# Patient Record
Sex: Male | Born: 1940 | Race: Black or African American | Hispanic: No | Smoking: Former smoker
Health system: Southern US, Community
[De-identification: ages and names within clinical notes are randomized; demographics above are authoritative.]

## PROBLEM LIST (undated history)

## (undated) DIAGNOSIS — I739 Peripheral vascular disease, unspecified: Secondary | ICD-10-CM

## (undated) DIAGNOSIS — F3289 Other specified depressive episodes: Secondary | ICD-10-CM

## (undated) DIAGNOSIS — E7800 Pure hypercholesterolemia, unspecified: Secondary | ICD-10-CM

## (undated) DIAGNOSIS — E559 Vitamin D deficiency, unspecified: Secondary | ICD-10-CM

## (undated) DIAGNOSIS — N401 Enlarged prostate with lower urinary tract symptoms: Secondary | ICD-10-CM

## (undated) DIAGNOSIS — N183 Chronic kidney disease, stage 3 unspecified: Secondary | ICD-10-CM

## (undated) DIAGNOSIS — N138 Other obstructive and reflux uropathy: Secondary | ICD-10-CM

## (undated) DIAGNOSIS — I5032 Chronic diastolic (congestive) heart failure: Secondary | ICD-10-CM

## (undated) DIAGNOSIS — R972 Elevated prostate specific antigen [PSA]: Secondary | ICD-10-CM

## (undated) DIAGNOSIS — I10 Essential (primary) hypertension: Secondary | ICD-10-CM

## (undated) DIAGNOSIS — D649 Anemia, unspecified: Secondary | ICD-10-CM

## (undated) DIAGNOSIS — I69951 Hemiplegia and hemiparesis following unspecified cerebrovascular disease affecting right dominant side: Secondary | ICD-10-CM

## (undated) DIAGNOSIS — I6529 Occlusion and stenosis of unspecified carotid artery: Secondary | ICD-10-CM

## (undated) DIAGNOSIS — K0510 Chronic gingivitis, plaque induced: Secondary | ICD-10-CM

## (undated) DIAGNOSIS — I6990 Unspecified sequelae of unspecified cerebrovascular disease: Secondary | ICD-10-CM

## (undated) DIAGNOSIS — R3129 Other microscopic hematuria: Secondary | ICD-10-CM

## (undated) DIAGNOSIS — E785 Hyperlipidemia, unspecified: Secondary | ICD-10-CM

## (undated) DIAGNOSIS — F329 Major depressive disorder, single episode, unspecified: Secondary | ICD-10-CM

## (undated) HISTORY — PX: CATARACT EXTRACTION: SUR2

## (undated) HISTORY — DX: Other specified depressive episodes: F32.89

## (undated) HISTORY — DX: Chronic diastolic (congestive) heart failure: I50.32

## (undated) HISTORY — DX: Anemia, unspecified: D64.9

## (undated) HISTORY — DX: Elevated prostate specific antigen (PSA): R97.20

## (undated) HISTORY — DX: Benign prostatic hyperplasia with lower urinary tract symptoms: N40.1

## (undated) HISTORY — PX: CORONARY STENT PLACEMENT: SHX1402

## (undated) HISTORY — DX: Hyperlipidemia, unspecified: E78.5

## (undated) HISTORY — DX: Vitamin D deficiency, unspecified: E55.9

## (undated) HISTORY — DX: Chronic kidney disease, stage 3 (moderate): N18.3

## (undated) HISTORY — DX: Pure hypercholesterolemia, unspecified: E78.00

## (undated) HISTORY — DX: Peripheral vascular disease, unspecified: I73.9

## (undated) HISTORY — DX: Other obstructive and reflux uropathy: N13.8

## (undated) HISTORY — DX: Other microscopic hematuria: R31.29

## (undated) HISTORY — DX: Unspecified sequelae of unspecified cerebrovascular disease: I69.90

## (undated) HISTORY — DX: Essential (primary) hypertension: I10

## (undated) HISTORY — DX: Occlusion and stenosis of unspecified carotid artery: I65.29

## (undated) HISTORY — DX: Chronic kidney disease, stage 3 unspecified: N18.30

## (undated) HISTORY — DX: Major depressive disorder, single episode, unspecified: F32.9

## (undated) HISTORY — PX: EYE SURGERY: SHX253

## (undated) HISTORY — DX: Chronic gingivitis, plaque induced: K05.10

---
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
## 1999-11-21 ENCOUNTER — Other Ambulatory Visit: Admission: RE | Admit: 1999-11-21 | Discharge: 1999-11-21 | Payer: Self-pay | Admitting: Urology

## 2006-12-31 ENCOUNTER — Ambulatory Visit: Payer: Self-pay | Admitting: Cardiology

## 2006-12-31 ENCOUNTER — Inpatient Hospital Stay (HOSPITAL_COMMUNITY): Admission: AD | Admit: 2006-12-31 | Discharge: 2007-01-05 | Payer: Self-pay | Admitting: Emergency Medicine

## 2006-12-31 ENCOUNTER — Emergency Department (HOSPITAL_COMMUNITY): Admission: EM | Admit: 2006-12-31 | Discharge: 2006-12-31 | Payer: Self-pay | Admitting: Family Medicine

## 2007-01-02 ENCOUNTER — Encounter (INDEPENDENT_AMBULATORY_CARE_PROVIDER_SITE_OTHER): Payer: Self-pay | Admitting: Neurology

## 2007-01-11 ENCOUNTER — Encounter: Payer: Self-pay | Admitting: Neurology

## 2007-01-25 ENCOUNTER — Inpatient Hospital Stay (HOSPITAL_COMMUNITY): Admission: RE | Admit: 2007-01-25 | Discharge: 2007-01-26 | Payer: Self-pay | Admitting: Interventional Radiology

## 2007-02-01 ENCOUNTER — Encounter: Payer: Self-pay | Admitting: Neurology

## 2007-02-10 ENCOUNTER — Encounter: Payer: Self-pay | Admitting: Interventional Radiology

## 2007-03-02 ENCOUNTER — Inpatient Hospital Stay (HOSPITAL_COMMUNITY): Admission: AD | Admit: 2007-03-02 | Discharge: 2007-03-03 | Payer: Self-pay | Admitting: Interventional Radiology

## 2007-03-16 ENCOUNTER — Encounter: Payer: Self-pay | Admitting: Interventional Radiology

## 2007-05-16 ENCOUNTER — Encounter: Payer: Self-pay | Admitting: Neurology

## 2007-06-04 ENCOUNTER — Encounter: Payer: Self-pay | Admitting: Neurology

## 2007-07-04 ENCOUNTER — Encounter: Payer: Self-pay | Admitting: Neurology

## 2007-07-08 ENCOUNTER — Ambulatory Visit (HOSPITAL_COMMUNITY): Admission: RE | Admit: 2007-07-08 | Discharge: 2007-07-08 | Payer: Self-pay | Admitting: Interventional Radiology

## 2007-08-04 ENCOUNTER — Encounter: Payer: Self-pay | Admitting: Neurology

## 2008-03-20 ENCOUNTER — Ambulatory Visit (HOSPITAL_COMMUNITY): Admission: RE | Admit: 2008-03-20 | Discharge: 2008-03-20 | Payer: Self-pay | Admitting: Interventional Radiology

## 2008-06-19 ENCOUNTER — Ambulatory Visit (HOSPITAL_COMMUNITY): Admission: RE | Admit: 2008-06-19 | Discharge: 2008-06-20 | Payer: Self-pay | Admitting: Ophthalmology

## 2008-12-05 ENCOUNTER — Ambulatory Visit (HOSPITAL_COMMUNITY): Admission: RE | Admit: 2008-12-05 | Discharge: 2008-12-05 | Payer: Self-pay | Admitting: Interventional Radiology

## 2009-01-12 IMAGING — CR DG CHEST 2V
2 series · 2 of 2 positions shown · non-contrast
Comparison: none

CLINICAL DATA: Right hand weakness

Chest 2 view:
Comparison 12/31/2006. There is mild central peribronchial thickening. No
confluent peripheral airspace infiltrate. Heart size normal. No effusion.
Visualized upper abdomen unremarkable. Visualized bones unremarkable.

[w chest pa]
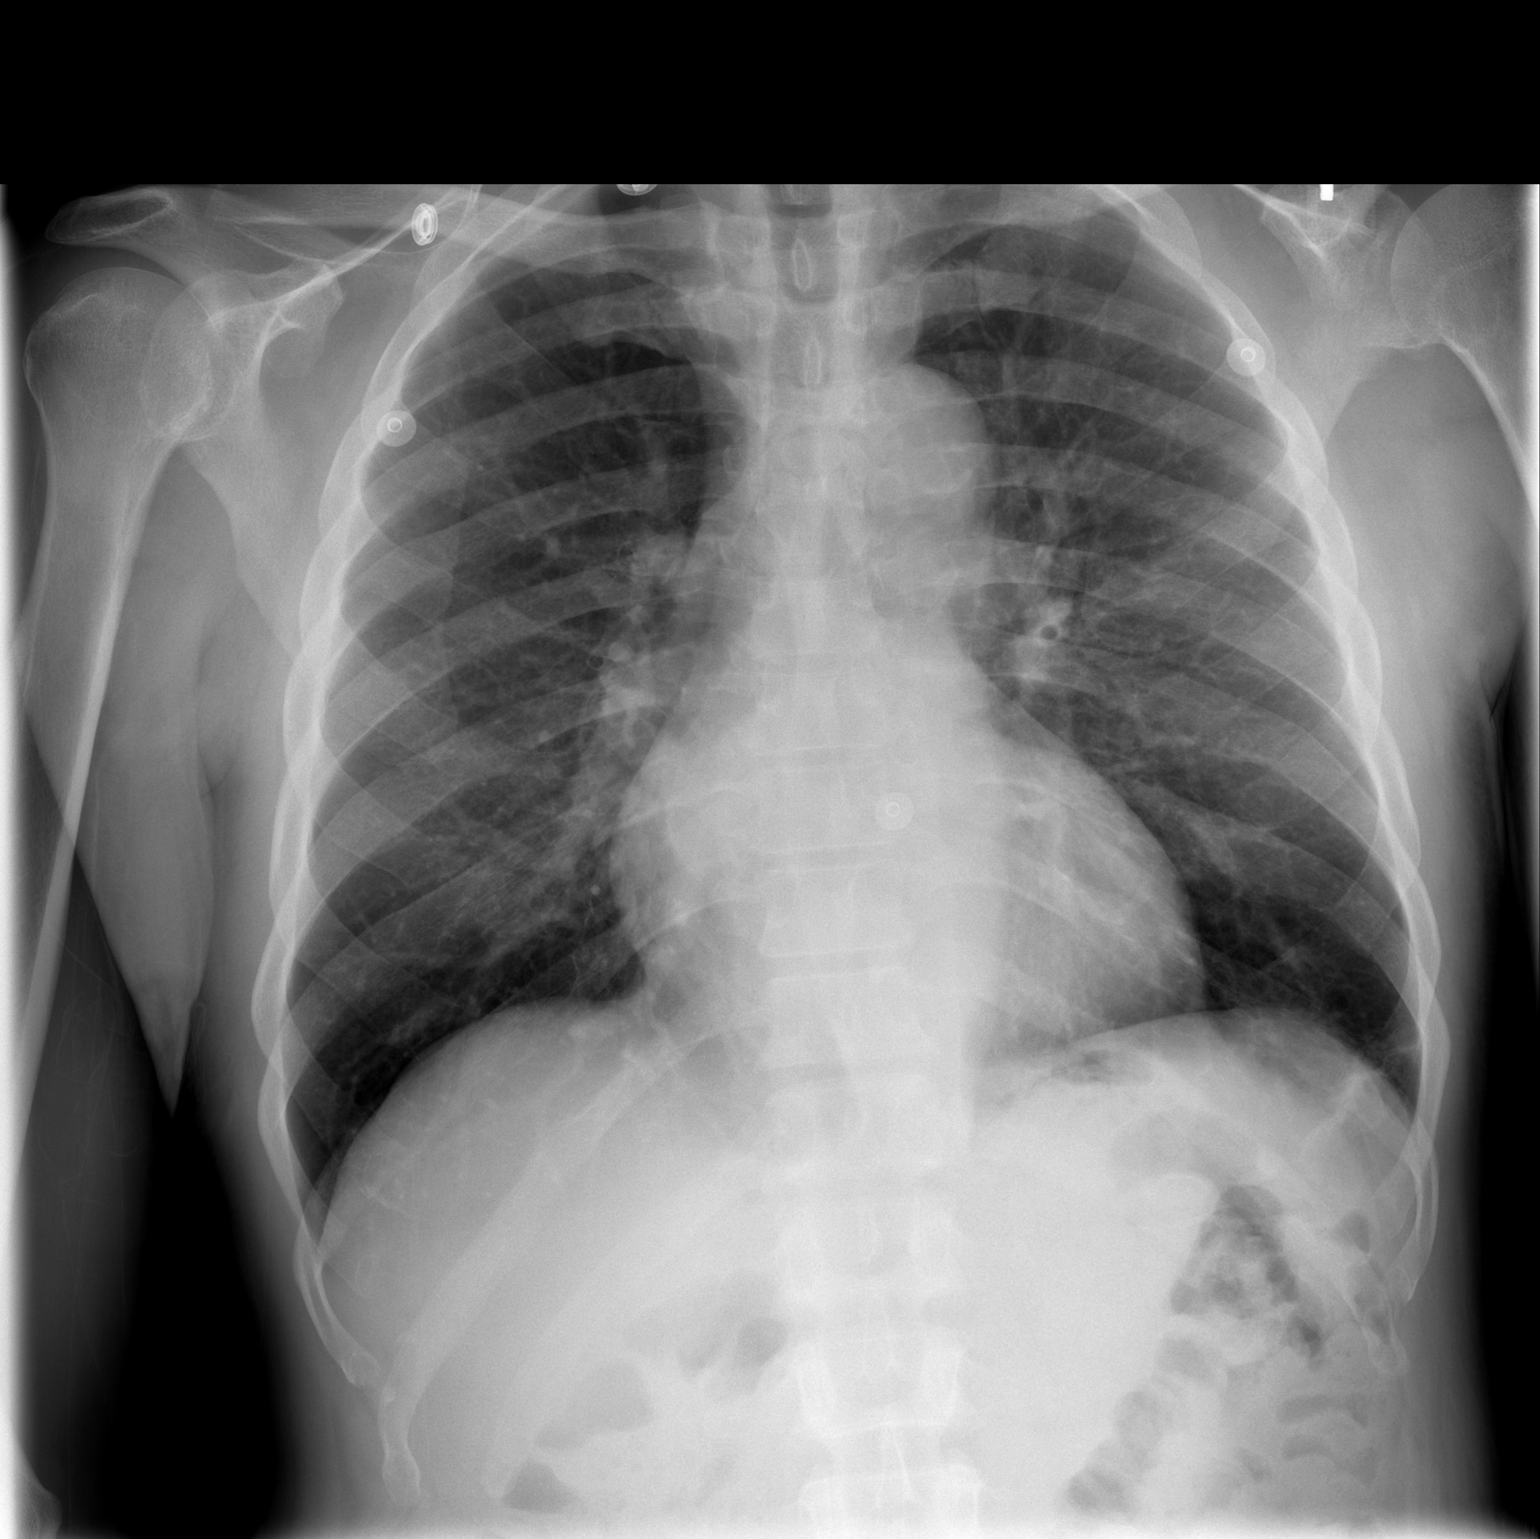

[w chest lat]
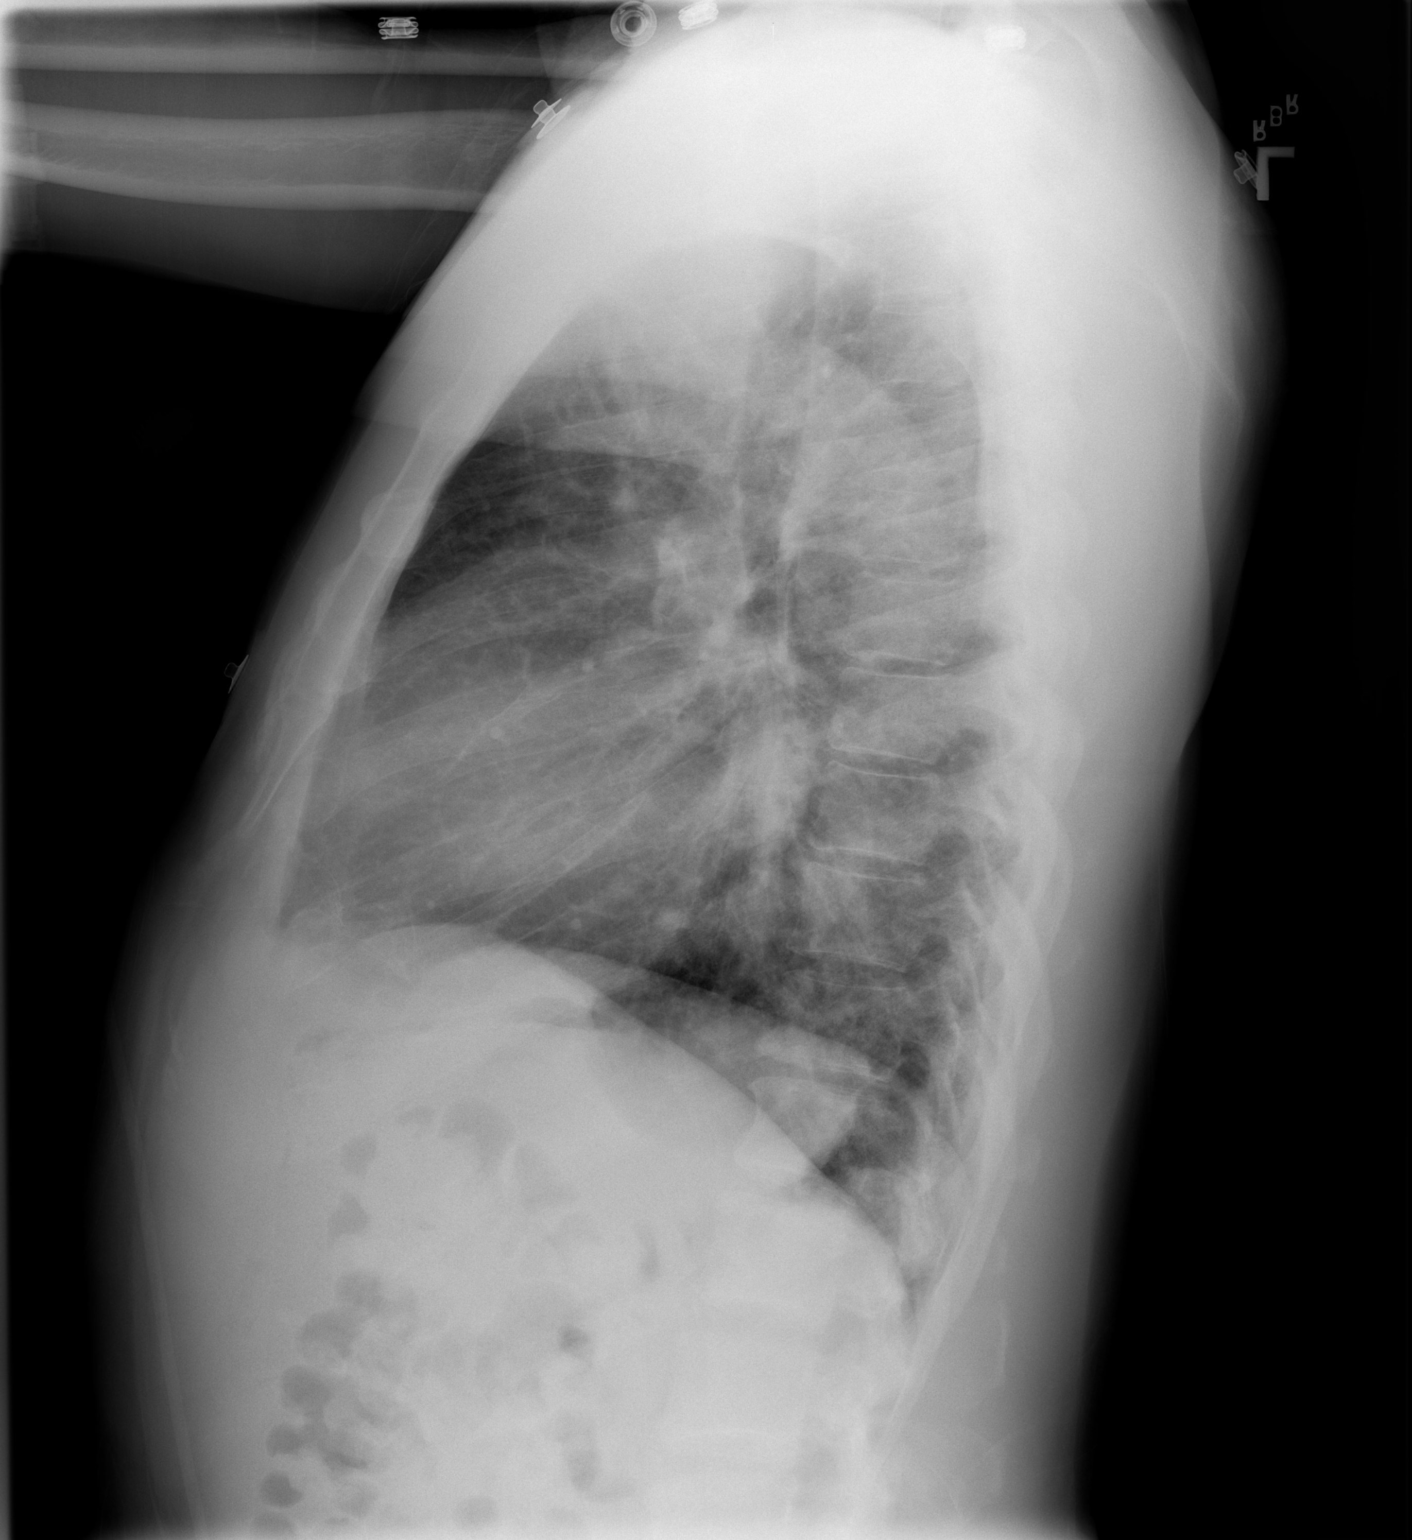

[2 of 2 positions shown; findings below may reference images not displayed]

IMPRESSION: 1. Central peribronchial thickening suggesting bronchitis, asthma, or viral
syndrome.

## 2009-01-12 IMAGING — CT CT CHEST W/ CM
4 of 7 series · 13 of 46 positions shown, 18 images · IV contrast (omnipaque)
Comparison: None relevant.

CLINICAL DATA: Right hand weakness and anemia.   Brain lesion.  Question metastatic disease.
CHEST CT WITH CONTRAST:
TECHNIQUE: Multidetector CT imaging of the chest was performed following the standard protocol during bolus administration of intravenous contrast.
Contrast:  100 cc Omnipaque 300.  Oral contrast was given.
TECHNIQUE: Multidetector CT imaging of the abdomen was performed following the standard protocol during bolus administration of intravenous contrast.
TECHNIQUE: Multidetector CT imaging of the pelvis was performed following the standard protocol during bolus administration of intravenous contrast.

[Series 2: chest/abd/pelvis · axial · 0.82mm/px · z∈[-614,-141]mm · 8 of 150 slices shown]
[im 16/150  soft-tissue]
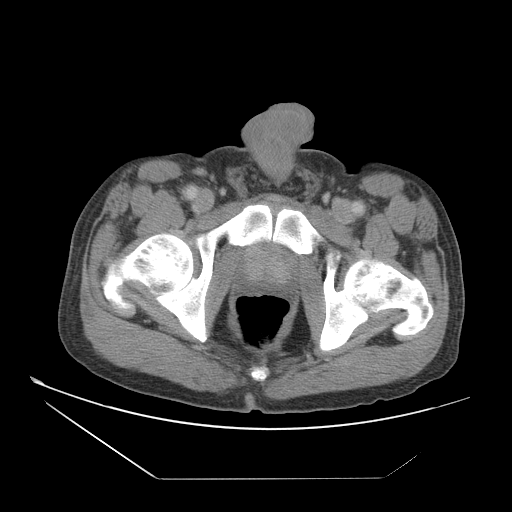
[im 32/150  soft-tissue]
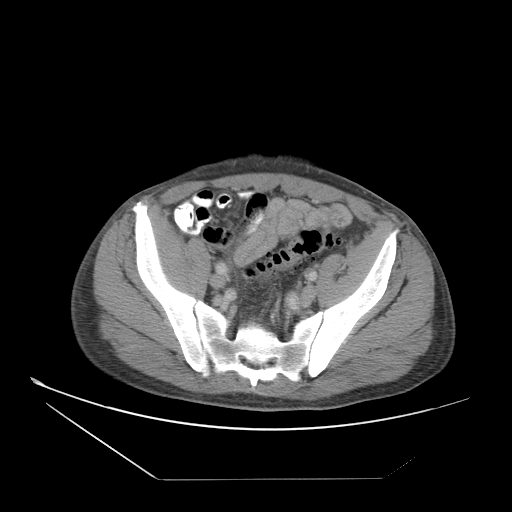
[im 48/150  soft-tissue]
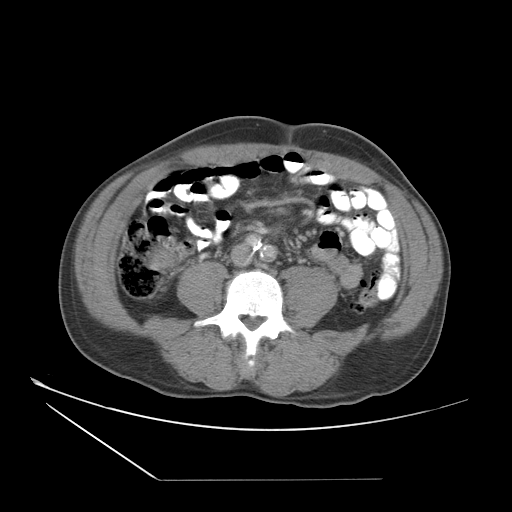
[im 63/150  soft-tissue]
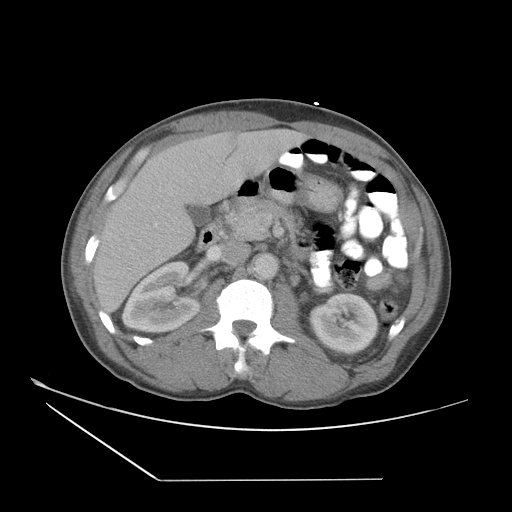
[im 87/150  soft-tissue]
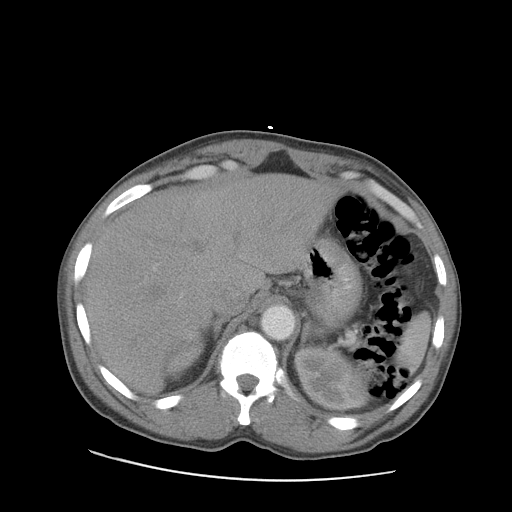
[im 102/150  soft-tissue]
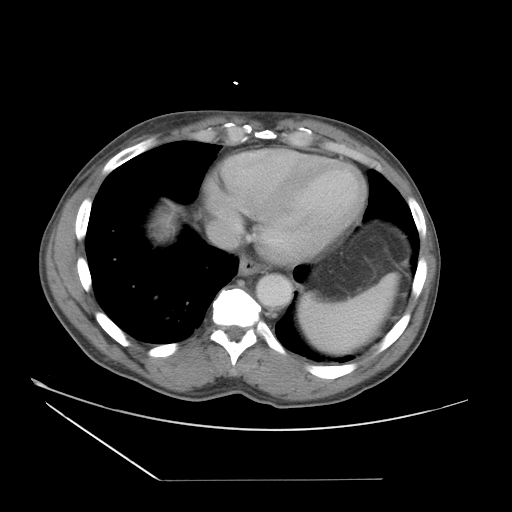
[im 118/150  soft-tissue]
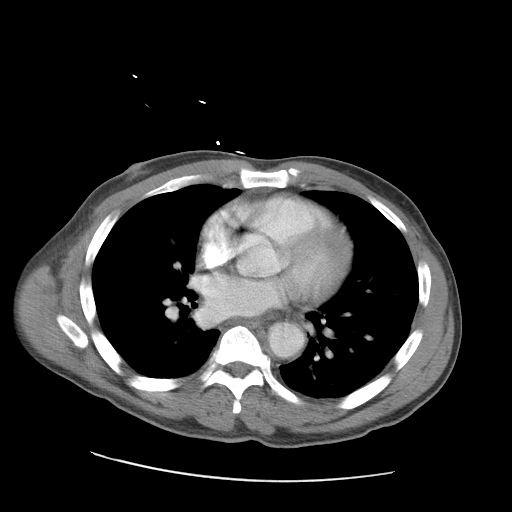
[im 134/150  soft-tissue]
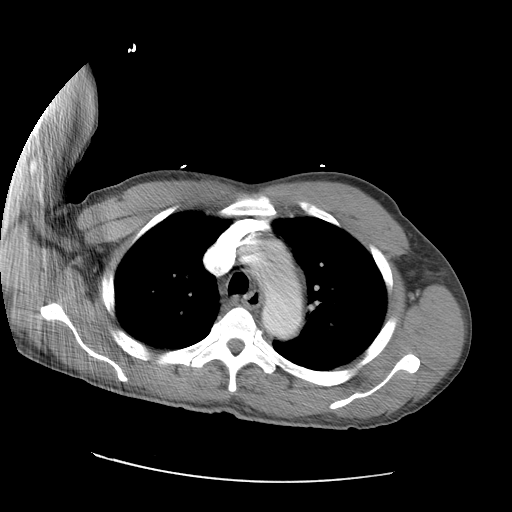

[Series 5: renal delays · axial · 0.70mm/px · z∈[-389,-334]mm · 2 of 34 slices shown, 5 images]
[im 12/34  soft-tissue]
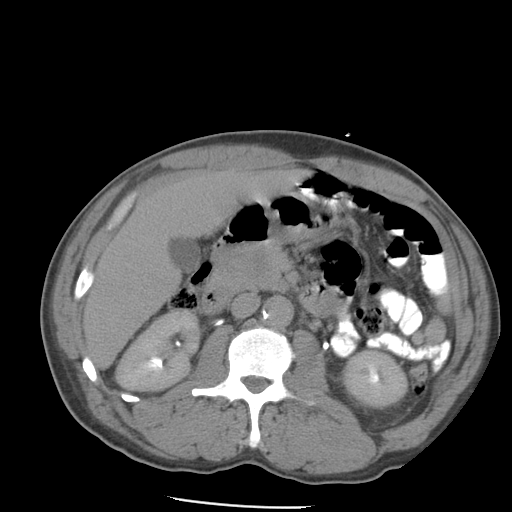
[im 12/34  lung]
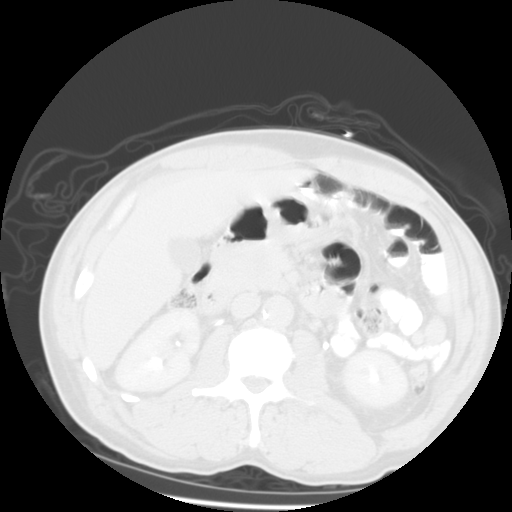
[im 12/34  bone]
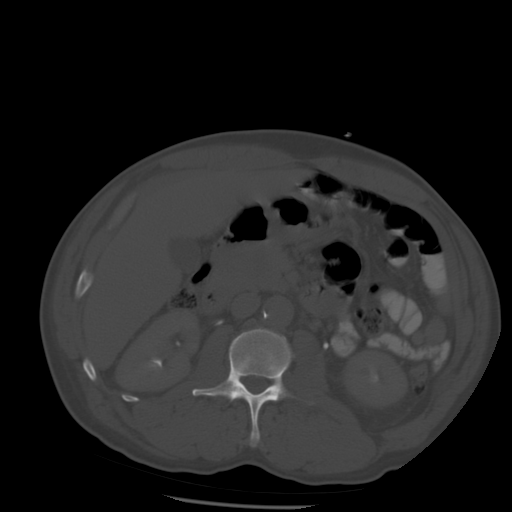
[im 23/34  soft-tissue]
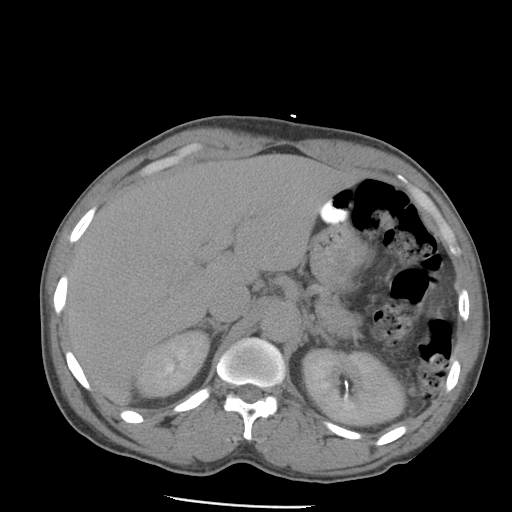
[im 23/34  lung]
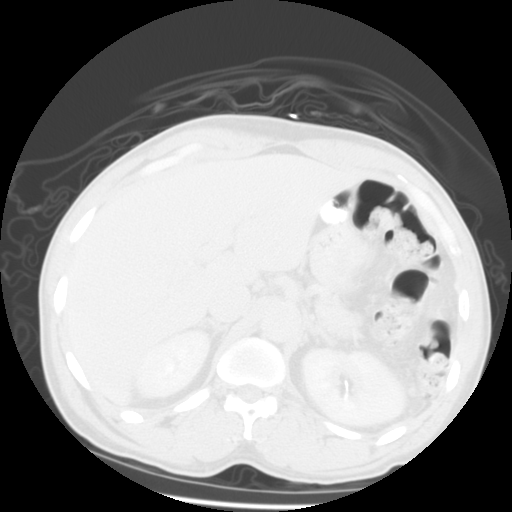

[Series 401: sagittals · sagittal · 0.78mm/px · 2 of 85 slices shown, 3 images]
[im 29/85  soft-tissue]
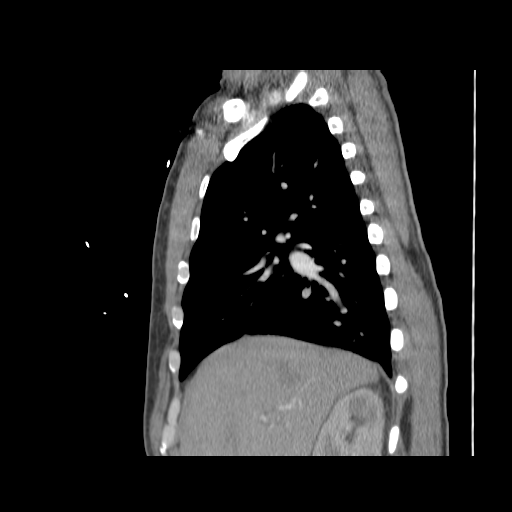
[im 29/85  bone]
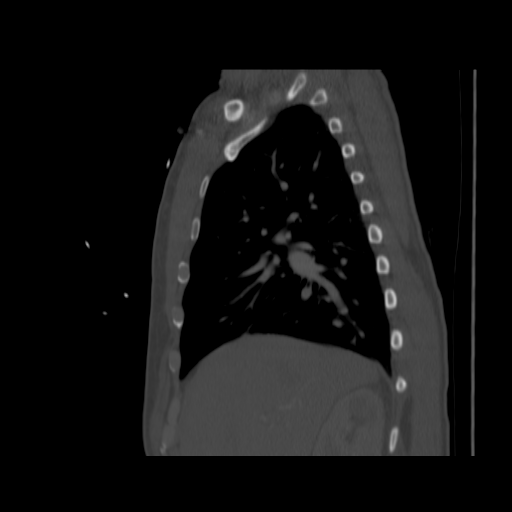
[im 57/85  soft-tissue]
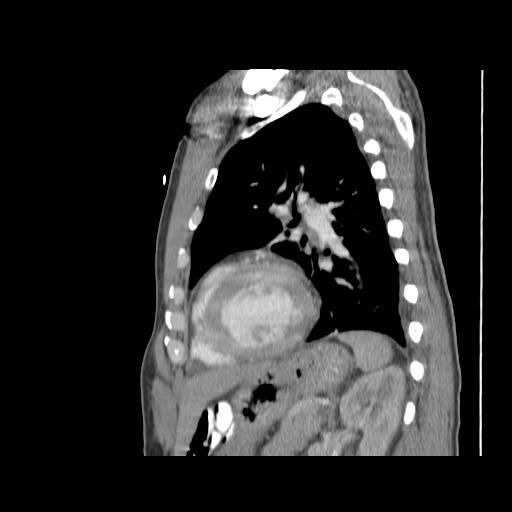

[Series 403: abd sagittals · sagittal · 0.90mm/px · 1 of 87 slices shown, 2 images]
[im 29/87  soft-tissue]
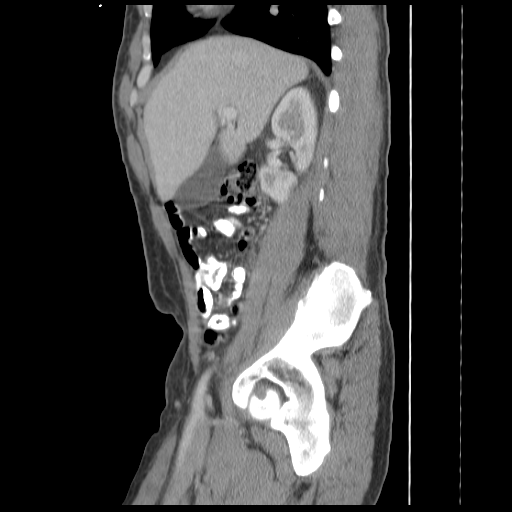
[im 29/87  bone]
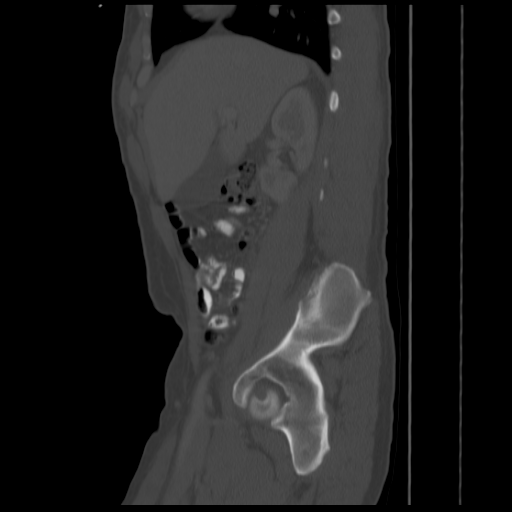

[13 of 46 positions shown; findings below may reference images not displayed]

FINDINGS: There is a prominent AP window node on image #19 with a short axis dimension of 9 mm.  No enlarged mediastinal or hilar lymph nodes are present.  There is no pleural or pericardial effusion.  The lungs are clear aside from mild atelectasis in the left lower lobe.  There is no mass or endobronchial lesion.
IMPRESSION: No evidence of intrathoracic primary tumor or metastatic disease.  Mild left lower lobe atelectasis. 
ABDOMEN CT WITH CONTRAST:
FINDINGS: There is a small hiatal hernia.  There is aortic atherosclerosis without focal aneurysm.  The liver, spleen, gallbladder, pancreas, adrenal glands, and kidneys demonstrate no suspicious findings.  There is no adenopathy or ascites.  A well-circumscribed lucent lesion is noted in the L3 spinous process.  There are no other suspicious osseous findings.
IMPRESSION: 1.  No evidence of intraabdominal primary malignancy or typical metastatic disease.
2.  A nonspecific lucent lesion is present in the L3 spinous process, potentially a metastasis, but probably incidental.  No other osseous abnormalities are seen.  
PELVIS CT WITH CONTRAST:
FINDINGS: No pelvic mass, fluid collection, or inflammatory process is seen.  The prostate gland is moderately enlarged.  A sclerotic lesion in the left iliac wing on image #113 is noted.  There are no lytic lesions or acute osseous findings.
IMPRESSION: 1.  No evidence of intrapelvic malignancy.  
2.  Moderate enlargement of the prostate gland. 
3.  Nonspecific sclerotic lesion in the left iliac wing may reflect a bone island.

## 2009-09-19 ENCOUNTER — Ambulatory Visit (HOSPITAL_COMMUNITY): Admission: RE | Admit: 2009-09-19 | Discharge: 2009-09-19 | Payer: Self-pay | Admitting: Interventional Radiology

## 2010-08-24 ENCOUNTER — Encounter: Payer: Self-pay | Admitting: Interventional Radiology

## 2010-10-22 LAB — CBC
MCHC: 33 g/dL (ref 30.0–36.0)
MCV: 84.8 fL (ref 78.0–100.0)
Platelets: 277 10*3/uL (ref 150–400)
RBC: 4.59 MIL/uL (ref 4.22–5.81)

## 2010-10-22 LAB — BASIC METABOLIC PANEL
BUN: 18 mg/dL (ref 6–23)
CO2: 26 mEq/L (ref 19–32)
Calcium: 9.6 mg/dL (ref 8.4–10.5)
Creatinine, Ser: 1.18 mg/dL (ref 0.4–1.5)
GFR calc Af Amer: >60 mL/min (ref >60)

## 2010-10-22 LAB — PROTIME-INR
INR: 1.02 (ref 0.00–1.49)
Prothrombin Time: 13.3 seconds (ref 11.6–15.2)

## 2010-11-11 ENCOUNTER — Other Ambulatory Visit: Payer: Self-pay | Admitting: Internal Medicine

## 2010-11-11 DIAGNOSIS — I10 Essential (primary) hypertension: Secondary | ICD-10-CM

## 2010-11-11 LAB — BASIC METABOLIC PANEL
BUN: 18 mg/dL (ref 6–23)
CO2: 27 mEq/L (ref 19–32)
Calcium: 9.8 mg/dL (ref 8.4–10.5)
Creatinine, Ser: 1.15 mg/dL (ref 0.4–1.5)
GFR calc non Af Amer: >60 mL/min (ref >60)
Glucose, Bld: 91 mg/dL (ref 70–99)
Sodium: 141 mEq/L (ref 135–145)

## 2010-11-11 LAB — CBC
Hemoglobin: 11.7 g/dL — ABNORMAL LOW (ref 13.0–17.0)
MCHC: 32.5 g/dL (ref 30.0–36.0)
Platelets: 299 10*3/uL (ref 150–400)
RDW: 16.8 % — ABNORMAL HIGH (ref 11.5–15.5)

## 2010-11-11 LAB — PROTIME-INR
INR: 1 (ref 0.00–1.49)
Prothrombin Time: 13.7 seconds (ref 11.6–15.2)

## 2010-11-11 LAB — APTT: aPTT: 60 seconds — ABNORMAL HIGH (ref 24–37)

## 2010-11-12 ENCOUNTER — Ambulatory Visit
Admission: RE | Admit: 2010-11-12 | Discharge: 2010-11-12 | Disposition: A | Discharge status: Home / Self Care | Payer: Medicare Other | Source: Ambulatory Visit | Attending: Internal Medicine | Admitting: Internal Medicine

## 2010-11-12 DIAGNOSIS — I10 Essential (primary) hypertension: Secondary | ICD-10-CM

## 2010-12-16 NOTE — Op Note (Signed)
NAMEGIRISH, [name]               ACCOUNT NO.:  [id number]   MEDICAL RECORD NO.:  [id number]          PATIENT TYPE:  OIB   LOCATION:  5121                         FACILITY:  [facility]   PHYSICIAN:  Chrystie Nose. Zigmund Daniel, M.D. DATE OF BIRTH:  1940/12/14   DATE OF PROCEDURE:  06/19/2008  DATE OF DISCHARGE:                               OPERATIVE REPORT   ADMISSION DIAGNOSIS:  Macular hole, left eye.   PROCEDURES:  Pars plana vitrectomy with membrane peel, retinal  photocoagulation, gas fluid exchange, serum patch, and Perfluoron  propane injection in the left eye.   SURGEON:  Chrystie Nose. Zigmund Daniel, MD   ASSISTANT:  Deatra Ina, MA   ANESTHESIA:  General.   DETAILS:  As usual prep and drape, conjunctival peritomy at 10, 2, and 4  o'clock.  Infusion port at 4 o'clock.  The lighted pick and the cutter  were placed at 10 and 2 o'clock respectively.  Contact lens were  anchored into place at 6 and 12 o'clock.  Provisc placed on the cornea  and the flat contact lens was placed.  Pars plana vitrectomy was begun  just behind the crystalline lens.  Vitreous membranes were encountered  and carefully removed under low suction and rapid cutting.  The  vitrectomy was carried down to the macular surface where a preretinal  membrane was seen.  The lighted pick was used to engage the preretinal  membrane.  The silicone tip suction line was drawn down into the  posterior pole and the Fish strike sign occurred.  Posterior membranes  attached to the retina, and the edge of the macular hole were peeled  with ILM forceps, diamond dusted membrane scraper and silicone tip  suction line, and lighted pick.  Once the posterior hyaloid was removed,  additional vitrectomy was carried out removing membranes from the disk  and the great arcades.  The vitrectomy was carried out of the far  periphery with a 30-degree prismatic lens.  The magnifying contact lens  was repositioned on the eye.  The diamond dusted membrane  scraper was  used to treat the edge of the macular hole and free it from its  attachments to the ILM.  Additional peeling with ILM forceps was  performed.  The indirect ophthalmoscope laser was moved into place, 610  burns placed around the retinal periphery with a power of 400  milliwatts, 1000 microns each and 0.1 seconds each.  A 100% gas fluid  exchange was performed.  The sufficient time was allowed to pass so that  fluid tracked down the walls of the eye and collected in the posterior  segment.  The serum patch and the Perfluoron propane was mixed to 16%.  During this time, additional fluid was removed with a [place] [employer]  brush.  The serum patch was delivered and the C3F8 16% was exchanged for  intravitreal gas.  The instruments were removed from the eye and 9-0  nylon was used to close the sclerotomy sites.  The conjunctiva was  closed with wet-field cautery.  Polymyxin and gentamicin were irrigated  in tenon space.  Atropine solution  was applied.  Marcaine was injected  around the globe for postop pain.  Decadron 10  mg was injected to the lower subconjunctival space.  Closing pressure  was 10 with a Barraquer tonometer.  Polysporin ophthalmic ointment, the  patch, and shield were placed.  The patient was awakened and taken to  recovery in satisfactory condition.  Complications none.  Duration 1  hour.      Chrystie Nose. Zigmund Daniel, M.D.  Electronically Signed     JDM/MEDQ  D:  06/19/2008  T:  06/20/2008  Job:  [id number]

## 2010-12-16 NOTE — H&P (Signed)
[name], [name]               ACCOUNT NO.:  [id number]   MEDICAL RECORD NO.:  [id number]          PATIENT TYPE:  OIB   LOCATION:  3172                         FACILITY:  [facility]   PHYSICIAN:  Sanjeev K. Deveshwar, M.D.DATE OF BIRTH:  [birth year]   DATE OF ADMISSION:  03/02/2007  DATE OF DISCHARGE:                              HISTORY & PHYSICAL   CHIEF COMPLAINT:  Cerebrovascular disease.   HISTORY OF PRESENT ILLNESS:  This is a pleasant 70 year old male who was  admitted to [facility] Dec 31, 2006 to January 05, 2007 with right  sided weakness.  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  He  had a cerebral angiogram performed by Dr. Estanislado Pandy during that  admission that showed a 90 to 95% left internal carotid artery stenosis  as well as a 90% right internal carotid artery stenosis.  The patient  had a 2-D echocardiogram that showed no source of emboli with an  ejection fraction of 55 to 65% and mild aortic regurgitation. He also  had a TEE performed with similar findings.  The patient underwent left  internal carotid artery PTA stenting by Dr. Estanislado Pandy on January 25, 2007.  He was seen in followup on February 10, 2007 and arrangements were made to  have the patient return today, March 02, 2007 for a repeat cerebral  angiogram and possible PTA stenting of the right internal carotid  artery.  These lesions were intracranial lesions in both carotid  arteries.   PAST MEDICAL HISTORY:  Significant for the above-noted CVA's with  bilateral internal carotid artery stenosis intracranially. He has a  history of an ejection fraction of 55 to 65% by 2-D echocardiogram with  mild AR.  He has a history of iron deficiency anemia and weight loss of  uncertain etiology.  He was worked up for malignancy during his initial  hospitalization in May, however, there were no significant findings.  The patient was also noted to have an elevated PSA.  He was seen in  consultation by Dr. Terance Hart  and found to have BPH.  Further work up was  recommended.  I do not believe this has been further evaluated.  The  patient does not have a primary care physician.  He was encouraged to  obtain one but to this point has not succeeded.  The patient had a bone  scan January 03, 2007 that was within normal limits.  He also had an  elevated hemoglobin A1C, an elevated sed rate and elevated PTT of  uncertain etiology.  He was started on antidepressants during his stroke  admission.  He also has a history of dyslipidemia, hypertension, and  disability secondary to chronic back pain.   PAST SURGICAL HISTORY:  The patient has had no major surgeries.   ALLERGIES:  PENICILLIN.   MEDICATIONS:  His medications include aspirin, Plavix,  hydrochlorothiazide, Celexa and Zocor.   SOCIAL HISTORY:  The patient is divorced.  He has two grown children.  He was living in [place] with his girlfriend.  He also has a home in  [place].  He has a  very supportive daughter.  He does not use alcohol  or tobacco.  He has been on disability since 1994.  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   FAMILY HISTORY:  Both parents died in their 51's, cause unknown.   REVIEW OF SYSTEMS:  Is completely negative except for the following:  The patient has had a chronic cough which has been nonproductive.  He  has residual right sided weakness from his stroke, although he feels  this is improving.   LABORATORY DATA:  An INR is 1.2.  PTT is 73.  Hemoglobin 9.9, hematocrit  30.8, white blood cell count 7,000, platelet count 530,000.  BUN 17,  creatinine 0.84.  The potassium is 4.3. GFR greater than 60.  Glucose  158.  Sodium 134.   PHYSICAL EXAMINATION:  GENERAL APPEARANCE:  Physical examination reveals  a 70 year old male seated in a chair in no acute distress.  VITAL SIGNS:  The vital signs are currently pending.  HEENT:  The patient has a very mild oral droop.  His tongue deviates to  the left.  NECK:  Reveals no  bruits.  HEART:  Reveals a regular rate and rhythm with an occasional ectopic.  No murmur was appreciated.  LUNGS:  Decreased but clear.  ABDOMEN:  Nontender.  EXTREMITIES:  Reveal pulses to be weak to absent.  There was no  significant edema.  NEUROLOGICAL:  On mental status exam the patient is alert and oriented  and follows commands.  Cranial nerves II-XII are grossly intact except  for some very mild oral weakness.  Motor strength is 4/5 in the right  upper extremity and 4-5/5 in the right lower extremity.  The left side  is 5/5.  Cerebellar testing was intact, although he had some mild  difficulty with finger-to-nose testing on the right.  His airway is  rated at a 3.  His ASA scale is a 3.   IMPRESSION:  1. Bilateral carotid artery stenosis, status post left internal      carotid artery stent placement performed by Dr. Estanislado Pandy on January 25, 2007.  2. Plans for right internal carotid artery percutaneous transluminal      angioplasty stenting.  3. History of bilateral cerebral infarcts.  4. History of a 2-dimensional echocardiogram revealing no source of      emboli with an ejection fraction of 55 to 65% with  mild aortic      regurgitation.  5. History of anemia of uncertain etiology.  6. History of weight loss.  7. Elevated PTT.  8. Elevated hemoglobin A1C with elevated glucose levels.  9. History of depression.  10.Dyslipidemia.  11.Hypertension.  12.Chronic back pain resulting in disability.  13.Elevated PSA with benign prostatic hypertrophy.  14.Elevated sed rate.  15.PENICILLIN ALLERGY.   PLAN:  As noted, the patient is here for further evaluation of his  cerebrovascular disease.  The plan is to proceed with a cerebral  angiogram under conscious sedation.  Based on these results, the patient  may also undergo PTA stenting of the right internal carotid artery, if  felt to be safe and indicated.      [name], P.A.     ______________________________  [name]. Estanislado Pandy, M.D.    DR/MEDQ  D:  03/02/2007  T:  03/02/2007  Job:  [id number]   cc:   Pramod P. Leonie Man, MD

## 2010-12-16 NOTE — Discharge Summary (Signed)
[name], [name]               ACCOUNT NO.:  [id number]   MEDICAL RECORD NO.:  [id number]          PATIENT TYPE:  INP   LOCATION:  3107                         FACILITY:  [facility]   PHYSICIAN:  Sanjeev K. Deveshwar, M.D.DATE OF BIRTH:  [birth year]   DATE OF ADMISSION:  01/25/2007  DATE OF DISCHARGE:  01/26/2007                               DISCHARGE SUMMARY   CHIEF COMPLAINT:  Status post PTA stenting of a left internal carotid  artery stenosis performed January 25, 2007.   HISTORY OF PRESENT ILLNESS:  This is a 70 year old male who was admitted  to [facility] Dec 31, 2006, to January 05, 2007, with right-sided  weakness.  An MRI was performed and was consistent with bilateral  cerebral infarcts a cerebral angiogram was performed on January 03, 2007, by  Dr. Estanislado Pandy that showed a 90-95% left internal carotid artery stenosis  as well as a 90% right internal carotid artery stenosis.   During the admission, the patient had a 2-D echocardiogram and a TEE  performed.  These showed no source of emboli.  His ejection fraction was  estimated to be 55-65%.  He did have some mild aortic regurgitation.  Also, during that stay, the patient was noted to have iron deficiency  anemia with a recent history of weight loss.  There was concern for  possible occult malignancy.  The patient was also noted to have an  elevated PSA and BPH.  A urology consult was obtained from Dr. Terance Hart  with further evaluation recommended in the future.  The patient had a  bone scan on January 03, 2007, that was within normal limits.  He also had  an elevated hemoglobin A1c.  He reported some depression and was placed  on an antidepressant medication.  He also had an elevated sed rate of  140 and an elevated PTT.   The patient was discharged from [facility] on January 05, 2007.  He was  referred to Dr. Estanislado Pandy through the courtesy of Dr. Leonie Man to undergo  possible stenting of the left internal carotid artery.  The patient  was  admitted to [facility] on January 25, 2007, for the procedure.   PAST MEDICAL HISTORY:  1. Dyslipidemia.  2. History of CVAs as noted above.  3. Outpatient therapies for right upper extremity weakness.  4. Diagnosed as being hypertensive and started on hydrochlorothiazide      during his recent admission.  5. Iron deficiency anemia.  6. History of weight loss.  7. Elevated PTT of uncertain etiology, as well as an elevated sed rate      and PSA.  8. BPH.  9. History of back pain.  He has been on disability for his back pain.  10.He was recently diagnosed with depression and is currently being      treated.   SURGICAL HISTORY:  The patient has had no major surgeries.  He denies  ever having had general anesthesia.   ALLERGIES:  PENICILLIN.   MEDICATIONS ON ADMISSION:  1. Aspirin.  2. Plavix.  3. Hydrochlorothiazide.  4. Celexa.  5. Zocor.  SOCIAL HISTORY:  The patient is divorced.  She has two grown children.  He lives in [place] with his girlfriend.  He also has a home in  [place].  He does not use alcohol or tobacco.  He is on disability  since 1994.  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   FAMILY HISTORY:  Both parents died in their 89s, cause unknown.   HOSPITAL COURSE:  As noted, this patient was admitted to [facility] on January 25, 2007, to undergo treatment of a left internal  carotid artery stenosis.  The patient had a left internal carotid artery  stent placed under general anesthesia by Dr. Estanislado Pandy on the day of  admission.  There were no immediate or known complications.  The patient  was admitted to the neural intensive care unit where he was maintained  on IV heparin overnight.  The following day, the heparin was  discontinued the right femoral groin sheath was removed and hemostasis  was obtained.  Arrangements were made to discharge the patient later  that day in stable and improved condition.   LABORATORY DATA:  The patient was  noted be anemic on the day following  his procedure.  His hemoglobin had dropped to 7.6.  He was transfused.  His hemoglobin improved to 10.5, hematocrit 32.3 prior to discharge.  The patient was also noted to have an elevated platelet count of  468,000.  On the day of discharge, chemistry profile revealed a BUN of  13, creatinine 0.77, potassium was 3.9.  Sodium 133, glucose was 108.  GFR was greater than 60.   The patient was told to resume his medications as previously taken at  time of discharge these included aspirin, Plavix, hydrochlorothiazide,  Celexa and Zocor.   The patient was told to avoid any strenuous activity for at least 2  weeks.  He was not to drive for 2 weeks.  He was given instructions  regarding wound care.  He was to follow-up with Dr. Estanislado Pandy in  approximately 2 weeks.  He was instructed to find a primary care  physician for further evaluation of his medical issues.   PROBLEMS:  At time of discharge:  1. Left internal carotid artery stenosis status post PTA stenting      performed January 25, 2007.  2. Residual right internal carotid artery stenosis to be stented in      the near future.  3. Recent bilateral cerebrovascular accidents.  4. 2-D echo and TEE showing no source of emboli with ejection fraction      of 55-65% and mild AR.  5. History of iron deficiency anemia requiring transfusion this      admission.  6. History of weight loss.  7. History of elevated hemoglobin A1c.  8. Dyslipidemia.  9. Elevated PSA and benign prostatic hypertrophy with further      evaluation recommended.  10.Chronic back pain on disability and hypertension.  11.History of depression.  12.Elevated PTT and elevated sed rate of uncertain etiology.  13.History of allergy to penicillin.  14.Elevated platelet count.  15.History of remote cerebral cerebrovascular accident as well mildly      elevated glucose.      Mikey Bussing, P.A.    ______________________________   Fritz Pickerel. Estanislado Pandy, M.D.    DR/MEDQ  D:  02/02/2007  T:  02/02/2007  Job:  [id number]   cc:   Pramod P. Leonie Man, MD

## 2010-12-16 NOTE — Discharge Summary (Signed)
[name], [name]               ACCOUNT NO.:  [id number]   MEDICAL RECORD NO.:  [id number]          PATIENT TYPE:  EMS   LOCATION:  URG                            FACILITY:   PHYSICIAN:  UNCLEAR                DATE OF BIRTH:  15-Mar-1941   DATE OF ADMISSION:  12/31/2006  DATE OF DISCHARGE:  01/05/2007                               DISCHARGE SUMMARY   DICTATED FOR:  David P. Leonie Man, MD   DIAGNOSIS AT THE TIME OF DISCHARGE:  1. Bilateral cerebellar infarcts, large and small vessel disease, left      greater than right, without etiology, clearly identified, though it      is felt to be secondary to bilateral ICA stenosis greater than 90%      and __________  emboli.  2. 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 Juliet [name].  3. Elevated hemoglobin A1C.  4. Mild dyslipidemia.  5. Self-reported depression.   MEDICINES AT TIME OF DISCHARGE:  1. Aspirin 81 mg a day.  2. Plavix 75 mg a day.  3. Zocor 10 mg a day.  4. Hydrochlorothiazide 12.5 mg a day.  5. Celexa 10 mg a day.   STUDIES PERFORMED:  1. CT of the brain in admission shows a 7-mm area of focal      hypoattenuation, left side of the genu of the corpus callosum.      Unclear whether it is a small mass infection or ischemia.  Ill-      defined soft tissue density over left convexity in the posterior      left frontal lobe, representing possibly a small meningioma.  2. Chest x-ray shows mild cardiomegaly with pulmonary vascular      congestion.  Otherwise, negative.  3. MRI of the brain showed scattered infarctions throughout the      cerebral hemispheres, suggesting embolic disease from an ascending      aortic or cardiac source.  The largest stroke is about 2.5 cm in      size, and affects the left parietal cortex.  4. MRA of the head shows no intracranial vascular occlusion.  There is      loss of signal on both carotid siphons, so it is thought to be      artifactually based.  5. Chest  x-ray shows central peribronchial thickening, suggesting      bronchitis, asthma, or viral syndrome.  6. CT of the chest shows no intrathoracic primary tumor or metastatic      disease.  Mild left lower lobe atelectasis.  7. Abdominal CT shows no intra-abdominal primary malignancy or typical      metastatic disease.  Nonspecific lucent lesion is present in the L3      spinous process.  Potentially a metastasis, but probably      incidental.  Pelvic CT shows no intrapelvic malignancy, moderate      enlargement of the prostate planned.  Nonspecific sclerotic lesion      in the left iliac wing  may reflect a bone island.  8. Bone scan is negative.  9. Cerebral angiogram shows severe 90% to 95% severe segmental      stenosis of 90% of the right RCA and distal cavernous segment.      Partial reconstitution of the MCA from ACA via the ipsilateral      PCOM.  10.A 2-D echocardiogram shows EF of 65%, with aortic valve stenosis.      No obvious embolic source.  11.Carotid Doppler shows no RCA stenosis.  Vertebral flow is antegrade      bilaterally.  12.EKG shows normal sinus rhythm, right bundle-branch block, septal      infarct.   LABORATORY STUDIES:  CBC with hemoglobin on admission 8.0, 8.3 at the  time of discharge.  Hematocrit 25.3, white blood cells 6.7, platelets  505.  MCV is 78.3.  Chemistry with glucose 106, otherwise normal.  Coagulation study is normal.  Liver function tests normal.  Albumin 2.4.  Cardiac enzymes not performed.  Cholesterol 135, HDL 21, LDL 103, and  triglycerides 54.  Urinalysis was negative.  SPE with interpretation  shows total protein 6.6, serum albumin 35.1, alpha1 globulin 8.7, alpha  II globulin 24.9, beta globulin 4.2, beta II globulin 6.3, gamma  globulin 20.8, M-spike not detected.  There is nonspecific, diffuse,  polychromal-type increase in gamma globulin.  PSA was 22.93, sed rate  greater than 140, iron 14, total IBC 210, percent saturation 7, UIBC   196, vitamin B12 337, homocysteine 12.5, PSA initially 26.61, hemoglobin  A1C 6.9.   HISTORY OF PRESENT ILLNESS:  [name] is a 70 year old right-  handed African American married male who was admitted for evaluation of  right arm weakness and abnormal CT scan.  He has a 2-day history of  progressive weakness in his right arm and hand which got much worse this  morning.  He had a headache several days ago, for which he took 1  aspirin, but no recent headache, syncope or seizure.  He denied chest  pain or palpitations.  He notes that he was worse this morning.  He was  living in his girlfriend's house in [place] and drove himself to  [facility] Emergency Room where a CT of the brain showed a possible mass  lesion in the mid genu of the left corpus callosum and a high left  parietal lesion.  He was admitted for further evaluation.  Of note, he  has lost approximately 10 pounds in the past month.  He has no known  history of hypertension, diabetes, heart disease, or drug abuse.  Prior  history of stroke.  He has not been taking aspirin on a regular basis.  He was not a TPA candidate, secondary to time.   HOSPITAL COURSE:  MRI revealed bicerebral acute infarcts in large and  small vessel distributions.  He was found to have bilateral ICAs,  cavernous stenosis greater than 90% bilaterally.  His strokes more were  on the left than the right, and it was decided that he would probably be  a left RCA stent candidate.  He was seen by Dr. Stephani [name], who  performed a cerebral angiogram which confirmed the findings.  Plans are  to schedule him for a left RCA stent in 1-2 weeks.  They will contact  him related to the date and details, after discharge.  Of note, patient  was found to have vascular risk factors of mildly elevated hemoglobin  A1C at 6.9, and mild  dyslipidemia with LDL 103.  He also had blood pressures 120s to 140s over 60s to 90s.  He was placed on low-dose   hydrochlorothiazide and low-dose statin at the time of discharge.  Patient also self-reported depression related to diagnosis, and Celexa  was also started.  He was evaluated by PT and OT and felt he would  benefit from outpatient OT only.  These arrangements were made prior to  his discharge.   CONDITION ON DISCHARGE:  1. The patient with improved right-handed weakness.  2. He is awake, alert, and oriented x3, anxious.  3. No cranial nerve deficits.  4. Right hand weakness with decreased fine motor movement on the right      only.  No lower extremity weakness.   DISCHARGE PLAN:  1. Discharged to home with family.  2. Aspirin and Plavix for stroke prevention.  3. New hydrochlorothiazide.  4. New statin.  5. Will need followup in 4-6 weeks.  6. Patient has been advised to obtain a primary care physician.  7. Plan ICA stent in 1-2 weeks by Dr. Juliet [name] on the left.  8. Follow up with Dr. Antony Contras in 2-3 months.      Burnetta Sabin, N.P.    ______________________________  Laury Axon    SB/MEDQ  D:  01/05/2007  T:  01/05/2007  Job:  [id number]   cc:   Lucina Mellow. Terance Hart, M.D.  Dr. Juliet [name]  David P. Leonie Man, MD

## 2010-12-16 NOTE — H&P (Signed)
[name], [name]               ACCOUNT NO.:  [id number]   MEDICAL RECORD NO.:  [id number]          PATIENT TYPE:  AMB   LOCATION:  SDS                          FACILITY:  [facility]   PHYSICIAN:  Sanjeev K. Deveshwar, M.D.DATE OF BIRTH:  [birth year]   DATE OF ADMISSION:  01/25/2007  DATE OF DISCHARGE:                              HISTORY & PHYSICAL   CHIEF COMPLAINT:  Left internal carotid artery stenosis.   HISTORY OF PRESENT ILLNESS:  This is a 70 year old male who was admitted  to [facility] Dec 31, 2006 to January 05, 2007 with right-sided  weakness. A MRI was performed and was consistent with bilateral cerebral  infarcts. A cerebral angiogram was performed on January 03, 2007 by Dr.  Estanislado Pandy which revealed a 90 to 95% left internal carotid artery  stenosis, as well as a 90% right internal carotid artery stenosis.   During the admission, the patient also had a 2-D echocardiogram and a  TEE performed. These showed no source of emboli. His ejection fraction  was estimated to 55 to 65%. There was some mild aortic regurgitation.   The patient was noted to have an iron-deficiency anemia with a recent  history of weight loss. There was concern for a possible occult  malignancy. He also was noted to have an elevated PSA level and BPH. A  urology consult was obtained from Dr. Terance Hart with further evaluation  recommended in the future. The patient had a bone scan on January 03, 2007  that was within normal limits. He also was noted to have elevated  hemoglobin A1c. He reported some depression and was placed on an  antidepressant medication. He also had an elevated sed rate of 140 and  an elevated PTT. Again, the etiology of these was uncertain.   The patient was discharged from the hospital on January 05, 2007. He was  referred to Dr. Estanislado Pandy through the courtesy of Dr. Leonie Man to undergo  stenting of the left internal carotid artery. The patient returns today  for this procedure.   PAST  MEDICAL HISTORY:  The patient has a history of dyslipidemia. He has  the above noted CVA. He had been undergoing outpatient therapies and has  shown improvement in his right upper extremity weakness. He was  diagnosed as being hypertensive and started on hydrochlorothiazide  during his most recent admission. As noted, he has iron-deficiency  anemia with a history of weight loss and elevated PTT and an elevated  sed rate, all of uncertain etiology. He has had an elevated PSA as well  as PTH. He has chronic back pain. He is on disability for his back pain.  He has depression for which he is currently being treated.   SURGICAL HISTORY:  The patient has had no major surgeries. She denies  ever having general anesthesia.   ALLERGIES:  PENICILLIN.   CURRENT MEDICATIONS:  Aspirin, Plavix, hydrochlorothiazide, Celexa and  Zocor.   SOCIAL HISTORY:  The patient is divorced. He has 2 grown children. This  patient lives in [place] with his girlfriend. He also has a home in  [place].  He does not use alcohol or tobacco. He is on disability  since 1994. 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   FAMILY HISTORY:  Both his parents died in their 50s, cause unknown.   REVIEW OF SYSTEMS:  Completely negative except for residual right upper  extremity weakness from his recent CVA.   LABORATORY DATA:  INR was 1.1, PTT was 75. Hemoglobin 9.4, hematocrit  29.6, WBCs 8000, platelets 555,000. BUN 21, creatinine 0.98. GFR greater  than 60. Potassium is 3.8, glucose 105. During his most recent  admission, hemoglobin has been 8.3, hematocrit 25.3, platelets 505,000.  PTT 63.   PHYSICAL EXAMINATION:  Reveals a 70 year old African-American male in no  acute distress.  VITAL SIGNS:  Blood pressure 130/60, pulse 68, respirations 18,  temperature 97.9.  HEENT:  Unremarkable except for the fact that he is missing multiple  teeth.  NECK:  Reveals bilateral bruits versus murmur radiation.  HEART:  Reveals  regular rate and rhythm with a [id number] systolic murmur.  LUNGS:  Clear.  ABDOMEN:  Soft, nontender.  EXTREMITIES:  Revealed pulses to be week to absent. There is no edema.  His airway was rated as a 3. His ASA scale was rated as a 3.  NEUROLOGIC EXAM:  Mental status:  The patient is alert and oriented and  follows commands. Cranial nerves II-XII were grossly intact. Sensation  is intact to light touch. Motor strength is 5/5 except for the right  upper extremity which is 3/5. Cerebellar testing is intact on the left.   IMPRESSION:  1. Bilateral carotid artery stenosis with plans for left internal      carotid artery PTA stent.  2. Recent bilateral cerebral vascular accidents.  3. Two-D echocardiogram as well as transesophageal echocardiogram      during his most recent admission showing no embolic source with      mild aortic regurgitation, ejection fraction 55 to 65%.  4. History of iron-deficiency anemia of uncertain etiology.  5. History of weight loss.  6. History of elevated hemoglobin A1c.  7. Dyslipidemia.  8. Elevated PSA with benign prostatic hypertrophy, recently evaluated      by Dr. Hessie Diener.  9. Chronic back pain, currently on disability.  10.Hypertension.  11.Depression.  12.Elevated PTT and elevated sed rate of unknown etiology.  13.ALLERGY TO PENICILLIN.  14.Elevated platelet count.  15.Remote cerebral vascular accident.   PLAN:  As noted, the patient will undergo PTA stenting of the left  internal carotid artery today to be performed by Dr. Estanislado Pandy under  general anesthesia. He will need PTCA stenting of the right internal  carotid artery at some point in the near future.      Mikey Bussing, P.A.    ______________________________  Fritz Pickerel. Estanislado Pandy, M.D.    DR/MEDQ  D:  01/25/2007  T:  01/25/2007  Job:  [id number]   cc:   Pramod P. Leonie Man, MD

## 2010-12-16 NOTE — H&P (Signed)
[name], [name]               ACCOUNT NO.:  [id number]   MEDICAL RECORD NO.:  [id number]          PATIENT TYPE:  EMS   LOCATION:  URG                          [facility]   PHYSICIAN:  Alyson Locket. Love, M.D.    DATE OF BIRTH:  12-24-1940   DATE OF ADMISSION:  12/31/2006  DATE OF DISCHARGE:                              HISTORY & PHYSICAL   This is the first [facility] admission for this 70 year old  right-handed black divorced male admitted for evaluation of right arm  weakness and abnormal CT brain scan.   HISTORY OF PRESENT ILLNESS:  Mr. Heyser has a 2-day history of  progressive weakness in his right arm and hand which got much worse this  morning.  There was a headache several days ago for which he took one  aspirin but no recent headache, syncope or seizure.  He denies chest  pain or palpitations.  He noticed that he was worse this morning.  He  was living in his girlfriend's house in [place] and drove himself to  [facility] emergency room.  A CT scan of brain showed evidence  of a mass lesion, small in the genu of the left corpus callosum  and a  high left parietal lesion and he is admitted for further evaluation.  He  has lost approximately 10 pounds in the past month.  He has no known  history of hypertension, diabetes mellitus, heart disease, drug abuse or  history of stroke.  He has not been on aspirin on a regular basis.   PAST MEDICAL HISTORY:  Significant low back pain for which has been on  disability since 1995, and an allergy to PENICILLIN.   SOCIAL HISTORY:  He is divorced.  He lives with a girlfriend in  [place] but has a home residence in [place].  His girlfriend's  name is Rocco Serene.  He does not smoke cigarettes.  He does not  drink alcohol.  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  He has two children, a  son 29 and daughter 39 living and well.   FAMILY  HISTORY:  His mother died at 28 and his father at 76 of unknown  causes. He has a brother 19 living and well.   MEDICATIONS:  The patient is currently on no medications.   PHYSICAL EXAMINATION:  Well-developed thin black male, blood pressure  right and left arm 160/80, heart rate was 64, no bruits were heard.  He  was afebrile.  Mental status is alert, oriented times.  His cranial  nerve examination revealed visual fields full.  Disks flat.  Extraocular  movements full.  Corneals present.  No seventh nerve palsy.  Hearing  decreased, air conduction greater than bone conduction.  Tongue midline.  Uvula midline.  Gags present.  External  cleidomastoid trapezius testing  normal.  Motor examination 5/5 strength except 4/5 in the right arm with  diffuse weakness in the right hand in the 3/5 range.  He can touch each  finger to  this thumb.  He could open and close his right hand slightly.  He has intact to pinprick, touch, joint position and vibration testing.  Deep tendon reflexes  were 2+.  Plantar responses downgoing.  General  examination revealed the tympanic membranes to be clear.  Mouth was in  poor repair.  Lungs were clear.  He had a heart murmur at the apex.  He  had a left supraclavicular bruit.  Bowel sounds were normal.  There is  no enlargement of liver, spleen or kidneys.   CT scan showed left parietal lesion and a questionable mass lesion in  the left  genu of the corpus callosum.   LABORATORY DATA:  White blood cell count 6001, hemoglobin 8.0,  hematocrit  24.5, platelets 504K.  Sodium 136, potassium 3.9, chloride  101, CO2 content 25, BUN eight, creatinine 0.81, glucose 108.   IMPRESSION:  1. Two brain stem lesions, rule out metastatic disease, code 794.0.  2. Right arm weakness secondary to left parietal lesion, code 344.4.  3. Anemia.  Concern is that the patient may have an underlying      malignancy.   PLAN:  Plan at this time is to obtain an MRI, Chest X-Ray and CT  scan of  the chest thank.           ______________________________  Alyson Locket. Erling Cruz, M.D.     JML/MEDQ  D:  12/31/2006  T:  12/31/2006  Job:  [id number]

## 2010-12-16 NOTE — Consult Note (Signed)
[name], [name]               ACCOUNT NO.:  [id number]   MEDICAL RECORD NO.:  [id number]          PATIENT TYPE:  OUT   LOCATION:  XRAY                         FACILITY:  [facility]   PHYSICIAN:  Mikey Bussing, P.A.   DATE OF BIRTH:  September 12, 1940   DATE OF CONSULTATION:  02/10/2007  DATE OF DISCHARGE:                                 CONSULTATION   DATE OF CONSULT:  February 10, 2007.   BRIEF HISTORY:  This is a very pleasant 70 year old male who was  admitted to the [facility] on Dec 31, 2006 to January 05, 2007 with  right-sided weakness.  The patient was found to have bilateral cerebral  infarcts.  He had a cerebral angiogram performed by Dr. Estanislado Pandy during  that admission that showed a 90-95% left internal carotid artery  stenosis as well as a 90% right internal carotid artery stenosis.  He  also had a 2-D echo that showed an ejection fraction of 55-65% with mild  aortic regurgitation.  There was no source of emboli.  The patient also  had a TEE performed with similar findings.  The patient eventually  underwent left internal carotid artery PTA stenting performed by Dr.  Estanislado Pandy on January 25, 2007.  He returns today accompanied by his  daughter to be seen in follow-up.   PAST MEDICAL HISTORY:  Significant for the above CVAs with bilateral  internal carotid artery stenosis, a history of an ejection fraction of  55-65% by echo with mild AR.  He also has iron deficiency anemia and  weight loss of uncertain etiology.  He was worked up for malignancy.  During his initial hospital admission in May, however, there were no  significant findings.  The patient also had an elevated prostatic  specific antigen.  He was seen in consultation by Dr. Terance Hart and found  to have BPH.  Further workup was recommended.  The patient had a bone  scan January 03, 2007 that was within normal limits.  He also had an  elevated hemoglobin A1c, an elevated sed rate and an elevated PTT of  uncertain etiology.   He was started on antidepressant during that  admission as well.  He also has a history of dyslipidemia, hypertension  and chronic back pain.  He is on disability secondary to his back pain.   SURGICAL HISTORY:  The patient has had no major surgeries.   ALLERGIES:  PENICILLIN.   MEDICATIONS:  Include aspirin, Plavix, hydrochlorothiazide, Celexa and  Zocor.   SOCIAL HISTORY:  The patient is divorced.  He has two grown children.  He was living in [place] with his girlfriend.  He also has a home in  [place].  He has a very supportive daughter who accompanies him  today.  He does not use alcohol or tobacco.  He has been on disability  since 1994.  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   FAMILY HISTORY:  Both parents died in their 39s, cause unknown.   IMPRESSION AND PLAN:  As noted, the patient returns today following his  left internal carotid artery stent placed  January 25, 2007.  He has a  residual right internal carotid artery stenosis, and the plan is to  perform another intervention sometime in the near future.   The patient reports that he is feeling stronger.  He had been  participating in outpatient physical therapy.  However, this apparently  is temporarily on hold.  He had a history of tobacco use.  He is no  longer smoking.  Dr. Estanislado Pandy also told him to avoid secondhand smoke.  The patient's daughter had questions about his cholesterol, and Dr.  Estanislado Pandy recommended a low-cholesterol diet.  He is also on a statin at  this time.   The patient has been scheduled to return Wednesday, March 02, 2007 for  PTA stenting of the residual right internal carotid artery stenosis.  He  is to remain on all his medications including his aspirin and Plavix.   Greater than 15 minutes was spent on this consult.   The patient questioned whether or not he could drive.      Mikey Bussing, P.A.     DR/MEDQ  D:  02/10/2007  T:  02/11/2007  Job:  [id number]   cc:   Pramod P.  Leonie Man, MD

## 2010-12-16 NOTE — Consult Note (Signed)
[name], [name]               ACCOUNT NO.:  [id number]   MEDICAL RECORD NO.:  [id number]          PATIENT TYPE:  OUT   LOCATION:  XRAY                         FACILITY:  [facility]   PHYSICIAN:  Sanjeev K. Deveshwar, M.D.DATE OF BIRTH:  07-02-1941   DATE OF CONSULTATION:  03/16/2007  DATE OF DISCHARGE:                                 CONSULTATION   CHIEF COMPLAINT:  Cerebrovascular disease.   HISTORY OF PRESENT ILLNESS:  This is a very pleasant 70 year old male  who was admitted to [facility] from Dec 31, 2006 through January 05, 2007 with right-sided weakness.  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  A cerebral angiogram was performed by Dr. 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  A 2-  D echo was performed that showed no source of emboli with an ejection  fraction of 55-65% with mild aortic regurgitation.  A TEE was also  performed that revealed similar findings.   The patient later returned to [facility] and underwent PTA  stenting of the left internal carotid artery performed by Dr. Estanislado Pandy  on January 25, 2007.  He returned once again to [facility] on March 02, 2007 and underwent PTA stenting of the right internal carotid  artery, again performed by Dr. Estanislado Pandy.  These lesions were both  intracranial.  There were no immediate or known complications.  The  patient returns today accompanied by his daughter to be seen in follow  up.   PAST MEDICAL HISTORY:  1. Significant for the above-noted CVAs with bilateral internal      carotid artery stenosis status post PTA stenting of both lesions.      He has an ejection fraction of 55-65%.  He was noted to have an      iron deficiency anemia and weight loss during his initial admission      for his CVA.  He was worked up for an occult malignancy; however,      there were no significant  findings.  During his admission for his      second PTA stent, his hemoglobin dropped to the 7 range, and he did      require transfusion of 2 units of packed red blood cells.  Again,      the etiology of the anemia is uncertain.   The patient was found to have an elevated PSA during his initial  admission.  He was seen in consultation by Dr. Terance Hart and apparently  is being followed for BPH.  The patient had a bone scan on January 03, 2007  that apparently was normal.  He had an elevated hemoglobin A1c and an  elevated sedimentation rate, as well as an elevated PTT, all of  uncertain etiology.  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  He has a history  of dyslipidemia, hypertension, and he has been on disability secondary  to chronic back pain.  I believe he also had an elevated homocysteine  level.   PAST SURGICAL HISTORY:  The patient has had no major surgeries.   ALLERGIES:  PENICILLIN.   CURRENT MEDICATIONS:  1. Aspirin.  2. Plavix.  3. Hydrochlorothiazide.  4. Celexa.  5. Zocor.   SOCIAL HISTORY:  The patient is divorced.  He is two grown children.  He  was living in [place] with his girlfriend.  He also has a home in  [place].  He has a very supportive daughter.  He is currently living  with his daughter.  He does not use alcohol or tobacco.  He has been on  disability secondary to back problems since 1994.  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  The patient would like to begin driving  again.  It was recommended that he re-take his driver's test prior to  driving secondary to his recent CVA.   FAMILY HISTORY:  Both parents died in their 85s, the causes unknown.   IMPRESSION AND PLAN:  As noted, the patient returns today to be seen in  follow up after undergoing a right internal carotid artery PTA stent on  March 02, 2007, as well as a left internal carotid artery PTA stent on  January 25, 2007.  The  patient has been living with his daughter.  He has  been fairly sedentary although he was told not to do anything strenuous  for at least 2 weeks after his interventions.  He reports that he has  been eating well and gaining some weight.  He denies any dark or tarry  stools.  He denies any dizziness, any new weakness or numbness of the  extremities.  He denies any visual changes.  The patient is able to walk  household distances independently.  Overall, he appears be doing well.  He was encouraged to continue on his aspirin and Plavix.   It had been recommended that he find a primary care physician to help  address some of his medical issues.  Apparently, he has an appointment  with Dr. Sabra Heck on April 21, 2007.  He did not know Dr. Ammie Ferrier  first name, but apparently Dr. Sabra Heck is on [facility].  Dr. Estanislado Pandy  felt that it would be important to have the patient's anemia worked up.   We plan to repeat a cerebral angiogram in approximately 4 months to  further assess the patency of his stents.  The patient was told to call  in the meantime for any new neurologic symptoms.   Greater than 15 minutes was spent on this follow up visit.      Mikey Bussing, P.A.    ______________________________  Fritz Pickerel. Estanislado Pandy, M.D.    DR/MEDQ  D:  03/16/2007  T:  03/17/2007  Job:  [id number]   cc:   Pramod P. Leonie Man, MD  Dr. Sabra Heck

## 2010-12-16 NOTE — Discharge Summary (Signed)
[name], [name]               ACCOUNT NO.:  [id number]   MEDICAL RECORD NO.:  [id number]          PATIENT TYPE:  INP   LOCATION:  3108                         FACILITY:  [facility]   PHYSICIAN:  Sanjeev K. Deveshwar, M.D.DATE OF BIRTH:  [birth year]   DATE OF ADMISSION:  03/02/2007  DATE OF DISCHARGE:  03/03/2007                               DISCHARGE SUMMARY   CHIEF COMPLAINT:  Cerebrovascular disease.   HISTORY OF PRESENT ILLNESS:  This is a pleasant 70 year old male who was  admitted [facility], May 30-January 05, 2007, with right-sided  weakness.  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  The  patient had a cerebral angiogram performed by Dr. Estanislado Pandy that showed  a 90-95% left internal carotid artery stenosis as well as a 90% right  internal carotid artery stenosis.  The patient had a 2-D echo that  showed no source of emboli with an ejection fraction of 55-65% and mild  aortic regurgitation.  He also had a TEE performed that revealed similar  findings.   The patient underwent left internal carotid artery PTA stenting by Dr.  Estanislado Pandy on January 25, 2007.  He was seen in follow-up February 10, 2007, and  arrangements were made to have the patient return today, March 02, 2007,  for a repeat cerebral angiogram and possible PTA stenting of the right  internal carotid artery.  These lesions were both intracranial.   PAST MEDICAL HISTORY:  Significant for the above noted CVAs with  bilateral internal carotid artery stenosis intracranially.  He has an  ejection fraction of 55-65% by echocardiogram.  He has history of iron  deficiency anemia and weight loss of uncertain etiology.  He was worked  up for malignancy during his initial hospitalization in May; however,  there were no significant findings.  The patient was also noted have an  elevated PSA.  He was seen in consultation by Dr. Terance Hart and found to  have BPH.  Apparently he has followed up with Dr. Terance Hart since  that  time in his office as well.   The patient does not have a primary care physician.  He has been  encouraged to find one.  He had a bone scan January 03, 2007, that was  normal.  He was noted to have an elevated hemoglobin A1c, an elevated  sedimentation rate and an elevated PTT of uncertain etiology.  He was  felt to be depressed following his stroke  and he was placed on  antidepressants by the neurology service.  He has a history of  dyslipidemia, hypertension, and disability secondary to chronic back  pain.   PAST SURGICAL HISTORY:  The patient has had no major surgeries.   ALLERGIES:  PENICILLIN.   MEDICATIONS ON ADMISSION:  Aspirin, Plavix, hydrochlorothiazide, Celexa  and Zocor.   SOCIAL HISTORY:  The patient is divorced.  She has two grown children.  He was living in [place] with his girlfriend.  He also has a home in  [place].  He has a very supportive daughter.  He does not use alcohol  or tobacco.  He has  been on disability secondary to back problems since  1994.  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   FAMILY HISTORY:  Both parents died in their 3s.  The cause was unknown.   HOSPITAL COURSE:  As noted, this patient was admitted to [facility] on March 02, 2007, to undergo further evaluation of  cerebrovascular disease.  He had a cerebral angiogram performed on the  day of admission.  He was found to have a significant stenosis of the  right internal carotid artery as previously documented.  The patient  underwent PTA stenting of that lesion performed by Dr. Estanislado Pandy under  general anesthesia.  There were no known or immediate complications.  The patient was admitted to the neuro intensive care unit following the  procedure.   He remained on IV heparin overnight.  The following day the heparin was  discontinued, the right femoral groin sheath was removed, and the  patient was placed on bedrest for 6 hours.  He remained stable from a  neurological  standpoint.   The patient was noted to be anemic at time of admission.  He has a  history of anemia of uncertain etiology.  His hemoglobin dropped further  following his intervention.  He was transfused 2 units of packed red  blood cells.  The following day his hemoglobin had improved to the  approximate level at time of admission.   The patient's blood pressure remained under good control.  He had no new  neurological symptoms noted.  Arrangements were eventually made to  discharge the patient later that evening.   LABORATORY DATA:  On the day of discharge hemoglobin was 9.6, hematocrit  was 29, WBC was 4.9 thousand.  His platelet count was 290,000.  On July  30 at approximately 1 p.m. his hemoglobin was 7.8, his hematocrit was  23.  On July 24 hemoglobin was 9.9, hematocrit was 30.8.  A chemistry  profile on the day of discharge revealed BUN 15, creatinine 0.72, GFR  was greater than 60, sodium was 134, potassium 4.2, glucose was 123.  A  PTT on the 24th was noted to be elevated at 73.  As noted this patient  has a history of an elevated PTT of uncertain etiology.   DISCHARGE INSTRUCTIONS:  The patient was told to resume his medications  as previously taken, which did include aspirin and Plavix, as well as  the other medications listed above.  The patient stated that he was  about to run out of his medications.  He was given new prescriptions  with three refills on each.  Again, he was encouraged to find a primary  care physician to follow him for his multiple medical issues.   The patient was told to stay on a low-sodium, low-glucose diet.  He was  told not to do anything strenuous for at least 2 weeks.  A follow-up  appointment was made to see Dr. Estanislado Pandy back in the office in  approximately 2 weeks.   PROBLEM LIST AT TIME OF DISCHARGE:  1. Status post percutaneous transluminal angioplasty stent of the      right internal carotid artery intracranially, performed on the day       of admission, March 02, 2007, with no immediate or known      complications.  2. Status post left internal carotid artery percutaneous transluminal      angioplasty stent performed January 25, 2007. by Dr. Estanislado Pandy.  3. History of bilateral cerebral  infarcts with residual right-sided      weakness.  4. History of a 2-D echocardiogram as well as a transesophageal      echocardiogram revealing an ejection fraction of 55-65% with mild      aortic regurgitation.  5. Anemia of uncertain etiology requiring transfusion this admission.  6. History of weight loss.  7. Elevated PTT of uncertain etiology.  8. Elevated hemoglobin A1c.  9. History of depression.  10.Dyslipidemia.  11.Hypertension.  12.Chronic back pain resulting in disability.  13.An elevated prostate-specific antigen evaluated by Dr. Terance Hart.  14.An elevated sedimentation rate.  15.PENICILLIN allergy.      Mikey Bussing, P.A.    ______________________________  Fritz Pickerel. Estanislado Pandy, M.D.    DR/MEDQ  D:  03/04/2007  T:  03/04/2007  Job:  [id number]   cc:   Pramod P. Leonie Man, MD

## 2010-12-16 NOTE — Consult Note (Signed)
[name], [name]               ACCOUNT NO.:  [id number]   MEDICAL RECORD NO.:  [id number]          PATIENT TYPE:  INP   LOCATION:  3015                         FACILITY:  [facility]   PHYSICIAN:  Lucina Mellow. Terance Hart, M.D.DATE OF BIRTH:  1941/05/19   DATE OF CONSULTATION:  01/01/2007  DATE OF DISCHARGE:                                 CONSULTATION   HISTORY OF PRESENT ILLNESS:  I was asked to see this 70 year old  gentleman by Dr. Erling Cruz for evaluation of a PSA of 26.  This PSA is done  to look for underlying malignancies that might explain why this 16-year-  old man had a stroke in the absence of other obvious underlying medical  diseases.  He has some mild frequency, nocturia and slightly weak  stream, but no significant voiding symptoms necessitating therapy and no  past history of prostatitis or urologic surgery.  He has had no dysuria  or hematuria.   He was admitted for evaluation of right arm weakness and an abnormal CT  of the brain.   PAST MEDICAL HISTORY:  He has no significant medical illnesses.   MEDICATIONS:  No medications on admission.   ALLERGIES:  HE IS ALLERGIC TO PENICILLIN.   SOCIAL HISTORY:  He denies smoking or drinking alcohol.   FAMILY HISTORY:  Unremarkable.   He denies any headache, chest pain, shortness of breath, diarrhea.  He  denies any incontinence.  Apparently has had a 10 pound weight loss.   PHYSICAL EXAMINATION:  VITAL SIGNS:  Blood pressure is 137/66, pulse 69,  temperature 99.1.  GENERAL:  He is alert and oriented with some weakness of his right arm.  NECK:  Supple.  LUNGS:  No respiratory distress.  ABDOMEN:  No abdominal or CVA tenderness.  No hernias or inguinal nodes.  GENITALIA:  Penis, urethral meatus, scrotum, testes and epididymis is  unremarkable.  Perineum is normal.  Anal sphincter tone is normal.  Prostate feels benign and smooth.  No seminal vesicle tissue palpated.   IMPRESSION:  1. Elevated (PSA) prostate-specific antigen.  2. [place].   PLAN:  Bone scan is already pending per Dr. Erling Cruz and I will reorder a  PSA today to confirm its abnormal level and he will probably need a  prostate ultrasound biopsy done at some point, but he will need to get  over the acute phase of his stroke.      Lucina Mellow. Terance Hart, M.D.  Electronically Signed     LJP/MEDQ  D:  01/01/2007  T:  01/02/2007  Job:  [id number]   cc:   Donzetta Matters, MD  Alyson Locket. Love, M.D.

## 2010-12-25 ENCOUNTER — Other Ambulatory Visit: Payer: Self-pay | Admitting: Internal Medicine

## 2010-12-25 DIAGNOSIS — R0989 Other specified symptoms and signs involving the circulatory and respiratory systems: Secondary | ICD-10-CM

## 2010-12-31 ENCOUNTER — Ambulatory Visit
Admission: RE | Admit: 2010-12-31 | Discharge: 2010-12-31 | Disposition: A | Discharge status: Home / Self Care | Payer: Medicare Other | Source: Ambulatory Visit | Attending: Internal Medicine | Admitting: Internal Medicine

## 2010-12-31 DIAGNOSIS — R0989 Other specified symptoms and signs involving the circulatory and respiratory systems: Secondary | ICD-10-CM

## 2011-03-02 ENCOUNTER — Encounter: Payer: Medicare Other | Admitting: Surgery

## 2011-03-06 ENCOUNTER — Encounter: Payer: Self-pay | Admitting: Vascular Surgery

## 2011-03-09 ENCOUNTER — Encounter: Payer: Self-pay | Admitting: Vascular Surgery

## 2011-03-09 ENCOUNTER — Ambulatory Visit (INDEPENDENT_AMBULATORY_CARE_PROVIDER_SITE_OTHER): Payer: Medicare Other | Admitting: Vascular Surgery

## 2011-03-09 VITALS — BP 133/39 | HR 62 | Resp 18 | Ht 69.0 in | Wt 181.0 lb

## 2011-03-09 DIAGNOSIS — I739 Peripheral vascular disease, unspecified: Secondary | ICD-10-CM

## 2011-03-09 DIAGNOSIS — I70219 Atherosclerosis of native arteries of extremities with intermittent claudication, unspecified extremity: Secondary | ICD-10-CM

## 2011-03-09 NOTE — Progress Notes (Signed)
Subjective:     Patient ID: [name], male   DOB: [birth year], 70 y.o.   MRN: [id number]  HPI this patient was referred to evaluate possible lower extremity arterial insufficiency. He has no history of rest pain nonhealing ulcers infection or gangrene. He does have bilateral calf claudication symptoms which limited his walking to about one half block. He is able to resume after 3-4 minutes and continue walking a similar distance. Occasionally he'll have some discomfort in his hips. He does have a history of back problems degenerative joint disease.  Past Medical History  Diagnosis Date  . Hypertension   . Hypertensive kidney disease, benign   . Cataract     traumatic  . Gingivitis   . 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 Microscopic hematuria   . 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 Elevated prostate specific antigen (PSA)   . Unspecified vitamin D deficiency   . Depressive disorder, not elsewhere classified   . Nondependent cannabis abuse   . Hyperlipidemia   . Anemia   . Pure hypercholesterolemia   . Unspecified essential hypertension   . Unspecified late effects of cerebrovascular disease     History  Substance Use Topics  . Smoking status: Former Smoker    Quit date: 03/09/1971  . Smokeless tobacco: Not on file  . Alcohol Use: No    Family History  Problem Relation Age of Onset  . Cancer Father     Allergies  Allergen Reactions  . Penicillins     Current outpatient prescriptions:amLODipine (NORVASC) 5 MG tablet, Take 5 mg by mouth daily.  , Disp: , Rfl: ;  aspirin 81 MG tablet, Take 81 mg by mouth daily.  , Disp: , Rfl: ;  brimonidine (ALPHAGAN) 0.2 % ophthalmic solution, Place 1 drop into the left eye daily.  , Disp: , Rfl: ;  Calcium Carbonate-Vitamin D (CALCIUM + D PO), Take 500 mg by mouth.  , Disp: , Rfl: ;  clopidogrel (PLAVIX) 75 MG tablet, Take 75  mg by mouth daily.  , Disp: , Rfl:  doxazosin (CARDURA) 2 MG tablet, Take 2 mg by mouth at bedtime. Take two @@ bedtime , Disp: , Rfl: ;  furosemide (LASIX) 20 MG tablet, Take 20 mg by mouth 2 (two) times daily.  , Disp: , Rfl: ;  labetalol (NORMODYNE) 300 MG tablet, Take 300 mg by mouth 2 (two) times daily.  , Disp: , Rfl: ;  losartan (COZAAR) 100 MG tablet, Take 100 mg by mouth daily.  , Disp: , Rfl:  pravastatin (PRAVACHOL) 40 MG tablet, Take 40 mg by mouth daily.  , Disp: , Rfl: ;  simvastatin (ZOCOR) 40 MG tablet, Take 40 mg by mouth at bedtime.  , Disp: , Rfl:   Filed Vitals:   03/09/11 1057  Height: 5\' 9"  (1.753 m)  Weight: 181 lb (82.101 kg)    Body mass index is 26.73 kg/(m^2).         Review of Systems  Constitutional: Negative for fever, chills and appetite change.  HENT: Negative for hearing loss, nosebleeds, congestion, sore throat, trouble swallowing and voice change.   Eyes: Negative for photophobia and visual disturbance.  Respiratory: Negative for cough, chest tightness, shortness of breath and wheezing.   Cardiovascular: Negative for chest pain, palpitations and leg swelling.  Gastrointestinal: Negative for nausea, vomiting, abdominal pain, diarrhea, constipation, blood in stool and  abdominal distention.  Genitourinary: Positive for frequency. Negative for urgency, hematuria and difficulty urinating.  Musculoskeletal: Positive for joint swelling and arthralgias. Negative for back pain and gait problem.  Skin: Negative for pallor, rash and wound.  Neurological: Positive for weakness. Negative for dizziness, syncope, facial asymmetry, speech difficulty, light-headedness, numbness and headaches.  Hematological: Negative for adenopathy.  Psychiatric/Behavioral: Negative for confusion.       Objective:   Physical Exam  Constitutional: He is oriented to person, place, and time. He appears well-developed and well-nourished. No distress.  HENT:  Head: Normocephalic.    Mouth/Throat: Oropharynx is clear and moist.  Eyes: EOM are normal.  Neck: Normal range of motion. Neck supple.  Cardiovascular: Normal rate and regular rhythm.   No murmur heard.      Lower extremity exam reveals 3+ femoral pulses bilaterally. There is a 3+ right popliteal and 1-2+ left popliteal pulse palpable. No pedal pulses are palpable in either lower extremity. No evidence of cellulitis, gangrene, infection, or nonhealing ulcers.  Pulmonary/Chest: Effort normal and breath sounds normal. No respiratory distress. He has no wheezes. He has no rales.  Abdominal: Soft. He exhibits no distension and no mass. There is no tenderness. There is no rebound and no guarding.  Musculoskeletal: Normal range of motion. He exhibits no edema.  Lymphadenopathy:    He has no cervical adenopathy.  Neurological: He is alert and oriented to person, place, and time. Coordination normal.  Skin: Skin is warm and dry. No rash noted.  Psychiatric: His behavior is normal.       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 No evidence of limb threatening ischemia.    Plan:     No need for further vascular evaluation at this time. The patient develops potential limb threatening ischemia such as gangrene cellulitis infection or nonhealing ulcer we'll reconsider evaluation at that time.

## 2011-05-05 LAB — CBC
HCT: 33.6 — ABNORMAL LOW
Hemoglobin: 10.9 — ABNORMAL LOW
MCV: 79.2
Platelets: 396
RDW: 16.7 — ABNORMAL HIGH

## 2011-05-05 LAB — BASIC METABOLIC PANEL
BUN: 18
Calcium: 9.6
GFR calc non Af Amer: >60
Potassium: 3.9

## 2011-05-11 LAB — BASIC METABOLIC PANEL
BUN: 15
CO2: 27
Calcium: 9.7
GFR calc non Af Amer: >60
Glucose, Bld: 102 — ABNORMAL HIGH
Potassium: 3.6
Sodium: 136

## 2011-05-11 LAB — CBC
HCT: 29.6 — ABNORMAL LOW
Hemoglobin: 9.4 — ABNORMAL LOW
MCHC: 31.6
Platelets: 485 — ABNORMAL HIGH
RDW: 18.4 — ABNORMAL HIGH

## 2011-05-11 LAB — APTT: aPTT: 87 — ABNORMAL HIGH

## 2011-05-18 LAB — COMPREHENSIVE METABOLIC PANEL
Albumin: 2.7 — ABNORMAL LOW
Alkaline Phosphatase: 58
BUN: 17
Calcium: 9.4
Creatinine, Ser: 0.84
Glucose, Bld: 158 — ABNORMAL HIGH
Total Protein: 7.5

## 2011-05-18 LAB — CROSSMATCH: ABO/RH(D): O POS

## 2011-05-18 LAB — CBC
HCT: 29 — ABNORMAL LOW
HCT: 30.8 — ABNORMAL LOW
Hemoglobin: 9.6 — ABNORMAL LOW
Hemoglobin: 9.9 — ABNORMAL LOW
MCHC: 32
MCHC: 32.9
Platelets: 530 — ABNORMAL HIGH
RDW: 18.1 — ABNORMAL HIGH
RDW: 19.2 — ABNORMAL HIGH

## 2011-05-18 LAB — DIFFERENTIAL
Basophils Relative: 1
Lymphocytes Relative: 17
Lymphs Abs: 1.2
Monocytes Absolute: 0.7
Monocytes Relative: 10
Neutro Abs: 4.9
Neutrophils Relative %: 69

## 2011-05-18 LAB — PROTIME-INR: INR: 1.2

## 2011-05-18 LAB — APTT: aPTT: 73 — ABNORMAL HIGH

## 2011-05-18 LAB — POCT I-STAT 4, (NA,K, GLUC, HGB,HCT)
Glucose, Bld: 118 — ABNORMAL HIGH
Hemoglobin: 7.8 — CL
Operator id: 147011
Potassium: 4
Potassium: 4.2
Sodium: 137

## 2011-05-18 LAB — BASIC METABOLIC PANEL
CO2: 21
Glucose, Bld: 123 — ABNORMAL HIGH
Potassium: 4.2
Sodium: 134 — ABNORMAL LOW

## 2011-05-20 LAB — COMPREHENSIVE METABOLIC PANEL
Alkaline Phosphatase: 64
BUN: 21
Calcium: 9.8
Glucose, Bld: 105 — ABNORMAL HIGH
Total Protein: 8.6 — ABNORMAL HIGH

## 2011-05-20 LAB — BASIC METABOLIC PANEL
CO2: 22
CO2: 24
Calcium: 8.6
Chloride: 102
Chloride: 104
Creatinine, Ser: 0.77
GFR calc Af Amer: >60
GFR calc Af Amer: >60
Glucose, Bld: 97
Potassium: 4
Sodium: 131 — ABNORMAL LOW
Sodium: 133 — ABNORMAL LOW

## 2011-05-20 LAB — CBC
HCT: 29.6 — ABNORMAL LOW
HCT: 32.3 — ABNORMAL LOW
Hemoglobin: 7.6 — CL
Hemoglobin: 7.6 — CL
Hemoglobin: 9.4 — ABNORMAL LOW
MCHC: 31.5
MCHC: 31.5
MCHC: 31.8
MCV: 76.7 — ABNORMAL LOW
MCV: 77.3 — ABNORMAL LOW
MCV: 77.6 — ABNORMAL LOW
Platelets: 468 — ABNORMAL HIGH
RBC: 3.12 — ABNORMAL LOW
RBC: 3.14 — ABNORMAL LOW
RDW: 18.4 — ABNORMAL HIGH
RDW: 18.7 — ABNORMAL HIGH
WBC: 6.3
WBC: 6.5
WBC: 8.1

## 2011-05-20 LAB — TYPE AND SCREEN

## 2011-05-20 LAB — APTT
aPTT: 62 — ABNORMAL HIGH
aPTT: 75 — ABNORMAL HIGH
aPTT: 87 — ABNORMAL HIGH

## 2011-05-20 LAB — PREPARE RBC (CROSSMATCH)

## 2011-05-20 LAB — DIFFERENTIAL
Basophils Relative: 0
Monocytes Relative: 12 — ABNORMAL HIGH
Neutro Abs: 5.4
Neutrophils Relative %: 67

## 2011-05-20 LAB — PROTIME-INR
INR: 1.1
Prothrombin Time: 15.4 — ABNORMAL HIGH

## 2011-05-20 LAB — HEPARIN LEVEL (UNFRACTIONATED): Heparin Unfractionated: <0.1 — ABNORMAL LOW

## 2011-05-21 LAB — URINALYSIS, ROUTINE W REFLEX MICROSCOPIC
Bilirubin Urine: NEGATIVE
Glucose, UA: NEGATIVE
Hgb urine dipstick: NEGATIVE
Ketones, ur: NEGATIVE
Nitrite: NEGATIVE
Protein, ur: NEGATIVE
Specific Gravity, Urine: 1.013
Urobilinogen, UA: 1
pH: 6.5

## 2011-05-21 LAB — CBC
HCT: 26.5 — ABNORMAL LOW
HCT: 27.2 — ABNORMAL LOW
Hemoglobin: 8.3 — ABNORMAL LOW
Hemoglobin: 8.4 — ABNORMAL LOW
Hemoglobin: 8.7 — ABNORMAL LOW
MCHC: 32
MCHC: 32.7
MCV: 78.3
MCV: 79.5
MCV: 80.2
RBC: 3.23 — ABNORMAL LOW
RBC: 3.3 — ABNORMAL LOW
RBC: 3.42 — ABNORMAL LOW
RDW: 17.5 — ABNORMAL HIGH
WBC: 7.2
WBC: 7.2

## 2011-05-21 LAB — BASIC METABOLIC PANEL
Chloride: 104
GFR calc non Af Amer: >60
Potassium: 4.3
Sodium: 135

## 2011-05-21 LAB — PROTEIN ELECTROPH W RFLX QUANT IMMUNOGLOBULINS
Alpha-1-Globulin: 8.7 — ABNORMAL HIGH
Alpha-2-Globulin: 24.9 — ABNORMAL HIGH
Beta Globulin: 4.2 — ABNORMAL LOW
Gamma Globulin: 20.8 — ABNORMAL HIGH

## 2011-05-21 LAB — APTT: aPTT: 63 — ABNORMAL HIGH

## 2011-05-21 LAB — PROTIME-INR
INR: 1.1
Prothrombin Time: 14.8

## 2011-07-24 ENCOUNTER — Encounter (INDEPENDENT_AMBULATORY_CARE_PROVIDER_SITE_OTHER): Payer: Medicare Other | Admitting: Ophthalmology

## 2011-07-24 DIAGNOSIS — H43819 Vitreous degeneration, unspecified eye: Secondary | ICD-10-CM

## 2011-07-24 DIAGNOSIS — H3530 Unspecified macular degeneration: Secondary | ICD-10-CM

## 2011-07-24 DIAGNOSIS — H35349 Macular cyst, hole, or pseudohole, unspecified eye: Secondary | ICD-10-CM

## 2011-07-24 DIAGNOSIS — H2510 Age-related nuclear cataract, unspecified eye: Secondary | ICD-10-CM

## 2011-07-24 DIAGNOSIS — H35039 Hypertensive retinopathy, unspecified eye: Secondary | ICD-10-CM

## 2011-07-24 DIAGNOSIS — I10 Essential (primary) hypertension: Secondary | ICD-10-CM

## 2012-02-10 ENCOUNTER — Telehealth (HOSPITAL_COMMUNITY): Payer: Self-pay

## 2012-02-10 NOTE — Telephone Encounter (Signed)
I left a message for Mr. Zant in regards to his F/U angio.

## 2012-07-25 ENCOUNTER — Ambulatory Visit (INDEPENDENT_AMBULATORY_CARE_PROVIDER_SITE_OTHER): Payer: Medicare Other | Admitting: Ophthalmology

## 2012-07-25 DIAGNOSIS — H43819 Vitreous degeneration, unspecified eye: Secondary | ICD-10-CM

## 2012-07-25 DIAGNOSIS — H35349 Macular cyst, hole, or pseudohole, unspecified eye: Secondary | ICD-10-CM

## 2012-07-25 DIAGNOSIS — I10 Essential (primary) hypertension: Secondary | ICD-10-CM

## 2012-07-25 DIAGNOSIS — H35039 Hypertensive retinopathy, unspecified eye: Secondary | ICD-10-CM

## 2013-01-17 ENCOUNTER — Ambulatory Visit: Payer: Medicare HMO

## 2013-01-17 ENCOUNTER — Other Ambulatory Visit: Payer: Self-pay | Admitting: *Deleted

## 2013-01-17 DIAGNOSIS — E785 Hyperlipidemia, unspecified: Secondary | ICD-10-CM

## 2013-01-17 DIAGNOSIS — I10 Essential (primary) hypertension: Secondary | ICD-10-CM

## 2013-01-17 DIAGNOSIS — D649 Anemia, unspecified: Secondary | ICD-10-CM

## 2013-01-18 ENCOUNTER — Other Ambulatory Visit: Payer: Self-pay | Admitting: Internal Medicine

## 2013-01-18 LAB — CBC WITH DIFFERENTIAL/PLATELET
Basophils Absolute: 0 10*3/uL (ref 0.0–0.2)
Basos: 0 % (ref 0–3)
Eos: 17 % — ABNORMAL HIGH (ref 0–5)
Eosinophils Absolute: 0.8 10*3/uL — ABNORMAL HIGH (ref 0.0–0.4)
HCT: 36.6 % — ABNORMAL LOW (ref 37.5–51.0)
Hemoglobin: 11.9 g/dL — ABNORMAL LOW (ref 12.6–17.7)
Immature Grans (Abs): 0 10*3/uL (ref 0.0–0.1)
Immature Granulocytes: 0 % (ref 0–2)
Lymphocytes Absolute: 1.3 10*3/uL (ref 0.7–3.1)
Lymphs: 28 % (ref 14–46)
MCH: 28.4 pg (ref 26.6–33.0)
MCHC: 32.5 g/dL (ref 31.5–35.7)
MCV: 87 fL (ref 79–97)
Monocytes Absolute: 0.4 10*3/uL (ref 0.1–0.9)
Monocytes: 8 % (ref 4–12)
Neutrophils Absolute: 2.2 10*3/uL (ref 1.4–7.0)
Neutrophils Relative %: 47 % (ref 40–74)
RBC: 4.19 x10E6/uL (ref 4.14–5.80)
RDW: 14.4 % (ref 12.3–15.4)
WBC: 4.7 10*3/uL (ref 3.4–10.8)

## 2013-01-18 LAB — BASIC METABOLIC PANEL
BUN/Creatinine Ratio: 15 (ref 10–22)
BUN: 21 mg/dL (ref 8–27)
CO2: 22 mmol/L (ref 18–29)
Calcium: 9.4 mg/dL (ref 8.6–10.2)
Chloride: 105 mmol/L (ref 97–108)
Creatinine, Ser: 1.44 mg/dL — ABNORMAL HIGH (ref 0.76–1.27)
GFR calc Af Amer: 56 mL/min/{1.73_m2} — ABNORMAL LOW (ref >59)
GFR calc non Af Amer: 49 mL/min/{1.73_m2} — ABNORMAL LOW (ref >59)
Glucose: 101 mg/dL — ABNORMAL HIGH (ref 65–99)
Potassium: 4.2 mmol/L (ref 3.5–5.2)
Sodium: 140 mmol/L (ref 134–144)

## 2013-01-18 LAB — IRON: Iron: 42 ug/dL (ref 40–155)

## 2013-01-19 ENCOUNTER — Encounter: Payer: Self-pay | Admitting: *Deleted

## 2013-01-20 ENCOUNTER — Encounter: Payer: Self-pay | Admitting: Internal Medicine

## 2013-01-20 ENCOUNTER — Ambulatory Visit (INDEPENDENT_AMBULATORY_CARE_PROVIDER_SITE_OTHER): Payer: Medicare HMO | Admitting: Internal Medicine

## 2013-01-20 VITALS — BP 122/86 | HR 51 | Temp 97.7°F | Resp 13 | Ht 69.0 in | Wt 185.0 lb

## 2013-01-20 DIAGNOSIS — D649 Anemia, unspecified: Secondary | ICD-10-CM | POA: Insufficient documentation

## 2013-01-20 DIAGNOSIS — I10 Essential (primary) hypertension: Secondary | ICD-10-CM | POA: Insufficient documentation

## 2013-01-20 DIAGNOSIS — E785 Hyperlipidemia, unspecified: Secondary | ICD-10-CM

## 2013-01-20 DIAGNOSIS — N183 Chronic kidney disease, stage 3 unspecified: Secondary | ICD-10-CM

## 2013-01-20 NOTE — Progress Notes (Signed)
Patient ID: [name], male   DOB: 1941/07/10, 72 y.o.   MRN: [id number] Location:  [facility] / [name]  Allergies  Allergen Reactions  . Penicillins     Chief Complaint  Patient presents with  . Medical Managment of Chronic Issues    HPI: Patient is a 72 y.o. AA male seen in the office today for med mgt chronic diseases BP is good.  No concerns he has.  Cardiologist increased pravachol.  Has not had hemoccult stool lately.  Iron level was normal.  Has normocytic anemia with CKD.  Kidneys stable from last lab in old [facility].  Has not noted blood in stool or dark stool.  Feels fine.    Requests handicapped plaquard as cannot walk more than 260ft w/o stopping to rest.  Review of Systems:  Review of Systems  Constitutional: Negative for fever and chills.  HENT: Negative for congestion.   Eyes: Negative for blurred vision.  Respiratory: Negative for shortness of breath.   Cardiovascular: Negative for chest pain and leg swelling.  Gastrointestinal: Negative for constipation, blood in stool and melena.  Genitourinary: Negative for dysuria.  Musculoskeletal: Negative for falls and myalgias.  Skin: Negative for rash.  Neurological: Negative for dizziness.  Endo/Heme/Allergies: Does not bruise/bleed easily.  Psychiatric/Behavioral: Negative for depression and memory loss.     Past Medical History  Diagnosis Date  . Hypertension   . Hypertensive kidney disease, benign   . Cataract     traumatic  . Gingivitis   . 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 Microscopic hematuria   . 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 Elevated prostate specific antigen (PSA)   . Unspecified vitamin D deficiency   . Depressive disorder, not elsewhere classified   . Nondependent cannabis abuse   . Hyperlipidemia   . Anemia   . Pure hypercholesterolemia   .  Unspecified essential hypertension   . Unspecified late effects of cerebrovascular disease   . Chronic diastolic heart failure   . Chronic kidney disease, stage III (moderate)   . Peripheral vascular disease, unspecified   . 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 Elevated prostate specific antigen (PSA)   . Unspecified vitamin D deficiency   . Depressive disorder, not elsewhere classified   . Unspecified late effects of cerebrovascular disease     Past Surgical History  Procedure Laterality Date  . Coronary stent placement      2008  . Cataract extraction    . Eye surgery      left eye,    cornea repair    Social History:   reports that he quit smoking about 41 years ago. He does not have any smokeless tobacco history on file. He reports that he does not drink alcohol or use illicit drugs.  Family History  Problem Relation Age of Onset  . Cancer Father     Medications: Patient's Medications  New Prescriptions   No medications on file  Previous Medications   AMLODIPINE (NORVASC) 10 MG TABLET    Take one tablet once daily for blood pressure   ASPIRIN 81 MG TABLET    Take 81 mg by mouth daily.     CALCIUM-VITAMIN D (CALCIUM 500+D HIGH POTENCY) 500-400 MG-UNIT PER TABLET    Take one tablet by mouth three times daily  CLOPIDOGREL (PLAVIX) 75 MG TABLET    TAKE 1 TABLET BY MOUTH EVERY DAY TO HELP WITH CIRCULATION   DOXAZOSIN (CARDURA) 4 MG TABLET    Take one tablet by mouth for blood pressure   FUROSEMIDE (LASIX) 20 MG TABLET    Take 20 mg by mouth 2 (two) times daily.     LABETALOL (NORMODYNE) 300 MG TABLET    Take 300 mg by mouth 2 (two) times daily.     LOSARTAN (COZAAR) 50 MG TABLET    Take one tablet once daily for blood pressure   PRAVASTATIN (PRAVACHOL) 80 MG TABLET    Take one tablet once daily to control cholesterol  Modified Medications   No medications on file  Discontinued Medications   BRIMONIDINE  (ALPHAGAN) 0.2 % OPHTHALMIC SOLUTION    Place 1 drop into the left eye daily.       Physical Exam: Filed Vitals:   01/20/13 0946  BP: 122/86  Pulse: 51  Temp: 97.7 F (36.5 C)  TempSrc: Oral  Resp: 13  Height: 5\' 9"  (1.753 m)  Weight: 185 lb (83.915 kg)  Physical Exam  Constitutional: He is oriented to person, place, and time. He appears well-developed and well-nourished. No distress.  Cardiovascular: Normal rate, regular rhythm, normal heart sounds and intact distal pulses.   Pulmonary/Chest: Effort normal and breath sounds normal. No respiratory distress.  Abdominal: Soft. Bowel sounds are normal. He exhibits no distension. There is no tenderness.  Musculoskeletal: Normal range of motion. He exhibits no edema and no tenderness.  Neurological: He is alert and oriented to person, place, and time.  Skin: Skin is warm and dry.     Labs reviewed: Basic Metabolic Panel:  Recent Labs  01/17/13 0921  NA 140  K 4.2  CL 105  CO2 22  GLUCOSE 101*  BUN 21  CREATININE 1.44*  CALCIUM 9.4  CBC:  Recent Labs  01/17/13 0921  WBC 4.7  NEUTROABS 2.2  HGB 11.9*  HCT 36.6*  MCV 87   Assessment/Plan 1. Anemia - POC Hemoccult Bld/Stl (3-Cd Home Screen); Future - CBC with Differential - B12 and Folate Panel  2. Hypertension -at goal with current therapy  3. Hyperlipidemia -check lipids in 99991111 - Basic metabolic panel  4. Chronic kidney disease, stage III (moderate) -encouraged hydration with water -f/u renal function - Basic metabolic panel    Labs/tests ordered: Next appt:

## 2013-01-27 ENCOUNTER — Other Ambulatory Visit: Payer: Self-pay | Admitting: *Deleted

## 2013-01-27 MED ORDER — DOXAZOSIN MESYLATE 4 MG PO TABS: ORAL_TABLET | ORAL | Status: DC

## 2013-04-05 ENCOUNTER — Other Ambulatory Visit: Payer: Self-pay | Admitting: Internal Medicine

## 2013-05-22 ENCOUNTER — Ambulatory Visit: Payer: Medicare HMO | Admitting: Internal Medicine

## 2013-06-05 ENCOUNTER — Ambulatory Visit (INDEPENDENT_AMBULATORY_CARE_PROVIDER_SITE_OTHER): Payer: Medicare HMO | Admitting: Internal Medicine

## 2013-06-05 ENCOUNTER — Other Ambulatory Visit: Payer: Self-pay | Admitting: *Deleted

## 2013-06-05 ENCOUNTER — Encounter: Payer: Self-pay | Admitting: Internal Medicine

## 2013-06-05 VITALS — BP 120/64 | HR 60 | Temp 98.4°F | Resp 16 | Wt 178.6 lb

## 2013-06-05 DIAGNOSIS — I110 Hypertensive heart disease with heart failure: Secondary | ICD-10-CM | POA: Insufficient documentation

## 2013-06-05 DIAGNOSIS — I5032 Chronic diastolic (congestive) heart failure: Secondary | ICD-10-CM

## 2013-06-05 DIAGNOSIS — N183 Chronic kidney disease, stage 3 unspecified: Secondary | ICD-10-CM

## 2013-06-05 DIAGNOSIS — Z23 Encounter for immunization: Secondary | ICD-10-CM

## 2013-06-05 DIAGNOSIS — E785 Hyperlipidemia, unspecified: Secondary | ICD-10-CM | POA: Insufficient documentation

## 2013-06-05 DIAGNOSIS — I129 Hypertensive chronic kidney disease with stage 1 through stage 4 chronic kidney disease, or unspecified chronic kidney disease: Secondary | ICD-10-CM

## 2013-06-05 DIAGNOSIS — I6509 Occlusion and stenosis of unspecified vertebral artery: Secondary | ICD-10-CM

## 2013-06-05 DIAGNOSIS — I10 Essential (primary) hypertension: Secondary | ICD-10-CM

## 2013-06-05 DIAGNOSIS — I63239 Cerebral infarction due to unspecified occlusion or stenosis of unspecified carotid arteries: Secondary | ICD-10-CM

## 2013-06-05 NOTE — Progress Notes (Signed)
Patient ID: [name], male   DOB: May 16, 1941, 72 y.o.   MRN: [id number] Location:  [facility] / [name]   Allergies  Allergen Reactions  . Penicillins     Chief Complaint  Patient presents with  . Medical Managment of Chronic Issues    4 month f/u    HPI: Patient is a 72 y.o. AA male seen in the office today for medical management of chronic issues. Pt. Overall has no new complaints. States that he is continuing to monitor his diet. Pt. Is trying to exercise some by walking but states that he can't walk far after having a stroke 6 years ago. Also has a weight at home that he lifts.  Pt. Used to check BP at home but now that it is in normal range he no longer checks it. States when it was running high he would check it at home frequently. Pt. Reports that Dr. Einar Gip wants him off the plavix and was suppose to contact [facility] MD. Pt. Reports that he was placed on plavix after his stroke because stents were placed. Pt. Is unsure of the location of the stents.   Review of Systems:  Review of Systems  Constitutional: Positive for weight loss. Negative for fever and chills.       D/t exercising and diet  Eyes: Negative for blurred vision and double vision.  Respiratory: Positive for shortness of breath.        Only when he bends over to tie shoes d/t to pressure placed on chest d/t stomach  Cardiovascular: Negative for chest pain.  Gastrointestinal: Negative for nausea, vomiting, diarrhea and constipation.  Genitourinary: Negative for dysuria, urgency and frequency.  Musculoskeletal: Positive for back pain.       With activity such as walking  Neurological: Negative for dizziness.  Psychiatric/Behavioral: Positive for memory loss. Negative for depression. The patient is not nervous/anxious.        States that after stroke there are some things he can't remember   Past Medical History  Diagnosis Date  . Hypertension   . Hypertensive kidney disease,  benign   . Cataract     traumatic  . Gingivitis   . 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 Microscopic hematuria   . 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 Elevated prostate specific antigen (PSA)   . Unspecified vitamin D deficiency   . Depressive disorder, not elsewhere classified   . Nondependent cannabis abuse   . Hyperlipidemia   . Anemia   . Pure hypercholesterolemia   . Unspecified essential hypertension   . Unspecified late effects of cerebrovascular disease   . Chronic diastolic heart failure   . Chronic kidney disease, stage III (moderate)   . Peripheral vascular disease, unspecified   . 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 Elevated prostate specific antigen (PSA)   . Unspecified vitamin D deficiency   . Depressive disorder, not elsewhere classified   . Unspecified late effects of cerebrovascular disease     Past Surgical History  Procedure Laterality Date  . Coronary stent placement      2008  . Cataract extraction    . Eye surgery      left eye,    cornea repair    Social History:   reports that he quit smoking about 42 years  ago. He does not have any smokeless tobacco history on file. He reports that he does not drink alcohol or use illicit drugs.  Family History  Problem Relation Age of Onset  . Cancer Father     Medications: Patient's Medications  New Prescriptions   No medications on file  Previous Medications   AMLODIPINE (NORVASC) 10 MG TABLET    Take one tablet once daily for blood pressure   ASPIRIN 81 MG TABLET    Take 81 mg by mouth daily.     CALCIUM-VITAMIN D (CALCIUM 500+D HIGH POTENCY) 500-400 MG-UNIT PER TABLET    Take one tablet by mouth three times daily   CLOPIDOGREL (PLAVIX) 75 MG TABLET    TAKE 1 TABLET BY MOUTH EVERY DAY TO HELP WITH CIRCULATION   DOXAZOSIN (CARDURA) 4  MG TABLET    Take one tablet by mouth for blood pressure   FUROSEMIDE (LASIX) 20 MG TABLET    TAKE 1 TABLET BY MOUTH EVERY MORNING AND TAKE 1 TABLET EVERY EVENING   LABETALOL (NORMODYNE) 300 MG TABLET    Take 300 mg by mouth 2 (two) times daily.     LOSARTAN (COZAAR) 50 MG TABLET    Take one tablet once daily for blood pressure   PRAVASTATIN (PRAVACHOL) 80 MG TABLET    Take one tablet once daily to control cholesterol  Modified Medications   No medications on file  Discontinued Medications   No medications on file     Physical Exam: Filed Vitals:   06/05/13 1329  BP: 120/64  Pulse: 60  Temp: 98.4 F (36.9 C)  TempSrc: Oral  Resp: 16  Weight: 178 lb 9.6 oz (81.012 kg)  SpO2: 97%   Physical Exam  Constitutional: He is oriented to person, place, and time. He appears well-developed and well-nourished.  Neck:  No carotid bruits  Cardiovascular: Normal rate, regular rhythm, normal heart sounds and intact distal pulses.   Pulmonary/Chest: Effort normal and breath sounds normal.  Musculoskeletal: Normal range of motion. He exhibits no edema and no tenderness.  Neurological: He is alert and oriented to person, place, and time. He has normal strength and normal reflexes. No sensory deficit. He displays a negative Romberg sign.  Skin: Skin is warm and dry.  Psychiatric:  Affect is somewhat flat, laissez-faire type attitude   Labs reviewed: Basic Metabolic Panel:  Recent Labs  01/17/13 0921  NA 140  K 4.2  CL 105  CO2 22  GLUCOSE 101*  BUN 21  CREATININE 1.44*  CALCIUM 9.4   CBC:  Recent Labs  01/17/13 0921  WBC 4.7  NEUTROABS 2.2  HGB 11.9*  HCT 36.6*  MCV 87   Past Procedures: Reviewed prior records in epic: 09/19/09:  BILATERAL CAROTID ARTERIOGRAPHY AND LEFT VERTEBRAL ARTERY  ANGIOGRAM: 1. Angiographically wide patency of the previously stented portions of the internal carotid arteries in the cavernous and proximal supraclinoid segments bilaterally.  2. An  approximately 65-70% stable stenosis at the origin of the left vertebral artery.  3. The findings were reviewed with the patient. The patient is on aspirin 325 mg Plavix 75 mg and his antihypertensives. Clinically, the patient is asymptomatic. The patient has been asked to continue with his present medical regimen. A follow-up arteriogram will be undertaken a year from Today. 11/11/10:  Renal ultrasound:  1. Sonographically normal kidneys. 2. Enlarged prostate. 12/25/10:  Arterial ultrasound:  Mildly depressed bilateral ankle brachial indices at rest in the 0.8 range. Segmental evaluation shows predominant disease pattern  below the knees at the level of the tibial arteries bilaterally. However, wave form analysis also demonstrates probable component of SFA disease. Consider further evaluation with CT angiography or MR angiography to further characterize exact nature of arterial occlusive disease.  Assessment/Plan 1. Carotid stenosis, symptomatic, with infarction Prior stroke due to this Has no current symptoms Had stenting and requires plavix ongoing due to this  2. 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 Chronic diastolic heart failure -stable, asymptomatic, continue to follow with Dr. Adrian Prows -cont asa, plavix, lasix, arb, statin, beta blocker  4. Hyperlipidemia LDL goal < 100 - Lipid panel; Future  5. 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 Chronic kidney disease, stage III (moderate) -f/u bmp and cbc  7. Need for prophylactic vaccination and inoculation against influenza Given flu shot today  Labs/tests ordered:  Flp, cbc, bmp, b12/folate, CTA head and neck Next appt:  4 mos

## 2013-06-06 ENCOUNTER — Other Ambulatory Visit: Payer: Medicare HMO

## 2013-06-06 DIAGNOSIS — E785 Hyperlipidemia, unspecified: Secondary | ICD-10-CM

## 2013-06-06 DIAGNOSIS — D649 Anemia, unspecified: Secondary | ICD-10-CM

## 2013-06-06 DIAGNOSIS — I10 Essential (primary) hypertension: Secondary | ICD-10-CM

## 2013-06-07 LAB — LIPID PANEL
Chol/HDL Ratio: 3 ratio units (ref 0.0–5.0)
Cholesterol, Total: 137 mg/dL (ref 100–199)
HDL: 45 mg/dL (ref >39)
LDL Calculated: 76 mg/dL (ref 0–99)
Triglycerides: 82 mg/dL (ref 0–149)
VLDL Cholesterol Cal: 16 mg/dL (ref 5–40)

## 2013-06-07 LAB — COMPREHENSIVE METABOLIC PANEL
ALT: 11 IU/L (ref 0–44)
AST: 19 IU/L (ref 0–40)
Albumin/Globulin Ratio: 1.9 (ref 1.1–2.5)
Albumin: 4.4 g/dL (ref 3.5–4.8)
Alkaline Phosphatase: 68 IU/L (ref 39–117)
BUN/Creatinine Ratio: 17 (ref 10–22)
BUN: 23 mg/dL (ref 8–27)
CO2: 22 mmol/L (ref 18–29)
Calcium: 9.5 mg/dL (ref 8.6–10.2)
Chloride: 105 mmol/L (ref 97–108)
Creatinine, Ser: 1.38 mg/dL — ABNORMAL HIGH (ref 0.76–1.27)
GFR calc Af Amer: 59 mL/min/{1.73_m2} — ABNORMAL LOW (ref >59)
GFR calc non Af Amer: 51 mL/min/{1.73_m2} — ABNORMAL LOW (ref >59)
Globulin, Total: 2.3 g/dL (ref 1.5–4.5)
Glucose: 103 mg/dL — ABNORMAL HIGH (ref 65–99)
Potassium: 4.5 mmol/L (ref 3.5–5.2)
Sodium: 143 mmol/L (ref 134–144)
Total Bilirubin: <0.2 mg/dL (ref 0.0–1.2)
Total Protein: 6.7 g/dL (ref 6.0–8.5)

## 2013-06-07 LAB — CBC WITH DIFFERENTIAL/PLATELET
Basophils Absolute: 0 x10E3/uL (ref 0.0–0.2)
Basos: 0 %
Eos: 9 %
Eosinophils Absolute: 0.4 x10E3/uL (ref 0.0–0.4)
HCT: 37.4 % — ABNORMAL LOW (ref 37.5–51.0)
Hemoglobin: 12.1 g/dL — ABNORMAL LOW (ref 12.6–17.7)
Immature Grans (Abs): 0 x10E3/uL (ref 0.0–0.1)
Immature Granulocytes: 0 %
Lymphocytes Absolute: 1.1 x10E3/uL (ref 0.7–3.1)
Lymphs: 23 %
MCH: 28.6 pg (ref 26.6–33.0)
MCHC: 32.4 g/dL (ref 31.5–35.7)
MCV: 88 fL (ref 79–97)
Monocytes Absolute: 0.5 x10E3/uL (ref 0.1–0.9)
Monocytes: 10 %
Neutrophils Absolute: 2.7 x10E3/uL (ref 1.4–7.0)
Neutrophils Relative %: 58 %
RBC: 4.23 x10E6/uL (ref 4.14–5.80)
RDW: 15.3 % (ref 12.3–15.4)
WBC: 4.7 x10E3/uL (ref 3.4–10.8)

## 2013-06-09 ENCOUNTER — Other Ambulatory Visit: Payer: Medicare Other

## 2013-06-12 ENCOUNTER — Other Ambulatory Visit: Payer: Self-pay | Admitting: *Deleted

## 2013-06-12 ENCOUNTER — Other Ambulatory Visit: Payer: Medicare HMO

## 2013-06-15 ENCOUNTER — Encounter: Payer: Self-pay | Admitting: Internal Medicine

## 2013-06-21 ENCOUNTER — Other Ambulatory Visit: Payer: Self-pay | Admitting: Internal Medicine

## 2013-07-04 ENCOUNTER — Other Ambulatory Visit: Payer: Self-pay | Admitting: *Deleted

## 2013-07-04 MED ORDER — CALCIUM CARBONATE-VITAMIN D 500-400 MG-UNIT PO TABS: ORAL_TABLET | ORAL | Status: DC

## 2013-07-05 ENCOUNTER — Telehealth: Payer: Self-pay

## 2013-07-05 NOTE — Telephone Encounter (Signed)
Message copied by Logan Bores on Wed Jul 05, 2013 12:48 PM ------      Message from: [place], [place] L      Created: Wed Jul 05, 2013 11:41 AM       Looks like Mr. Knicely never went for his CTA to reassess her arterial circulation to his brain.   ------

## 2013-07-05 NOTE — Telephone Encounter (Signed)
Called patient, patient states he did not go for CTA because he would need to pay $190.00 upfront, which he does not have. Patient states otherwise he would have CTA.   Patient aware I will share information with Dr.Reed

## 2013-07-06 NOTE — Telephone Encounter (Signed)
Dr.Reed was verbally informed

## 2013-07-24 ENCOUNTER — Ambulatory Visit (INDEPENDENT_AMBULATORY_CARE_PROVIDER_SITE_OTHER): Payer: Medicare HMO | Admitting: Ophthalmology

## 2013-07-24 ENCOUNTER — Other Ambulatory Visit: Payer: Self-pay | Admitting: Internal Medicine

## 2013-07-24 DIAGNOSIS — H35039 Hypertensive retinopathy, unspecified eye: Secondary | ICD-10-CM

## 2013-07-24 DIAGNOSIS — I10 Essential (primary) hypertension: Secondary | ICD-10-CM

## 2013-07-24 DIAGNOSIS — H2510 Age-related nuclear cataract, unspecified eye: Secondary | ICD-10-CM

## 2013-07-24 DIAGNOSIS — H35349 Macular cyst, hole, or pseudohole, unspecified eye: Secondary | ICD-10-CM

## 2013-07-24 DIAGNOSIS — H26499 Other secondary cataract, unspecified eye: Secondary | ICD-10-CM

## 2013-08-21 ENCOUNTER — Encounter (INDEPENDENT_AMBULATORY_CARE_PROVIDER_SITE_OTHER): Payer: Medicare HMO | Admitting: Ophthalmology

## 2013-08-21 DIAGNOSIS — H2700 Aphakia, unspecified eye: Secondary | ICD-10-CM

## 2013-09-13 ENCOUNTER — Other Ambulatory Visit: Payer: Self-pay | Admitting: *Deleted

## 2013-09-13 MED ORDER — FUROSEMIDE 20 MG PO TABS: ORAL_TABLET | ORAL | Status: DC

## 2013-09-13 MED ORDER — CLOPIDOGREL BISULFATE 75 MG PO TABS: ORAL_TABLET | ORAL | Status: DC

## 2013-10-02 ENCOUNTER — Encounter: Payer: Self-pay | Admitting: Internal Medicine

## 2013-10-02 ENCOUNTER — Ambulatory Visit (INDEPENDENT_AMBULATORY_CARE_PROVIDER_SITE_OTHER): Payer: Medicare HMO | Admitting: Internal Medicine

## 2013-10-02 VITALS — BP 144/70 | HR 56 | Temp 96.6°F | Resp 10 | Wt 189.0 lb

## 2013-10-02 DIAGNOSIS — I63239 Cerebral infarction due to unspecified occlusion or stenosis of unspecified carotid arteries: Secondary | ICD-10-CM

## 2013-10-02 DIAGNOSIS — N183 Chronic kidney disease, stage 3 unspecified: Secondary | ICD-10-CM

## 2013-10-02 DIAGNOSIS — D649 Anemia, unspecified: Secondary | ICD-10-CM

## 2013-10-02 DIAGNOSIS — I6509 Occlusion and stenosis of unspecified vertebral artery: Secondary | ICD-10-CM

## 2013-10-02 DIAGNOSIS — I5032 Chronic diastolic (congestive) heart failure: Secondary | ICD-10-CM

## 2013-10-02 DIAGNOSIS — Z23 Encounter for immunization: Secondary | ICD-10-CM

## 2013-10-02 DIAGNOSIS — I129 Hypertensive chronic kidney disease with stage 1 through stage 4 chronic kidney disease, or unspecified chronic kidney disease: Secondary | ICD-10-CM

## 2013-10-02 DIAGNOSIS — E785 Hyperlipidemia, unspecified: Secondary | ICD-10-CM

## 2013-10-02 NOTE — Assessment & Plan Note (Signed)
Prior stenting, cont plavix, asa, pravachol.

## 2013-10-02 NOTE — Assessment & Plan Note (Signed)
BP elevated today, but has been at goal at all previous visits.  Will reassess BMP.

## 2013-10-02 NOTE — Assessment & Plan Note (Signed)
Recheck cbc.  Seems this is due to chronic kidney disease.  Prior iron studies wnl.

## 2013-10-02 NOTE — Assessment & Plan Note (Addendum)
Cont lasix, losartan.  Follows with Dr Einar Gip.  Stable, asymptomatic.

## 2013-10-02 NOTE — Assessment & Plan Note (Signed)
Has been unable to get the CTA repeated to reassess this, but denies any new neurologic symptoms.  Cont secondary prevention/aggressive medical mgt as best we can as he cannot afford the test.

## 2013-10-02 NOTE — Assessment & Plan Note (Signed)
Lipids at goal before last visit.  Cont pravachol as this is most affordable for him.

## 2013-10-02 NOTE — Progress Notes (Signed)
Patient ID: [name], male   DOB: 1940/08/20, 73 y.o.   MRN: [id number]   Location:  [facility] / [facility]  Code Status: full code  Allergies  Allergen Reactions  . Penicillins     Chief Complaint  Patient presents with  . Medical Managment of Chronic Issues    4 month follow-up     HPI: Patient is a 73 y.o. black male seen in the office today for medical management of chronic diseases.  BP up today, but last several visits at goal.  No pain.  Has not been able to get CTA done due to cost (has to pay 190 up front).  Denies any new numbness, tingling, weakness, falls.  Gait is unsteady ever since stroke and stents were placed.  Is continuing plavix.    Review of Systems:  Review of Systems  Constitutional: Negative for fever, chills and weight loss.  HENT: Negative for congestion.   Eyes: Negative for blurred vision.  Respiratory: Negative for shortness of breath.   Cardiovascular: Negative for chest pain.  Gastrointestinal: Negative for abdominal pain, constipation, blood in stool and melena.  Genitourinary: Negative for dysuria.  Musculoskeletal: Negative for falls and myalgias.  Skin: Negative for rash.  Neurological: Positive for sensory change. Negative for headaches.  Psychiatric/Behavioral: Negative for depression.     Past Medical History  Diagnosis Date  . Hypertension   . Hypertensive kidney disease, benign   . Cataract     traumatic  . Gingivitis   . 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 Microscopic hematuria   . 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 Elevated prostate specific antigen (PSA)   . Unspecified vitamin D deficiency   . Depressive disorder, not elsewhere classified   . Nondependent cannabis abuse   . Hyperlipidemia   . Anemia   . Pure hypercholesterolemia   . Unspecified essential hypertension   .  Unspecified late effects of cerebrovascular disease   . Chronic diastolic heart failure   . Chronic kidney disease, stage III (moderate)   . Peripheral vascular disease, unspecified   . 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 Elevated prostate specific antigen (PSA)   . Unspecified vitamin D deficiency   . Depressive disorder, not elsewhere classified   . Unspecified late effects of cerebrovascular disease     Past Surgical History  Procedure Laterality Date  . Coronary stent placement      2008  . Cataract extraction    . Eye surgery      left eye,    cornea repair    Social History:   reports that he quit smoking about 42 years ago. He does not have any smokeless tobacco history on file. He reports that he does not drink alcohol or use illicit drugs.  Family History  Problem Relation Age of Onset  . Cancer Father     Medications: Patient's Medications  New Prescriptions   No medications on file  Previous Medications   AMLODIPINE (NORVASC) 10 MG TABLET    Take one tablet once daily for blood pressure   ASPIRIN 81 MG TABLET    Take 81 mg by mouth daily.     CALCIUM-VITAMIN D (CALCIUM 500+D HIGH POTENCY) 500-400 MG-UNIT PER TABLET    Take one tablet by mouth three times daily   CLOPIDOGREL (  PLAVIX) 75 MG TABLET    Take one tablet by mouth once daily to help with circulation   DOXAZOSIN (CARDURA) 4 MG TABLET    Take one tablet by mouth for blood pressure   FUROSEMIDE (LASIX) 20 MG TABLET    Take one tablet by mouth every morning and take one tablet every evening   LABETALOL (NORMODYNE) 300 MG TABLET    Take 300 mg by mouth 2 (two) times daily.     LOSARTAN (COZAAR) 50 MG TABLET    Take one tablet once daily for blood pressure   PRAVASTATIN (PRAVACHOL) 80 MG TABLET    Take one tablet once daily to control cholesterol  Modified Medications   No medications on file  Discontinued Medications   No medications on file      Physical Exam: Filed Vitals:   10/02/13 0823  BP: 144/70  Pulse: 56  Temp: 96.6 F (35.9 C)  TempSrc: Oral  Resp: 10  Weight: 189 lb (85.73 kg)  SpO2: 97%  Physical Exam  Constitutional: He is oriented to person, place, and time. He appears well-developed and well-nourished. No distress.  Neck: Neck supple. No JVD present.  Cardiovascular: Normal rate and regular rhythm.   Murmur heard. Pulmonary/Chest: Effort normal and breath sounds normal. No respiratory distress. He has no wheezes.  Abdominal: Soft. Bowel sounds are normal. He exhibits no distension and no mass. There is no tenderness.  Musculoskeletal: Normal range of motion.  Neurological: He is alert and oriented to person, place, and time.  Skin: Skin is warm and dry.  Very dry skin  Psychiatric: He has a normal mood and affect.   Labs reviewed: Basic Metabolic Panel:  Recent Labs  01/17/13 0921 06/06/13 0908  NA 140 143  K 4.2 4.5  CL 105 105  CO2 22 22  GLUCOSE 101* 103*  BUN 21 23  CREATININE 1.44* 1.38*  CALCIUM 9.4 9.5   Liver Function Tests:  Recent Labs  06/06/13 0908  AST 19  ALT 11  ALKPHOS 68  BILITOT <0.2  PROT 6.7  CBC:  Recent Labs  01/17/13 0921 06/06/13 0908  WBC 4.7 4.7  NEUTROABS 2.2 2.7  HGB 11.9* 12.1*  HCT 36.6* 37.4*  MCV 87 88   Lipid Panel:  Recent Labs  06/06/13 0908  HDL 45  LDLCALC 76  TRIG 82  CHOLHDL 3.0   Assessment/Plan Carotid stenosis, symptomatic, with infarction Prior stenting, cont plavix, asa, pravachol.   Chronic diastolic heart failure Cont lasix, losartan.  Follows with Dr Einar Gip.  Stable, asymptomatic.  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  Cont secondary prevention/aggressive medical mgt as best we can as he cannot afford the test.    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  Will reassess BMP.  Chronic kidney disease, stage III (moderate) F/u bmp and cbc  Anemia Recheck cbc.  Seems this is due to chronic kidney disease.  Prior iron studies wnl.  Hyperlipidemia LDL goal < 100 Lipids at goal before last visit.  Cont pravachol as this is most affordable for him.  Need for 13-valent pneumococcal vaccine: prevnar shot given today  Labs/tests ordered: cbc, bmp  Next appt:  4 mos

## 2013-10-02 NOTE — Assessment & Plan Note (Signed)
F/u bmp and cbc

## 2013-10-03 LAB — CBC WITH DIFFERENTIAL/PLATELET
Basophils Absolute: 0 10*3/uL (ref 0.0–0.2)
Basos: 0 %
Eos: 14 %
Eosinophils Absolute: 0.8 10*3/uL — ABNORMAL HIGH (ref 0.0–0.4)
HCT: 36.4 % — ABNORMAL LOW (ref 37.5–51.0)
Hemoglobin: 11.8 g/dL — ABNORMAL LOW (ref 12.6–17.7)
Immature Grans (Abs): 0 10*3/uL (ref 0.0–0.1)
Immature Granulocytes: 0 %
Lymphocytes Absolute: 1.4 10*3/uL (ref 0.7–3.1)
Lymphs: 25 %
MCH: 28 pg (ref 26.6–33.0)
MCHC: 32.4 g/dL (ref 31.5–35.7)
MCV: 86 fL (ref 79–97)
Monocytes Absolute: 0.5 10*3/uL (ref 0.1–0.9)
Monocytes: 10 %
Neutrophils Absolute: 2.7 10*3/uL (ref 1.4–7.0)
Neutrophils Relative %: 51 %
RBC: 4.22 x10E6/uL (ref 4.14–5.80)
RDW: 14.7 % (ref 12.3–15.4)
WBC: 5.4 10*3/uL (ref 3.4–10.8)

## 2013-10-03 LAB — BASIC METABOLIC PANEL
BUN/Creatinine Ratio: 23 — ABNORMAL HIGH (ref 10–22)
BUN: 35 mg/dL — ABNORMAL HIGH (ref 8–27)
CO2: 26 mmol/L (ref 18–29)
Calcium: 9.5 mg/dL (ref 8.6–10.2)
Chloride: 100 mmol/L (ref 97–108)
Creatinine, Ser: 1.51 mg/dL — ABNORMAL HIGH (ref 0.76–1.27)
GFR calc Af Amer: 53 mL/min/{1.73_m2} — ABNORMAL LOW (ref >59)
GFR calc non Af Amer: 45 mL/min/{1.73_m2} — ABNORMAL LOW (ref >59)
Glucose: 100 mg/dL — ABNORMAL HIGH (ref 65–99)
Potassium: 4.5 mmol/L (ref 3.5–5.2)
Sodium: 140 mmol/L (ref 134–144)

## 2013-10-22 ENCOUNTER — Other Ambulatory Visit: Payer: Self-pay | Admitting: Nurse Practitioner

## 2013-12-19 ENCOUNTER — Other Ambulatory Visit: Payer: Self-pay | Admitting: *Deleted

## 2013-12-19 MED ORDER — FUROSEMIDE 20 MG PO TABS: ORAL_TABLET | ORAL | Status: DC

## 2013-12-19 MED ORDER — CLOPIDOGREL BISULFATE 75 MG PO TABS: ORAL_TABLET | ORAL | Status: DC

## 2013-12-19 NOTE — Telephone Encounter (Signed)
[employer]

## 2014-02-05 ENCOUNTER — Ambulatory Visit (INDEPENDENT_AMBULATORY_CARE_PROVIDER_SITE_OTHER): Payer: Medicare HMO | Admitting: Internal Medicine

## 2014-02-05 ENCOUNTER — Encounter: Payer: Self-pay | Admitting: Internal Medicine

## 2014-02-05 VITALS — BP 130/64 | HR 53 | Temp 97.9°F | Resp 10 | Wt 178.8 lb

## 2014-02-05 DIAGNOSIS — E785 Hyperlipidemia, unspecified: Secondary | ICD-10-CM

## 2014-02-05 DIAGNOSIS — D631 Anemia in chronic kidney disease: Secondary | ICD-10-CM

## 2014-02-05 DIAGNOSIS — I63239 Cerebral infarction due to unspecified occlusion or stenosis of unspecified carotid arteries: Secondary | ICD-10-CM

## 2014-02-05 DIAGNOSIS — I129 Hypertensive chronic kidney disease with stage 1 through stage 4 chronic kidney disease, or unspecified chronic kidney disease: Secondary | ICD-10-CM

## 2014-02-05 DIAGNOSIS — I5032 Chronic diastolic (congestive) heart failure: Secondary | ICD-10-CM

## 2014-02-05 DIAGNOSIS — N183 Chronic kidney disease, stage 3 unspecified: Secondary | ICD-10-CM

## 2014-02-05 DIAGNOSIS — N189 Chronic kidney disease, unspecified: Secondary | ICD-10-CM

## 2014-02-05 DIAGNOSIS — N039 Chronic nephritic syndrome with unspecified morphologic changes: Secondary | ICD-10-CM

## 2014-02-05 NOTE — Progress Notes (Signed)
Patient ID: [name], male   DOB: [birth year], 73 y.o.   MRN: [id number]   Location:  [facility]  Code Status: full code  Allergies  Allergen Reactions  . Penicillins     Chief Complaint  Patient presents with  . Medical Management of Chronic Issues    4 month follow-up, not fasting today     HPI: Patient is a 73 y.o. black male seen in the office today for medical mgt of chronic diseases.  No new problems have developed.    Went to "the prostate doctor" and no changes made at that visit.  5/27 for BPH LUTS, microhematuria.  Checked out well and to f/u in 1 year.    Saw eye doctor early this year and sees annually.  No eyeglasses.  Wanted to see if got better first.    Review of Systems:  Review of Systems  Constitutional:       Good appetite  HENT: Negative for congestion.   Eyes: Positive for blurred vision.       Had left eye surgery and 20/40, blurry daily--last seen eye doctor early this year  Respiratory: Negative for shortness of breath.   Cardiovascular: Negative for chest pain.  Gastrointestinal: Negative for heartburn, constipation, blood in stool and melena.  Genitourinary: Negative for dysuria.  Musculoskeletal: Negative for falls and myalgias.  Skin: Negative for itching and rash.  Neurological: Negative for dizziness, loss of consciousness and headaches.       Balance good  Psychiatric/Behavioral: Negative for depression and memory loss. The patient does not have insomnia.     Past Medical History  Diagnosis Date  . Hypertension   . Hypertensive kidney disease, benign   . Cataract     traumatic  . Gingivitis   . 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 Microscopic hematuria   . 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 Elevated prostate specific antigen (PSA)   . Unspecified vitamin D deficiency   .  Depressive disorder, not elsewhere classified   . Nondependent cannabis abuse   . Hyperlipidemia   . Anemia   . Pure hypercholesterolemia   . Unspecified essential hypertension   . Unspecified late effects of cerebrovascular disease   . Chronic diastolic heart failure   . Chronic kidney disease, stage III (moderate)   . Peripheral vascular disease, unspecified   . 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 Elevated prostate specific antigen (PSA)   . Unspecified vitamin D deficiency   . Depressive disorder, not elsewhere classified   . Unspecified late effects of cerebrovascular disease     Past Surgical History  Procedure Laterality Date  . Coronary stent placement      2008  . Cataract extraction    . Eye surgery      left eye,    cornea repair    Social History:   reports that he quit smoking about 42 years ago. He does not have any smokeless tobacco history on file. He reports that he does not drink alcohol or use illicit drugs.  Family History  Problem Relation Age of Onset  . Cancer Father     Medications: Patient's Medications  New Prescriptions   No medications on file  Previous Medications   AMLODIPINE (NORVASC) 10 MG TABLET    Take one  tablet once daily for blood pressure   ASPIRIN 81 MG TABLET    Take 81 mg by mouth daily.     CALCIUM-VITAMIN D (CALCIUM 500+D HIGH POTENCY) 500-400 MG-UNIT PER TABLET    Take one tablet by mouth three times daily   CLOPIDOGREL (PLAVIX) 75 MG TABLET    Take one tablet by mouth once daily to help with circulation   DOXAZOSIN (CARDURA) 4 MG TABLET    Take one tablet by mouth for blood pressure   FUROSEMIDE (LASIX) 20 MG TABLET    Take one tablet by mouth every morning and take one tablet every evening   LABETALOL (NORMODYNE) 300 MG TABLET    Take 300 mg by mouth 2 (two) times daily.     LOSARTAN (COZAAR) 50 MG TABLET    Take one tablet once daily for blood pressure    PRAVASTATIN (PRAVACHOL) 80 MG TABLET    Take one tablet once daily to control cholesterol  Modified Medications   No medications on file  Discontinued Medications   No medications on file     Physical Exam: Filed Vitals:   02/05/14 0827  BP: 130/64  Pulse: 53  Temp: 97.9 F (36.6 C)  TempSrc: Oral  Resp: 10  Weight: 178 lb 12.8 oz (81.103 kg)  SpO2: 96%  Physical Exam  Constitutional: He is oriented to person, place, and time. He appears well-developed and well-nourished. No distress.  HENT:  Poor dentition  Neck: Neck supple. No JVD present.  Cardiovascular: Normal rate, regular rhythm and intact distal pulses.   Murmur heard. Pulmonary/Chest: Effort normal and breath sounds normal. No respiratory distress.  Abdominal: Soft. Bowel sounds are normal. He exhibits no distension and no mass. There is no tenderness.  Musculoskeletal: He exhibits no edema and no tenderness.  Lymphadenopathy:    He has no cervical adenopathy.  Neurological: He is alert and oriented to person, place, and time.  Skin: Skin is warm and dry.  Has thickened 5th toenail of left foot  Psychiatric: He has a normal mood and affect.    Labs reviewed: Basic Metabolic Panel:  Recent Labs  06/06/13 0908 10/02/13 0902  NA 143 140  K 4.5 4.5  CL 105 100  CO2 22 26  GLUCOSE 103* 100*  BUN 23 35*  CREATININE 1.38* 1.51*  CALCIUM 9.5 9.5   Liver Function Tests:  Recent Labs  06/06/13 0908  AST 19  ALT 11  ALKPHOS 68  BILITOT <0.2  PROT 6.7  CBC:  Recent Labs  06/06/13 0908 10/02/13 0902  WBC 4.7 5.4  NEUTROABS 2.7 2.7  HGB 12.1* 11.8*  HCT 37.4* 36.4*  MCV 88 86   Lipid Panel:  Recent Labs  06/06/13 0908  HDL 45  LDLCALC 76  TRIG 82  CHOLHDL 3.0   Assessment/Plan 1. Carotid stenosis, symptomatic, with infarction -follows with Dr. Einar Gip on this -has been unable to afford CTA which was not covered by his insurance to f/u on this  2. Chronic diastolic heart  failure -stable, no signs of acute flare  3. Chronic kidney disease, stage III (moderate) -f/u bmp today  4. 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 Anemia in chronic renal disease -blood counts have been stable  6. Hyperlipidemia -lipids have been satisfactory with statin therapy  Labs/tests ordered:  bmp Next appt:  4 mos

## 2014-02-06 LAB — BASIC METABOLIC PANEL
BUN/Creatinine Ratio: 17 (ref 10–22)
BUN: 22 mg/dL (ref 8–27)
CO2: 23 mmol/L (ref 18–29)
Calcium: 10 mg/dL (ref 8.6–10.2)
Chloride: 101 mmol/L (ref 97–108)
Creatinine, Ser: 1.28 mg/dL — ABNORMAL HIGH (ref 0.76–1.27)
GFR calc Af Amer: 64 mL/min/{1.73_m2} (ref >59)
GFR calc non Af Amer: 56 mL/min/{1.73_m2} — ABNORMAL LOW (ref >59)
Glucose: 97 mg/dL (ref 65–99)
Potassium: 4.5 mmol/L (ref 3.5–5.2)
Sodium: 143 mmol/L (ref 134–144)

## 2014-02-09 ENCOUNTER — Encounter: Payer: Self-pay | Admitting: *Deleted

## 2014-06-11 ENCOUNTER — Ambulatory Visit: Payer: Commercial Managed Care - HMO | Admitting: Internal Medicine

## 2014-06-20 ENCOUNTER — Encounter: Payer: Self-pay | Admitting: *Deleted

## 2014-06-20 ENCOUNTER — Telehealth: Payer: Self-pay | Admitting: *Deleted

## 2014-06-20 NOTE — Telephone Encounter (Signed)
[facility] Dr. Lavell Anchors done 06/12/2014  Left ventricle cavity is normal in size. Moderate concentric hypertrophy of the left ventricle. Normal global wall motion. Doppler evidence of grade II diastolic dysfunction. Diastolic dysfunction findings suggests elevated LA/LV end diastolic pressure. Calculated EF 69%. Left atrial cavity is mildly dilated. Moderate aortic regurgitation. Mild aortic valve leaflet calcification. Mildly restricted aortic valve leaflets. Trace aortic valve stenosis.  Report given to Dr. Mariea Clonts to review and sign.

## 2014-07-09 ENCOUNTER — Encounter: Payer: Self-pay | Admitting: Internal Medicine

## 2014-07-09 ENCOUNTER — Ambulatory Visit (INDEPENDENT_AMBULATORY_CARE_PROVIDER_SITE_OTHER): Payer: Medicare HMO

## 2014-07-09 ENCOUNTER — Ambulatory Visit (INDEPENDENT_AMBULATORY_CARE_PROVIDER_SITE_OTHER): Payer: Medicare HMO | Admitting: Internal Medicine

## 2014-07-09 VITALS — BP 150/72 | HR 57 | Temp 97.3°F | Resp 10 | Ht 69.0 in | Wt 178.0 lb

## 2014-07-09 DIAGNOSIS — Z23 Encounter for immunization: Secondary | ICD-10-CM

## 2014-07-09 DIAGNOSIS — I639 Cerebral infarction, unspecified: Secondary | ICD-10-CM

## 2014-07-09 DIAGNOSIS — N183 Chronic kidney disease, stage 3 unspecified: Secondary | ICD-10-CM

## 2014-07-09 DIAGNOSIS — I5032 Chronic diastolic (congestive) heart failure: Secondary | ICD-10-CM

## 2014-07-09 DIAGNOSIS — I63239 Cerebral infarction due to unspecified occlusion or stenosis of unspecified carotid arteries: Secondary | ICD-10-CM

## 2014-07-09 DIAGNOSIS — D631 Anemia in chronic kidney disease: Secondary | ICD-10-CM

## 2014-07-09 DIAGNOSIS — N189 Chronic kidney disease, unspecified: Secondary | ICD-10-CM

## 2014-07-09 DIAGNOSIS — I10 Essential (primary) hypertension: Secondary | ICD-10-CM

## 2014-07-09 DIAGNOSIS — E785 Hyperlipidemia, unspecified: Secondary | ICD-10-CM

## 2014-07-09 MED ORDER — CALCIUM CARBONATE-VITAMIN D 500-400 MG-UNIT PO TABS: 1.0000 | ORAL_TABLET | Freq: Two times a day (BID) | ORAL | Status: DC

## 2014-07-09 NOTE — Progress Notes (Signed)
Patient ID: [name], male   DOB: 11-08-1940, 73 y.o.   MRN: [id number]   Location:  [name]   Allergies  Allergen Reactions  . Penicillins     Chief Complaint  Patient presents with  . Medical Management of Chronic Issues    4 month follow-up, no recent labs (not fasting)     HPI: Patient is a 73 y.o. black male seen in the office today for medical mgt of his chronic diseases.    Says he has no new concerns.  Ate sausage on a bun for breakfast.  Normally doesn't eat that.  Had a craving for it.  Hadn't had one for about a month.  Once in a blue moon, he'll eat a piece of ham, too.    Review of Systems:  Review of Systems  Constitutional: Negative for fever, chills and weight loss.  HENT: Negative for congestion and hearing loss.   Eyes: Negative for blurred vision.  Respiratory: Negative for shortness of breath.   Cardiovascular: Negative for chest pain, palpitations and leg swelling.  Gastrointestinal: Negative for abdominal pain, constipation, blood in stool and melena.  Genitourinary: Negative for dysuria.  Musculoskeletal: Negative for falls.  Skin: Negative for rash.  Neurological: Positive for dizziness. Negative for focal weakness, loss of consciousness and weakness.  Endo/Heme/Allergies: Does not bruise/bleed easily.  Psychiatric/Behavioral: Negative for depression and memory loss.     Past Medical History  Diagnosis Date  . Hypertension   . Hypertensive kidney disease, benign   . Cataract     traumatic  . Gingivitis   . 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 Microscopic hematuria   . 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 Elevated prostate specific antigen (PSA)   . Unspecified vitamin D deficiency   . Depressive disorder, not elsewhere classified   . Nondependent cannabis abuse   . Hyperlipidemia   .  Anemia   . Pure hypercholesterolemia   . Unspecified essential hypertension   . Unspecified late effects of cerebrovascular disease   . Chronic diastolic heart failure   . Chronic kidney disease, stage III (moderate)   . Peripheral vascular disease, unspecified   . 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 Elevated prostate specific antigen (PSA)   . Unspecified vitamin D deficiency   . Depressive disorder, not elsewhere classified   . Unspecified late effects of cerebrovascular disease     Past Surgical History  Procedure Laterality Date  . Coronary stent placement      2008  . Cataract extraction    . Eye surgery      left eye,    cornea repair    Social History:   reports that he quit smoking about 43 years ago. He does not have any smokeless tobacco history on file. He reports that he does not drink alcohol or use illicit drugs.  Family History  Problem Relation Age of Onset  . Cancer Father     Medications: Patient's Medications  New Prescriptions   No medications on file  Previous Medications   AMLODIPINE (NORVASC) 10 MG TABLET    Take one tablet once daily for blood pressure   ASPIRIN 81 MG TABLET    Take 81 mg by mouth daily.     CALCIUM-VITAMIN D (CALCIUM 500+D HIGH POTENCY) 500-400 MG-UNIT  PER TABLET    Take one tablet by mouth three times daily   CLOPIDOGREL (PLAVIX) 75 MG TABLET    Take one tablet by mouth once daily to help with circulation   DOXAZOSIN (CARDURA) 4 MG TABLET    Take one tablet by mouth for blood pressure   FUROSEMIDE (LASIX) 20 MG TABLET    Take one tablet by mouth every morning and take one tablet every evening   LABETALOL (NORMODYNE) 300 MG TABLET    Take 300 mg by mouth 2 (two) times daily.     LOSARTAN (COZAAR) 50 MG TABLET    Take one tablet once daily for blood pressure   PRAVASTATIN (PRAVACHOL) 80 MG TABLET    Take one tablet once daily to control cholesterol  Modified Medications    No medications on file  Discontinued Medications   No medications on file     Physical Exam: Filed Vitals:   07/09/14 0832  BP: 150/72  Pulse: 57  Temp: 97.3 F (36.3 C)  TempSrc: Oral  Resp: 10  Height: 5\' 9"  (1.753 m)  Weight: 178 lb (80.74 kg)  SpO2: 99%  Physical Exam  Constitutional: He is oriented to person, place, and time. He appears well-developed and well-nourished. No distress.  Cardiovascular: Normal rate, regular rhythm and intact distal pulses.   Murmur heard. Pulmonary/Chest: Effort normal and breath sounds normal. No respiratory distress.  Abdominal: Soft. Bowel sounds are normal. He exhibits no distension and no mass. There is no tenderness.  Musculoskeletal: Normal range of motion.  Neurological: He is alert and oriented to person, place, and time.  Skin: Skin is warm and dry.  Psychiatric: He has a normal mood and affect.     Labs reviewed: Basic Metabolic Panel:  Recent Labs  10/02/13 0902 02/05/14 0912  NA 140 143  K 4.5 4.5  CL 100 101  CO2 26 23  GLUCOSE 100* 97  BUN 35* 22  CREATININE 1.51* 1.28*  CALCIUM 9.5 10.0   CBC:  Recent Labs  10/02/13 0902  WBC 5.4  NEUTROABS 2.7  HGB 11.8*  HCT 36.4*  MCV 86   Assessment/Plan 1. Carotid stenosis, symptomatic, with infarction -with prior stroke, getting at least annual us--has difficulty with cost of studies - Hemoglobin A1c; Future  2. Chronic diastolic heart failure -cont lasix, labetalol, losartan, has not required potassium supplement  3. Chronic kidney disease, stage III (moderate) - f/u labs -avoid nsaids - Hemoglobin A1c; Future  4. Anemia in chronic renal disease - f/u labs: - CBC With differential/Platelet; Future - Comprehensive metabolic panel; Future  5. Hyperlipidemia -cont statin therapy and f/u labs - Comprehensive metabolic panel; Future - Lipid panel; Future - Hemoglobin A1c; Future  6. Essential hypertension -bp elevated today, but he ate sausage  on a bun this morning for breakfast--discussed not eating this regularly and he doesn't--normally avoids high sodium foods - Comprehensive metabolic panel; Future  7. Need for prophylactic vaccination and inoculation against influenza -convinced him to get flu shot   Labs/tests ordered: to return tomorrow am for labs Next appt:  4 mos  Fabio Wah L. Aleksei Goodlin, D.O. [facility] [address], [place] [address] Cell Phone (Mon-Fri 8am-5pm):  [phone] On Call:  [phone] & follow prompts after 5pm & weekends Office Phone:  [phone] Office Fax:  [phone]

## 2014-07-10 ENCOUNTER — Other Ambulatory Visit: Payer: Medicare HMO

## 2014-07-10 DIAGNOSIS — I63239 Cerebral infarction due to unspecified occlusion or stenosis of unspecified carotid arteries: Secondary | ICD-10-CM

## 2014-07-10 DIAGNOSIS — N183 Chronic kidney disease, stage 3 unspecified: Secondary | ICD-10-CM

## 2014-07-10 DIAGNOSIS — E785 Hyperlipidemia, unspecified: Secondary | ICD-10-CM

## 2014-07-10 DIAGNOSIS — N189 Chronic kidney disease, unspecified: Principal | ICD-10-CM

## 2014-07-10 DIAGNOSIS — D631 Anemia in chronic kidney disease: Secondary | ICD-10-CM

## 2014-07-10 DIAGNOSIS — I10 Essential (primary) hypertension: Secondary | ICD-10-CM

## 2014-07-11 LAB — COMPREHENSIVE METABOLIC PANEL
ALT: 15 IU/L (ref 0–44)
AST: 25 IU/L (ref 0–40)
Albumin/Globulin Ratio: 1.4 (ref 1.1–2.5)
Albumin: 4.1 g/dL (ref 3.5–4.8)
Alkaline Phosphatase: 70 IU/L (ref 39–117)
BUN/Creatinine Ratio: 14 (ref 10–22)
BUN: 15 mg/dL (ref 8–27)
CO2: 25 mmol/L (ref 18–29)
Calcium: 9.1 mg/dL (ref 8.6–10.2)
Chloride: 104 mmol/L (ref 97–108)
Creatinine, Ser: 1.1 mg/dL (ref 0.76–1.27)
GFR calc Af Amer: 77 mL/min/{1.73_m2} (ref >59)
GFR calc non Af Amer: 66 mL/min/{1.73_m2} (ref >59)
Globulin, Total: 2.9 g/dL (ref 1.5–4.5)
Glucose: 101 mg/dL — ABNORMAL HIGH (ref 65–99)
Potassium: 4.1 mmol/L (ref 3.5–5.2)
Sodium: 141 mmol/L (ref 134–144)
Total Bilirubin: 0.3 mg/dL (ref 0.0–1.2)
Total Protein: 7 g/dL (ref 6.0–8.5)

## 2014-07-11 LAB — CBC WITH DIFFERENTIAL
Basophils Absolute: 0 10*3/uL (ref 0.0–0.2)
Basos: 0 %
Eos: 13 %
Eosinophils Absolute: 0.6 10*3/uL — ABNORMAL HIGH (ref 0.0–0.4)
HCT: 36 % — ABNORMAL LOW (ref 37.5–51.0)
Hemoglobin: 11.4 g/dL — ABNORMAL LOW (ref 12.6–17.7)
Immature Grans (Abs): 0 10*3/uL (ref 0.0–0.1)
Immature Granulocytes: 0 %
Lymphocytes Absolute: 1.2 10*3/uL (ref 0.7–3.1)
Lymphs: 26 %
MCH: 27.9 pg (ref 26.6–33.0)
MCHC: 31.7 g/dL (ref 31.5–35.7)
MCV: 88 fL (ref 79–97)
Monocytes Absolute: 0.4 10*3/uL (ref 0.1–0.9)
Monocytes: 9 %
Neutrophils Absolute: 2.5 10*3/uL (ref 1.4–7.0)
Neutrophils Relative %: 52 %
Platelets: 276 10*3/uL (ref 150–379)
RBC: 4.08 x10E6/uL — ABNORMAL LOW (ref 4.14–5.80)
RDW: 14.8 % (ref 12.3–15.4)
WBC: 4.8 10*3/uL (ref 3.4–10.8)

## 2014-07-11 LAB — LIPID PANEL
Chol/HDL Ratio: 3 ratio units (ref 0.0–5.0)
Cholesterol, Total: 161 mg/dL (ref 100–199)
HDL: 53 mg/dL (ref >39)
LDL Calculated: 92 mg/dL (ref 0–99)
Triglycerides: 78 mg/dL (ref 0–149)
VLDL Cholesterol Cal: 16 mg/dL (ref 5–40)

## 2014-07-11 LAB — HEMOGLOBIN A1C
Est. average glucose Bld gHb Est-mCnc: 128 mg/dL
Hgb A1c MFr Bld: 6.1 % — ABNORMAL HIGH (ref 4.8–5.6)

## 2014-07-12 ENCOUNTER — Other Ambulatory Visit: Payer: Self-pay | Admitting: *Deleted

## 2014-07-18 ENCOUNTER — Encounter: Payer: Self-pay | Admitting: *Deleted

## 2014-08-01 ENCOUNTER — Other Ambulatory Visit: Payer: Self-pay | Admitting: *Deleted

## 2014-08-01 MED ORDER — LABETALOL HCL 300 MG PO TABS: 300.0000 mg | ORAL_TABLET | Freq: Two times a day (BID) | ORAL | Status: DC

## 2014-08-01 MED ORDER — FUROSEMIDE 20 MG PO TABS: ORAL_TABLET | ORAL | Status: DC

## 2014-08-01 MED ORDER — AMLODIPINE BESYLATE 10 MG PO TABS: ORAL_TABLET | ORAL | Status: DC

## 2014-08-01 MED ORDER — CLOPIDOGREL BISULFATE 75 MG PO TABS: ORAL_TABLET | ORAL | Status: DC

## 2014-08-01 MED ORDER — PRAVASTATIN SODIUM 80 MG PO TABS: ORAL_TABLET | ORAL | Status: DC

## 2014-08-01 MED ORDER — DOXAZOSIN MESYLATE 4 MG PO TABS
ORAL_TABLET | ORAL | Status: DC
Start: 2014-08-01 — End: 2014-08-02

## 2014-08-01 MED ORDER — LOSARTAN POTASSIUM 50 MG PO TABS: ORAL_TABLET | ORAL | Status: DC

## 2014-08-01 NOTE — Telephone Encounter (Signed)
Patient dropped off invoice form and wanted medications faxed to [facility] Rx's E-scribed.

## 2014-08-02 ENCOUNTER — Other Ambulatory Visit: Payer: Self-pay | Admitting: *Deleted

## 2014-08-02 MED ORDER — DOXAZOSIN MESYLATE 4 MG PO TABS: ORAL_TABLET | ORAL | Status: DC

## 2014-08-02 NOTE — Telephone Encounter (Signed)
[facility] fax order

## 2014-08-06 ENCOUNTER — Other Ambulatory Visit: Payer: Self-pay | Admitting: *Deleted

## 2014-08-06 MED ORDER — DOXAZOSIN MESYLATE 4 MG PO TABS: ORAL_TABLET | ORAL | Status: DC

## 2014-08-06 NOTE — Telephone Encounter (Signed)
[facility] 

## 2014-08-27 ENCOUNTER — Ambulatory Visit (INDEPENDENT_AMBULATORY_CARE_PROVIDER_SITE_OTHER): Payer: Medicare HMO | Admitting: Ophthalmology

## 2014-08-27 DIAGNOSIS — H43811 Vitreous degeneration, right eye: Secondary | ICD-10-CM | POA: Diagnosis not present

## 2014-08-27 DIAGNOSIS — I10 Essential (primary) hypertension: Secondary | ICD-10-CM

## 2014-08-27 DIAGNOSIS — H35033 Hypertensive retinopathy, bilateral: Secondary | ICD-10-CM | POA: Diagnosis not present

## 2014-08-27 DIAGNOSIS — H35342 Macular cyst, hole, or pseudohole, left eye: Secondary | ICD-10-CM

## 2014-11-12 ENCOUNTER — Ambulatory Visit (INDEPENDENT_AMBULATORY_CARE_PROVIDER_SITE_OTHER): Payer: Commercial Managed Care - HMO | Admitting: Internal Medicine

## 2014-11-12 ENCOUNTER — Encounter: Payer: Self-pay | Admitting: Internal Medicine

## 2014-11-12 VITALS — BP 138/56 | HR 60 | Temp 98.1°F | Resp 16 | Ht 69.0 in | Wt 176.4 lb

## 2014-11-12 DIAGNOSIS — N183 Chronic kidney disease, stage 3 unspecified: Secondary | ICD-10-CM

## 2014-11-12 DIAGNOSIS — R739 Hyperglycemia, unspecified: Secondary | ICD-10-CM | POA: Insufficient documentation

## 2014-11-12 DIAGNOSIS — I5032 Chronic diastolic (congestive) heart failure: Secondary | ICD-10-CM | POA: Diagnosis not present

## 2014-11-12 DIAGNOSIS — D631 Anemia in chronic kidney disease: Secondary | ICD-10-CM | POA: Insufficient documentation

## 2014-11-12 DIAGNOSIS — E785 Hyperlipidemia, unspecified: Secondary | ICD-10-CM

## 2014-11-12 DIAGNOSIS — N189 Chronic kidney disease, unspecified: Secondary | ICD-10-CM

## 2014-11-12 DIAGNOSIS — I639 Cerebral infarction, unspecified: Secondary | ICD-10-CM | POA: Diagnosis not present

## 2014-11-12 DIAGNOSIS — I63239 Cerebral infarction due to unspecified occlusion or stenosis of unspecified carotid arteries: Secondary | ICD-10-CM

## 2014-11-12 NOTE — Progress Notes (Signed)
Patient ID: [name], male   DOB: 1940/12/28, 74 y.o.   MRN: [id number]   Location:  [facility] / [facility]  Code Status: full code Goals of Care: Advanced Directive information Does patient have an advance directive?: No, Would patient like information on creating an advanced directive?: No - patient declined information   Allergies  Allergen Reactions  . Penicillins     Chief Complaint  Patient presents with  . Medical Management of Chronic Issues    4 month follow-up, no recent labs (not fasting)    HPI: Patient is a 74 y.o. black male seen in the office today for med mgt of chronic diseases.  He refuses most everything.  He has not gotten labs and isn't fasting for any, but did have some last visit with encouragement.    Was prediabetic on his last labs--monitoring and diet was discussed.    His BP was rechecked by me and came down to 138/56.  He did have a chicken on a bun.  He did take his meds this am.  He has been avoiding starchier foods he says including biscuits.    He gets his carotid doppler annually, but could not afford an MRI to be done.    Review of Systems:  Review of Systems  Constitutional: Negative for fever and chills.  HENT: Negative for congestion.   Respiratory: Negative for shortness of breath.   Cardiovascular: Negative for chest pain and leg swelling.  Gastrointestinal: Negative for abdominal pain, constipation, blood in stool and melena.  Genitourinary: Negative for dysuria, urgency and frequency.  Musculoskeletal: Negative for myalgias and falls.  Skin: Negative for rash.  Neurological: Negative for dizziness.  Endo/Heme/Allergies: Does not bruise/bleed easily.  Psychiatric/Behavioral: Negative for depression and memory loss.     Past Medical History  Diagnosis Date  . Hypertension   . Hypertensive kidney disease, benign   . Cataract     traumatic  . Gingivitis   . Occlusion and stenosis of carotid  artery without mention of cerebral infarction   . Microscopic hematuria   . 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 Elevated prostate specific antigen (PSA)   . Unspecified vitamin D deficiency   . Depressive disorder, not elsewhere classified   . Nondependent cannabis abuse   . Hyperlipidemia   . Anemia   . Pure hypercholesterolemia   . Unspecified essential hypertension   . Unspecified late effects of cerebrovascular disease   . Chronic diastolic heart failure   . Chronic kidney disease, stage III (moderate)   . Peripheral vascular disease, unspecified   . 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 Elevated prostate specific antigen (PSA)   . Unspecified vitamin D deficiency   . Depressive disorder, not elsewhere classified   . Unspecified late effects of cerebrovascular disease     Past Surgical History  Procedure Laterality Date  . Coronary stent placement      2008  . Cataract extraction    . Eye surgery      left eye,    cornea repair    Social History:   reports that he quit smoking about 43 years ago. He does not have any smokeless tobacco history on file. He reports that he does not drink alcohol or use illicit drugs.  Family History  Problem Relation Age of Onset  . Cancer Father  Medications: Patient's Medications  New Prescriptions   No medications on file  Previous Medications   AMLODIPINE (NORVASC) 10 MG TABLET    Take one tablet once daily for blood pressure   ASPIRIN 81 MG TABLET    Take 81 mg by mouth daily.     CALCIUM CARB-CHOLECALCIFEROL (CALCIUM 600 + D PO)    Take by mouth 2 (two) times daily.   CLOPIDOGREL (PLAVIX) 75 MG TABLET    Take one tablet by mouth once daily to help with circulation   DOXAZOSIN (CARDURA) 4 MG TABLET    Take one tablet by mouth once daily for blood pressure   FUROSEMIDE (LASIX) 20 MG TABLET    Take one  tablet by mouth every morning and take one tablet by mouth every evening   LABETALOL (NORMODYNE) 300 MG TABLET    Take 1 tablet (300 mg total) by mouth 2 (two) times daily.   LOSARTAN (COZAAR) 50 MG TABLET    Take one tablet once daily for blood pressure   PRAVASTATIN (PRAVACHOL) 80 MG TABLET    Take one tablet once daily to control cholesterol  Modified Medications   No medications on file  Discontinued Medications   CALCIUM-VITAMIN D (CALCIUM 500+D HIGH POTENCY) 500-400 MG-UNIT PER TABLET    Take 1 tablet by mouth 2 (two) times daily.     Physical Exam: Filed Vitals:   11/12/14 0843  BP: 144/68  Pulse: 60  Temp: 98.1 F (36.7 C)  TempSrc: Oral  Resp: 16  Height: 5\' 9"  (1.753 m)  Weight: 176 lb 6.4 oz (80.015 kg)  SpO2: 98%  Physical Exam  Constitutional: He is oriented to person, place, and time. He appears well-developed and well-nourished.  Cardiovascular: Normal rate, regular rhythm and intact distal pulses.   Murmur heard. Pulmonary/Chest: Effort normal and breath sounds normal. No respiratory distress.  Abdominal: Soft. Bowel sounds are normal. He exhibits no distension and no mass. There is no tenderness.  Musculoskeletal: Normal range of motion.  Neurological: He is alert and oriented to person, place, and time.  Skin: Skin is warm and dry.  Psychiatric: He has a normal mood and affect.    Labs reviewed: Basic Metabolic Panel:  Recent Labs  02/05/14 0912 07/10/14 0819  NA 143 141  K 4.5 4.1  CL 101 104  CO2 23 25  GLUCOSE 97 101*  BUN 22 15  CREATININE 1.28* 1.10  CALCIUM 10.0 9.1   Liver Function Tests:  Recent Labs  07/10/14 0819  AST 25  ALT 15  ALKPHOS 70  BILITOT 0.3  PROT 7.0   No results for input(s): LIPASE, AMYLASE in the last 8760 hours. No results for input(s): AMMONIA in the last 8760 hours. CBC:  Recent Labs  07/10/14 0819  WBC 4.8  NEUTROABS 2.5  HGB 11.4*  HCT 36.0*  MCV 88  PLT 276   Lipid Panel:  Recent Labs   07/10/14 0819  CHOL 161  HDL 53  LDLCALC 92  TRIG 78  CHOLHDL 3.0   Lab Results  Component Value Date   HGBA1C 6.1* 07/10/2014   Assessment/Plan 1. Hyperglycemia - discussed dietary changes more extensively with him today -will give him 3 mos to work on his dietary changes before we recheck labs: - Comprehensive metabolic panel; Future - Hemoglobin A1c; Future  2. Hyperlipidemia -cont pravachol 80mg  and dietary changes - Lipid panel; Future  3. Carotid stenosis, symptomatic, with infarction -f/u doppler as planned--I don't see this scheduled--will need to  order it at next visit  4. Chronic diastolic heart failure -EF from echo last year entered -is on labetalol -cont current regimen - Comprehensive metabolic panel; Future  5. Chronic kidney disease, stage III (moderate) -renal function stable, monitor, cont ARB -avoid nsaids and maintain hydration - CBC with Differential/Platelet; Future - Comprehensive metabolic panel; Future  6. Anemia in chronic renal disease - cont to monitor - CBC with Differential/Platelet; Future  Labs/tests ordered:   Orders Placed This Encounter  Procedures  . CBC with Differential/Platelet    Standing Status: Future     Number of Occurrences:      Standing Expiration Date: 05/14/2015  . Comprehensive metabolic panel    Standing Status: Future     Number of Occurrences:      Standing Expiration Date: 05/14/2015    Order Specific Question:  Has the patient fasted?    Answer:  Yes  . Hemoglobin A1c    Standing Status: Future     Number of Occurrences:      Standing Expiration Date: 05/14/2015  . Lipid panel    Standing Status: Future     Number of Occurrences:      Standing Expiration Date: 05/14/2015    Order Specific Question:  Has the patient fasted?    Answer:  Yes    Next appt:  3 mos with labs before  Lilymae Swiech L. Kalub Morillo, D.O. [facility] [address],   [address] Cell Phone (Mon-Fri 8am-5pm):  [phone] On Call:  [phone] & follow prompts after 5pm & weekends Office Phone:  [phone] Office Fax:  [phone]

## 2014-12-27 ENCOUNTER — Other Ambulatory Visit: Payer: Self-pay | Admitting: Internal Medicine

## 2015-02-07 ENCOUNTER — Other Ambulatory Visit: Payer: Commercial Managed Care - HMO

## 2015-02-07 DIAGNOSIS — I5032 Chronic diastolic (congestive) heart failure: Secondary | ICD-10-CM | POA: Diagnosis not present

## 2015-02-07 DIAGNOSIS — N183 Chronic kidney disease, stage 3 unspecified: Secondary | ICD-10-CM

## 2015-02-07 DIAGNOSIS — E785 Hyperlipidemia, unspecified: Secondary | ICD-10-CM | POA: Diagnosis not present

## 2015-02-07 DIAGNOSIS — R739 Hyperglycemia, unspecified: Secondary | ICD-10-CM | POA: Diagnosis not present

## 2015-02-07 DIAGNOSIS — D631 Anemia in chronic kidney disease: Secondary | ICD-10-CM

## 2015-02-07 DIAGNOSIS — N189 Chronic kidney disease, unspecified: Secondary | ICD-10-CM | POA: Diagnosis not present

## 2015-02-08 LAB — COMPREHENSIVE METABOLIC PANEL
ALT: 8 IU/L (ref 0–44)
AST: 15 IU/L (ref 0–40)
Albumin/Globulin Ratio: 1.5 (ref 1.1–2.5)
Albumin: 4 g/dL (ref 3.5–4.8)
Alkaline Phosphatase: 63 IU/L (ref 39–117)
BUN/Creatinine Ratio: 15 (ref 10–22)
BUN: 21 mg/dL (ref 8–27)
Bilirubin Total: 0.2 mg/dL (ref 0.0–1.2)
CO2: 21 mmol/L (ref 18–29)
Calcium: 9.5 mg/dL (ref 8.6–10.2)
Chloride: 103 mmol/L (ref 97–108)
Creatinine, Ser: 1.43 mg/dL — ABNORMAL HIGH (ref 0.76–1.27)
GFR calc Af Amer: 56 mL/min/{1.73_m2} — ABNORMAL LOW (ref >59)
GFR calc non Af Amer: 48 mL/min/{1.73_m2} — ABNORMAL LOW (ref >59)
Globulin, Total: 2.6 g/dL (ref 1.5–4.5)
Glucose: 102 mg/dL — ABNORMAL HIGH (ref 65–99)
Potassium: 4.4 mmol/L (ref 3.5–5.2)
Sodium: 141 mmol/L (ref 134–144)
Total Protein: 6.6 g/dL (ref 6.0–8.5)

## 2015-02-08 LAB — CBC WITH DIFFERENTIAL/PLATELET
Basophils Absolute: 0 10*3/uL (ref 0.0–0.2)
Basos: 0 %
EOS (ABSOLUTE): 0.6 10*3/uL — ABNORMAL HIGH (ref 0.0–0.4)
Eos: 13 %
Hematocrit: 33.2 % — ABNORMAL LOW (ref 37.5–51.0)
Hemoglobin: 10.6 g/dL — ABNORMAL LOW (ref 12.6–17.7)
Immature Grans (Abs): 0 10*3/uL (ref 0.0–0.1)
Immature Granulocytes: 0 %
Lymphocytes Absolute: 1.4 10*3/uL (ref 0.7–3.1)
Lymphs: 27 %
MCH: 28.1 pg (ref 26.6–33.0)
MCHC: 31.9 g/dL (ref 31.5–35.7)
MCV: 88 fL (ref 79–97)
Monocytes Absolute: 0.5 10*3/uL (ref 0.1–0.9)
Monocytes: 10 %
Neutrophils Absolute: 2.6 10*3/uL (ref 1.4–7.0)
Neutrophils: 50 %
Platelets: 244 10*3/uL (ref 150–379)
RBC: 3.77 x10E6/uL — ABNORMAL LOW (ref 4.14–5.80)
RDW: 15.1 % (ref 12.3–15.4)
WBC: 5.1 10*3/uL (ref 3.4–10.8)

## 2015-02-08 LAB — LIPID PANEL
Chol/HDL Ratio: 3.1 ratio units (ref 0.0–5.0)
Cholesterol, Total: 145 mg/dL (ref 100–199)
HDL: 47 mg/dL (ref >39)
LDL Calculated: 83 mg/dL (ref 0–99)
Triglycerides: 73 mg/dL (ref 0–149)
VLDL Cholesterol Cal: 15 mg/dL (ref 5–40)

## 2015-02-08 LAB — PLEASE NOTE

## 2015-02-08 LAB — HEMOGLOBIN A1C
Est. average glucose Bld gHb Est-mCnc: 134 mg/dL
Hgb A1c MFr Bld: 6.3 % — ABNORMAL HIGH (ref 4.8–5.6)

## 2015-02-11 ENCOUNTER — Ambulatory Visit (INDEPENDENT_AMBULATORY_CARE_PROVIDER_SITE_OTHER): Payer: Commercial Managed Care - HMO | Admitting: Internal Medicine

## 2015-02-11 ENCOUNTER — Encounter: Payer: Self-pay | Admitting: Internal Medicine

## 2015-02-11 VITALS — BP 136/52 | HR 52 | Temp 97.4°F | Resp 20 | Ht 69.0 in | Wt 174.2 lb

## 2015-02-11 DIAGNOSIS — N189 Chronic kidney disease, unspecified: Secondary | ICD-10-CM

## 2015-02-11 DIAGNOSIS — I639 Cerebral infarction, unspecified: Secondary | ICD-10-CM

## 2015-02-11 DIAGNOSIS — I5032 Chronic diastolic (congestive) heart failure: Secondary | ICD-10-CM

## 2015-02-11 DIAGNOSIS — N401 Enlarged prostate with lower urinary tract symptoms: Secondary | ICD-10-CM

## 2015-02-11 DIAGNOSIS — I10 Essential (primary) hypertension: Secondary | ICD-10-CM

## 2015-02-11 DIAGNOSIS — N183 Chronic kidney disease, stage 3 unspecified: Secondary | ICD-10-CM

## 2015-02-11 DIAGNOSIS — D631 Anemia in chronic kidney disease: Secondary | ICD-10-CM

## 2015-02-11 DIAGNOSIS — E785 Hyperlipidemia, unspecified: Secondary | ICD-10-CM | POA: Diagnosis not present

## 2015-02-11 DIAGNOSIS — I63239 Cerebral infarction due to unspecified occlusion or stenosis of unspecified carotid arteries: Secondary | ICD-10-CM

## 2015-02-11 DIAGNOSIS — R739 Hyperglycemia, unspecified: Secondary | ICD-10-CM

## 2015-02-11 DIAGNOSIS — N138 Other obstructive and reflux uropathy: Secondary | ICD-10-CM

## 2015-02-11 NOTE — Progress Notes (Signed)
Patient ID: [name], male   DOB: May 12, 1941, 74 y.o.   MRN: [id number]   Location:  [facility]  Goals of Care: Advanced Directive information Does patient have an advance directive?: No, Would patient like information on creating an advanced directive?: Yes - Educational materials given (at a previous visit, but has not completed just yet.)   Chief Complaint  Patient presents with  . Medical Management of Chronic Issues    3 month follow-up, talk about referral, labs printed    HPI: Patient is a 74 y.o. black male seen in the office today for med mgt of chronic diseases, to review his labs and for referrals for his already established cardiologist and urologist.    Reviewed his labs with him.    CKD:  Kidney function newly worse.  He says he drinks a lot of water--drinks 2 cups with breakfast, two bottles in a day.  Discussed drinking more than that each day in the heat.  Has working [facility].    Prediabetes:  hba1c is trending up from 6.1 to 6.3.  Discussed dietary changes and he admits sweets are the culprit for him.    Needs to f/u with cardiology, Dr. Ala Dach appt 04/29/15 re: carotid stenosis and chronic diastolic chf.  Denies chest pains, shortness of breath, ankles aren't swelling.       Sees ophtho in Aug 28, 2015, Dr. Zigmund Daniel.  Vision is ok.  Is going to get his driver's test--going to see if he can pass--says he might need glasses now.    Sees urologist, Dr. Junious Silk.  Getting up twice a night to urinate.  Water goes through him in the daytime.  No hematuria.  No difficulty starting stream. When he has to go, he must go.  No urge incontinence however. Appt is coming up with urology 02/18/15.  He has no complaints himself.    HTN is well controlled today with cozaar, labetalol, cardura (prostate) and amlodipine.    Hyperlipidemia well controlled with his pravachol.  Advised to watch sweets.  Says he does some walking.  Is  limited in his walking with his legs and back.  Has been on disability since 1995 for his back.  Then had stroke and that has bothered the back of his legs which get tired "real quick".  Review of Systems:  Review of Systems  Constitutional: Negative for fever, chills and weight loss.  HENT: Negative for congestion and hearing loss.   Eyes: Positive for blurred vision.  Respiratory: Negative for cough and shortness of breath.   Cardiovascular: Positive for claudication. Negative for chest pain, palpitations, orthopnea, leg swelling and PND.  Gastrointestinal: Negative for abdominal pain, constipation, blood in stool and melena.  Genitourinary: Positive for urgency and frequency. Negative for dysuria, hematuria and flank pain.  Musculoskeletal: Positive for back pain. Negative for falls.  Skin: Negative for itching and rash.  Neurological: Negative for dizziness, loss of consciousness and weakness.       Chronic unsteady gait post-stroke    Past Medical History  Diagnosis Date  . Hypertension   . Hypertensive kidney disease, benign   . Cataract     traumatic  . Gingivitis   . 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 Microscopic hematuria   . 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 Elevated prostate specific antigen (PSA)   . Unspecified vitamin  D deficiency   . Depressive disorder, not elsewhere classified   . Nondependent cannabis abuse   . Hyperlipidemia   . Anemia   . Pure hypercholesterolemia   . Unspecified essential hypertension   . Unspecified late effects of cerebrovascular disease   . Chronic diastolic heart failure   . Chronic kidney disease, stage III (moderate)   . Peripheral vascular disease, unspecified   . 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 Elevated prostate specific antigen (PSA)   . Unspecified  vitamin D deficiency   . Depressive disorder, not elsewhere classified   . Unspecified late effects of cerebrovascular disease     Past Surgical History  Procedure Laterality Date  . Coronary stent placement      2008  . Cataract extraction    . Eye surgery      left eye,    cornea repair    Allergies  Allergen Reactions  . Penicillins    Medications: Patient's Medications  New Prescriptions   No medications on file  Previous Medications   AMLODIPINE (NORVASC) 10 MG TABLET    Take one tablet once daily for blood pressure   ASPIRIN 81 MG TABLET    Take 81 mg by mouth daily.     CALCIUM CARB-CHOLECALCIFEROL (CALCIUM 600 + D PO)    Take by mouth 2 (two) times daily.   CLOPIDOGREL (PLAVIX) 75 MG TABLET    Take one tablet by mouth once daily to help with circulation   DOXAZOSIN (CARDURA) 4 MG TABLET    Take one tablet by mouth once daily for blood pressure   FUROSEMIDE (LASIX) 20 MG TABLET    TAKE ONE TABLET EVERY MORNING AND TAKE ONE TABLET EVERY EVENING   LABETALOL (NORMODYNE) 300 MG TABLET    Take 1 tablet (300 mg total) by mouth 2 (two) times daily.   LOSARTAN (COZAAR) 50 MG TABLET    Take one tablet once daily for blood pressure   PRAVASTATIN (PRAVACHOL) 80 MG TABLET    Take one tablet once daily to control cholesterol  Modified Medications   No medications on file  Discontinued Medications   No medications on file    Physical Exam: Filed Vitals:   02/11/15 0817  BP: 136/52  Pulse: 52  Temp: 97.4 F (36.3 C)  TempSrc: Oral  Resp: 20  Height: 5\' 9"  (1.753 m)  Weight: 174 lb 3.2 oz (79.017 kg)  SpO2: 97%   Physical Exam  Constitutional: He is oriented to person, place, and time. He appears well-developed and well-nourished. No distress.  HENT:  Head: Normocephalic and atraumatic.  Neck: Neck supple. No JVD present.  Cardiovascular: Normal rate, regular rhythm and intact distal pulses.   Murmur heard. 3/6 systolic murmur loudest in second right intercostal space  but radiating throughout precordium  Pulmonary/Chest: Effort normal and breath sounds normal. He has no rales.  Abdominal: Soft. Bowel sounds are normal. He exhibits no distension. There is no tenderness.  Musculoskeletal: Normal range of motion.  Drags right foot a bit when walking, but does not use assistive device  Neurological: He is alert and oriented to person, place, and time.  Skin: Skin is warm and dry.  Psychiatric: He has a normal mood and affect.    Labs reviewed: Basic Metabolic Panel:  Recent Labs  07/10/14 0819 02/07/15 0838  NA 141 141  K 4.1 4.4  CL 104 103  CO2 25 21  GLUCOSE 101* 102*  BUN 15 21  CREATININE 1.10 1.43*  CALCIUM 9.1 9.5   Liver Function Tests:  Recent Labs  07/10/14 0819 02/07/15 0838  AST 25 15  ALT 15 8  ALKPHOS 70 63  BILITOT 0.3 0.2  PROT 7.0 6.6   No results for input(s): LIPASE, AMYLASE in the last 8760 hours. No results for input(s): AMMONIA in the last 8760 hours. CBC:  Recent Labs  07/10/14 0819 02/07/15 0838  WBC 4.8 5.1  NEUTROABS 2.5 2.6  HGB 11.4*  --   HCT 36.0* 33.2*  MCV 88  --   PLT 276  --    Lipid Panel:  Recent Labs  07/10/14 0819 02/07/15 0838  CHOL 161 145  HDL 53 47  LDLCALC 92 83  TRIG 78 73  CHOLHDL 3.0 3.1   Lab Results  Component Value Date   HGBA1C 6.3* 02/07/2015    Procedures reviewed: Echo 06/12/14:  Normal EF, moderate AR, trace AS, grade 2 diastolic dysfunction  Assessment/Plan 1. Hyperglycemia -counseled on dietary changes today -encouraged that he cut back on sweets -does some walking but is limited by claudication and pain from his back - Hemoglobin A1c; Future  2. Hyperlipidemia -is well controlled with pravachol -also follows with Dr. Einar Gip and renewed referral was written  3. Carotid stenosis, symptomatic, with infarction - f/u with Dr. Einar Gip on this, gets screening ultrasounds due to prior stroke related to this - Ambulatory referral to Cardiology  4.  Chronic diastolic heart failure - has been stable, has AR with some AS, but has no s/s of overt chf and bp is controlled now - Ambulatory referral to Cardiology - Basic metabolic panel; Future  5. Chronic kidney disease, stage III (moderate) -had improved, but has recently worsened -encouraged him to better hydrate with an additional bottle of water per day -f/u lab in 3 mos - Basic metabolic panel; Future  6. Anemia in chronic renal disease -stable, cont to monitor  7. Essential hypertension - bp is <140/90 today and he's on an aggressive bp regimen as above -again, f/u with cardiology - Ambulatory referral to Cardiology - Basic metabolic panel; Future  8. BPH with obstruction/lower urinary tract symptoms - stable, has not worsened, but has his upcoming appt with Dr. Junious Silk and needs new referral - Ambulatory referral to Urology - Basic metabolic panel; Future  Labs/tests ordered:   Orders Placed This Encounter  Procedures  . Basic metabolic panel    Standing Status: Future     Number of Occurrences:      Standing Expiration Date: 08/14/2015  . Hemoglobin A1c    Standing Status: Future     Number of Occurrences:      Standing Expiration Date: 08/14/2015  . Ambulatory referral to Urology    Referral Priority:  Routine    Referral Type:  Consultation    Referral Reason:  Specialty Services Required    Requested Specialty:  Urology    Number of Visits Requested:  1  . Ambulatory referral to Cardiology    Referral Priority:  Routine    Referral Type:  Consultation    Referral Reason:  Specialty Services Required    Requested Specialty:  Cardiology    Number of Visits Requested:  1    Next appt:  3 mos with labs before  [place] Adiel Erney, D.O. [facility] [address], [place] [address] Cell Phone (Mon-Fri 8am-5pm):  [phone] On Call:  [phone] & follow prompts  after 5pm & weekends Office Phone:   [phone] Office Fax:  [phone]

## 2015-02-18 DIAGNOSIS — N400 Enlarged prostate without lower urinary tract symptoms: Secondary | ICD-10-CM | POA: Diagnosis not present

## 2015-02-18 DIAGNOSIS — R972 Elevated prostate specific antigen [PSA]: Secondary | ICD-10-CM | POA: Diagnosis not present

## 2015-02-20 ENCOUNTER — Other Ambulatory Visit: Payer: Self-pay | Admitting: Urology

## 2015-02-20 DIAGNOSIS — R972 Elevated prostate specific antigen [PSA]: Secondary | ICD-10-CM

## 2015-03-18 ENCOUNTER — Ambulatory Visit (HOSPITAL_COMMUNITY)
Admission: RE | Admit: 2015-03-18 | Discharge: 2015-03-18 | Disposition: A | Discharge status: Home / Self Care | Payer: Commercial Managed Care - HMO | Source: Ambulatory Visit | Attending: Urology | Admitting: Urology

## 2015-03-18 DIAGNOSIS — R590 Localized enlarged lymph nodes: Secondary | ICD-10-CM | POA: Insufficient documentation

## 2015-03-18 DIAGNOSIS — R972 Elevated prostate specific antigen [PSA]: Secondary | ICD-10-CM | POA: Diagnosis not present

## 2015-03-18 DIAGNOSIS — N4289 Other specified disorders of prostate: Secondary | ICD-10-CM | POA: Diagnosis not present

## 2015-03-18 MED ORDER — GADOBENATE DIMEGLUMINE 529 MG/ML IV SOLN
20.0000 mL | Freq: Once | INTRAVENOUS | Status: AC | PRN
  Administered 2015-03-18: 16 mL via INTRAVENOUS

## 2015-05-07 ENCOUNTER — Other Ambulatory Visit: Payer: Commercial Managed Care - HMO

## 2015-05-13 ENCOUNTER — Ambulatory Visit: Payer: Commercial Managed Care - HMO | Admitting: Internal Medicine

## 2015-05-13 ENCOUNTER — Encounter: Payer: Self-pay | Admitting: Internal Medicine

## 2015-05-29 ENCOUNTER — Other Ambulatory Visit: Payer: Self-pay | Admitting: Internal Medicine

## 2015-06-14 ENCOUNTER — Ambulatory Visit (INDEPENDENT_AMBULATORY_CARE_PROVIDER_SITE_OTHER): Payer: Commercial Managed Care - HMO | Admitting: Internal Medicine

## 2015-06-14 ENCOUNTER — Encounter: Payer: Self-pay | Admitting: Internal Medicine

## 2015-06-14 VITALS — BP 150/66 | HR 60 | Temp 97.7°F | Wt 166.0 lb

## 2015-06-14 DIAGNOSIS — N401 Enlarged prostate with lower urinary tract symptoms: Secondary | ICD-10-CM | POA: Diagnosis not present

## 2015-06-14 DIAGNOSIS — D638 Anemia in other chronic diseases classified elsewhere: Secondary | ICD-10-CM

## 2015-06-14 DIAGNOSIS — R739 Hyperglycemia, unspecified: Secondary | ICD-10-CM | POA: Diagnosis not present

## 2015-06-14 DIAGNOSIS — I5032 Chronic diastolic (congestive) heart failure: Secondary | ICD-10-CM | POA: Diagnosis not present

## 2015-06-14 DIAGNOSIS — N138 Other obstructive and reflux uropathy: Secondary | ICD-10-CM

## 2015-06-14 DIAGNOSIS — Z23 Encounter for immunization: Secondary | ICD-10-CM

## 2015-06-14 DIAGNOSIS — N183 Chronic kidney disease, stage 3 unspecified: Secondary | ICD-10-CM

## 2015-06-14 DIAGNOSIS — D649 Anemia, unspecified: Secondary | ICD-10-CM | POA: Diagnosis not present

## 2015-06-14 DIAGNOSIS — E785 Hyperlipidemia, unspecified: Secondary | ICD-10-CM

## 2015-06-14 DIAGNOSIS — N189 Chronic kidney disease, unspecified: Secondary | ICD-10-CM | POA: Diagnosis not present

## 2015-06-14 DIAGNOSIS — I63239 Cerebral infarction due to unspecified occlusion or stenosis of unspecified carotid arteries: Secondary | ICD-10-CM | POA: Diagnosis not present

## 2015-06-14 NOTE — Progress Notes (Signed)
Patient ID: [name], male   DOB: [birth year], 74 y.o.   MRN: [id number]   Location: [place] Provider: Rexene Edison. Mariea Clonts, D.O., C.M.D.  Code Status: full code Goals of Care: Advanced Directive information Does patient have an advance directive?: No, Would patient like information on creating an advanced directive?: Yes - Educational materials given  Chief Complaint  Patient presents with  . Medical Management of Chronic Issues    CHF, blood pressure, CKD, hyperglycemia    HPI: Patient is a 74 y.o. male seen in the office today for med mgt of chronic diseases.  BP up this morning.  Did take all of his pills.  Says he did not smoke (smells like he did).    Doing ok.  No concerns--same old same old.    Has upcoming visit with Dr. Zigmund Daniel in Jan and needs to get vision tested for driver's test.  He has missed his appt with Dr. Einar Gip to f/u on his carotid stenosis and chf.  Advised to reschedule.  Says he lost the card. CMAs to get him the number before he leaves.  Just saw urology:  Has fu in six months--PSA level was elevated but stable and rectal exam was unremarkable.  Notes indicate (reviewed) that he was to return sooner if PSA was trending up and then MRI might be done.    He was eating sweet cookies daily--a whole bag and now he switched to crackers as a snack after he got his lab results that indicated increasing hyperglycemia.  Review of Systems:  Review of Systems  Constitutional: Negative for fever, chills and malaise/fatigue.  HENT: Negative for congestion.   Eyes: Negative for blurred vision.  Respiratory: Positive for cough. Negative for shortness of breath.        Smokes marijuana  Cardiovascular: Negative for chest pain and leg swelling.  Gastrointestinal: Negative for abdominal pain, constipation, blood in stool and melena.  Genitourinary: Positive for urgency and frequency. Negative for dysuria.  Musculoskeletal: Negative for myalgias and falls.    Skin: Negative for rash.  Neurological: Negative for dizziness and weakness.  Psychiatric/Behavioral: Negative for depression and memory loss.    Past Medical History  Diagnosis Date  . Hypertension   . Hypertensive kidney disease, benign   . Cataract     traumatic  . Gingivitis   . 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 Microscopic hematuria   . 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 Elevated prostate specific antigen (PSA)   . Unspecified vitamin D deficiency   . Depressive disorder, not elsewhere classified   . Nondependent cannabis abuse   . Hyperlipidemia   . Anemia   . Pure hypercholesterolemia   . Unspecified essential hypertension   . Unspecified late effects of cerebrovascular disease   . Chronic diastolic heart failure ([place])   . Chronic kidney disease, stage III (moderate)   . Peripheral vascular disease, unspecified ([place])   . 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 Elevated prostate specific antigen (PSA)   . Unspecified vitamin D deficiency   . Depressive disorder, not elsewhere classified   . Unspecified late effects of cerebrovascular disease     Past Surgical History  Procedure Laterality Date  . Coronary stent placement      2008  . Cataract extraction    . Eye surgery  left eye,    cornea repair    Allergies  Allergen Reactions  . Penicillins       Medication List       This list is accurate as of: 06/14/15 10:52 AM.  Always use your most recent med list.               amLODipine 10 MG tablet  Commonly known as:  NORVASC  TAKE 1 TABLET ONCE DAILY FOR BLOOD PRESSURE     aspirin 81 MG tablet  Take 81 mg by mouth daily.     CALTRATE 600+D 600-800 MG-UNIT Tabs  Generic drug:  Calcium Carb-Cholecalciferol  TAKE 1 TABLET TWICE DAILY     clopidogrel 75 MG tablet   Commonly known as:  PLAVIX  TAKE 1 TABLET ONCE DAILY TO HELP WITH CIRCULATION     doxazosin 4 MG tablet  Commonly known as:  CARDURA  TAKE ONE TABLET BY MOUTH ONCE DAILY FOR BLOOD PRESSURE     furosemide 20 MG tablet  Commonly known as:  LASIX  TAKE ONE TABLET EVERY MORNING AND TAKE ONE TABLET EVERY EVENING     labetalol 300 MG tablet  Commonly known as:  NORMODYNE  TAKE 1 TABLET TWICE DAILY     losartan 50 MG tablet  Commonly known as:  COZAAR  TAKE 1 TABLET ONCE DAILY FOR BLOOD PRESSURE     pravastatin 80 MG tablet  Commonly known as:  PRAVACHOL  TAKE 1 TABLET ONE TIME DAILY  TO  CONTROL  CHOLESTEROL        Health Maintenance  Topic Date Due  . INFLUENZA VACCINE  03/04/2015  . COLONOSCOPY  08/04/2015 (Originally 03/15/1991)  . ZOSTAVAX  08/04/2015 (Originally 03/14/2001)  . TETANUS/TDAP  10/03/2015 (Originally 03/14/1960)  . PNA vac Low Risk Adult  Completed    Physical Exam: Filed Vitals:   06/14/15 1033  BP: 150/66  Pulse: 60  Temp: 97.7 F (36.5 C)  TempSrc: Oral  Weight: 166 lb (75.297 kg)  SpO2: 97%   Body mass index is 24.5 kg/(m^2). Physical Exam  Constitutional: He is oriented to person, place, and time.  Disheveled black male, jacket with odor of marijuana when removed  Cardiovascular: Normal rate, regular rhythm, normal heart sounds and intact distal pulses.   Pulmonary/Chest: Effort normal and breath sounds normal. No respiratory distress.  Abdominal: Soft. Bowel sounds are normal. He exhibits no distension. There is no tenderness.  Musculoskeletal: Normal range of motion.  Neurological: He is alert and oriented to person, place, and time.  Skin: Skin is warm and dry.  Psychiatric: He has a normal mood and affect.    Labs reviewed: Basic Metabolic Panel:  Recent Labs  07/10/14 0819 02/07/15 0838  NA 141 141  K 4.1 4.4  CL 104 103  CO2 25 21  GLUCOSE 101* 102*  BUN 15 21  CREATININE 1.10 1.43*  CALCIUM 9.1 9.5   Liver Function  Tests:  Recent Labs  07/10/14 0819 02/07/15 0838  AST 25 15  ALT 15 8  ALKPHOS 70 63  BILITOT 0.3 0.2  PROT 7.0 6.6  ALBUMIN 4.1 4.0   No results for input(s): LIPASE, AMYLASE in the last 8760 hours. No results for input(s): AMMONIA in the last 8760 hours. CBC:  Recent Labs  07/10/14 0819 02/07/15 0838  WBC 4.8 5.1  NEUTROABS 2.5 2.6  HGB 11.4*  --   HCT 36.0* 33.2*  MCV 88  --   PLT 276  --  Lipid Panel:  Recent Labs  07/10/14 0819 02/07/15 0838  CHOL 161 145  HDL 53 47  LDLCALC 92 83  TRIG 78 73  CHOLHDL 3.0 3.1   Lab Results  Component Value Date   HGBA1C 6.3* 02/07/2015     Assessment/Plan 1. Carotid stenosis, symptomatic, with infarction [facility]) -advised to reschedule appt with Dr Einar Gip to f/u on this -will copy him on my note  2. Chronic kidney disease, stage III (moderate) - has been stable -encourage hydration, avoid nsaids and other nephrotoxic meds - CBC with Differential/Platelet - Basic metabolic panel  3. Chronic diastolic heart failure ([facility]) -stable, no recent symptoms, cont labetalol, losartan, lasix  4. Hyperlipidemia -lipids at goal in July, cont high dose pravachol due to affordability  5. Hyperglycemia -glucose trending upward--counseled on diet and exercise was encouraged - Hemoglobin A1c before next visit  6. BPH with obstruction/lower urinary tract symptoms -cont cardura and f/u urology  7. Need for prophylactic vaccination and inoculation against influenza -flu shot given  Labs/tests ordered:   Orders Placed This Encounter  Procedures  . Flu Vaccine QUAD 36+ mos IM  . Hemoglobin A1c  . CBC with Differential/Platelet  . Basic metabolic panel  . Iron and TIBC  . B12 and Folate Panel  . Ferritin  . Specimen status report   Next appt:  09/23/2015 annual with labs before   [place]. Yasmine Kilbourne, D.O. [facility] [address], [place] [address] Cell Phone (Mon-Fri  8am-5pm):  [phone] On Call:  [phone] & follow prompts after 5pm & weekends Office Phone:  [phone] Office Fax:  [phone]

## 2015-06-15 LAB — BASIC METABOLIC PANEL
BUN/Creatinine Ratio: 16 (ref 10–22)
BUN: 22 mg/dL (ref 8–27)
CO2: 24 mmol/L (ref 18–29)
Calcium: 9.1 mg/dL (ref 8.6–10.2)
Chloride: 103 mmol/L (ref 97–106)
Creatinine, Ser: 1.41 mg/dL — ABNORMAL HIGH (ref 0.76–1.27)
GFR calc Af Amer: 56 mL/min/{1.73_m2} — ABNORMAL LOW (ref >59)
GFR calc non Af Amer: 49 mL/min/{1.73_m2} — ABNORMAL LOW (ref >59)
Glucose: 91 mg/dL (ref 65–99)
Potassium: 5 mmol/L (ref 3.5–5.2)
Sodium: 141 mmol/L (ref 136–144)

## 2015-06-15 LAB — CBC WITH DIFFERENTIAL/PLATELET
Basophils Absolute: 0 10*3/uL (ref 0.0–0.2)
Basos: 0 %
EOS (ABSOLUTE): 0.4 10*3/uL (ref 0.0–0.4)
Eos: 8 %
Hematocrit: 32.2 % — ABNORMAL LOW (ref 37.5–51.0)
Hemoglobin: 10 g/dL — ABNORMAL LOW (ref 12.6–17.7)
Immature Grans (Abs): 0 10*3/uL (ref 0.0–0.1)
Immature Granulocytes: 0 %
Lymphocytes Absolute: 1 10*3/uL (ref 0.7–3.1)
Lymphs: 23 %
MCH: 27.9 pg (ref 26.6–33.0)
MCHC: 31.1 g/dL — ABNORMAL LOW (ref 31.5–35.7)
MCV: 90 fL (ref 79–97)
Monocytes Absolute: 0.6 10*3/uL (ref 0.1–0.9)
Monocytes: 12 %
Neutrophils Absolute: 2.6 10*3/uL (ref 1.4–7.0)
Neutrophils: 57 %
Platelets: 357 10*3/uL (ref 150–379)
RBC: 3.58 x10E6/uL — ABNORMAL LOW (ref 4.14–5.80)
RDW: 15.1 % (ref 12.3–15.4)
WBC: 4.6 10*3/uL (ref 3.4–10.8)

## 2015-06-15 LAB — HEMOGLOBIN A1C
Est. average glucose Bld gHb Est-mCnc: 134 mg/dL
Hgb A1c MFr Bld: 6.3 % — ABNORMAL HIGH (ref 4.8–5.6)

## 2015-06-18 LAB — SPECIMEN STATUS REPORT

## 2015-06-18 LAB — B12 AND FOLATE PANEL
Folate: 3.5 ng/mL (ref >3.0)
Vitamin B-12: 328 pg/mL (ref 211–946)

## 2015-06-18 LAB — IRON AND TIBC
Iron Saturation: 15 % (ref 15–55)
Iron: 43 ug/dL (ref 38–169)
Total Iron Binding Capacity: 280 ug/dL (ref 250–450)
UIBC: 237 ug/dL (ref 111–343)

## 2015-06-18 LAB — FERRITIN: Ferritin: 68 ng/mL (ref 30–400)

## 2015-08-14 ENCOUNTER — Encounter: Payer: Self-pay | Admitting: Internal Medicine

## 2015-08-28 ENCOUNTER — Ambulatory Visit (INDEPENDENT_AMBULATORY_CARE_PROVIDER_SITE_OTHER): Payer: Commercial Managed Care - HMO | Admitting: Ophthalmology

## 2015-09-02 ENCOUNTER — Ambulatory Visit (INDEPENDENT_AMBULATORY_CARE_PROVIDER_SITE_OTHER): Payer: Commercial Managed Care - HMO | Admitting: Internal Medicine

## 2015-09-02 ENCOUNTER — Encounter: Payer: Self-pay | Admitting: Internal Medicine

## 2015-09-02 VITALS — BP 132/62 | HR 58 | Temp 97.8°F | Ht 69.0 in | Wt 166.0 lb

## 2015-09-02 DIAGNOSIS — I10 Essential (primary) hypertension: Secondary | ICD-10-CM

## 2015-09-02 DIAGNOSIS — D508 Other iron deficiency anemias: Secondary | ICD-10-CM

## 2015-09-02 DIAGNOSIS — R739 Hyperglycemia, unspecified: Secondary | ICD-10-CM

## 2015-09-02 DIAGNOSIS — H33302 Unspecified retinal break, left eye: Secondary | ICD-10-CM

## 2015-09-02 DIAGNOSIS — E785 Hyperlipidemia, unspecified: Secondary | ICD-10-CM | POA: Diagnosis not present

## 2015-09-02 DIAGNOSIS — N401 Enlarged prostate with lower urinary tract symptoms: Secondary | ICD-10-CM

## 2015-09-02 DIAGNOSIS — H33322 Round hole, left eye: Secondary | ICD-10-CM

## 2015-09-02 DIAGNOSIS — N138 Other obstructive and reflux uropathy: Secondary | ICD-10-CM

## 2015-09-02 NOTE — Progress Notes (Signed)
Patient ID: [name], male   DOB: [birth year], 75 y.o.   MRN: [id number]   Location: [place] Provider: Rexene Edison. Mariea Clonts, D.O., C.M.D.  Goals of Care: Advanced Directive information Does patient have an advance directive?: No  Chief Complaint  Patient presents with  . Referral    to Dr. Zigmund Daniel for yearly eye exam. Seen him before, had hole in retina (left)    HPI: Patient is a 75 y.o. male seen in the office today for visit due to need for referral to Dr. Zigmund Daniel for his annual eye exam.  Reports he historically had a hole in his left retina.  No new problems.  Feels like his vision is about the same.  Had surgery 4-5 years ago.  Right eye fine.  Had labs scheduled earlier this month.  Wants to go Wednesday to our lab for his bloodwork.    BP at goal finally with his cozaar, labetalol, lasix, cardura, norvasc.  Eats pineapple in fruit cocktail.    Had DRE and hemoccult through Dr. Junious Silk, he reports in the summertime.  This was said to be normal.  He follows up in a year.  We discussed cologuard, but he refuses at this time.  Review of Systems:  Review of Systems  Constitutional: Negative for fever and chills.  HENT: Negative for congestion.   Eyes: Positive for blurred vision.       Left eye  Respiratory: Negative for shortness of breath.   Cardiovascular: Negative for chest pain and palpitations.  Gastrointestinal: Negative for abdominal pain.  Genitourinary: Negative for dysuria.  Musculoskeletal: Negative for falls.  Skin: Negative for rash.  Neurological: Negative for dizziness.  Psychiatric/Behavioral: Negative for depression and memory loss.    Past Medical History  Diagnosis Date  . Hypertension   . Hypertensive kidney disease, benign   . Cataract     traumatic  . Gingivitis   . 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 Microscopic hematuria   . Hypertrophy of prostate with urinary obstruction and  other lower urinary tract symptoms (LUTS)   . Elevated prostate specific antigen (PSA)   . Unspecified vitamin D deficiency   . Depressive disorder, not elsewhere classified   . Nondependent cannabis abuse   . Hyperlipidemia   . Anemia   . Pure hypercholesterolemia   . Unspecified essential hypertension   . Unspecified late effects of cerebrovascular disease   . Chronic diastolic heart failure ([place])   . Chronic kidney disease, stage III (moderate)   . Peripheral vascular disease, unspecified ([place])   . 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 Elevated prostate specific antigen (PSA)   . Unspecified vitamin D deficiency   . Depressive disorder, not elsewhere classified   . Unspecified late effects of cerebrovascular disease     Past Surgical History  Procedure Laterality Date  . Coronary stent placement      2008  . Cataract extraction    . Eye surgery      left eye,    cornea repair    Allergies  Allergen Reactions  . Penicillins       Medication List       This list is accurate as of: 09/02/15 11:31 AM.  Always use your most recent med list.               amLODipine 10 MG tablet  Commonly known as:  NORVASC  TAKE 1 TABLET ONCE DAILY FOR BLOOD PRESSURE     aspirin 81 MG tablet  Take 81 mg by mouth daily.     CALTRATE 600+D 600-800 MG-UNIT Tabs  Generic drug:  Calcium Carb-Cholecalciferol  TAKE 1 TABLET TWICE DAILY     clopidogrel 75 MG tablet  Commonly known as:  PLAVIX  TAKE 1 TABLET ONCE DAILY TO HELP WITH CIRCULATION     doxazosin 4 MG tablet  Commonly known as:  CARDURA  TAKE ONE TABLET BY MOUTH ONCE DAILY FOR BLOOD PRESSURE     furosemide 20 MG tablet  Commonly known as:  LASIX  TAKE ONE TABLET EVERY MORNING AND TAKE ONE TABLET EVERY EVENING     labetalol 300 MG tablet  Commonly known as:  NORMODYNE  TAKE 1 TABLET TWICE DAILY     losartan 50 MG tablet  Commonly known as:  COZAAR  TAKE  1 TABLET ONCE DAILY FOR BLOOD PRESSURE     pravastatin 80 MG tablet  Commonly known as:  PRAVACHOL  TAKE 1 TABLET ONE TIME DAILY  TO  CONTROL  CHOLESTEROL        Health Maintenance  Topic Date Due  . COLONOSCOPY  03/15/1991  . ZOSTAVAX  03/14/2001  . TETANUS/TDAP  10/03/2015 (Originally 03/14/1960)  . INFLUENZA VACCINE  03/03/2016  . PNA vac Low Risk Adult  Completed    Physical Exam: Filed Vitals:   09/02/15 1110  BP: 132/62  Pulse: 58  Temp: 97.8 F (36.6 C)  TempSrc: Oral  Height: 5\' 9"  (1.753 m)  Weight: 166 lb (75.297 kg)  SpO2: 98%   Body mass index is 24.5 kg/(m^2). Physical Exam  Constitutional: He is oriented to person, place, and time. He appears well-developed and well-nourished. No distress.  Cardiovascular: Normal rate and regular rhythm.   Murmur heard. Systolic in aortic area  Pulmonary/Chest: Effort normal and breath sounds normal. No respiratory distress.  Abdominal: Soft. Bowel sounds are normal. He exhibits no distension and no mass. There is no tenderness.  Musculoskeletal: Normal range of motion.  Neurological: He is alert and oriented to person, place, and time.  Skin: Skin is warm and dry.  Psychiatric: He has a normal mood and affect.    Labs reviewed: Basic Metabolic Panel:  Recent Labs  02/07/15 0838 06/14/15 1123  NA 141 141  K 4.4 5.0  CL 103 103  CO2 21 24  GLUCOSE 102* 91  BUN 21 22  CREATININE 1.43* 1.41*  CALCIUM 9.5 9.1   Liver Function Tests:  Recent Labs  02/07/15 0838  AST 15  ALT 8  ALKPHOS 63  BILITOT 0.2  PROT 6.6  ALBUMIN 4.0   No results for input(s): LIPASE, AMYLASE in the last 8760 hours. No results for input(s): AMMONIA in the last 8760 hours. CBC:  Recent Labs  02/07/15 0838 06/14/15 1123  WBC 5.1 4.6  NEUTROABS 2.6 2.6  HCT 33.2* 32.2*  MCV 88 90  PLT 244 357   Lipid Panel:  Recent Labs  02/07/15 0838  CHOL 145  HDL 47  LDLCALC 83  TRIG 73  CHOLHDL 3.1   Lab Results    Component Value Date   HGBA1C 6.3* 06/14/2015   Assessment/Plan 1. Retina hole, left - Ambulatory referral to Ophthalmology--for routine annual examination due to prior retinal surgery  2. Essential hypertension -bp is finally under control with aggressive regimen  3. Other iron deficiency anemias -cont to monitor -needs f/u labs scheduled next month  4. Hyperglycemia -cont to work on diet and exercise -can't make changes today when labs are not scheduled until february  5. Hyperlipidemia -cont to work on diet and exercise  -can't make changes today when labs are not scheduled until feb  6. BPH with obstruction/lower urinary tract symptoms -stable -keep annual f/u with Dr. Junious Silk -he refuses cologuard due to having had hemoccult in the summer that was negative--explained that it's not as good of a test as cologuard   Labs/tests ordered:   Orders Placed This Encounter  Procedures  . Ambulatory referral to Ophthalmology    Referral Priority:  Routine    Referral Type:  Consultation    Referral Reason:  Specialty Services Required    Requested Specialty:  Ophthalmology    Number of Visits Requested:  1   Next appt:  4 mos for med mgt so labs from feb can be reviewed  Keidrick Murty L. Marvella Jenning, D.O. [facility] [address], [place] [address] Cell Phone (Mon-Fri 8am-5pm):  [phone] On Call:  [phone] & follow prompts after 5pm & weekends Office Phone:  [phone] Office Fax:  [phone]

## 2015-09-02 NOTE — Patient Instructions (Signed)
Keep lab appt. [place] office visit. Schedule a new appt in 4 mos.

## 2015-09-16 ENCOUNTER — Ambulatory Visit (INDEPENDENT_AMBULATORY_CARE_PROVIDER_SITE_OTHER): Payer: Commercial Managed Care - HMO | Admitting: Ophthalmology

## 2015-09-16 DIAGNOSIS — H35342 Macular cyst, hole, or pseudohole, left eye: Secondary | ICD-10-CM

## 2015-09-16 DIAGNOSIS — H43813 Vitreous degeneration, bilateral: Secondary | ICD-10-CM

## 2015-09-16 DIAGNOSIS — H35033 Hypertensive retinopathy, bilateral: Secondary | ICD-10-CM | POA: Diagnosis not present

## 2015-09-16 DIAGNOSIS — H2511 Age-related nuclear cataract, right eye: Secondary | ICD-10-CM

## 2015-09-16 DIAGNOSIS — I10 Essential (primary) hypertension: Secondary | ICD-10-CM | POA: Diagnosis not present

## 2015-09-19 ENCOUNTER — Other Ambulatory Visit: Payer: Self-pay

## 2015-09-20 ENCOUNTER — Telehealth: Payer: Self-pay | Admitting: *Deleted

## 2015-09-20 ENCOUNTER — Other Ambulatory Visit: Payer: Commercial Managed Care - HMO

## 2015-09-20 ENCOUNTER — Other Ambulatory Visit: Payer: Self-pay | Admitting: Internal Medicine

## 2015-09-20 DIAGNOSIS — E785 Hyperlipidemia, unspecified: Secondary | ICD-10-CM | POA: Diagnosis not present

## 2015-09-20 DIAGNOSIS — I159 Secondary hypertension, unspecified: Secondary | ICD-10-CM

## 2015-09-20 DIAGNOSIS — N189 Chronic kidney disease, unspecified: Secondary | ICD-10-CM

## 2015-09-20 DIAGNOSIS — R739 Hyperglycemia, unspecified: Secondary | ICD-10-CM

## 2015-09-20 DIAGNOSIS — D649 Anemia, unspecified: Secondary | ICD-10-CM

## 2015-09-20 DIAGNOSIS — Z299 Encounter for prophylactic measures, unspecified: Secondary | ICD-10-CM

## 2015-09-20 DIAGNOSIS — H33309 Unspecified retinal break, unspecified eye: Secondary | ICD-10-CM

## 2015-09-20 NOTE — Telephone Encounter (Signed)
Patient walked into office requesting referral to [facility] [phone]. Patient has an appointment for 10/01/2015@2 :00, but needs a referral from PCP due to [facility]. Referral placed.

## 2015-09-21 LAB — BASIC METABOLIC PANEL
BUN/Creatinine Ratio: 14 (ref 10–22)
BUN: 16 mg/dL (ref 8–27)
CO2: 21 mmol/L (ref 18–29)
Calcium: 9.1 mg/dL (ref 8.6–10.2)
Chloride: 105 mmol/L (ref 96–106)
Creatinine, Ser: 1.12 mg/dL (ref 0.76–1.27)
GFR calc Af Amer: 74 mL/min/{1.73_m2} (ref >59)
GFR calc non Af Amer: 64 mL/min/{1.73_m2} (ref >59)
Glucose: 98 mg/dL (ref 65–99)
Potassium: 4.5 mmol/L (ref 3.5–5.2)
Sodium: 141 mmol/L (ref 134–144)

## 2015-09-21 LAB — CBC WITH DIFFERENTIAL/PLATELET
Basophils Absolute: 0 10*3/uL (ref 0.0–0.2)
Basos: 0 %
EOS (ABSOLUTE): 0.7 10*3/uL — ABNORMAL HIGH (ref 0.0–0.4)
Eos: 17 %
Hematocrit: 32.1 % — ABNORMAL LOW (ref 37.5–51.0)
Hemoglobin: 10.3 g/dL — ABNORMAL LOW (ref 12.6–17.7)
Immature Grans (Abs): 0 10*3/uL (ref 0.0–0.1)
Immature Granulocytes: 0 %
Lymphocytes Absolute: 0.9 10*3/uL (ref 0.7–3.1)
Lymphs: 21 %
MCH: 27.8 pg (ref 26.6–33.0)
MCHC: 32.1 g/dL (ref 31.5–35.7)
MCV: 87 fL (ref 79–97)
Monocytes Absolute: 0.5 10*3/uL (ref 0.1–0.9)
Monocytes: 11 %
Neutrophils Absolute: 2.3 10*3/uL (ref 1.4–7.0)
Neutrophils: 51 %
Platelets: 262 10*3/uL (ref 150–379)
RBC: 3.71 x10E6/uL — ABNORMAL LOW (ref 4.14–5.80)
RDW: 15.3 % (ref 12.3–15.4)
WBC: 4.5 10*3/uL (ref 3.4–10.8)

## 2015-09-21 LAB — LIPID PANEL
Chol/HDL Ratio: 3 ratio units (ref 0.0–5.0)
Cholesterol, Total: 143 mg/dL (ref 100–199)
HDL: 48 mg/dL (ref >39)
LDL Calculated: 78 mg/dL (ref 0–99)
Triglycerides: 83 mg/dL (ref 0–149)
VLDL Cholesterol Cal: 17 mg/dL (ref 5–40)

## 2015-09-21 LAB — HEMOGLOBIN A1C
Est. average glucose Bld gHb Est-mCnc: 123 mg/dL
Hgb A1c MFr Bld: 5.9 % — ABNORMAL HIGH (ref 4.8–5.6)

## 2015-09-23 ENCOUNTER — Encounter: Payer: Self-pay | Admitting: Internal Medicine

## 2015-10-01 DIAGNOSIS — H401123 Primary open-angle glaucoma, left eye, severe stage: Secondary | ICD-10-CM | POA: Diagnosis not present

## 2015-10-01 DIAGNOSIS — Z961 Presence of intraocular lens: Secondary | ICD-10-CM | POA: Diagnosis not present

## 2015-10-01 DIAGNOSIS — H401111 Primary open-angle glaucoma, right eye, mild stage: Secondary | ICD-10-CM | POA: Diagnosis not present

## 2015-10-01 DIAGNOSIS — H2511 Age-related nuclear cataract, right eye: Secondary | ICD-10-CM | POA: Diagnosis not present

## 2015-10-01 DIAGNOSIS — H35342 Macular cyst, hole, or pseudohole, left eye: Secondary | ICD-10-CM | POA: Diagnosis not present

## 2015-10-23 DIAGNOSIS — Z961 Presence of intraocular lens: Secondary | ICD-10-CM | POA: Diagnosis not present

## 2015-10-23 DIAGNOSIS — H401123 Primary open-angle glaucoma, left eye, severe stage: Secondary | ICD-10-CM | POA: Diagnosis not present

## 2015-10-23 DIAGNOSIS — H2511 Age-related nuclear cataract, right eye: Secondary | ICD-10-CM | POA: Diagnosis not present

## 2015-10-23 DIAGNOSIS — H401111 Primary open-angle glaucoma, right eye, mild stage: Secondary | ICD-10-CM | POA: Diagnosis not present

## 2015-12-27 ENCOUNTER — Encounter: Payer: Self-pay | Admitting: Internal Medicine

## 2015-12-27 ENCOUNTER — Ambulatory Visit (INDEPENDENT_AMBULATORY_CARE_PROVIDER_SITE_OTHER): Payer: Commercial Managed Care - HMO | Admitting: Internal Medicine

## 2015-12-27 VITALS — BP 140/60 | HR 58 | Temp 97.6°F | Ht 69.0 in | Wt 165.0 lb

## 2015-12-27 DIAGNOSIS — Z1211 Encounter for screening for malignant neoplasm of colon: Secondary | ICD-10-CM

## 2015-12-27 DIAGNOSIS — H409 Unspecified glaucoma: Secondary | ICD-10-CM

## 2015-12-27 DIAGNOSIS — C61 Malignant neoplasm of prostate: Secondary | ICD-10-CM

## 2015-12-27 DIAGNOSIS — R739 Hyperglycemia, unspecified: Secondary | ICD-10-CM | POA: Diagnosis not present

## 2015-12-27 DIAGNOSIS — I5032 Chronic diastolic (congestive) heart failure: Secondary | ICD-10-CM

## 2015-12-27 DIAGNOSIS — N183 Chronic kidney disease, stage 3 unspecified: Secondary | ICD-10-CM

## 2015-12-27 DIAGNOSIS — D638 Anemia in other chronic diseases classified elsewhere: Secondary | ICD-10-CM | POA: Diagnosis not present

## 2015-12-27 DIAGNOSIS — I10 Essential (primary) hypertension: Secondary | ICD-10-CM | POA: Diagnosis not present

## 2015-12-27 DIAGNOSIS — E785 Hyperlipidemia, unspecified: Secondary | ICD-10-CM

## 2015-12-27 NOTE — Progress Notes (Signed)
Location:  [facility] clinic Provider:  Amman Bartel L. Mariea Clonts, D.O., C.M.D.  Code Status: full code Goals of Care:  Advanced Directives 12/27/2015  Does patient have an advance directive? No  Would patient like information on creating an advanced directive? -  has previously been given Scientist, clinical (histocompatibility and immunogenetics), but has not completed advance directive  Chief Complaint  Patient presents with  . Medical Management of Chronic Issues    69mth follow-up    HPI: Patient is a 75 y.o. male seen today for medical management of chronic diseases.    Dr. Junious Silk is following him for his prostate cancer.  Had a MRI done and has not seen him since.  Says he was called with a report and there was going to be a f/u in 6 mos, but he has not heard.   following with Dr. Zigmund Daniel about retinal hole.  Referred him to another doctor.  Has glaucoma also and now on drops for that--latanoprost.    BP at upper limits of normal.  Did take his meds this am.  No headache, chest pain or breathing trouble.     Past Medical History  Diagnosis Date  . Hypertension   . Hypertensive kidney disease, benign   . Cataract     traumatic  . Gingivitis   . 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 Microscopic hematuria   . 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 Elevated prostate specific antigen (PSA)   . Unspecified vitamin D deficiency   . Depressive disorder, not elsewhere classified   . Nondependent cannabis abuse   . Hyperlipidemia   . Anemia   . Pure hypercholesterolemia   . Unspecified essential hypertension   . Unspecified late effects of cerebrovascular disease   . Chronic diastolic heart failure ([place])   . Chronic kidney disease, stage III (moderate)   . Peripheral vascular disease, unspecified ([place])   . 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 Elevated  prostate specific antigen (PSA)   . Unspecified vitamin D deficiency   . Depressive disorder, not elsewhere classified   . Unspecified late effects of cerebrovascular disease     Past Surgical History  Procedure Laterality Date  . Coronary stent placement      2008  . Cataract extraction    . Eye surgery      left eye,    cornea repair    Allergies  Allergen Reactions  . Penicillins       Medication List       This list is accurate as of: 12/27/15  9:30 AM.  Always use your most recent med list.               amLODipine 10 MG tablet  Commonly known as:  NORVASC  TAKE 1 TABLET ONCE DAILY FOR BLOOD PRESSURE     aspirin 81 MG tablet  Take 81 mg by mouth daily.     CALTRATE 600+D 600-800 MG-UNIT Tabs  Generic drug:  Calcium Carb-Cholecalciferol  TAKE 1 TABLET TWICE DAILY     clopidogrel 75 MG tablet  Commonly known as:  PLAVIX  TAKE 1 TABLET ONCE DAILY TO HELP WITH CIRCULATION     doxazosin 4 MG tablet  Commonly known as:  CARDURA  TAKE ONE TABLET BY MOUTH ONCE DAILY FOR BLOOD PRESSURE     furosemide 20 MG tablet  Commonly known as:  LASIX  TAKE ONE TABLET EVERY MORNING AND TAKE ONE TABLET EVERY EVENING     labetalol 300 MG tablet  Commonly known as:  NORMODYNE  TAKE 1 TABLET TWICE DAILY     latanoprost 0.005 % ophthalmic solution  Commonly known as:  XALATAN     losartan 50 MG tablet  Commonly known as:  COZAAR  TAKE 1 TABLET ONCE DAILY FOR BLOOD PRESSURE     pravastatin 80 MG tablet  Commonly known as:  PRAVACHOL  TAKE 1 TABLET ONE TIME DAILY  TO  CONTROL  CHOLESTEROL        Review of Systems:  Review of Systems  Constitutional: Negative for fever and chills.  HENT: Positive for hearing loss.   Eyes: Positive for blurred vision.  Respiratory: Negative for shortness of breath.   Cardiovascular: Negative for chest pain, palpitations and leg swelling.  Gastrointestinal: Negative for abdominal pain, constipation, blood in stool and melena.    Genitourinary: Negative for dysuria.  Musculoskeletal: Negative for falls.  Neurological: Negative for dizziness, loss of consciousness and headaches.  Psychiatric/Behavioral: Negative for depression and memory loss.    Health Maintenance  Topic Date Due  . TETANUS/TDAP  03/14/1960  . COLON CANCER SCREENING ANNUAL FOBT  03/15/1991  . COLONOSCOPY  03/15/1991  . ZOSTAVAX  03/14/2001  . INFLUENZA VACCINE  03/03/2016  . PNA vac Low Risk Adult  Completed    Physical Exam: Filed Vitals:   12/27/15 0923  BP: 140/60  Pulse: 58  Temp: 97.6 F (36.4 C)  TempSrc: Oral  Height: 5\' 9"  (1.753 m)  Weight: 165 lb (74.844 kg)  SpO2: 99%   Body mass index is 24.36 kg/(m^2). Physical Exam  Constitutional: He is oriented to person, place, and time. He appears well-developed and well-nourished. No distress.  Cardiovascular: Normal rate, regular rhythm and intact distal pulses.   Murmur heard. 3/6 systolic murmur aortic area  Pulmonary/Chest: Effort normal and breath sounds normal.  Abdominal: Soft. Bowel sounds are normal.  Musculoskeletal: Normal range of motion.  Neurological: He is alert and oriented to person, place, and time.  Difficulty comprehending and requires things to be repeated several times and explained in detail  Skin: Skin is warm and dry.  Psychiatric: He has a normal mood and affect.    Labs reviewed: Basic Metabolic Panel:  Recent Labs  02/07/15 0838 06/14/15 1123 09/20/15 0855  NA 141 141 141  K 4.4 5.0 4.5  CL 103 103 105  CO2 21 24 21   GLUCOSE 102* 91 98  BUN 21 22 16   CREATININE 1.43* 1.41* 1.12  CALCIUM 9.5 9.1 9.1   Liver Function Tests:  Recent Labs  02/07/15 0838  AST 15  ALT 8  ALKPHOS 63  BILITOT 0.2  PROT 6.6  ALBUMIN 4.0   No results for input(s): LIPASE, AMYLASE in the last 8760 hours. No results for input(s): AMMONIA in the last 8760 hours. CBC:  Recent Labs  02/07/15 0838 06/14/15 1123 09/20/15 0855  WBC 5.1 4.6 4.5   NEUTROABS 2.6 2.6 2.3  HCT 33.2* 32.2* 32.1*  MCV 88 90 87  PLT 244 357 262   Lipid Panel:  Recent Labs  02/07/15 0838 09/20/15 0855  CHOL 145 143  HDL 47 48  LDLCALC 83 78  TRIG 73 83  CHOLHDL 3.1 3.0   Lab Results  Component Value Date   HGBA1C 5.9* 09/20/2015    Assessment/Plan 1. Essential hypertension - bp just at goal--ideally a  bit lower, but pt smoking marijuana and not taking good care of himself so hard to reach 130 - Comprehensive metabolic panel; Future  2. Hyperglycemia -glucose control was improving on last labs, f/u before next visit - Comprehensive metabolic panel; Future - Hemoglobin A1c; Future  3. Hyperlipidemia - cont statin therapy and monitor - Lipid panel; Future  4. Prostate cancer [facility]) - noted by MRI done by urology, but pt reports not having followed up with Dr. Junious Silk since then--phone number provided to pt to call to schedule  5. Chronic kidney disease, stage III (moderate) - cont to avoid nsaids and push water - Comprehensive metabolic panel; Future  6. Chronic diastolic heart failure (HCC) -stable, no acute exacerbation, wt stable  7. Anemia of chronic disease - f/u labs before next visit - CBC with Differential/Platelet; Future  8. Glaucoma -noted by ophtho and now on latanoprost eye drops  9.  Colon cancer screening -paperwork completed to do cologuard  Labs/tests ordered:   Orders Placed This Encounter  Procedures  . CBC with Differential/Platelet    Standing Status: Future     Number of Occurrences:      Standing Expiration Date: 06/28/2016  . Comprehensive metabolic panel    Standing Status: Future     Number of Occurrences:      Standing Expiration Date: 06/28/2016    Order Specific Question:  Has the patient fasted?    Answer:  Yes  . Hemoglobin A1c    Standing Status: Future     Number of Occurrences:      Standing Expiration Date: 06/28/2016  . Lipid panel    Standing Status: Future     Number of  Occurrences:      Standing Expiration Date: 06/28/2016    Order Specific Question:  Has the patient fasted?    Answer:  Yes   Next appt: 03/26/2016 for annual exam, labs before   Cornelius. Quamere Mussell, D.O. [facility] [address], [place] [address] Cell Phone (Mon-Fri 8am-5pm):  [phone] On Call:  [phone] & follow prompts after 5pm & weekends Office Phone:  [phone] Office Fax:  [phone]

## 2016-02-19 DIAGNOSIS — H401123 Primary open-angle glaucoma, left eye, severe stage: Secondary | ICD-10-CM | POA: Diagnosis not present

## 2016-02-19 DIAGNOSIS — H401111 Primary open-angle glaucoma, right eye, mild stage: Secondary | ICD-10-CM | POA: Diagnosis not present

## 2016-02-19 DIAGNOSIS — H2511 Age-related nuclear cataract, right eye: Secondary | ICD-10-CM | POA: Diagnosis not present

## 2016-02-19 DIAGNOSIS — Z961 Presence of intraocular lens: Secondary | ICD-10-CM | POA: Diagnosis not present

## 2016-03-20 NOTE — Addendum Note (Signed)
Addended by: Logan Bores on: 03/20/2016 03:07 PM   Modules accepted: Orders

## 2016-03-23 ENCOUNTER — Other Ambulatory Visit: Payer: Commercial Managed Care - HMO

## 2016-03-23 DIAGNOSIS — I10 Essential (primary) hypertension: Secondary | ICD-10-CM | POA: Diagnosis not present

## 2016-03-23 DIAGNOSIS — R739 Hyperglycemia, unspecified: Secondary | ICD-10-CM

## 2016-03-23 DIAGNOSIS — N183 Chronic kidney disease, stage 3 unspecified: Secondary | ICD-10-CM

## 2016-03-23 DIAGNOSIS — D638 Anemia in other chronic diseases classified elsewhere: Secondary | ICD-10-CM

## 2016-03-23 DIAGNOSIS — E785 Hyperlipidemia, unspecified: Secondary | ICD-10-CM | POA: Diagnosis not present

## 2016-03-23 LAB — LIPID PANEL
Cholesterol: 148 mg/dL (ref 125–200)
HDL: 61 mg/dL (ref >=40)
LDL Cholesterol: 74 mg/dL (ref <130)
Total CHOL/HDL Ratio: 2.4 Ratio (ref <=5.0)
Triglycerides: 66 mg/dL (ref <150)
VLDL: 13 mg/dL (ref <30)

## 2016-03-23 LAB — COMPREHENSIVE METABOLIC PANEL
ALT: 12 U/L (ref 9–46)
AST: 22 U/L (ref 10–35)
Albumin: 4.1 g/dL (ref 3.6–5.1)
Alkaline Phosphatase: 51 U/L (ref 40–115)
BUN: 23 mg/dL (ref 7–25)
CO2: 25 mmol/L (ref 20–31)
Calcium: 9.3 mg/dL (ref 8.6–10.3)
Chloride: 107 mmol/L (ref 98–110)
Creat: 1.39 mg/dL — ABNORMAL HIGH (ref 0.70–1.18)
Glucose, Bld: 96 mg/dL (ref 65–99)
Potassium: 4.8 mmol/L (ref 3.5–5.3)
Sodium: 138 mmol/L (ref 135–146)
Total Bilirubin: 0.3 mg/dL (ref 0.2–1.2)
Total Protein: 7 g/dL (ref 6.1–8.1)

## 2016-03-23 LAB — CBC WITH DIFFERENTIAL/PLATELET
Basophils Absolute: 44 cells/uL (ref 0–200)
Basophils Relative: 1 %
Eosinophils Absolute: 616 cells/uL — ABNORMAL HIGH (ref 15–500)
Eosinophils Relative: 14 %
HCT: 35.7 % — ABNORMAL LOW (ref 38.5–50.0)
Hemoglobin: 11.2 g/dL — ABNORMAL LOW (ref 13.2–17.1)
Lymphocytes Relative: 22 %
Lymphs Abs: 968 cells/uL (ref 850–3900)
MCH: 27.4 pg (ref 27.0–33.0)
MCHC: 31.4 g/dL — ABNORMAL LOW (ref 32.0–36.0)
MCV: 87.3 fL (ref 80.0–100.0)
MPV: 9.6 fL (ref 7.5–12.5)
Monocytes Absolute: 528 cells/uL (ref 200–950)
Monocytes Relative: 12 %
Neutro Abs: 2244 cells/uL (ref 1500–7800)
Neutrophils Relative %: 51 %
Platelets: 285 10*3/uL (ref 140–400)
RBC: 4.09 MIL/uL — ABNORMAL LOW (ref 4.20–5.80)
RDW: 16.6 % — ABNORMAL HIGH (ref 11.0–15.0)
WBC: 4.4 10*3/uL (ref 3.8–10.8)

## 2016-03-24 ENCOUNTER — Encounter: Payer: Self-pay | Admitting: *Deleted

## 2016-03-24 LAB — HEMOGLOBIN A1C
Hgb A1c MFr Bld: 5.8 % — ABNORMAL HIGH (ref <5.7)
Mean Plasma Glucose: 120 mg/dL

## 2016-03-26 ENCOUNTER — Ambulatory Visit (INDEPENDENT_AMBULATORY_CARE_PROVIDER_SITE_OTHER): Payer: Commercial Managed Care - HMO | Admitting: Internal Medicine

## 2016-03-26 ENCOUNTER — Encounter: Payer: Self-pay | Admitting: Internal Medicine

## 2016-03-26 VITALS — BP 138/80 | HR 57 | Temp 97.6°F | Ht 69.0 in | Wt 166.0 lb

## 2016-03-26 DIAGNOSIS — I70213 Atherosclerosis of native arteries of extremities with intermittent claudication, bilateral legs: Secondary | ICD-10-CM

## 2016-03-26 DIAGNOSIS — N183 Chronic kidney disease, stage 3 unspecified: Secondary | ICD-10-CM

## 2016-03-26 DIAGNOSIS — R739 Hyperglycemia, unspecified: Secondary | ICD-10-CM | POA: Diagnosis not present

## 2016-03-26 DIAGNOSIS — R011 Cardiac murmur, unspecified: Secondary | ICD-10-CM | POA: Diagnosis not present

## 2016-03-26 DIAGNOSIS — E785 Hyperlipidemia, unspecified: Secondary | ICD-10-CM | POA: Diagnosis not present

## 2016-03-26 DIAGNOSIS — C61 Malignant neoplasm of prostate: Secondary | ICD-10-CM

## 2016-03-26 DIAGNOSIS — Z0000 Encounter for general adult medical examination without abnormal findings: Secondary | ICD-10-CM

## 2016-03-26 NOTE — Progress Notes (Signed)
Location:  [facility] clinic Provider: Nazier Neyhart L. Mariea Clonts, D.O., C.M.D.  Patient Care Team: Gayland Curry, DO as PCP - General (Geriatric Medicine) Adrian Prows, MD as Consulting Physician (Cardiology) Clent Jacks, MD as Consulting Physician (Ophthalmology) Mal Misty, MD as Consulting Physician (Vascular Surgery) Garvin Fila, MD as Consulting Physician (Neurology) Hayden Pedro, MD as Consulting Physician (Ophthalmology)  Extended Emergency Contact Information Primary Emergency Contact: Lolita Patella, [place] [address] [address] of [place] Phone: [phone] Relation: Son Secondary Emergency Contact: Willette Cluster, [place] of [place] Phone: [phone] Relation: Daughter  Code Status: full code--reviewed today and he does want to remain full code Goals of Care: Advanced Directive information Advanced Directives 03/26/2016  Does patient have an advance directive? No  Would patient like information on creating an advanced directive? No - patient declined information   Chief Complaint  Patient presents with  . Annual Exam    wellness visit  . MMSE    28/30 passed clock    HPI: Patient is a 75 y.o. [name] seen in today for an annual wellness exam.    Paperwork was completed to do cologuard in May.  It appears he did not follow through on the test.    Intermittent claudication is worsening.  He had been able to walk about a 1/4 mile, but now he can barely go a block w/o pain which improves with rest.    Depression screen [facility] 2/9 03/26/2016 12/27/2015 07/09/2014 06/05/2013  Decreased Interest 0 0 0 0  Down, Depressed, Hopeless 0 0 0 0  PHQ - 2 Score 0 0 0 0    Fall Risk  03/26/2016 12/27/2015 02/11/2015 11/12/2014 07/09/2014  Falls in the past year? No No No No No   MMSE - Mini Mental State Exam 03/26/2016  Orientation to time 5  Orientation to Place 5  Registration 3  Attention/ Calculation 5  Recall 2  Language-  name 2 objects 2  Language- repeat 1  Language- follow 3 step command 2  Language- read & follow direction 1  Write a sentence 1  Copy design 1  Total score 28  passed clock   Health Maintenance  Topic Date Due  . TETANUS/TDAP  03/14/1960  . COLON CANCER SCREENING ANNUAL FOBT  03/15/1991  . COLONOSCOPY  03/15/1991  . ZOSTAVAX  03/14/2001  . INFLUENZA VACCINE  03/03/2016  . PNA vac Low Risk Adult  Completed    Functional Status Survey: Is the patient deaf or have difficulty hearing?: No Does the patient have difficulty seeing, even when wearing glasses/contacts?: No Does the patient have difficulty concentrating, remembering, or making decisions?: No Does the patient have difficulty walking or climbing stairs?: Yes (legs get tired when he walks less than a block, has to rest) Does the patient have difficulty dressing or bathing?: No Does the patient have difficulty doing errands alone such as visiting a doctor's office or shopping?: No Current Exercise Habits: Home exercise routine, Type of exercise: strength training/weights;Other - see comments (mowing lawn in patches), Time (Minutes): 10, Frequency (Times/Week): 4, Weekly Exercise (Minutes/Week): 40, Intensity: Mild Exercise limited by: orthopedic condition(s);Other - see comments Diet?  Does not follow special diet Vision Screening Comments: Had eye exam last month, can't remember name or when. Hearing:  No problem Dentition:  Missing several teeth--does not see dentist due to cost  Past Medical History:  Diagnosis Date  . Anemia   . Cataract    traumatic  . Chronic diastolic heart failure ([place])   . Chronic kidney disease, stage III (moderate)   . Depressive disorder, not elsewhere classified   . Depressive disorder, not elsewhere classified   . Elevated prostate specific antigen (PSA)   . Elevated prostate specific antigen (PSA)   . Gingivitis   . Hyperlipidemia   . Hypertension   . Hypertensive kidney disease,  benign   . 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 Microscopic hematuria   . Nondependent cannabis abuse   . 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 Peripheral vascular disease, unspecified ([place])   . Pure hypercholesterolemia   . Unspecified essential hypertension   . Unspecified late effects of cerebrovascular disease   . Unspecified late effects of cerebrovascular disease   . Unspecified vitamin D deficiency   . Unspecified vitamin D deficiency     Past Surgical History:  Procedure Laterality Date  . CATARACT EXTRACTION    . CORONARY STENT PLACEMENT     2008  . EYE SURGERY     left eye,    cornea repair    Family History  Problem Relation Age of Onset  . Cancer Father     Social History   Social History  . Marital status: Divorced    Spouse name: N/A  . Number of children: N/A  . Years of education: N/A   Social History Main Topics  . Smoking status: Former Smoker    Quit date: 03/09/1971  . Smokeless tobacco: Never Used  . Alcohol use No  . Drug use: No  . Sexual activity: Not Asked   Other Topics Concern  . None   Social History Narrative  . None    reports that he quit smoking about 45 years ago. He has never used smokeless tobacco. He reports that he does not drink alcohol or use drugs.  Allergies  Allergen Reactions  . Penicillins       Medication List       Accurate as of 03/26/16  9:49 AM. Always use your most recent med list.          amLODipine 10 MG tablet Commonly known as:  NORVASC TAKE 1 TABLET ONCE DAILY FOR BLOOD PRESSURE   aspirin 81 MG tablet Take 81 mg by mouth daily.   CALTRATE 600+D 600-800 MG-UNIT Tabs Generic drug:  Calcium Carb-Cholecalciferol TAKE 1 TABLET TWICE DAILY   clopidogrel 75 MG tablet Commonly known as:   PLAVIX TAKE 1 TABLET ONCE DAILY TO HELP WITH CIRCULATION   doxazosin 4 MG tablet Commonly known as:  CARDURA TAKE ONE TABLET BY MOUTH ONCE DAILY FOR BLOOD PRESSURE   furosemide 20 MG tablet Commonly known as:  LASIX TAKE ONE TABLET EVERY MORNING AND TAKE ONE TABLET EVERY EVENING   labetalol 300 MG tablet Commonly known as:  NORMODYNE TAKE 1 TABLET TWICE DAILY   latanoprost 0.005 % ophthalmic solution Commonly known as:  XALATAN   losartan 50 MG tablet Commonly known as:  COZAAR TAKE 1 TABLET ONCE DAILY FOR BLOOD PRESSURE   pravastatin 80 MG tablet Commonly known as:  PRAVACHOL TAKE 1 TABLET ONE TIME DAILY  TO  CONTROL  CHOLESTEROL        Review of Systems:  Review of Systems  Constitutional: Negative for chills and fever.  HENT: Negative for hearing loss.   Eyes: Negative for blurred vision.       Has retinal disease and glaucoma, but passed driver's test even w/o glasses  Respiratory: Negative for cough and shortness of breath.   Cardiovascular: Positive for claudication. Negative for chest pain, palpitations, orthopnea, leg swelling and PND.       Legs get tired  Gastrointestinal: Negative for abdominal pain, blood in stool, constipation and melena.  Genitourinary: Positive for urgency. Negative for dysuria and frequency.  Musculoskeletal: Negative for falls.  Skin: Negative for itching and rash.  Neurological: Positive for focal weakness. Negative for dizziness, tingling, sensory change and loss of consciousness.       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  Psychiatric/Behavioral: Negative for depression and memory loss.    Physical Exam: Vitals:   03/26/16 0907  BP: 138/80  Pulse: (!) 57  Temp: 97.6 F (36.4 C)  TempSrc: Oral  SpO2: 96%  Weight: 166 lb (75.3 kg)  Height: 5\' 9"  (1.753 m)   Body mass index is 24.51 kg/m. Physical Exam  Constitutional: He is oriented to person, place, and time. He  appears well-developed. No distress.  HENT:  Head: Normocephalic and atraumatic.  Right Ear: External ear normal.  Left Ear: External ear normal.  Nose: Nose normal.  Mouth/Throat: Oropharynx is clear and moist. No oropharyngeal exudate.  Missing many of his teeth and remaining teeth are in poor condition--cannot afford treatment  Eyes: Conjunctivae and EOM are normal. Pupils are equal, round, and reactive to light.  Neck: Normal range of motion. Neck supple. No JVD present.  Cardiovascular: Normal rate, regular rhythm and intact distal pulses.   Murmur heard. DP pulses intact, feet are warm  Pulmonary/Chest: Effort normal and breath sounds normal.  Abdominal: Soft. Bowel sounds are normal.  Musculoskeletal: Normal range of motion.  Lymphadenopathy:    He has no cervical adenopathy.  Neurological: He is alert and oriented to person, place, and time.  Skin: Skin is warm and dry.  Dry scaly skin  Psychiatric:  Flat affect    Labs reviewed: Basic Metabolic Panel:  Recent Labs  06/14/15 1123 09/20/15 0855 03/23/16 0820  NA 141 141 138  K 5.0 4.5 4.8  CL 103 105 107  CO2 24 21 25   GLUCOSE 91 98 96  BUN 22 16 23   CREATININE 1.41* 1.12 1.39*  CALCIUM 9.1 9.1 9.3   Liver Function Tests:  Recent Labs  03/23/16 0820  AST 22  ALT 12  ALKPHOS 51  BILITOT 0.3  PROT 7.0  ALBUMIN 4.1   No results for input(s): LIPASE, AMYLASE in the last 8760 hours. No results for input(s): AMMONIA in the last 8760 hours. CBC:  Recent Labs  06/14/15 1123 09/20/15 0855 03/23/16 0820  WBC 4.6 4.5 4.4  NEUTROABS 2.6 2.3 2,244  HGB  --   --  11.2*  HCT 32.2* 32.1* 35.7*  MCV 90 87 87.3  PLT 357 262 285   Lipid Panel:  Recent Labs  09/20/15 0855 03/23/16 0820  CHOL 143 148  HDL 48 61  LDLCALC 78 74  TRIG 83 66  CHOLHDL 3.0 2.4   Lab Results  Component Value Date   HGBA1C 5.8 (H) 03/23/2016     Assessment/Plan 1. Medicare annual wellness visit, subsequent -see hpi  and flowsheets/encounter -wants to wait on his flu shot -has not gone to pharmacy  for tdap when rx given -has also not gotten zostavax at pharmacy and cost prohibitive in office for him -needs to complete the cologuard that was ordered  2. Atherosclerosis of native artery of both lower extremities with intermittent claudication (HCC) -seems his symptoms are worsening so advised him to f/u with Dr. Einar Gip on this and his aortic murmur that seems more prominent now to me  3. Murmur, cardiac -has become louder, but he has no related symptoms -needs f/u with cardiology anyway to keep an eye on his valve  4. Hyperglycemia -f/u labs next visit  5. Hyperlipidemia -f/u labs next time, cont pravachol high dose  6. Prostate cancer [facility]) -follows with Dr. Junious Silk for his PSA monitoring  7. Chronic kidney disease, stage III (moderate) -has been stable, cont to monitor, avoid nsaids, hydrate  Labs/tests ordered:  cologuard reordered, cards referral  Next appt:  3 mos for med mgt  Gentry Pilson L. Akita Maxim, D.O. [facility] [address], [place] [address] Cell Phone (Mon-Fri 8am-5pm):  [phone] On Call:  [phone] & follow prompts after 5pm & weekends Office Phone:  [phone] Office Fax:  [phone]

## 2016-05-12 DIAGNOSIS — I739 Peripheral vascular disease, unspecified: Secondary | ICD-10-CM | POA: Diagnosis not present

## 2016-05-12 DIAGNOSIS — I359 Nonrheumatic aortic valve disorder, unspecified: Secondary | ICD-10-CM | POA: Diagnosis not present

## 2016-05-12 DIAGNOSIS — I10 Essential (primary) hypertension: Secondary | ICD-10-CM | POA: Diagnosis not present

## 2016-05-12 DIAGNOSIS — E7800 Pure hypercholesterolemia, unspecified: Secondary | ICD-10-CM | POA: Diagnosis not present

## 2016-06-02 ENCOUNTER — Encounter: Payer: Self-pay | Admitting: Internal Medicine

## 2016-06-02 DIAGNOSIS — I351 Nonrheumatic aortic (valve) insufficiency: Secondary | ICD-10-CM | POA: Diagnosis not present

## 2016-06-16 ENCOUNTER — Encounter: Payer: Self-pay | Admitting: Internal Medicine

## 2016-06-16 DIAGNOSIS — I739 Peripheral vascular disease, unspecified: Secondary | ICD-10-CM | POA: Diagnosis not present

## 2016-06-16 DIAGNOSIS — R0989 Other specified symptoms and signs involving the circulatory and respiratory systems: Secondary | ICD-10-CM | POA: Diagnosis not present

## 2016-06-16 DIAGNOSIS — Z8673 Personal history of transient ischemic attack (TIA), and cerebral infarction without residual deficits: Secondary | ICD-10-CM | POA: Diagnosis not present

## 2016-06-29 ENCOUNTER — Encounter: Payer: Self-pay | Admitting: Internal Medicine

## 2016-06-29 ENCOUNTER — Ambulatory Visit (INDEPENDENT_AMBULATORY_CARE_PROVIDER_SITE_OTHER): Payer: Commercial Managed Care - HMO | Admitting: Internal Medicine

## 2016-06-29 VITALS — BP 160/80 | HR 61 | Temp 97.6°F | Ht 69.0 in | Wt 165.0 lb

## 2016-06-29 DIAGNOSIS — Z23 Encounter for immunization: Secondary | ICD-10-CM

## 2016-06-29 DIAGNOSIS — I63239 Cerebral infarction due to unspecified occlusion or stenosis of unspecified carotid arteries: Secondary | ICD-10-CM

## 2016-06-29 DIAGNOSIS — R739 Hyperglycemia, unspecified: Secondary | ICD-10-CM

## 2016-06-29 DIAGNOSIS — N183 Chronic kidney disease, stage 3 unspecified: Secondary | ICD-10-CM

## 2016-06-29 DIAGNOSIS — D631 Anemia in chronic kidney disease: Secondary | ICD-10-CM

## 2016-06-29 DIAGNOSIS — I10 Essential (primary) hypertension: Secondary | ICD-10-CM

## 2016-06-29 DIAGNOSIS — I5032 Chronic diastolic (congestive) heart failure: Secondary | ICD-10-CM

## 2016-06-29 DIAGNOSIS — C61 Malignant neoplasm of prostate: Secondary | ICD-10-CM | POA: Diagnosis not present

## 2016-06-29 DIAGNOSIS — E7800 Pure hypercholesterolemia, unspecified: Secondary | ICD-10-CM | POA: Diagnosis not present

## 2016-06-29 DIAGNOSIS — I739 Peripheral vascular disease, unspecified: Secondary | ICD-10-CM | POA: Diagnosis not present

## 2016-06-29 DIAGNOSIS — I359 Nonrheumatic aortic valve disorder, unspecified: Secondary | ICD-10-CM | POA: Diagnosis not present

## 2016-06-29 LAB — CBC WITH DIFFERENTIAL/PLATELET
Basophils Absolute: 0 cells/uL (ref 0–200)
Basophils Relative: 0 %
Eosinophils Absolute: 714 cells/uL — ABNORMAL HIGH (ref 15–500)
Eosinophils Relative: 14 %
HCT: 38.6 % (ref 38.5–50.0)
Hemoglobin: 12.3 g/dL — ABNORMAL LOW (ref 13.2–17.1)
Lymphocytes Relative: 14 %
Lymphs Abs: 714 cells/uL — ABNORMAL LOW (ref 850–3900)
MCH: 29.1 pg (ref 27.0–33.0)
MCHC: 31.9 g/dL — ABNORMAL LOW (ref 32.0–36.0)
MCV: 91.3 fL (ref 80.0–100.0)
MPV: 9.9 fL (ref 7.5–12.5)
Monocytes Absolute: 714 cells/uL (ref 200–950)
Monocytes Relative: 14 %
Neutro Abs: 2958 cells/uL (ref 1500–7800)
Neutrophils Relative %: 58 %
Platelets: 241 10*3/uL (ref 140–400)
RBC: 4.23 MIL/uL (ref 4.20–5.80)
RDW: 14.4 % (ref 11.0–15.0)
WBC: 5.1 10*3/uL (ref 3.8–10.8)

## 2016-06-29 LAB — COMPLETE METABOLIC PANEL WITH GFR
ALT: 16 U/L (ref 9–46)
AST: 25 U/L (ref 10–35)
Albumin: 3.9 g/dL (ref 3.6–5.1)
Alkaline Phosphatase: 57 U/L (ref 40–115)
BUN: 27 mg/dL — ABNORMAL HIGH (ref 7–25)
CO2: 25 mmol/L (ref 20–31)
Calcium: 9.1 mg/dL (ref 8.6–10.3)
Chloride: 110 mmol/L (ref 98–110)
Creat: 1.23 mg/dL — ABNORMAL HIGH (ref 0.70–1.18)
GFR, Est African American: 66 mL/min (ref >=60)
GFR, Est Non African American: 57 mL/min — ABNORMAL LOW (ref >=60)
Glucose, Bld: 96 mg/dL (ref 65–99)
Potassium: 4.7 mmol/L (ref 3.5–5.3)
Sodium: 140 mmol/L (ref 135–146)
Total Bilirubin: 0.2 mg/dL (ref 0.2–1.2)
Total Protein: 6.6 g/dL (ref 6.1–8.1)

## 2016-06-29 LAB — HEMOGLOBIN A1C
Hgb A1c MFr Bld: 5.7 % — ABNORMAL HIGH (ref <5.7)
Mean Plasma Glucose: 117 mg/dL

## 2016-06-29 NOTE — Progress Notes (Signed)
Location:  [facility] clinic Provider:  Tierrah Anastos L. Mariea Clonts, D.O., C.M.D.  Code Status: Full code Goals of Care:  Advanced Directives 03/26/2016  Does Patient Have a Medical Advance Directive? No  Would patient like information on creating a medical advance directive? No - patient declined information   Chief Complaint  Patient presents with  . Medical Management of Chronic Issues    3 mth follow-up    HPI: Patient is a 75 y.o. male seen today for medical management of chronic diseases.    BP elevated upon arrival.  Goal at least <140/90 (ideally less than 130 considering his cerebrovascular disease).  Says he stopped eating pineapple.  He will go back to it.  On triple therapy and did take his meds this am.  No known increase in salty food--denies this.    Hyperglycemia:  Will f/u hba1c.  Last 5.8  Agrees to flu shot after some convincing.  Has his f/u visit with Dr. Einar Gip after is ultrasound studies were done.    Keeps forgetting to do the cologuard testing he says.  Says he cannot afford any dental work around the holidays so he will hold off on C3 referral to help with that.    Past Medical History:  Diagnosis Date  . Anemia   . Cataract    traumatic  . Chronic diastolic heart failure ([place])   . Chronic kidney disease, stage III (moderate)   . Depressive disorder, not elsewhere classified   . Depressive disorder, not elsewhere classified   . Elevated prostate specific antigen (PSA)   . Elevated prostate specific antigen (PSA)   . Gingivitis   . Hyperlipidemia   . Hypertension   . Hypertensive kidney disease, benign   . 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 Microscopic hematuria   . Nondependent cannabis abuse   . 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 Peripheral  vascular disease, unspecified   . Pure hypercholesterolemia   . Unspecified essential hypertension   . Unspecified late effects of cerebrovascular disease   . Unspecified late effects of cerebrovascular disease   . Unspecified vitamin D deficiency   . Unspecified vitamin D deficiency     Past Surgical History:  Procedure Laterality Date  . CATARACT EXTRACTION    . CORONARY STENT PLACEMENT     2008  . EYE SURGERY     left eye,    cornea repair    Allergies  Allergen Reactions  . Penicillins       Medication List       Accurate as of 06/29/16  8:34 AM. Always use your most recent med list.          amLODipine 10 MG tablet Commonly known as:  NORVASC TAKE 1 TABLET ONCE DAILY FOR BLOOD PRESSURE   aspirin 81 MG tablet Take 81 mg by mouth daily.   CALTRATE 600+D 600-800 MG-UNIT Tabs Generic drug:  Calcium Carb-Cholecalciferol TAKE 1 TABLET TWICE DAILY   clopidogrel 75 MG tablet Commonly known as:  PLAVIX TAKE 1 TABLET ONCE DAILY TO HELP WITH CIRCULATION   doxazosin 4 MG tablet Commonly known as:  CARDURA TAKE ONE TABLET BY MOUTH ONCE DAILY FOR BLOOD PRESSURE   furosemide 20 MG tablet Commonly known as:  LASIX TAKE ONE TABLET EVERY MORNING AND TAKE ONE TABLET EVERY  EVENING   labetalol 300 MG tablet Commonly known as:  NORMODYNE TAKE 1 TABLET TWICE DAILY   latanoprost 0.005 % ophthalmic solution Commonly known as:  XALATAN   losartan 50 MG tablet Commonly known as:  COZAAR TAKE 1 TABLET ONCE DAILY FOR BLOOD PRESSURE   pravastatin 80 MG tablet Commonly known as:  PRAVACHOL TAKE 1 TABLET ONE TIME DAILY  TO  CONTROL  CHOLESTEROL       Review of Systems:  Review of Systems  Constitutional: Negative for chills, fever and malaise/fatigue.  HENT: Negative for congestion.   Eyes: Negative for blurred vision.  Respiratory: Negative for cough and shortness of breath.   Cardiovascular: Negative for chest pain, palpitations and leg swelling.    Gastrointestinal: Negative for abdominal pain, blood in stool, constipation, diarrhea and melena.  Genitourinary: Positive for frequency. Negative for dysuria, hematuria and urgency.  Musculoskeletal: Negative for falls and joint pain.  Skin: Negative for itching and rash.  Neurological: Negative for dizziness, loss of consciousness and weakness.  Endo/Heme/Allergies: Does not bruise/bleed easily.  Psychiatric/Behavioral: Negative for depression and memory loss.    Health Maintenance  Topic Date Due  . TETANUS/TDAP  03/14/1960  . COLON CANCER SCREENING ANNUAL FOBT  03/15/1991  . COLONOSCOPY  03/15/1991  . ZOSTAVAX  03/14/2001  . INFLUENZA VACCINE  03/03/2016  . PNA vac Low Risk Adult  Completed    Physical Exam: Vitals:   06/29/16 0828  BP: (!) 160/80  Pulse: 61  Temp: 97.6 F (36.4 C)  TempSrc: Oral  SpO2: 98%  Weight: 165 lb (74.8 kg)  Height: 5\' 9"  (1.753 m)   Body mass index is 24.37 kg/m. Physical Exam  Constitutional: He is oriented to person, place, and time. He appears well-developed and well-nourished. No distress.  Cardiovascular: Normal rate and regular rhythm.   Murmur heard. Pulmonary/Chest: Effort normal and breath sounds normal. No respiratory distress.  Abdominal: Soft. Bowel sounds are normal. He exhibits no distension.  Musculoskeletal: Normal range of motion.  Neurological: He is alert and oriented to person, place, and time.  Skin: Skin is warm and dry. Capillary refill takes less than 2 seconds.    Labs reviewed: Basic Metabolic Panel:  Recent Labs  09/20/15 0855 03/23/16 0820  NA 141 138  K 4.5 4.8  CL 105 107  CO2 21 25  GLUCOSE 98 96  BUN 16 23  CREATININE 1.12 1.39*  CALCIUM 9.1 9.3   Liver Function Tests:  Recent Labs  03/23/16 0820  AST 22  ALT 12  ALKPHOS 51  BILITOT 0.3  PROT 7.0  ALBUMIN 4.1   No results for input(s): LIPASE, AMYLASE in the last 8760 hours. No results for input(s): AMMONIA in the last 8760  hours. CBC:  Recent Labs  09/20/15 0855 03/23/16 0820  WBC 4.5 4.4  NEUTROABS 2.3 2,244  HGB  --  11.2*  HCT 32.1* 35.7*  MCV 87 87.3  PLT 262 285   Lipid Panel:  Recent Labs  09/20/15 0855 03/23/16 0820  CHOL 143 148  HDL 48 61  LDLCALC 78 74  TRIG 83 66  CHOLHDL 3.0 2.4   Lab Results  Component Value Date   HGBA1C 5.8 (H) 03/23/2016    Assessment/Plan 1. Prostate cancer ([place]) - no change in symptoms -  f/u lab: - PSA  2. Chronic kidney disease, stage III (moderate) - f/u labs: COMPLETE METABOLIC PANEL WITH GFR -avoid nsaids and other nephrotoxic agents and dose reduce as needed  3. Chronic  diastolic heart failure (HCC) -cont lasix, cozaar, labetalol,  f/u cmp  4. Carotid stenosis, symptomatic, with infarction [facility]) -followed by Dr. Einar Gip, just had his dopplers done, has the f/u appt today   5. Essential hypertension -bp not improved on recheck -given it's been normal the past three checks, will not make changes today -we'll see where it runs at his cardiology appt later today  6. Hyperglycemia - f/u labs - Hemoglobin A1c  7. Anemia in stage 3 chronic kidney disease - no symptoms, f/u lab - CBC with Differential/Platelet  8. Need for prophylactic vaccination and inoculation against influenza -flu shot given after encouragement  Labs/tests ordered:   Orders Placed This Encounter  Procedures  . PSA  . Hemoglobin A1c  . COMPLETE METABOLIC PANEL WITH GFR    [facility]  . CBC with Differential/Platelet    Next appt:  4 mos med mgt  Darrick Greenlaw L. Kelven Flater, D.O. [facility] [address], [place] [address] Cell Phone (Mon-Fri 8am-5pm):  [phone] On Call:  [phone] & follow prompts after 5pm & weekends Office Phone:  [phone] Office Fax:  [phone]

## 2016-06-30 ENCOUNTER — Other Ambulatory Visit: Payer: Self-pay | Admitting: Internal Medicine

## 2016-06-30 DIAGNOSIS — H2511 Age-related nuclear cataract, right eye: Secondary | ICD-10-CM | POA: Diagnosis not present

## 2016-06-30 DIAGNOSIS — H401111 Primary open-angle glaucoma, right eye, mild stage: Secondary | ICD-10-CM | POA: Diagnosis not present

## 2016-06-30 DIAGNOSIS — Z961 Presence of intraocular lens: Secondary | ICD-10-CM | POA: Diagnosis not present

## 2016-06-30 DIAGNOSIS — H401123 Primary open-angle glaucoma, left eye, severe stage: Secondary | ICD-10-CM | POA: Diagnosis not present

## 2016-06-30 LAB — PSA: PSA: 14.4 ng/mL — ABNORMAL HIGH (ref <=4.0)

## 2016-07-10 ENCOUNTER — Telehealth: Payer: Self-pay | Admitting: Internal Medicine

## 2016-07-10 ENCOUNTER — Encounter: Payer: Self-pay | Admitting: *Deleted

## 2016-07-10 NOTE — Telephone Encounter (Signed)
left msg asking pt to confirm this AWV appt w/ nurse. VDM (DD) °

## 2016-07-21 DIAGNOSIS — I359 Nonrheumatic aortic valve disorder, unspecified: Secondary | ICD-10-CM | POA: Diagnosis not present

## 2016-07-21 DIAGNOSIS — I739 Peripheral vascular disease, unspecified: Secondary | ICD-10-CM | POA: Diagnosis not present

## 2016-07-26 DIAGNOSIS — I739 Peripheral vascular disease, unspecified: Secondary | ICD-10-CM | POA: Diagnosis present

## 2016-07-26 NOTE — H&P (Signed)
OFFICE VISIT NOTES COPIED TO EPIC FOR DOCUMENTATION  . History of Present Illness Laverda Page MD; 06/29/2016 12:44 PM) Patient words: FU echo, carotid, Le Duplex; Last OV 05/12/2016.  The patient is a 75 year old male who presents for a Follow-up for Aortic regurgitation. Patient is a pleasant African-American male with history of prior stroke wwith mild residual right-sided weakness and history of moderate aortic regurgitation, is here for follow-up of aortic valve disorder, carotid bruit and claudication. Patient's lifestyle is clearly decreased with decreased physical activity due to claudication symptoms, states that both legs are equally worse. He has not had any chest pain, no shortness of breath, no PND or orthopnea. Patient's symptoms of claudication have worsened since last office visit a year ago. His activity is also limited due to back pain and spinal stenosis and prior stroke with right hemiparesis, however states that he was able to walk much more after his stroke then he is presently in both his calves and legs feel very weak and tired when he starts to walk. He has L4 S1 disc compression.  He denies ulcerations in his foot or bluish discoloration of his toes. He is a nonsmoker. No recurrence of TIA or stroke. He is on chronic dual antiplatelet therapy and presents for f/u. He is tolerating all his medications well without any side effects. No leg edema painful swelling of the lower extremities. No dizziness or syncope. No palpitations.  Additional reasons for visit:  Follow-up for Peripheral vascular disease    Problem List/Past Medical Franky Macho Reader; 06/29/2016 10:55 AM) Essential hypertension, benign (401.1) (I10)  Pure hypercholesterolemia (E78.00)  Aortic valve disorder (I35.9)  Echocardiogram 06/02/2016: Left ventricle cavity is normal in size. Moderate concentric hypertrophy of the left ventricle. Normal global wall motion. Doppler evidence of grade  II (pseudonormal) diastolic dysfunction, elevated LAP. Calculated EF 70%. Left atrial cavity is mildly dilated at 4.4 cm. Mild calcification of the aortic valve annulus. Moderate aortic valve leaflet calcification. Mildly restricted aortic valve leaflets. Mild aortic valve stenosis. Aortic valve peak pressure gradient of 35 and mean gradient of 16 mmHg, calculated aortic valve area 1.38 cm. Moderate (Grade III) aortic regurgitation. Mild tricuspid regurgitation. No evidence of pulmonary hypertension. No significant change in the study from 06/12/2014. Labwork  Labs 03/23/2016: Potassium 4.8, serum glucose 96 mg, BUN 23, serum creatinine 1.39, CMP otherwise normal. Total cholesterol 148, triglycerides 66, HDL 61, LDL 74. A1c 5.8%. Labs 02/05/2014: BUN 22, serum creatinine 1.28, eGFR 64 mL. Compared to prior BMP 6 months ago, EGFR is improved from 53 mL BMP 10/02/2013: Serum glucose 100 mg, BUN 35, serum creatinine 1.5 one, eGFR 53 mL. Potassium was 4.5. CBC revealed a hemoglobin of 11.8 with a hematocrit of 36.4, normal indicis. No change from Labs done on 10/01/2011 Peripheral artery disease (I73.9)  Lower extremity arterial duplex 06/16/2016: No hemodynamically significant stenoses are identified in the right lower extremity arterial system. There is severe diffuse mixed plaque throughout. Moderate velocity increase at the left mid superficial femoral artery suggesting >50% stenosis. There is severe diffuse mixed plaque througout. This exam reveals severely decreased perfusion of the right lower extremity with RABI 0.46 and moderately decreased perfusion of the left lower extremity with LABI 0.69, noted at the post tibial artery level. 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 Antegrade vertebral artery flow. Follow up in 12 months is  appropriate if  clinically indicated. 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 There is evidence of calcific plaque and mixed plaque in the bilateral carotid artery. Carotid doppler 6/08 Mild plaque. No significant stenosis.  Allergies (Charavina Reader; 2016-07-17 10:55 AM) Penicillin G Pot in Dextrose *PENICILLINS*  Anaphylaxis, Dizziness. Lightheadedness  Family History Franky Macho Reader; Jul 17, 2016 10:55 AM) Mother  Deceased. at age 39 from natural causes; no heart attacks or strokes, no known cardiovascular conditions Father  Deceased. at age 39 from lung cancer; no heart attacks or strokes, no cardiovascular conditions Brother 1  Deceased. at age 13 from unknown causes; no heart attacks or strokes, no cardiovascular conditions; 1 1/2 yr older  Social History Franky Macho Reader; 2016/07/17 10:55 AM) Non Drinker/No Alcohol Use  Marital status  Divorced. Living Situation  Son lives with him. Number of Children  2. Current tobacco use  Former smoker. quit in the 70's.  Past Surgical History Franky Macho Reader; July 17, 2016 10:55 AM) Left eye surgery to repair hole in retina 06/19/08   Medication History (Charavina Reader; 07-17-16 10:57 AM) Pravastatin Sodium (80MG Tablet, 1 (one) Tablet Tablet Tablet Oral daily in the evenig, Taken starting 09/13/2013) Active. Labetalol HCl (300MG Tablet, 1 (one) Tablet Tablet Tablet Oral two times daily, Taken starting 09/13/2013) Active. AmLODIPine Besylate (10MG Tablet, 1 (one) Tablet Tablet Tablet Oral daily, Taken starting 09/13/2013) Active. Losartan Potassium (50MG Tablet, 1 Tablet Tablet Oral daily, Taken starting 09/13/2013) Active. Doxazosin Mesylate (4MG Tablet, 1 (one) Tablet Tablet Tablet Oral daily  in th eevening, Taken starting 09/13/2013) Active. Calcium 500 + Vitamin D (1 three times daily) Active. Plavix (75MG Tablet, 1 Oral daily) Active. Furosemide (20MG Tablet, 1 Oral two times daily) Active. Aspirin (81MG Tablet, 1 Oral daily) Active. Medications Reconciled (verbally with pt/ no list or medications present)  Diagnostic Studies History (Charavina Reader; 2016-07-17 10:55 AM) Carotid Doppler 06/16/2016 Stenosis in the right internal carotid artery (50-69%), lower end of spectrum. Antegrade vertebral artery flow. Follow up in 12 months is appropriate if clinically indicated. Lower Extremity Doppler 06/16/2016 No hemodynamically significant stenoses are identified in the right lower extremity arterial system. There is severe diffuse mixed plaque throughout. Moderate velocity increase at the left mid superficial femoral artery suggesting >50% stenosis. There is severe diffuse mixed plaque througout. This exam reveals severely decreased perfusion of the right lower extremity with RABI 0.46 and moderately decreased perfusion of the left lower extremity with LABI 0.69, noted at the post tibial artery level. Echocardiogram 06/02/2016 Left ventricle cavity is normal in size. Moderate concentric hypertrophy of the left ventricle. Normal global wall motion. Doppler evidence of grade II (pseudonormal) diastolic dysfunction, elevated LAP. Calculated EF 70%. Left atrial cavity is mildly dilated at 4.4 cm. Mild calcification of the aortic valve annulus. Moderate aortic valve leaflet calcification. Mildly restricted aortic valve leaflets. Mild aortic valve stenosis. Aortic valve peak pressure gradient of 35 and mean gradient of 16 mmHg, calculated aortic valve area 1.38 cm. Moderate (Grade III) aortic regurgitation. Mild tricuspid regurgitation. No evidence of pulmonary hypertension. No significant change in the study from 06/12/2014. Labwork  Labs 03/23/2016: Potassium 4.8, serum glucose 96 mg,  BUN 23, serum creatinine 1.39, CMP otherwise normal. Total cholesterol 148, triglycerides 66, HDL 61, LDL 74. A1c 5.8%. Labs 02/05/2014: BUN 22, serum creatinine 1.28, eGFR 64 mL. Compared to prior BMP 6 months ago,  EGFR is improved from 53 mL BMP 10/02/2013: Serum glucose 100 mg, BUN 35, serum creatinine 1.5 one, eGFR 53 mL. Potassium was 4.5. CBC revealed a hemoglobin of 11.8 with a hematocrit of 36.4, normal indicis. No change from Labs done on 10/01/2011  Other Problems Franky Macho Reader; 06/29/2016 10:55 AM) Adalimumab (Humira) long-term use (E28.003)  Stroke 12/31/06 right hemiparesis     Review of Systems Laverda Page, MD; 06/29/2016 12:44 PM) General Present- Feeling well. Not Present- Fatigue, Tiredness and Unable to Sleep Lying Flat. HEENT Not Present- Blurred Vision. Respiratory Not Present- Bloody sputum, Difficulty Breathing on Exertion and Wakes up from Sleep Wheezing or Short of Breath. Cardiovascular Present- Claudications (both legs and relieved with rest) and Leg Cramps. Not Present- Edema, Palpitations and Paroxysmal Nocturnal Dyspnea. Gastrointestinal Not Present- Black, Tarry Stool, Bloody Stool and Heartburn. Musculoskeletal Present- Backache (has degenerative disk disease and spinal compression L44-5) and Leg Weakness (right leg since stroke). Neurological Present- Weakness (right sided weakness). Psychiatric Not Present- Personality Changes and Suicidal Ideation. Hematology Not Present- Blood Clots, Easy Bruising and Nose Bleed.  Vitals Franky Macho Reader; 06/29/2016 10:58 AM) 06/29/2016 10:55 AM Weight: 168.06 lb Height: 69in Body Surface Area: 1.92 m Body Mass Index: 24.82 kg/m  Pulse: 53 (Regular)  P.OX: 98% (Room air) BP: 184/60 (Sitting, Left Arm, Standard)       Physical Exam Laverda Page, MD; 06/29/2016 12:44 PM) General Mental Status-Alert. General Appearance-Cooperative and Appears stated age. Build & Nutrition-Well built and  Well nourished.  Head and Neck Thyroid Gland Characteristics - normal size and consistency and no palpable nodules.  Chest and Lung Exam Chest and lung exam reveals -quiet, even and easy respiratory effort with no use of accessory muscles, non-tender and on auscultation, normal breath sounds, no adventitious sounds.  Cardiovascular Auscultation Rhythm - Regular. Heart Sounds - Normal heart sounds. Murmurs & Other Heart Sounds: Murmur: Murmur 2 - Location - Aortic Area and Apex. Timing - Early systolic. Grade - II/VI. Location - Aortic Area. Timing - Early diastolic. Grade - III/VI.  Abdomen Palpation/Percussion Normal exam - Non Tender and No hepatosplenomegaly.  Peripheral Vascular Lower Extremity Inspection - Bilateral - Loss of hair. Palpation - Temperature - Bilateral - Normal. Edema - Bilateral - No edema. Femoral pulse - Bilateral - 2+. Popliteal pulse - Bilateral - 2+. Dorsalis pedis pulse - Bilateral - Absent. Posterior tibial pulse - Left - Normal. Right - Absent. Carotid arteries - Bilateral-Soft Bruit.  Neurologic Neurologic evaluation reveals -alert and oriented x 3 with no impairment of recent or remote memory. Motor-Grossly intact without any focal deficits.  Musculoskeletal - Did not examine.   Results Laverda Page MD; 06/29/2016 12:43 PM) Procedures  Name Value Date Echocardiography, transthoracic, real-time with image documentation (2D), includes M-mode recording, when performed, complete, with spectral Doppler echocardiography, and with color flow Doppler echocardiography ([id number]) Comments: Echocardiogram 06/02/2016: Left ventricle cavity is normal in size. Moderate concentric hypertrophy of the left ventricle. Normal global wall motion. Doppler evidence of grade II (pseudonormal) diastolic dysfunction, elevated LAP. Calculated EF 70%. Left atrial cavity is mildly dilated at 4.4 cm. Mild calcification of the aortic valve annulus. Moderate aortic  valve leaflet calcification. Mildly restricted aortic valve leaflets. Mild aortic valve stenosis. Aortic valve peak pressure gradient of 35 and mean gradient of 16 mmHg, calculated aortic valve area 1.38 cm. Moderate (Grade III) aortic regurgitation. Mild tricuspid regurgitation. No evidence of pulmonary hypertension. No significant change in the study from 06/12/2014.  Performed: 06/04/2016 6:07 AM  Complete duplex scan of arteries of lower extremity ([id number]) Comments: Lower extremity arterial duplex 06/16/2016: No hemodynamically significant stenoses are identified in the right lower extremity arterial system. There is severe diffuse mixed plaque throughout. Moderate velocity increase at the left mid superficial femoral artery suggesting >50% stenosis. There is severe diffuse mixed plaque througout. This exam reveals severely decreased perfusion of the right lower extremity with RABI 0.46 and moderately decreased perfusion of the left lower extremity with LABI 0.69, noted at the post tibial artery level.  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 Antegrade vertebral artery flow. Follow up in 12 months is appropriate if clinically indicated.  Performed: 06/16/2016 10:59 PM    Assessment & Plan Laverda Page MD; 06/29/2016 12:43 PM) Aortic valve disorder (I35.9) Story: Echocardiogram 06/02/2016: Left ventricle cavity is normal in size. Moderate concentric hypertrophy of the left ventricle. Normal global wall motion. Doppler evidence of grade II (pseudonormal) diastolic dysfunction, elevated LAP. Calculated EF 70%. Left atrial cavity is mildly dilated at 4.4 cm. Mild calcification of the aortic valve annulus. Moderate aortic valve leaflet calcification. Mildly restricted aortic valve leaflets. Mild aortic valve stenosis. Aortic  valve peak pressure gradient of 35 and mean gradient of 16 mmHg, calculated aortic valve area 1.38 cm. Moderate (Grade III) aortic regurgitation. Mild tricuspid regurgitation. No evidence of pulmonary hypertension. No significant change in the study from 06/12/2014. Future Plans 07/17/2016: PT (PROTHROMBIN TIME) (46503) - one time Peripheral artery disease (I73.9) Story: Lower extremity arterial duplex 06/16/2016: No hemodynamically significant stenoses are identified in the right lower extremity arterial system. There is severe diffuse mixed plaque throughout. Moderate velocity increase at the left mid superficial femoral artery suggesting >50% stenosis. There is severe diffuse mixed plaque througout. This exam reveals severely decreased perfusion of the right lower extremity with RABI 0.46 and moderately decreased perfusion of the left lower extremity with LABI 0.69, noted at the post tibial artery level. Future Plans 54/65/6812: METABOLIC PANEL, BASIC (75170) - one time 07/17/2016: CBC & PLATELETS (AUTO) (01749) - one time Pure hypercholesterolemia (E78.00) Essential hypertension, benign (I10) Impression: EKG 05/12/2016: EKG 05/12/2016: Normal sinus rhythm at rate of 60 bpm, left atrial abnormality, left axis deviation, left anterior fascicular block. Right bundle branch block. LVH with repolarization abnormality, cannot exclude ischemia in the inferior and lateral wall. 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 Antegrade vertebral artery flow. Follow up in 12 months is appropriate if clinically indicated. Labwork Story: Labs 03/23/2016: Potassium 4.8, serum glucose 96 mg, BUN 23, serum creatinine 1.39, CMP otherwise normal. Total cholesterol 148, triglycerides 66, HDL 61, LDL 74.  A1c 5.8%.  Labs 02/05/2014: BUN 22, serum creatinine 1.28, eGFR 64 mL. Compared to prior BMP 6 months ago, EGFR is improved from 53 mL  BMP 10/02/2013: Serum glucose 100 mg, BUN 35, serum creatinine 1.5 one, eGFR 53 mL. Potassium was 4.5. CBC revealed a hemoglobin of 11.8 with a hematocrit of 36.4, normal indicis. No change from Labs done on 10/01/2011 Current Plans Mechanism of underlying disease process and action of medications discussed with the patient. I discussed primary/secondary prevention and also dietary counceling was done. Due to worsening symptoms of claudication markedly abnormal lower extremity arterial duplex, I have discussed with him  regarding continued medical therapy versus proceeding with angiography. Although the ABI is much reduced on the right than the left, the left skin shows evidence of thinning especially involving the left greater toe. Capillary refill is preserved in both the lower extremities. I suspect he may continue to develop worsening symptoms of claudication and/or severely of ischemia especially in view of high-grade stenosis in the left mid SFA. I'll set him up for peripheral arteriogram, will probably look at revascularization of the left first than the right lower extremity depending upon angiographic data. Contrast needs to be preserved and conserved due to renal insufficiency. He will be hydrated well prior to the procedure.  Needs to schedule for peripheral arteriogram and possible angioplasty given symptoms. Patient understands the risks, benefits, alternatives including medical therapy, CT angiography. Patient understands <1-2% risk of death, embolic complications, bleeding, infection, 2-3% risk of renal failure, urgent surgical revascularization, but not limited to these and wants to proceed. I will hold his losartan for 48 hours prior to the procedure. With regard to carotid artery stenosis, it is only moderate, I will continue to do bi-annual surveillance. I have  discussed regarding cardiac rehabilitation, patient states that his activities are limited and he has tried his best to walk on a daily basis and has been unsuccessful. Hence would like to proceed with peripheral arteriogram and possible angioplasty  CC: Dr. Hollace Kinnier.  Addendum Note(Jagadeesh Carlynn Herald MD; 07/23/2016 5:39 AM) Labs 07/21/2016: Pro time 9.9 seconds, normal. BUN 28, serum creatinine 1.37, eGFR 50 mL. Potassium 4.9. CBC normal, HB 13.7/HCT 40.1, platelets 240. Labs are stable to proceed with peripheral arteriogram.   Signed by Laverda Page, MD (06/29/2016 12:44 PM)

## 2016-07-28 ENCOUNTER — Encounter (HOSPITAL_COMMUNITY)
Admission: RE | Disposition: A | Discharge status: Home / Self Care | Payer: Self-pay | Source: Ambulatory Visit | Attending: Cardiology

## 2016-07-28 ENCOUNTER — Ambulatory Visit (HOSPITAL_COMMUNITY)
Admission: RE | Admit: 2016-07-28 | Discharge: 2016-07-28 | Disposition: A | Discharge status: Home / Self Care | Payer: Commercial Managed Care - HMO | Source: Ambulatory Visit | Attending: Cardiology | Admitting: Cardiology

## 2016-07-28 DIAGNOSIS — I351 Nonrheumatic aortic (valve) insufficiency: Secondary | ICD-10-CM | POA: Diagnosis not present

## 2016-07-28 DIAGNOSIS — I700 Atherosclerosis of aorta: Secondary | ICD-10-CM | POA: Insufficient documentation

## 2016-07-28 DIAGNOSIS — Z880 Allergy status to penicillin: Secondary | ICD-10-CM | POA: Insufficient documentation

## 2016-07-28 DIAGNOSIS — I69351 Hemiplegia and hemiparesis following cerebral infarction affecting right dominant side: Secondary | ICD-10-CM | POA: Insufficient documentation

## 2016-07-28 DIAGNOSIS — Z7902 Long term (current) use of antithrombotics/antiplatelets: Secondary | ICD-10-CM | POA: Diagnosis not present

## 2016-07-28 DIAGNOSIS — I70213 Atherosclerosis of native arteries of extremities with intermittent claudication, bilateral legs: Secondary | ICD-10-CM | POA: Insufficient documentation

## 2016-07-28 DIAGNOSIS — E7800 Pure hypercholesterolemia, unspecified: Secondary | ICD-10-CM | POA: Diagnosis not present

## 2016-07-28 DIAGNOSIS — N183 Chronic kidney disease, stage 3 (moderate): Secondary | ICD-10-CM | POA: Insufficient documentation

## 2016-07-28 DIAGNOSIS — I739 Peripheral vascular disease, unspecified: Secondary | ICD-10-CM | POA: Diagnosis present

## 2016-07-28 DIAGNOSIS — Z87891 Personal history of nicotine dependence: Secondary | ICD-10-CM | POA: Diagnosis not present

## 2016-07-28 DIAGNOSIS — Z801 Family history of malignant neoplasm of trachea, bronchus and lung: Secondary | ICD-10-CM | POA: Diagnosis not present

## 2016-07-28 DIAGNOSIS — E785 Hyperlipidemia, unspecified: Secondary | ICD-10-CM | POA: Diagnosis not present

## 2016-07-28 DIAGNOSIS — I129 Hypertensive chronic kidney disease with stage 1 through stage 4 chronic kidney disease, or unspecified chronic kidney disease: Secondary | ICD-10-CM | POA: Diagnosis not present

## 2016-07-28 DIAGNOSIS — M48062 Spinal stenosis, lumbar region with neurogenic claudication: Secondary | ICD-10-CM | POA: Diagnosis not present

## 2016-07-28 DIAGNOSIS — Z7982 Long term (current) use of aspirin: Secondary | ICD-10-CM | POA: Diagnosis not present

## 2016-07-28 HISTORY — PX: PERIPHERAL VASCULAR CATHETERIZATION: SHX172C

## 2016-07-28 SURGERY — LOWER EXTREMITY ANGIOGRAPHY
Anesthesia: LOCAL

## 2016-07-28 MED ORDER — ONDANSETRON HCL 4 MG/2ML IJ SOLN
4.0000 mg | Freq: Four times a day (QID) | INTRAMUSCULAR | Status: DC | PRN
Start: 2016-07-28 — End: 2016-07-30

## 2016-07-28 MED ORDER — SODIUM CHLORIDE 0.9% FLUSH: 3.0000 mL | Freq: Two times a day (BID) | INTRAVENOUS | Status: DC

## 2016-07-28 MED ORDER — GUAIFENESIN-DM 100-10 MG/5ML PO SYRP: 15.0000 mL | ORAL_SOLUTION | ORAL | Status: DC | PRN

## 2016-07-28 MED ORDER — LABETALOL HCL 5 MG/ML IV SOLN: 10.0000 mg | INTRAVENOUS | Status: DC | PRN

## 2016-07-28 MED ORDER — ACETAMINOPHEN 325 MG RE SUPP: 325.0000 mg | RECTAL | Status: DC | PRN

## 2016-07-28 MED ORDER — IODIXANOL 320 MG/ML IV SOLN
INTRAVENOUS | Status: DC | PRN
  Administered 2016-07-28: 105 mL via INTRA_ARTERIAL

## 2016-07-28 MED ORDER — MAGNESIUM SULFATE 2 GM/50ML IV SOLN: 2.0000 g | Freq: Every day | INTRAVENOUS | Status: DC | PRN

## 2016-07-28 MED ORDER — HEPARIN (PORCINE) IN NACL 2-0.9 UNIT/ML-% IJ SOLN
INTRAMUSCULAR | Status: DC | PRN
Start: 2016-07-28 — End: 2016-07-28
  Administered 2016-07-28: 1000 mL

## 2016-07-28 MED ORDER — POTASSIUM CHLORIDE CRYS ER 20 MEQ PO TBCR: 20.0000 meq | EXTENDED_RELEASE_TABLET | Freq: Every day | ORAL | Status: DC | PRN

## 2016-07-28 MED ORDER — SODIUM CHLORIDE 0.9 % IV SOLN: INTRAVENOUS | Status: DC

## 2016-07-28 MED ORDER — SODIUM CHLORIDE 0.9 % IV SOLN: 250.0000 mL | INTRAVENOUS | Status: DC | PRN

## 2016-07-28 MED ORDER — LIDOCAINE HCL (PF) 1 % IJ SOLN
INTRAMUSCULAR | Status: AC
  Filled 2016-07-28: qty 30

## 2016-07-28 MED ORDER — SODIUM CHLORIDE 0.9 % IV SOLN: 500.0000 mL | Freq: Once | INTRAVENOUS | Status: DC | PRN

## 2016-07-28 MED ORDER — HEPARIN (PORCINE) IN NACL 2-0.9 UNIT/ML-% IJ SOLN
INTRAMUSCULAR | Status: AC
  Filled 2016-07-28: qty 1000

## 2016-07-28 MED ORDER — FENTANYL CITRATE (PF) 100 MCG/2ML IJ SOLN
INTRAMUSCULAR | Status: DC | PRN
  Administered 2016-07-28: 25 ug via INTRAVENOUS
  Administered 2016-07-28: 50 ug via INTRAVENOUS

## 2016-07-28 MED ORDER — PHENOL 1.4 % MT LIQD: 1.0000 | OROMUCOSAL | Status: DC | PRN

## 2016-07-28 MED ORDER — MIDAZOLAM HCL 2 MG/2ML IJ SOLN
INTRAMUSCULAR | Status: AC
  Filled 2016-07-28: qty 2

## 2016-07-28 MED ORDER — SODIUM CHLORIDE 0.9 % IV BOLUS (SEPSIS)
500.0000 mL | Freq: Once | INTRAVENOUS | Status: AC
  Administered 2016-07-28: 1000 mL via INTRAVENOUS

## 2016-07-28 MED ORDER — MIDAZOLAM HCL 2 MG/2ML IJ SOLN
INTRAMUSCULAR | Status: DC | PRN
  Administered 2016-07-28: 2 mg via INTRAVENOUS

## 2016-07-28 MED ORDER — LOSARTAN POTASSIUM 50 MG PO TABS: 50.0000 mg | ORAL_TABLET | Freq: Every day | ORAL | 3 refills | Status: DC

## 2016-07-28 MED ORDER — LIDOCAINE HCL (PF) 1 % IJ SOLN
INTRAMUSCULAR | Status: DC | PRN
  Administered 2016-07-28: 15 mL

## 2016-07-28 MED ORDER — FENTANYL CITRATE (PF) 100 MCG/2ML IJ SOLN
INTRAMUSCULAR | Status: AC
  Filled 2016-07-28: qty 2

## 2016-07-28 MED ORDER — HYDRALAZINE HCL 20 MG/ML IJ SOLN: 5.0000 mg | INTRAMUSCULAR | Status: DC | PRN

## 2016-07-28 MED ORDER — ACETAMINOPHEN 325 MG PO TABS: 325.0000 mg | ORAL_TABLET | ORAL | Status: DC | PRN

## 2016-07-28 MED ORDER — DOCUSATE SODIUM 100 MG PO CAPS: 100.0000 mg | ORAL_CAPSULE | Freq: Every day | ORAL | Status: DC

## 2016-07-28 MED ORDER — ALUM & MAG HYDROXIDE-SIMETH 200-200-20 MG/5ML PO SUSP: 15.0000 mL | ORAL | Status: DC | PRN

## 2016-07-28 MED ORDER — PANTOPRAZOLE SODIUM 40 MG PO TBEC: 40.0000 mg | DELAYED_RELEASE_TABLET | Freq: Every day | ORAL | Status: DC

## 2016-07-28 MED ORDER — SODIUM CHLORIDE 0.9% FLUSH: 3.0000 mL | INTRAVENOUS | Status: DC | PRN

## 2016-07-28 MED ORDER — METOPROLOL TARTRATE 5 MG/5ML IV SOLN: 2.0000 mg | INTRAVENOUS | Status: DC | PRN

## 2016-07-28 SURGICAL SUPPLY — 10 items
CATH OMNI FLUSH 5F 65CM (CATHETERS) ×2 IMPLANT
CATH STRAIGHT 5FR 65CM (CATHETERS) ×2 IMPLANT
GUIDEWIRE ANGLED .035X150CM (WIRE) ×2 IMPLANT
KIT MICROINTRODUCER STIFF 5F (SHEATH) ×2 IMPLANT
KIT PV (KITS) ×2 IMPLANT
SHEATH PINNACLE 5F 10CM (SHEATH) ×2 IMPLANT
SYRINGE MEDRAD AVANTA MACH 7 (SYRINGE) ×2 IMPLANT
TRANSDUCER W/STOPCOCK (MISCELLANEOUS) ×2 IMPLANT
TRAY PV CATH (CUSTOM PROCEDURE TRAY) ×2 IMPLANT
WIRE HITORQ VERSACORE ST 145CM (WIRE) ×2 IMPLANT

## 2016-07-28 NOTE — Interval H&P Note (Signed)
History and Physical Interval Note:  07/28/2016 10:49 AM  [name]  has presented today for surgery, with the diagnosis of pvd  The various methods of treatment have been discussed with the patient and family. After consideration of risks, benefits and other options for treatment, the patient has consented to  Procedure(s): Lower Extremity Angiography (N/A) and possible angioplasty as a surgical intervention .  The patient's history has been reviewed, patient examined, no change in status, stable for surgery.  I have reviewed the patient's chart and labs.  Questions were answered to the patient's satisfaction.     Adrian Prows

## 2016-07-28 NOTE — Discharge Instructions (Signed)
Angiogram, Care After °These instructions give you information about caring for yourself after your procedure. Your doctor may also give you more specific instructions. Call your doctor if you have any problems or questions after your procedure. °Follow these instructions at home: °· Take medicines only as told by your doctor. °· Follow your doctor's instructions about: °¨ Care of the area where the tube was inserted. °¨ Bandage (dressing) changes and removal. °· You may shower 24-48 hours after the procedure or as told by your doctor. °· Do not take baths, swim, or use a hot tub until your doctor approves. °· Every day, check the area where the tube was inserted. Watch for: °¨ Redness, swelling, or pain. °¨ Fluid, blood, or pus. °· Do not apply powder or lotion to the site. °· Do not lift anything that is heavier than 10 lb (4.5 kg) for 5 days or as told by your doctor. °· Ask your doctor when you can: °¨ Return to work or school. °¨ Do physical activities or play sports. °¨ Have sex. °· Do not drive or operate heavy machinery for 24 hours or as told by your doctor. °· Have someone with you for the first 24 hours after the procedure. °· Keep all follow-up visits as told by your doctor. This is important. °Contact a health care provider if: °· You have a fever. °· You have chills. °· You have more bleeding from the area where the tube was inserted. Hold pressure on the area. °· You have redness, swelling, or pain in the area where the tube was inserted. °· You have fluid or pus coming from the area. °Get help right away if: °· You have a lot of pain in the area where the tube was inserted. °· The area where the tube was inserted is bleeding, and the bleeding does not stop after 30 minutes of holding steady pressure on the area. °· The area near or just beyond the insertion site becomes pale, cool, tingly, or numb. °This information is not intended to replace advice given to you by your health care provider. Make  sure you discuss any questions you have with your health care provider. °Document Released: 10/16/2008 Document Revised: 12/26/2015 Document Reviewed: 12/21/2012 °Elsevier Interactive Patient Education © 2017 [employer] ° °

## 2016-07-28 NOTE — Progress Notes (Signed)
Order for sheath removal verified per post procedural orders. Procedure explained to patient and RIGHT FEMORAL artery access site assessed: level 0, DOPPLERED dorsalis pedis and posterior tibial pulses. 5 [place] Sheath removed and manual pressure applied for 20 minutes. Pre, peri, & post procedural vitals: HR 50'S, RR 16 WITH SOME APNEA NOTED, O2 Sat 96%, BP 133/38, Pain level 0. Distal pulses remained intact after sheath removal. Access site level 0 and dressed with 4X4 gauze and tegaderm.  Luan Moore, RN, RN confirmed condition of site. Post procedural instructions discussed with return demonstration from patient.

## 2016-07-28 NOTE — Progress Notes (Signed)
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 No new orders

## 2016-07-29 ENCOUNTER — Encounter (HOSPITAL_COMMUNITY): Payer: Self-pay | Admitting: Cardiology

## 2016-08-10 ENCOUNTER — Encounter: Payer: Self-pay | Admitting: Internal Medicine

## 2016-08-12 ENCOUNTER — Encounter: Payer: Self-pay | Admitting: Internal Medicine

## 2016-09-16 ENCOUNTER — Ambulatory Visit (INDEPENDENT_AMBULATORY_CARE_PROVIDER_SITE_OTHER): Payer: Medicare PPO | Admitting: Ophthalmology

## 2016-09-16 DIAGNOSIS — I10 Essential (primary) hypertension: Secondary | ICD-10-CM | POA: Diagnosis not present

## 2016-09-16 DIAGNOSIS — H35033 Hypertensive retinopathy, bilateral: Secondary | ICD-10-CM | POA: Diagnosis not present

## 2016-09-16 DIAGNOSIS — H43811 Vitreous degeneration, right eye: Secondary | ICD-10-CM

## 2016-09-16 DIAGNOSIS — H2511 Age-related nuclear cataract, right eye: Secondary | ICD-10-CM | POA: Diagnosis not present

## 2016-09-16 DIAGNOSIS — H35342 Macular cyst, hole, or pseudohole, left eye: Secondary | ICD-10-CM | POA: Diagnosis not present

## 2016-09-16 DIAGNOSIS — H353131 Nonexudative age-related macular degeneration, bilateral, early dry stage: Secondary | ICD-10-CM

## 2016-10-13 ENCOUNTER — Encounter: Payer: Self-pay | Admitting: Internal Medicine

## 2016-11-02 ENCOUNTER — Encounter: Payer: Self-pay | Admitting: Internal Medicine

## 2016-11-02 ENCOUNTER — Ambulatory Visit (INDEPENDENT_AMBULATORY_CARE_PROVIDER_SITE_OTHER): Payer: Medicare PPO | Admitting: Internal Medicine

## 2016-11-02 VITALS — BP 148/60 | HR 60 | Temp 97.9°F | Wt 167.0 lb

## 2016-11-02 DIAGNOSIS — E7800 Pure hypercholesterolemia, unspecified: Secondary | ICD-10-CM | POA: Diagnosis not present

## 2016-11-02 DIAGNOSIS — C61 Malignant neoplasm of prostate: Secondary | ICD-10-CM | POA: Diagnosis not present

## 2016-11-02 DIAGNOSIS — I10 Essential (primary) hypertension: Secondary | ICD-10-CM | POA: Diagnosis not present

## 2016-11-02 DIAGNOSIS — I779 Disorder of arteries and arterioles, unspecified: Secondary | ICD-10-CM

## 2016-11-02 DIAGNOSIS — Z1211 Encounter for screening for malignant neoplasm of colon: Secondary | ICD-10-CM

## 2016-11-02 DIAGNOSIS — I739 Peripheral vascular disease, unspecified: Secondary | ICD-10-CM

## 2016-11-02 DIAGNOSIS — D631 Anemia in chronic kidney disease: Secondary | ICD-10-CM

## 2016-11-02 DIAGNOSIS — I63239 Cerebral infarction due to unspecified occlusion or stenosis of unspecified carotid arteries: Secondary | ICD-10-CM | POA: Diagnosis not present

## 2016-11-02 DIAGNOSIS — N183 Chronic kidney disease, stage 3 unspecified: Secondary | ICD-10-CM

## 2016-11-02 NOTE — Patient Instructions (Signed)

## 2016-11-02 NOTE — Progress Notes (Signed)
Location:  [facility] clinic Provider:  Ulrich Soules L. Mariea Clonts, D.O., C.M.D.  Code Status: DNR Goals of Care:  Advanced Directives 11/02/2016  Does Patient Have a Medical Advance Directive? No  Would patient like information on creating a medical advance directive? No - Patient declined     Chief Complaint  Patient presents with  . Medical Management of Chronic Issues    43mth follow-up    HPI: Patient is a 76 y.o. male seen today for medical management of chronic diseases.    Saw Dr. Einar Gip in December. He had planned to have stent placed, but according to patient, Dr. Einar Gip wasn't able to put a stent in. He is still having claudication in both legs after walking for a while. Both legs are the same. Thinks he has an appointment scheduled in a few months. Dr. Einar Gip recommended medical therapy, Pletal, stopped plavix, increased labetalol. Was going to see back in 6 months (seen on 1/3).   Has history of HLD, HTN, diastolic HF (Is on lasix, cozar, labetalol).   BP elevated today 148/60. He did not take his BP meds, b/c he was in a rush. He will take them when he gets back home. He doesn't eat salt or pork.   He does not want a colonoscopy. He does not want cologuard. We had registered him and he never followed through.  He says he will do the FOBT.  Prostate cancer- Saw Dr. Junious Silk 1/22, offered intervention, but patient didn't want to do anything about it. Therefore, watchful waiting.   Denies any problems or concerns. It is difficult to obtain history from patient.   Past Medical History:  Diagnosis Date  . Anemia   . Cataract    traumatic  . Chronic diastolic heart failure ([place])   . Chronic kidney disease, stage III (moderate)   . Depressive disorder, not elsewhere classified   . Depressive disorder, not elsewhere classified   . Elevated prostate specific antigen (PSA)   . Elevated prostate specific antigen (PSA)   . Gingivitis   . Hyperlipidemia   . Hypertension   . Hypertensive  kidney disease, benign   . 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 Microscopic hematuria   . Nondependent cannabis abuse   . 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 Peripheral vascular disease, unspecified   . Pure hypercholesterolemia   . Unspecified essential hypertension   . Unspecified late effects of cerebrovascular disease   . Unspecified late effects of cerebrovascular disease   . Unspecified vitamin D deficiency   . Unspecified vitamin D deficiency     Past Surgical History:  Procedure Laterality Date  . CATARACT EXTRACTION    . CORONARY STENT PLACEMENT     2008  . EYE SURGERY     left eye,    cornea repair  . PERIPHERAL VASCULAR CATHETERIZATION N/A 07/28/2016   Procedure: Lower Extremity Angiography;  Surgeon: Adrian Prows, MD;  Location: [place] [facility];  Service: Cardiovascular;  Laterality: N/A;    Allergies  Allergen Reactions  . Penicillins Other (See Comments)    Unknown  Has patient had a PCN reaction causing immediate rash, facial/tongue/throat swelling, SOB or lightheadedness with hypotension: {unknown Has patient had a PCN reaction causing severe rash involving mucus membranes or skin necrosis: unknown Has patient had a PCN  reaction that required hospitalization {unknown Has patient had a PCN reaction occurring within the last 10 years: no If all of the above answers are "NO", then may proceed with Cephalosporin use.    Allergies as of 11/02/2016      Reactions   Penicillins Other (See Comments)   Unknown Has patient had a PCN reaction causing immediate rash, facial/tongue/throat swelling, SOB or lightheadedness with hypotension: {unknown Has patient had a PCN reaction causing severe rash involving mucus membranes or skin necrosis: unknown Has patient had a  PCN reaction that required hospitalization {unknown Has patient had a PCN reaction occurring within the last 10 years: no If all of the above answers are "NO", then may proceed with Cephalosporin use.      Medication List       Accurate as of 11/02/16  8:34 AM. Always use your most recent med list.          amLODipine 10 MG tablet Commonly known as:  NORVASC TAKE 1 TABLET ONCE DAILY FOR BLOOD PRESSURE   aspirin 81 MG tablet Take 81 mg by mouth daily.   CALTRATE 600+D 600-800 MG-UNIT Tabs Generic drug:  Calcium Carb-Cholecalciferol TAKE 1 TABLET TWICE DAILY   cilostazol 100 MG tablet Commonly known as:  PLETAL Take 100 mg by mouth 2 (two) times daily.   doxazosin 4 MG tablet Commonly known as:  CARDURA TAKE 1 TABLET ONE TIME DAILY FOR BLOOD PRESSURE   furosemide 20 MG tablet Commonly known as:  LASIX TAKE ONE TABLET EVERY MORNING AND TAKE ONE TABLET EVERY EVENING   labetalol 300 MG tablet Commonly known as:  NORMODYNE Take 600 mg by mouth 3 (three) times daily.   latanoprost 0.005 % ophthalmic solution Commonly known as:  XALATAN Place 1 drop into both eyes at bedtime.   losartan 50 MG tablet Commonly known as:  COZAAR Take 1 tablet (50 mg total) by mouth daily.   pravastatin 80 MG tablet Commonly known as:  PRAVACHOL TAKE 1 TABLET ONE TIME DAILY TO CONTROL CHOLESTEROL       Review of Systems:  Review of Systems  Constitutional: Negative for chills, fever and malaise/fatigue.  HENT: Negative for hearing loss.   Eyes: Negative for blurred vision.  Respiratory: Negative for cough and shortness of breath.   Cardiovascular: Negative for chest pain, palpitations and leg swelling.       Claudication when walking, relieved at rest, known 1-49% blockage in legs on pletal  thru cardiology  Gastrointestinal: Negative for abdominal pain, blood in stool, constipation and melena.  Genitourinary: Negative for dysuria, frequency and urgency.  Musculoskeletal: Negative  for falls.  Skin: Negative for itching and rash.  Neurological: Positive for tingling and sensory change. Negative for dizziness, loss of consciousness and weakness.  Endo/Heme/Allergies: Does not bruise/bleed easily.  Psychiatric/Behavioral: Negative for depression and memory loss.    Health Maintenance  Topic Date Due  . TETANUS/TDAP  03/14/1960  . COLON CANCER SCREENING ANNUAL FOBT  11/30/2016 (Originally 03/15/1991)  . INFLUENZA VACCINE  03/03/2017  . PNA vac Low Risk Adult  Completed    Physical Exam: Vitals:   11/02/16 0827  BP: (!) 148/60  Pulse: 60  Temp: 97.9 F (36.6 C)  TempSrc: Oral  SpO2: 98%  Weight: 167 lb (75.8 kg)   Body mass index is 24.66 kg/m. Physical Exam  Constitutional: He is oriented to person, place, and time. He appears well-developed. No distress.  Cardiovascular: Normal rate and regular rhythm.   Murmur heard. 3/6  systolic murmur loudest at 2nd ICS radiating throughout precordium; pulses not palpable long term, feet cool, but not discolored or painful at rest  Pulmonary/Chest: Effort normal and breath sounds normal. No respiratory distress.  Abdominal: Bowel sounds are normal.  Musculoskeletal: Normal range of motion.  Neurological: He is alert and oriented to person, place, and time.  Skin: Skin is warm and dry.  Psychiatric:  Flat affect    Labs reviewed: Basic Metabolic Panel:  Recent Labs  03/23/16 0820 06/29/16 0916  NA 138 140  K 4.8 4.7  CL 107 110  CO2 25 25  GLUCOSE 96 96  BUN 23 27*  CREATININE 1.39* 1.23*  CALCIUM 9.3 9.1   Liver Function Tests:  Recent Labs  03/23/16 0820 06/29/16 0916  AST 22 25  ALT 12 16  ALKPHOS 51 57  BILITOT 0.3 0.2  PROT 7.0 6.6  ALBUMIN 4.1 3.9   No results for input(s): LIPASE, AMYLASE in the last 8760 hours. No results for input(s): AMMONIA in the last 8760 hours. CBC:  Recent Labs  03/23/16 0820 06/29/16 0916  WBC 4.4 5.1  NEUTROABS 2,244 2,958  HGB 11.2* 12.3*  HCT  35.7* 38.6  MCV 87.3 91.3  PLT 285 241   Lipid Panel:  Recent Labs  03/23/16 0820  CHOL 148  HDL 61  LDLCALC 74  TRIG 66  CHOLHDL 2.4   Lab Results  Component Value Date   HGBA1C 5.7 (H) 06/29/2016     Assessment/Plan 1. Claudication of lower extremity (HCC) -cont pletal and adjusted labetalol, baby asa -advised about when it's an emergency for him to go to the ED about limb ischemia -following with Dr. Einar Gip  2. 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 Carotid stenosis, symptomatic, with infarction ([place]) -prior stroke, persistent carotid disease, cont pletal, asa, bp control, lipid control  4. Chronic kidney disease, stage III (moderate) -has been stable lately, cont to monitor, avoid nephrotoxic meds  5. Prostate cancer ([place]) -cont cardura for symptoms, watchful waiting with urology (MRIs, biopsy)   6. Anemia in stage 3 chronic kidney disease -cont to monitor, on pletal and asa  7. Pure hypercholesterolemia -last LDL calc was 75 pretty decent considering nonadherence overall -cont pravachol 80mg  due to affordability of drug  8. Essential hypertension -bp not at goal due to not taking meds yet this morning due to rushing here -cont labetalol 600mg  po tid, cozaar 50mg  po daily, lasix 20mg  daily, amlodipine 10mg  po daily  9.  Colon cancer -FOBT  Labs/tests ordered:  FOBT Next appt:  03/11/2017   Otillia Cordone L. Lavonda Thal, D.O. [facility] [address], [place] [address] Cell Phone (Mon-Fri 8am-5pm):  [phone] On Call:  [phone] & follow prompts after 5pm & weekends Office Phone:  [phone] Office Fax:  [phone]

## 2016-12-23 ENCOUNTER — Encounter: Payer: Self-pay | Admitting: Internal Medicine

## 2017-01-05 ENCOUNTER — Encounter: Payer: Self-pay | Admitting: Internal Medicine

## 2017-02-04 ENCOUNTER — Ambulatory Visit: Payer: Self-pay | Admitting: Internal Medicine

## 2017-02-17 ENCOUNTER — Encounter: Payer: Self-pay | Admitting: Internal Medicine

## 2017-02-22 ENCOUNTER — Telehealth: Payer: Self-pay

## 2017-02-22 ENCOUNTER — Encounter: Payer: Self-pay | Admitting: Internal Medicine

## 2017-02-22 ENCOUNTER — Ambulatory Visit: Payer: Self-pay | Admitting: Internal Medicine

## 2017-02-22 NOTE — Telephone Encounter (Signed)
Left message on voicemail for patient to return call when available , Reason for call: Inquire about missed appointment today with Dr.Reed @ 8:30 pm

## 2017-03-01 NOTE — Telephone Encounter (Signed)
Pending appointment 03/11/17

## 2017-03-10 ENCOUNTER — Ambulatory Visit (INDEPENDENT_AMBULATORY_CARE_PROVIDER_SITE_OTHER): Payer: Medicare PPO

## 2017-03-10 VITALS — BP 180/58 | HR 54 | Temp 97.4°F | Ht 69.0 in | Wt 164.8 lb

## 2017-03-10 DIAGNOSIS — I10 Essential (primary) hypertension: Secondary | ICD-10-CM | POA: Diagnosis not present

## 2017-03-10 DIAGNOSIS — Z0000 Encounter for general adult medical examination without abnormal findings: Secondary | ICD-10-CM | POA: Diagnosis not present

## 2017-03-10 NOTE — Progress Notes (Signed)
Subjective:   [name] is a 76 y.o. male who presents for Medicare Annual/Subsequent preventive examination     Last AWV-03/26/16  Objective:    Vitals: BP (!) 180/58 (BP Location: Left Arm, Patient Position: Sitting)   Pulse (!) 54   Temp (!) 97.4 F (36.3 C) (Oral)   Ht 5\' 9"  (1.753 m)   Wt 164 lb 12.8 oz (74.8 kg)   SpO2 98%   BMI 24.34 kg/m   Body mass index is 24.34 kg/m.  Tobacco History  Smoking Status  . Former Smoker  . Packs/day: 0.50  . Years: 13.00  . Quit date: 03/09/1971  Smokeless Tobacco  . Never Used     Counseling given: Not Answered   Past Medical History:  Diagnosis Date  . Anemia   . Cataract    traumatic  . Chronic diastolic heart failure ([place])   . Chronic kidney disease, stage III (moderate)   . Depressive disorder, not elsewhere classified   . Depressive disorder, not elsewhere classified   . Elevated prostate specific antigen (PSA)   . Elevated prostate specific antigen (PSA)   . Gingivitis   . Hyperlipidemia   . Hypertension   . Hypertensive kidney disease, benign   . 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 Microscopic hematuria   . Nondependent cannabis abuse   . 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 Peripheral vascular disease, unspecified ([place])   . Pure hypercholesterolemia   . Unspecified essential hypertension   . Unspecified late effects of cerebrovascular disease   . Unspecified late effects of cerebrovascular disease   . Unspecified vitamin D deficiency   . Unspecified vitamin D deficiency    Past Surgical History:  Procedure Laterality Date  . CATARACT EXTRACTION    . CORONARY STENT PLACEMENT     2008  . EYE SURGERY     left eye,    cornea repair  . PERIPHERAL VASCULAR CATHETERIZATION N/A 07/28/2016   Procedure: Lower Extremity Angiography;  Surgeon: Adrian Prows, MD;  Location: [facility];  Service: Cardiovascular;  Laterality: N/A;   Family History  Problem Relation Age of Onset  . Cancer Father    History  Sexual Activity  . Sexual activity: Not on file    Outpatient Encounter Prescriptions as of 03/10/2017  Medication Sig  . amLODipine (NORVASC) 10 MG tablet TAKE 1 TABLET ONCE DAILY FOR BLOOD PRESSURE  . aspirin 81 MG tablet Take 81 mg by mouth daily.    Marland Kitchen CALTRATE 600+D 600-800 MG-UNIT TABS TAKE 1 TABLET TWICE DAILY  . cilostazol (PLETAL) 100 MG tablet Take 100 mg by mouth 2 (two) times daily.  Marland Kitchen doxazosin (CARDURA) 4 MG tablet TAKE 1 TABLET ONE TIME DAILY FOR BLOOD PRESSURE  . furosemide (LASIX) 20 MG tablet TAKE ONE TABLET EVERY MORNING AND TAKE ONE TABLET EVERY EVENING  . labetalol (NORMODYNE) 300 MG tablet Take 600 mg by mouth 3 (three) times daily.  Marland Kitchen latanoprost (XALATAN) 0.005 % ophthalmic solution Place 1 drop into both eyes at bedtime.   Marland Kitchen losartan (COZAAR) 50 MG tablet Take 1 tablet (50 mg total) by mouth daily.  . pravastatin (PRAVACHOL) 80 MG tablet TAKE 1 TABLET ONE TIME DAILY TO CONTROL CHOLESTEROL   No facility-administered encounter medications on file as of 03/10/2017.  Activities of Daily Living In your present state of health, do you have any difficulty performing the following activities: 03/10/2017 07/28/2016  Hearing? N N  Vision? N N  Difficulty concentrating or making decisions? Y N  Walking or climbing stairs? Y Y  Comment Legs feel tired -  Dressing or bathing? N N  Doing errands, shopping? N -  Preparing Food and eating ? N -  Using the Toilet? N -  In the past six months, have you accidently leaked urine? N -  Do you have problems with loss of bowel control? N -  Managing your Medications? N -  Managing your Finances? N -  Housekeeping or managing your Housekeeping? N -  Some recent data might be hidden    Patient Care Team: Gayland Curry, DO as PCP - General (Geriatric Medicine) Adrian Prows, MD as Consulting Physician (Cardiology) Clent Jacks, MD as Consulting Physician (Ophthalmology) Mal Misty, MD as Consulting Physician (Vascular Surgery) Garvin Fila, MD as Consulting Physician (Neurology) Hayden Pedro, MD as Consulting Physician (Ophthalmology)   Assessment:     Exercise Activities and Dietary recommendations Current Exercise Habits: Home exercise routine, Type of exercise: strength training/weights, Time (Minutes): 10, Frequency (Times/Week): 4, Weekly Exercise (Minutes/Week): 40, Intensity: Mild, Exercise limited by: None identified  Goals    . Maintain lifestyle          Patient will maintain Lifestyle      Fall Risk Fall Risk  03/10/2017 11/02/2016 06/29/2016 03/26/2016 12/27/2015  Falls in the past year? No No No No No   Depression Screen PHQ 2/9 Scores 03/10/2017 11/02/2016 06/29/2016 03/26/2016  PHQ - 2 Score 0 0 0 0    Cognitive Function MMSE - Mini Mental State Exam 03/10/2017 03/26/2016  Orientation to time 5 5  Orientation to Place 5 5  Registration 3 3  Attention/ Calculation 5 5  Recall 2 2  Language- name 2 objects 2 2  Language- repeat 1 1  Language- follow 3 step command 3 2  Language- read & follow direction 1 1  Write a sentence 1 1  Copy design 1 1  Total score 29 28        Immunization History  Administered Date(s) Administered  . Influenza Split 04/29/2010  . Influenza,inj,Quad PF,36+ Mos 06/05/2013, 07/09/2014, 06/14/2015  . Pneumococcal Conjugate-13 10/02/2013  . Pneumococcal Polysaccharide-23 11/05/2011   Screening Tests Health Maintenance  Topic Date Due  . TETANUS/TDAP  03/14/1960  . COLON CANCER SCREENING ANNUAL FOBT  03/15/1991  . INFLUENZA VACCINE  03/03/2017  . PNA vac Low Risk Adult  Completed      Plan:    I have personally reviewed and addressed the Medicare Annual Wellness questionnaire and have noted the following in the patient's chart:    A. Medical and social history B. Use of alcohol, tobacco or illicit drugs  C. Current medications and supplements D. Functional ability and status E.  Nutritional status F.  Physical activity G. Advance directives H. List of other physicians I.  Hospitalizations, surgeries, and ER visits in previous 12 months J.  [place] to include hearing, vision, cognitive, depression L. Referrals and appointments - none  In addition, I have reviewed and discussed with patient certain preventive protocols, quality metrics, and best practice recommendations. A written personalized care plan for preventive services as well as general preventive health recommendations were provided to patient.  See attached scanned questionnaire for additional information.   Signed,   Clarise Cruz  [place], English as a second language teacher Health Advisor   Quick Notes   Health Maintenance: TDAP and colonoscopy due and declined.     Abnormal Screen: 1st BP: 180/58 2nd BP: 180/58  MMSE 29/30. Did not pass clock drawing     Patient Concerns: None     Nurse Concerns: BP. Pt is currently eating 1 meal a day at the motel he is living at. Explained DASH diet and will put in referral for careguide to find other options for him.

## 2017-03-10 NOTE — Patient Instructions (Signed)
[name] , Thank you for taking time to come for your Medicare Wellness Visit. I appreciate your ongoing commitment to your health goals. Please review the following plan we discussed and let me know if I can assist you in the future.   Screening recommendations/referrals: Colonoscopy due, declined Recommended yearly ophthalmology/optometry visit for glaucoma screening and checkup Recommended yearly dental visit for hygiene and checkup  Vaccinations: Influenza vaccine due 2018 fall season Pneumococcal vaccine up to date Tdap vaccine due, declined Shingles vaccine due, declined  Advanced directives: Advance directive discussed with you today. I have provided a copy for you to complete at home and have notarized. Once this is complete please bring a copy in to our office so we can scan it into your chart.   Conditions/risks identified: BP high  Next appointment: Dr. Mariea Clonts 03/11/17 @ 9am  Preventive Care 76 Years and Older, Male Preventive care refers to lifestyle choices and visits with your health care provider that can promote health and wellness. What does preventive care include?  A yearly physical exam. This is also called an annual well check.  Dental exams once or twice a year.  Routine eye exams. Ask your health care provider how often you should have your eyes checked.  Personal lifestyle choices, including:  Daily care of your teeth and gums.  Regular physical activity.  Eating a healthy diet.  Avoiding tobacco and drug use.  Limiting alcohol use.  Practicing safe sex.  Taking low doses of aspirin every day.  Taking vitamin and mineral supplements as recommended by your health care provider. What happens during an annual well check? The services and screenings done by your health care provider during your annual well check will depend on your age, overall health, lifestyle risk factors, and family history of disease. Counseling  Your health care provider may  ask you questions about your:  Alcohol use.  Tobacco use.  Drug use.  Emotional well-being.  Home and relationship well-being.  Sexual activity.  Eating habits.  History of falls.  Memory and ability to understand (cognition).  Work and work Statistician. Screening  You may have the following tests or measurements:  Height, weight, and BMI.  Blood pressure.  Lipid and cholesterol levels. These may be checked every 5 years, or more frequently if you are over 70 years old.  Skin check.  Lung cancer screening. You may have this screening every year starting at age 76 if you have a 30-pack-year history of smoking and currently smoke or have quit within the past 15 years.  Fecal occult blood test (FOBT) of the stool. You may have this test every year starting at age 40.  Flexible sigmoidoscopy or colonoscopy. You may have a sigmoidoscopy every 5 years or a colonoscopy every 10 years starting at age 76.  Prostate cancer screening. Recommendations will vary depending on your family history and other risks.  Hepatitis C blood test.  Hepatitis B blood test.  Sexually transmitted disease (STD) testing.  Diabetes screening. This is done by checking your blood sugar (glucose) after you have not eaten for a while (fasting). You may have this done every 1-3 years.  Abdominal aortic aneurysm (AAA) screening. You may need this if you are a current or former smoker.  Osteoporosis. You may be screened starting at age 22 if you are at high risk. Talk with your health care provider about your test results, treatment options, and if necessary, the need for more tests. Vaccines  Your health care  provider may recommend certain vaccines, such as:  Influenza vaccine. This is recommended every year.  Tetanus, diphtheria, and acellular pertussis (Tdap, Td) vaccine. You may need a Td booster every 10 years.  Zoster vaccine. You may need this after age 76.  Pneumococcal 13-valent  conjugate (PCV13) vaccine. One dose is recommended after age 76.  Pneumococcal polysaccharide (PPSV23) vaccine. One dose is recommended after age 76. Talk to your health care provider about which screenings and vaccines you need and how often you need them. This information is not intended to replace advice given to you by your health care provider. Make sure you discuss any questions you have with your health care provider. Document Released: 08/16/2015 Document Revised: 04/08/2016 Document Reviewed: 05/21/2015 Elsevier Interactive Patient Education  2017 [place] Prevention in the Home Falls can cause injuries. They can happen to people of all ages. There are many things you can do to make your home safe and to help prevent falls. What can I do on the outside of my home?  Regularly fix the edges of walkways and driveways and fix any cracks.  Remove anything that might make you trip as you walk through a door, such as a raised step or threshold.  Trim any bushes or trees on the path to your home.  Use bright outdoor lighting.  Clear any walking paths of anything that might make someone trip, such as rocks or tools.  Regularly check to see if handrails are loose or broken. Make sure that both sides of any steps have handrails.  Any raised decks and porches should have guardrails on the edges.  Have any leaves, snow, or ice cleared regularly.  Use sand or salt on walking paths during winter.  Clean up any spills in your garage right away. This includes oil or grease spills. What can I do in the bathroom?  Use night lights.  Install grab bars by the toilet and in the tub and shower. Do not use towel bars as grab bars.  Use non-skid mats or decals in the tub or shower.  If you need to sit down in the shower, use a plastic, non-slip stool.  Keep the floor dry. Clean up any water that spills on the floor as soon as it happens.  Remove soap buildup in the tub or  shower regularly.  Attach bath mats securely with double-sided non-slip rug tape.  Do not have throw rugs and other things on the floor that can make you trip. What can I do in the bedroom?  Use night lights.  Make sure that you have a light by your bed that is easy to reach.  Do not use any sheets or blankets that are too big for your bed. They should not hang down onto the floor.  Have a firm chair that has side arms. You can use this for support while you get dressed.  Do not have throw rugs and other things on the floor that can make you trip. What can I do in the kitchen?  Clean up any spills right away.  Avoid walking on wet floors.  Keep items that you use a lot in easy-to-reach places.  If you need to reach something above you, use a strong step stool that has a grab bar.  Keep electrical cords out of the way.  Do not use floor polish or wax that makes floors slippery. If you must use wax, use non-skid floor wax.  Do not have throw  rugs and other things on the floor that can make you trip. What can I do with my stairs?  Do not leave any items on the stairs.  Make sure that there are handrails on both sides of the stairs and use them. Fix handrails that are broken or loose. Make sure that handrails are as long as the stairways.  Check any carpeting to make sure that it is firmly attached to the stairs. Fix any carpet that is loose or worn.  Avoid having throw rugs at the top or bottom of the stairs. If you do have throw rugs, attach them to the floor with carpet tape.  Make sure that you have a light switch at the top of the stairs and the bottom of the stairs. If you do not have them, ask someone to add them for you. What else can I do to help prevent falls?  Wear shoes that:  Do not have high heels.  Have rubber bottoms.  Are comfortable and fit you well.  Are closed at the toe. Do not wear sandals.  If you use a stepladder:  Make sure that it is fully  opened. Do not climb a closed stepladder.  Make sure that both sides of the stepladder are locked into place.  Ask someone to hold it for you, if possible.  Clearly mark and make sure that you can see:  Any grab bars or handrails.  First and last steps.  Where the edge of each step is.  Use tools that help you move around (mobility aids) if they are needed. These include:  Canes.  Walkers.  Scooters.  Crutches.  Turn on the lights when you go into a dark area. Replace any light bulbs as soon as they burn out.  Set up your furniture so you have a clear path. Avoid moving your furniture around.  If any of your floors are uneven, fix them.  If there are any pets around you, be aware of where they are.  Review your medicines with your doctor. Some medicines can make you feel dizzy. This can increase your chance of falling. Ask your doctor what other things that you can do to help prevent falls. This information is not intended to replace advice given to you by your health care provider. Make sure you discuss any questions you have with your health care provider. Document Released: 05/16/2009 Document Revised: 12/26/2015 Document Reviewed: 08/24/2014 Elsevier Interactive Patient Education  2017 [employer].

## 2017-03-11 ENCOUNTER — Ambulatory Visit: Payer: Self-pay

## 2017-03-11 ENCOUNTER — Encounter: Payer: Self-pay | Admitting: Internal Medicine

## 2017-03-11 ENCOUNTER — Ambulatory Visit (INDEPENDENT_AMBULATORY_CARE_PROVIDER_SITE_OTHER): Payer: Medicare PPO | Admitting: Internal Medicine

## 2017-03-11 VITALS — BP 138/60 | HR 51 | Temp 97.6°F | Ht 69.0 in | Wt 164.0 lb

## 2017-03-11 DIAGNOSIS — I779 Disorder of arteries and arterioles, unspecified: Secondary | ICD-10-CM

## 2017-03-11 DIAGNOSIS — I10 Essential (primary) hypertension: Secondary | ICD-10-CM

## 2017-03-11 DIAGNOSIS — I5032 Chronic diastolic (congestive) heart failure: Secondary | ICD-10-CM | POA: Diagnosis not present

## 2017-03-11 DIAGNOSIS — C61 Malignant neoplasm of prostate: Secondary | ICD-10-CM

## 2017-03-11 DIAGNOSIS — D631 Anemia in chronic kidney disease: Secondary | ICD-10-CM | POA: Diagnosis not present

## 2017-03-11 DIAGNOSIS — N183 Chronic kidney disease, stage 3 unspecified: Secondary | ICD-10-CM

## 2017-03-11 DIAGNOSIS — I350 Nonrheumatic aortic (valve) stenosis: Secondary | ICD-10-CM

## 2017-03-11 DIAGNOSIS — E7800 Pure hypercholesterolemia, unspecified: Secondary | ICD-10-CM

## 2017-03-11 NOTE — Progress Notes (Signed)
Location:  [facility] clinic Provider:  Raelynn Corron L. Mariea Clonts, D.O., C.M.D.  Code Status: full code Goals of Care:  Advanced Directives 03/10/2017  Does Patient Have a Medical Advance Directive? No  Would patient like information on creating a medical advance directive? Yes (MAU/Ambulatory/Procedural Areas - Information given)   Chief Complaint  Patient presents with  . Medical Management of Chronic Issues    follow-up    HPI: Patient is a 76 y.o. male seen today for medical management of chronic diseases.  He sees Dr. Einar Gip from cardiology and Dr. Junious Silk from urology.    PAD/carotid disease:  Follows with Dr. Einar Gip for all of his screening and mgt of these conditions.  Carotid stenosis minimal.  Echo was being done for his aortic stenosis. His bp had been very high there and his medication was changed from losartan to valsartan/hctz and we updated his med list.  He has severe PAD, but medical therapy was recommended except with acute limb ischemia if it should arise.    HTN:  BP is actually under 140/90 today.    MMSE was 29/30 in annual wellness visit yesterday.  No longer smokes cigarettes.    Colon cancer screening:  He agreed to cologuard more than once, but then never did it.  We then gave him the FOBT kit and he did not do it.  He declines this now and this was also reviewed by wellness nurse.  He refuses tdap and shingrix at the pharmacy.  He reported to Clarise Cruz, Therapist, sports, that he is living in a motel and eating one meal per day.  She referred him to the [facility] careguide to assist with his housing and nutrition situation.    Prostate cancer:  Has had very high PSA and urology continues to watch and wait and pt refuses further biopsies and testing.  Sees Dr. Junious Silk.  Past Medical History:  Diagnosis Date  . Anemia   . Cataract    traumatic  . Chronic diastolic heart failure ([place])   . Chronic kidney disease, stage III (moderate)   . Depressive disorder, not elsewhere classified   .  Depressive disorder, not elsewhere classified   . Elevated prostate specific antigen (PSA)   . Elevated prostate specific antigen (PSA)   . Gingivitis   . Hyperlipidemia   . Hypertension   . Hypertensive kidney disease, benign   . 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 Microscopic hematuria   . Nondependent cannabis abuse   . 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 Peripheral vascular disease, unspecified ([place])   . Pure hypercholesterolemia   . Unspecified essential hypertension   . Unspecified late effects of cerebrovascular disease   . Unspecified late effects of cerebrovascular disease   . Unspecified vitamin D deficiency   . Unspecified vitamin D deficiency     Past Surgical History:  Procedure Laterality Date  . CATARACT EXTRACTION    . CORONARY STENT PLACEMENT     2008  . EYE SURGERY     left eye,    cornea repair  . PERIPHERAL VASCULAR CATHETERIZATION N/A 07/28/2016   Procedure: Lower Extremity Angiography;  Surgeon: Adrian Prows, MD;  Location: [facility];  Service: Cardiovascular;  Laterality: N/A;    Allergies  Allergen Reactions  . Penicillins Other (See Comments)  Unknown  Has patient had a PCN reaction causing immediate rash, facial/tongue/throat swelling, SOB or lightheadedness with hypotension: {unknown Has patient had a PCN reaction causing severe rash involving mucus membranes or skin necrosis: unknown Has patient had a PCN reaction that required hospitalization {unknown Has patient had a PCN reaction occurring within the last 10 years: no If all of the above answers are "NO", then may proceed with Cephalosporin use.    Allergies as of 03/11/2017      Reactions   Penicillins Other (See Comments)   Unknown Has patient had a PCN reaction causing  immediate rash, facial/tongue/throat swelling, SOB or lightheadedness with hypotension: {unknown Has patient had a PCN reaction causing severe rash involving mucus membranes or skin necrosis: unknown Has patient had a PCN reaction that required hospitalization {unknown Has patient had a PCN reaction occurring within the last 10 years: no If all of the above answers are "NO", then may proceed with Cephalosporin use.      Medication List       Accurate as of 03/11/17  9:33 AM. Always use your most recent med list.          amLODipine 10 MG tablet Commonly known as:  NORVASC TAKE 1 TABLET ONCE DAILY FOR BLOOD PRESSURE   aspirin 81 MG tablet Take 81 mg by mouth daily.   CALTRATE 600+D 600-800 MG-UNIT Tabs Generic drug:  Calcium Carb-Cholecalciferol TAKE 1 TABLET TWICE DAILY   cilostazol 100 MG tablet Commonly known as:  PLETAL Take 100 mg by mouth 2 (two) times daily.   doxazosin 4 MG tablet Commonly known as:  CARDURA TAKE 1 TABLET ONE TIME DAILY FOR BLOOD PRESSURE   furosemide 20 MG tablet Commonly known as:  LASIX TAKE ONE TABLET EVERY MORNING AND TAKE ONE TABLET EVERY EVENING   labetalol 300 MG tablet Commonly known as:  NORMODYNE Take 600 mg by mouth 3 (three) times daily.   latanoprost 0.005 % ophthalmic solution Commonly known as:  XALATAN Place 1 drop into both eyes at bedtime.   losartan 50 MG tablet Commonly known as:  COZAAR Take 1 tablet (50 mg total) by mouth daily.   pravastatin 80 MG tablet Commonly known as:  PRAVACHOL TAKE 1 TABLET ONE TIME DAILY TO CONTROL CHOLESTEROL       Review of Systems:  Review of Systems  Constitutional: Negative for chills, fever and malaise/fatigue.  HENT: Negative for congestion and hearing loss.   Eyes: Negative for blurred vision.  Respiratory: Negative for shortness of breath.   Cardiovascular: Negative for chest pain and palpitations.  Gastrointestinal: Negative for abdominal pain, blood in stool, constipation  and melena.  Genitourinary: Negative for dysuria.  Musculoskeletal: Positive for myalgias. Negative for falls.       In legs when walking  Skin: Negative for itching and rash.  Neurological: Positive for tingling and sensory change. Negative for dizziness, loss of consciousness and weakness.  Psychiatric/Behavioral: Negative for depression and memory loss.    Health Maintenance  Topic Date Due  . TETANUS/TDAP  03/14/1960  . COLON CANCER SCREENING ANNUAL FOBT  03/15/1991  . INFLUENZA VACCINE  03/03/2017  . PNA vac Low Risk Adult  Completed    Physical Exam: Vitals:   03/11/17 0911  BP: 138/60  Pulse: (!) 51  Temp: 97.6 F (36.4 C)  TempSrc: Oral  SpO2: 98%  Weight: 164 lb (74.4 kg)  Height: '5\' 9"'$  (1.753 m)   Body mass index is 24.22 kg/m. Physical Exam  Constitutional: He  is oriented to person, place, and time. He appears well-developed and well-nourished. No distress.  Cardiovascular: Normal rate and regular rhythm.   Murmur heard. Second ICS on right radiating throughout precordium  Pulmonary/Chest: Effort normal and breath sounds normal.  Abdominal: Bowel sounds are normal.  Musculoskeletal: Normal range of motion.  Neurological: He is alert and oriented to person, place, and time.  Skin: Skin is warm and dry. Capillary refill takes less than 2 seconds.  Psychiatric:  Chronic flat affect    Labs reviewed: Basic Metabolic Panel:  Recent Labs  03/23/16 0820 06/29/16 0916  NA 138 140  K 4.8 4.7  CL 107 110  CO2 25 25  GLUCOSE 96 96  BUN 23 27*  CREATININE 1.39* 1.23*  CALCIUM 9.3 9.1   Liver Function Tests:  Recent Labs  03/23/16 0820 06/29/16 0916  AST 22 25  ALT 12 16  ALKPHOS 51 57  BILITOT 0.3 0.2  PROT 7.0 6.6  ALBUMIN 4.1 3.9   No results for input(s): LIPASE, AMYLASE in the last 8760 hours. No results for input(s): AMMONIA in the last 8760 hours. CBC:  Recent Labs  03/23/16 0820 06/29/16 0916  WBC 4.4 5.1  NEUTROABS 2,244 2,958    HGB 11.2* 12.3*  HCT 35.7* 38.6  MCV 87.3 91.3  PLT 285 241   Lipid Panel:  Recent Labs  03/23/16 0820  CHOL 148  HDL 61  LDLCALC 74  TRIG 66  CHOLHDL 2.4   Lab Results  Component Value Date   HGBA1C 5.7 (H) 06/29/2016    Assessment/Plan 1. Chronic diastolic heart failure (HCC) -stable, no signs of volume overload, continue lasix '40mg'$  po bid, losartan for this, follows with Dr. Einar Gip  2. Prostate cancer [facility] [address]) -follows with Dr. Junious Silk, continues on watchful waiting, refuses biopsies and intervention -cont doxazosin  3. Anemia in stage 3 chronic kidney disease -not currently on any iron or epo/procrit Lab Results  Component Value Date   HGB 12.3 (L) 06/29/2016  -followed by nephrology, as well  4. Essential hypertension -bp at goal with amlodipine, labetalol, and losartan, also on doxazosin for prostate symptoms  5. Pure hypercholesterolemia Lab Results  Component Value Date   CHOL 148 03/23/2016   HDL 61 03/23/2016   LDLCALC 74 03/23/2016   TRIG 66 03/23/2016   CHOLHDL 2.4 03/23/2016   LDL just above goal of <70 last year, cont pravastatin '80mg'$ --pt has difficulty affording more costly options  6. PAOD (peripheral arterial occlusive disease) ([place]) -follows by cardiology, being medically managed unless emergent occlusion occurs -cont pletal, statin, asa, walking for exercise, cont off cigarettes  7. Chronic kidney disease, stage III (moderate) -Avoid nephrotoxic agents like nsaids, dose adjust renally excreted meds, hydrate. -is to be following with nephrology Lab Results  Component Value Date   CREATININE 1.23 (H) 06/29/2016   8. Aortic valve stenosis, etiology of cardiac valve disease unspecified -just had f/u echo with Dr. Einar Gip to reassess this (in media), cont current bp mgt -has mild stenosis and moderate AR (grade 2)  Labs/tests ordered:  No orders of the defined types were placed in this encounter. Dr. Einar Gip just did labs on pt at his last  appt 02/04/17  Next appt:  07/22/2017 med mgt   [name], D.O. [facility] [address], [place] [address] Cell Phone (Mon-Fri 8am-5pm):  [phone] On Call:  [phone] & follow prompts after 5pm & weekends Office Phone:  [phone] Office Fax:  [phone]   

## 2017-03-14 DIAGNOSIS — I350 Nonrheumatic aortic (valve) stenosis: Secondary | ICD-10-CM | POA: Insufficient documentation

## 2017-04-01 ENCOUNTER — Ambulatory Visit: Payer: Self-pay

## 2017-04-01 ENCOUNTER — Encounter: Payer: Self-pay | Admitting: Internal Medicine

## 2017-07-22 ENCOUNTER — Encounter: Payer: Self-pay | Admitting: Internal Medicine

## 2017-07-22 ENCOUNTER — Ambulatory Visit (INDEPENDENT_AMBULATORY_CARE_PROVIDER_SITE_OTHER): Payer: Medicare PPO | Admitting: Internal Medicine

## 2017-07-22 VITALS — BP 178/88 | HR 69 | Temp 97.6°F | Ht 69.0 in | Wt 160.0 lb

## 2017-07-22 DIAGNOSIS — N183 Chronic kidney disease, stage 3 unspecified: Secondary | ICD-10-CM

## 2017-07-22 DIAGNOSIS — I10 Essential (primary) hypertension: Secondary | ICD-10-CM

## 2017-07-22 DIAGNOSIS — R739 Hyperglycemia, unspecified: Secondary | ICD-10-CM | POA: Diagnosis not present

## 2017-07-22 DIAGNOSIS — D631 Anemia in chronic kidney disease: Secondary | ICD-10-CM | POA: Diagnosis not present

## 2017-07-22 DIAGNOSIS — C61 Malignant neoplasm of prostate: Secondary | ICD-10-CM | POA: Diagnosis not present

## 2017-07-22 DIAGNOSIS — I779 Disorder of arteries and arterioles, unspecified: Secondary | ICD-10-CM

## 2017-07-22 DIAGNOSIS — Z23 Encounter for immunization: Secondary | ICD-10-CM | POA: Diagnosis not present

## 2017-07-22 DIAGNOSIS — I63239 Cerebral infarction due to unspecified occlusion or stenosis of unspecified carotid arteries: Secondary | ICD-10-CM | POA: Diagnosis not present

## 2017-07-22 LAB — COMPLETE METABOLIC PANEL WITH GFR
AG Ratio: 1.3 (calc) (ref 1.0–2.5)
ALT: 17 U/L (ref 9–46)
AST: 27 U/L (ref 10–35)
Albumin: 3.8 g/dL (ref 3.6–5.1)
Alkaline phosphatase (APISO): 57 U/L (ref 40–115)
BUN/Creatinine Ratio: 20 (calc) (ref 6–22)
BUN: 26 mg/dL — ABNORMAL HIGH (ref 7–25)
CO2: 25 mmol/L (ref 20–32)
Calcium: 9.3 mg/dL (ref 8.6–10.3)
Chloride: 107 mmol/L (ref 98–110)
Creat: 1.29 mg/dL — ABNORMAL HIGH (ref 0.70–1.18)
GFR, Est African American: 62 mL/min/{1.73_m2} (ref >=60)
GFR, Est Non African American: 53 mL/min/{1.73_m2} — ABNORMAL LOW (ref >=60)
Globulin: 3 g/dL (calc) (ref 1.9–3.7)
Glucose, Bld: 95 mg/dL (ref 65–99)
Potassium: 4.4 mmol/L (ref 3.5–5.3)
Sodium: 139 mmol/L (ref 135–146)
Total Bilirubin: 0.3 mg/dL (ref 0.2–1.2)
Total Protein: 6.8 g/dL (ref 6.1–8.1)

## 2017-07-22 LAB — CBC WITH DIFFERENTIAL/PLATELET
Basophils Absolute: 29 cells/uL (ref 0–200)
Basophils Relative: 0.6 %
Eosinophils Absolute: 686 cells/uL — ABNORMAL HIGH (ref 15–500)
Eosinophils Relative: 14.3 %
HCT: 39.1 % (ref 38.5–50.0)
Hemoglobin: 13 g/dL — ABNORMAL LOW (ref 13.2–17.1)
Lymphs Abs: 1008 cells/uL (ref 850–3900)
MCH: 29 pg (ref 27.0–33.0)
MCHC: 33.2 g/dL (ref 32.0–36.0)
MCV: 87.1 fL (ref 80.0–100.0)
MPV: 11.1 fL (ref 7.5–12.5)
Monocytes Relative: 12.2 %
Neutro Abs: 2491 cells/uL (ref 1500–7800)
Neutrophils Relative %: 51.9 %
Platelets: 224 10*3/uL (ref 140–400)
RBC: 4.49 10*6/uL (ref 4.20–5.80)
RDW: 12.8 % (ref 11.0–15.0)
Total Lymphocyte: 21 %
WBC mixed population: 586 cells/uL (ref 200–950)
WBC: 4.8 10*3/uL (ref 3.8–10.8)

## 2017-07-22 LAB — LIPID PANEL
Cholesterol: 206 mg/dL — ABNORMAL HIGH (ref <200)
HDL: 60 mg/dL (ref >40)
LDL Cholesterol (Calc): 129 mg/dL (calc) — ABNORMAL HIGH
Non-HDL Cholesterol (Calc): 146 mg/dL (calc) — ABNORMAL HIGH (ref <130)
Total CHOL/HDL Ratio: 3.4 (calc) (ref <5.0)
Triglycerides: 78 mg/dL (ref <150)

## 2017-07-22 NOTE — Progress Notes (Signed)
Location:  [facility] clinic Provider:  Margot Oriordan L. Mariea Clonts, D.O., C.M.D.  Code Status: full code Goals of Care:  Advanced Directives 07/22/2017  Does Patient Have a Medical Advance Directive? No  Would patient like information on creating a medical advance directive? No - Patient declined   Chief Complaint  Patient presents with  . Medical Management of Chronic Issues    76mth follow-up    HPI: Patient is a 76 y.o. male seen today for medical management of chronic diseases.    He is agreeable to his flu shot.  Feeling ok.  No concerns.    HTN:  Says he was eating pineapple before and bp came down.  Is taking his medication, just didn't have this morning.    Says he is not smoking.  There is a smoke odor in the exam room, however.  Says others smoke at home and he smoked in the home back when he did smoke.    PAD/claudication:  Must stop to rest when walking due to pain.  Resolves quickly when he stops.    Hyperglycemia:  No labs in over a year here, but has had with cardiology.  Will check this one.    Prostate cancer:  He is to make an appt to see urology.  He last saw them in July and is to return to see Dr. Junious Silk for PSA and DRE.    CKD:  Need labs.  Not on nsaids.  Not the best at drinking water.  Past Medical History:  Diagnosis Date  . Anemia   . Cataract    traumatic  . Chronic diastolic heart failure ([place])   . Chronic kidney disease, stage III (moderate) (HCC)   . Depressive disorder, not elsewhere classified   . Depressive disorder, not elsewhere classified   . Elevated prostate specific antigen (PSA)   . Elevated prostate specific antigen (PSA)   . Gingivitis   . Hyperlipidemia   . Hypertension   . Hypertensive kidney disease, benign   . 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 Microscopic hematuria   .  Nondependent cannabis abuse   . 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 Peripheral vascular disease, unspecified ([place])   . Pure hypercholesterolemia   . Unspecified essential hypertension   . Unspecified late effects of cerebrovascular disease   . Unspecified late effects of cerebrovascular disease   . Unspecified vitamin D deficiency   . Unspecified vitamin D deficiency     Past Surgical History:  Procedure Laterality Date  . CATARACT EXTRACTION    . CORONARY STENT PLACEMENT     2008  . EYE SURGERY     left eye,    cornea repair  . PERIPHERAL VASCULAR CATHETERIZATION N/A 07/28/2016   Procedure: Lower Extremity Angiography;  Surgeon: Adrian Prows, MD;  Location: [facility];  Service: Cardiovascular;  Laterality: N/A;    Allergies  Allergen Reactions  . Penicillins Other (See Comments)    Unknown  Has patient had a PCN reaction causing immediate rash, facial/tongue/throat swelling, SOB or lightheadedness with hypotension: {unknown Has patient had a PCN reaction causing severe rash involving mucus membranes or skin necrosis: unknown Has patient had a PCN reaction that required hospitalization {unknown Has patient had a PCN reaction occurring within the last 10 years: no If all  of the above answers are "NO", then may proceed with Cephalosporin use.    Outpatient Encounter Medications as of 07/22/2017  Medication Sig  . amLODipine (NORVASC) 10 MG tablet TAKE 1 TABLET ONCE DAILY FOR BLOOD PRESSURE  . aspirin 81 MG tablet Take 81 mg by mouth daily.    Marland Kitchen CALTRATE 600+D 600-800 MG-UNIT TABS TAKE 1 TABLET TWICE DAILY  . cilostazol (PLETAL) 100 MG tablet Take 100 mg by mouth 2 (two) times daily.  Marland Kitchen doxazosin (CARDURA) 4 MG tablet TAKE 1 TABLET ONE TIME DAILY FOR BLOOD PRESSURE  . furosemide (LASIX) 20 MG tablet TAKE ONE TABLET EVERY MORNING AND TAKE ONE TABLET EVERY EVENING  . labetalol (NORMODYNE) 300 MG tablet Take 600 mg by mouth 3  (three) times daily.  Marland Kitchen latanoprost (XALATAN) 0.005 % ophthalmic solution Place 1 drop into both eyes at bedtime.   Marland Kitchen losartan (COZAAR) 50 MG tablet Take 1 tablet (50 mg total) by mouth daily.  . pravastatin (PRAVACHOL) 80 MG tablet TAKE 1 TABLET ONE TIME DAILY TO CONTROL CHOLESTEROL   No facility-administered encounter medications on file as of 07/22/2017.     Review of Systems:  Review of Systems  Constitutional: Negative for chills, fever and malaise/fatigue.  HENT: Negative for congestion.   Eyes: Negative for blurred vision.  Respiratory: Negative for cough and shortness of breath.   Cardiovascular: Negative for chest pain, palpitations and leg swelling.       Claudication   Gastrointestinal: Negative for abdominal pain, blood in stool, constipation and melena.  Genitourinary: Negative for dysuria.  Musculoskeletal: Negative for falls.  Skin: Negative for itching and rash.  Neurological: Negative for dizziness and weakness.  Endo/Heme/Allergies: Does not bruise/bleed easily.  Psychiatric/Behavioral: Negative for depression and memory loss.    Health Maintenance  Topic Date Due  . TETANUS/TDAP  03/14/1960  . INFLUENZA VACCINE  03/03/2017  . PNA vac Low Risk Adult  Completed    Physical Exam: Vitals:   07/22/17 0801  BP: (!) 178/88  Pulse: 69  Temp: 97.6 F (36.4 C)  TempSrc: Oral  SpO2: 98%  Weight: 160 lb (72.6 kg)  Height: 5\' 9"  (1.753 m)   Body mass index is 23.63 kg/m. Physical Exam  Constitutional: He is oriented to person, place, and time. He appears well-developed.  Cardiovascular: Normal rate, regular rhythm and intact distal pulses.  Murmur heard. Systolic murmur loudest in right second ICS but radiates throughout precordium  Pulmonary/Chest: Effort normal and breath sounds normal. No respiratory distress.  Abdominal: Soft. Bowel sounds are normal.  Musculoskeletal: Normal range of motion.  Neurological: He is alert and oriented to person, place, and  time.  Skin: Skin is warm and dry.  Psychiatric:  Flat affect    Lab Results  Component Value Date   HGBA1C 5.7 (H) 06/29/2016    Assessment/Plan 1. Chronic kidney disease, stage III (moderate) (HCC) -Avoid nephrotoxic agents like nsaids, dose adjust renally excreted meds, hydrate. - COMPLETE METABOLIC PANEL WITH GFR  2. Carotid stenosis, symptomatic, with infarction ([place]) -monitored by Dr. Einar Gip, cardiology - COMPLETE METABOLIC PANEL WITH GFR - Lipid panel  3. Anemia in stage 3 chronic kidney disease ([place]) - f/u cbc for ckd - CBC with Differential/Platelet  4. Hyperglycemia -f/u hba1c - COMPLETE METABOLIC PANEL WITH GFR - Hemoglobin A1c  5. Prostate cancer ([place]) - no complaints--arrange f/u with urology (received letter to do this, but has not yet) - CBC with Differential/Platelet - COMPLETE METABOLIC PANEL WITH GFR  6. Essential hypertension -  bp elevated, but hasn't taken his morning meds--reminded to take them before coming to appts - COMPLETE METABOLIC PANEL WITH GFR - Lipid panel  7. 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 Einar Gip  Labs/tests ordered:   Orders Placed This Encounter  Procedures  . Flu vaccine HIGH DOSE PF (Fluzone High dose)  . CBC with Differential/Platelet  . COMPLETE METABOLIC PANEL WITH GFR  . Lipid panel  . Hemoglobin A1c   Next appt:  4 mos med mgt  Latisa Belay L. Tamaria Dunleavy, D.O. [facility] [address], [place] [address] Cell Phone (Mon-Fri 8am-5pm):  [phone] On Call:  [phone] & follow prompts after 5pm & weekends Office Phone:  [phone] Office Fax:  [phone]

## 2017-07-23 LAB — HEMOGLOBIN A1C
Hgb A1c MFr Bld: 6.2 % of total Hgb — ABNORMAL HIGH (ref <5.7)
Mean Plasma Glucose: 131 (calc)
eAG (mmol/L): 7.3 (calc)

## 2017-07-29 ENCOUNTER — Encounter: Payer: Self-pay | Admitting: *Deleted

## 2017-09-22 ENCOUNTER — Ambulatory Visit (INDEPENDENT_AMBULATORY_CARE_PROVIDER_SITE_OTHER): Payer: Medicare PPO | Admitting: Ophthalmology

## 2017-09-22 DIAGNOSIS — I10 Essential (primary) hypertension: Secondary | ICD-10-CM | POA: Diagnosis not present

## 2017-09-22 DIAGNOSIS — H35342 Macular cyst, hole, or pseudohole, left eye: Secondary | ICD-10-CM

## 2017-09-22 DIAGNOSIS — H2511 Age-related nuclear cataract, right eye: Secondary | ICD-10-CM | POA: Diagnosis not present

## 2017-09-22 DIAGNOSIS — H35033 Hypertensive retinopathy, bilateral: Secondary | ICD-10-CM

## 2017-09-22 DIAGNOSIS — H43811 Vitreous degeneration, right eye: Secondary | ICD-10-CM

## 2017-11-18 ENCOUNTER — Ambulatory Visit: Payer: Medicare PPO | Admitting: Internal Medicine

## 2017-11-18 ENCOUNTER — Encounter: Payer: Self-pay | Admitting: Internal Medicine

## 2017-11-18 VITALS — BP 158/70 | HR 56 | Temp 97.4°F | Ht 69.0 in | Wt 163.0 lb

## 2017-11-18 DIAGNOSIS — R739 Hyperglycemia, unspecified: Secondary | ICD-10-CM | POA: Diagnosis not present

## 2017-11-18 DIAGNOSIS — I350 Nonrheumatic aortic (valve) stenosis: Secondary | ICD-10-CM | POA: Diagnosis not present

## 2017-11-18 DIAGNOSIS — N183 Chronic kidney disease, stage 3 unspecified: Secondary | ICD-10-CM

## 2017-11-18 DIAGNOSIS — I351 Nonrheumatic aortic (valve) insufficiency: Secondary | ICD-10-CM

## 2017-11-18 DIAGNOSIS — I779 Disorder of arteries and arterioles, unspecified: Secondary | ICD-10-CM

## 2017-11-18 DIAGNOSIS — D631 Anemia in chronic kidney disease: Secondary | ICD-10-CM

## 2017-11-18 DIAGNOSIS — I739 Peripheral vascular disease, unspecified: Secondary | ICD-10-CM

## 2017-11-18 DIAGNOSIS — I10 Essential (primary) hypertension: Secondary | ICD-10-CM

## 2017-11-18 NOTE — Progress Notes (Signed)
Location:  [facility] clinic Provider:  Annissa Andreoni L. Mariea Clonts, D.O., C.M.D.  Code Status: FULL CODE Goals of Care:  Advanced Directives 11/18/2017  Does Patient Have a Medical Advance Directive? No  Would patient like information on creating a medical advance directive? No - Patient declined     Chief Complaint  Patient presents with  . Medical Management of Chronic Issues    58mth follow-up    HPI: Patient is a 77 y.o. male seen today for medical management of chronic diseases.    He did just take his meds before coming.  His bp was sky high at first.  He is taking pineapple also to help lower his bp.  He feels fine.  No symptoms.  When he was at Dr. Irven Shelling office, he was going to start him on bidil tid, but it appears pt has not yet received this--I see in his note that it was sent to [facility].  Recheck today was 158/70 after the visit.  Cholesterol remains above goal on last labs in Dr. Irven Shelling note.  He discussed arranging for use of repatha for him.  Pt is not a fan of the idea of an injectable med though benefits discussed.  He continues to get claudication in his legs that improves with rest if he walks "a long way".    Pt is unsure of his medications and the list I have does NOT match Dr. Irven Shelling list.  It's unclear which meds he is really taking. I asked him to bring [place] his pill bottles as soon as possible so we can clarify this.  If there remain discrepancies, we will reach out to Dr. Irven Shelling office.  Past Medical History:  Diagnosis Date  . Anemia   . Cataract    traumatic  . Chronic diastolic heart failure ([place])   . Chronic kidney disease, stage III (moderate) (HCC)   . Depressive disorder, not elsewhere classified   . Depressive disorder, not elsewhere classified   . Elevated prostate specific antigen (PSA)   . Elevated prostate specific antigen (PSA)   . Gingivitis   . Hyperlipidemia   . Hypertension   . Hypertensive kidney disease, benign   . Hypertrophy of prostate with  urinary obstruction and other lower urinary tract symptoms (LUTS)   . 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 Microscopic hematuria   . Nondependent cannabis abuse   . 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 Peripheral vascular disease, unspecified ([place])   . Pure hypercholesterolemia   . Unspecified essential hypertension   . Unspecified late effects of cerebrovascular disease   . Unspecified late effects of cerebrovascular disease   . Unspecified vitamin D deficiency   . Unspecified vitamin D deficiency     Past Surgical History:  Procedure Laterality Date  . CATARACT EXTRACTION    . CORONARY STENT PLACEMENT     2008  . EYE SURGERY     left eye,    cornea repair  . PERIPHERAL VASCULAR CATHETERIZATION N/A 07/28/2016   Procedure: Lower Extremity Angiography;  Surgeon: Adrian Prows, MD;  Location: [facility];  Service: Cardiovascular;  Laterality: N/A;    Allergies  Allergen Reactions  . Penicillins Other (See Comments)    Unknown  Has patient had a PCN reaction causing immediate rash, facial/tongue/throat swelling, SOB or lightheadedness with hypotension: {unknown Has patient had a PCN reaction causing severe rash involving mucus membranes  or skin necrosis: unknown Has patient had a PCN reaction that required hospitalization {unknown Has patient had a PCN reaction occurring within the last 10 years: no If all of the above answers are "NO", then may proceed with Cephalosporin use.    Outpatient Encounter Medications as of 11/18/2017  Medication Sig  . amLODipine (NORVASC) 10 MG tablet TAKE 1 TABLET ONCE DAILY FOR BLOOD PRESSURE  . aspirin 81 MG tablet Take 81 mg by mouth daily.    Marland Kitchen CALTRATE 600+D 600-800 MG-UNIT TABS TAKE 1 TABLET TWICE DAILY  . cilostazol (PLETAL) 100 MG tablet Take 100 mg by mouth 2 (two) times daily.  Marland Kitchen doxazosin (CARDURA) 4 MG tablet TAKE 1 TABLET  ONE TIME DAILY FOR BLOOD PRESSURE  . furosemide (LASIX) 20 MG tablet TAKE ONE TABLET EVERY MORNING AND TAKE ONE TABLET EVERY EVENING  . labetalol (NORMODYNE) 300 MG tablet Take 600 mg by mouth 3 (three) times daily.  Marland Kitchen latanoprost (XALATAN) 0.005 % ophthalmic solution Place 1 drop into both eyes at bedtime.   Marland Kitchen losartan (COZAAR) 50 MG tablet Take 1 tablet (50 mg total) by mouth daily.  . pravastatin (PRAVACHOL) 80 MG tablet TAKE 1 TABLET ONE TIME DAILY TO CONTROL CHOLESTEROL   No facility-administered encounter medications on file as of 11/18/2017.     Review of Systems:  Review of Systems  Constitutional: Negative for chills, fever and malaise/fatigue.  HENT: Positive for hearing loss. Negative for congestion.   Eyes: Negative for blurred vision.  Respiratory: Negative for shortness of breath.   Cardiovascular: Negative for chest pain, palpitations and leg swelling.  Gastrointestinal: Negative for abdominal pain.  Genitourinary: Negative for dysuria.  Musculoskeletal: Positive for myalgias. Negative for falls.       In calf muscles with walking long distances  Skin: Negative for itching and rash.  Neurological: Positive for tingling and sensory change. Negative for dizziness.       Arms from cervical spine  Psychiatric/Behavioral: Negative for depression. The patient is not nervous/anxious.     Health Maintenance  Topic Date Due  . TETANUS/TDAP  03[birth year]14/1960  . INFLUENZA VACCINE  03/03/2018  . PNA vac Low Risk Adult  Completed    Physical Exam: Vitals:   11/18/17 1001  BP: (!) 180/80  Pulse: (!) 56  Temp: (!) 97.4 F (36.3 C)  TempSrc: Oral  SpO2: 97%  Weight: 163 lb (73.9 kg)  Height: 5\' 9"  (1.753 m)   Body mass index is 24.07 kg/m. Physical Exam  Constitutional: He is oriented to person, place, and time. He appears well-developed and well-nourished. No distress.  Cardiovascular: Intact distal pulses.  Murmur heard. 4/6 systolic late-peaking murmur loudest in 2nd  ICS on right but radiating throughout precordium  Pulmonary/Chest: Effort normal and breath sounds normal. No respiratory distress.  Abdominal: Bowel sounds are normal.  Musculoskeletal: Normal range of motion.  Neurological: He is alert and oriented to person, place, and time.  Skin: Skin is warm and dry.  Psychiatric:  Flat affect    Labs reviewed: Basic Metabolic Panel: Recent Labs    07/22/17 0904  NA 139  K 4.4  CL 107  CO2 25  GLUCOSE 95  BUN 26*  CREATININE 1.29*  CALCIUM 9.3   Liver Function Tests: Recent Labs    07/22/17 0904  AST 27  ALT 17  BILITOT 0.3  PROT 6.8   No results for input(s): LIPASE, AMYLASE in the last 8760 hours. No results for input(s): AMMONIA in the last 8760 hours.  CBC: Recent Labs    07/22/17 0904  WBC 4.8  NEUTROABS 2,491  HGB 13.0*  HCT 39.1  MCV 87.1  PLT 224   Lipid Panel: Recent Labs    07/22/17 0904  CHOL 206*  HDL 60  LDLCALC 129*  TRIG 78  CHOLHDL 3.4   Lab Results  Component Value Date   HGBA1C 6.2 (H) 07/22/2017    Assessment/Plan 1. 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 Irven Shelling office -no longer smokes  2. Chronic kidney disease, stage III (moderate) (HCC) -ongoing, Avoid nephrotoxic agents like nsaids, dose adjust renally excreted meds, hydrate.  3. 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 Anemia in stage 3 chronic kidney disease (HCC) -ongoing, cont to monitor, asymptomatic  5. Hyperglycemia -ongoing, monitor hba1c, has had dietary counseling  6. Claudication of lower extremity ([place]) -ongoing, does walk and known to be due to #1   7. Uncontrolled hypertension -bp sky high again upon arrival over 180, but had just had meds before his appt so did come down to 158/70 by end of appt -has not yet started the bidil  Dr. Einar Gip prescribed -again, overall it is unclear what he's taking -pt was asked to bring meds themselves here for [place] to determine what he is truly taking so we can confer with Dr. Irven Shelling office and be sure list is accurate  Labs/tests ordered:  Will check labs at appt Next appt:  04/18/2018    Kadon Andrus L. Hera Celaya, D.O. [facility] [address], [place] [address] Cell Phone (Mon-Fri 8am-5pm):  [phone] On Call:  [phone] & follow prompts after 5pm & weekends Office Phone:  [phone] Office Fax:  [phone]

## 2017-11-25 ENCOUNTER — Telehealth: Payer: Self-pay | Admitting: *Deleted

## 2017-11-25 NOTE — Telephone Encounter (Signed)
Received Handicap Placard. Filled out and placed in Dr. Cyndi Lennert folder to review and sign. Please call patient once ready for pick up [phone]

## 2018-01-11 ENCOUNTER — Telehealth: Payer: Self-pay | Admitting: *Deleted

## 2018-01-11 NOTE — Telephone Encounter (Signed)
Patient dropped off Handicap Placard for Provider to sign. Placed in Dr. Cyndi Lennert folder to review and sign. Please call patient once ready for pick up. [phone]

## 2018-01-13 NOTE — Telephone Encounter (Signed)
Left message on machine to advise that form is ready for pick-up, will leave at front desk.

## 2018-03-10 ENCOUNTER — Other Ambulatory Visit: Payer: Self-pay | Admitting: *Deleted

## 2018-03-10 MED ORDER — LOSARTAN POTASSIUM 50 MG PO TABS: 50.0000 mg | ORAL_TABLET | Freq: Every day | ORAL | 3 refills | Status: DC

## 2018-03-10 MED ORDER — FUROSEMIDE 20 MG PO TABS: ORAL_TABLET | ORAL | 3 refills | Status: DC

## 2018-03-10 MED ORDER — DOXAZOSIN MESYLATE 4 MG PO TABS: ORAL_TABLET | ORAL | 3 refills | Status: DC

## 2018-03-10 MED ORDER — AMLODIPINE BESYLATE 10 MG PO TABS: ORAL_TABLET | ORAL | 3 refills | Status: DC

## 2018-03-10 NOTE — Telephone Encounter (Signed)
Patient requested refills to be sent to [facility].

## 2018-04-18 ENCOUNTER — Ambulatory Visit (INDEPENDENT_AMBULATORY_CARE_PROVIDER_SITE_OTHER): Payer: Medicare PPO

## 2018-04-18 ENCOUNTER — Ambulatory Visit: Payer: Medicare PPO | Admitting: Internal Medicine

## 2018-04-18 ENCOUNTER — Encounter: Payer: Self-pay | Admitting: Internal Medicine

## 2018-04-18 VITALS — BP 152/60 | HR 56 | Temp 97.5°F | Ht 69.0 in | Wt 161.0 lb

## 2018-04-18 DIAGNOSIS — Z91148 Patient's other noncompliance with medication regimen for other reason: Secondary | ICD-10-CM

## 2018-04-18 DIAGNOSIS — I5032 Chronic diastolic (congestive) heart failure: Secondary | ICD-10-CM

## 2018-04-18 DIAGNOSIS — I779 Disorder of arteries and arterioles, unspecified: Secondary | ICD-10-CM | POA: Diagnosis not present

## 2018-04-18 DIAGNOSIS — Z79899 Other long term (current) drug therapy: Secondary | ICD-10-CM | POA: Diagnosis not present

## 2018-04-18 DIAGNOSIS — Z23 Encounter for immunization: Secondary | ICD-10-CM | POA: Diagnosis not present

## 2018-04-18 DIAGNOSIS — I10 Essential (primary) hypertension: Secondary | ICD-10-CM | POA: Diagnosis not present

## 2018-04-18 DIAGNOSIS — N183 Chronic kidney disease, stage 3 unspecified: Secondary | ICD-10-CM

## 2018-04-18 DIAGNOSIS — Z0000 Encounter for general adult medical examination without abnormal findings: Secondary | ICD-10-CM | POA: Diagnosis not present

## 2018-04-18 DIAGNOSIS — D631 Anemia in chronic kidney disease: Secondary | ICD-10-CM

## 2018-04-18 DIAGNOSIS — Z9114 Patient's other noncompliance with medication regimen: Secondary | ICD-10-CM

## 2018-04-18 MED ORDER — TETANUS-DIPHTH-ACELL PERTUSSIS 5-2.5-18.5 LF-MCG/0.5 IM SUSP: 0.5000 mL | Freq: Once | INTRAMUSCULAR | 0 refills | Status: AC

## 2018-04-18 MED ORDER — ZOSTER VAC RECOMB ADJUVANTED 50 MCG/0.5ML IM SUSR: 0.5000 mL | Freq: Once | INTRAMUSCULAR | 1 refills | Status: AC

## 2018-04-18 NOTE — Progress Notes (Signed)
Location:  [facility] clinic Provider:  Terron Merfeld L. Mariea Clonts, D.O., C.M.D.  Code Status: full code Goals of Care:  Advanced Directives 04/18/2018  Does Patient Have a Medical Advance Directive? No  Would patient like information on creating a medical advance directive? Yes (MAU/Ambulatory/Procedural Areas - Information given)   Chief Complaint  Patient presents with  . Medical Management of Chronic Issues    55mth follow-up    HPI: Patient is a 77 y.o. male seen today for medical management of chronic diseases.    Pt is not taking meds as prescribed by Dr. Einar Gip.  List below is what he has in his bag of medications.  He also had two bottles of valsartan/hctz with very few pills in them in the bag.  He does not have any bidil, zetia or crestor.    BP elevated, but has never been normal at a visit probably because each of his providers things he's taking different medications.    No pain.  No difficulty sleeping.  Legs get tired walking long distances.  He goes back to Dr. Esaw Grandchild in April.  No difficulty seeing.  Hearing ok.  No chest pain.  No shortness of breath.  No edema.  Gets up 2-3 times per night due to drinking a lot of water.    MMSE - Mini Mental State Exam 04/18/2018 03/10/2017 03/26/2016  Orientation to time 5 5 5   Orientation to Place 4 5 5   Registration 3 3 3   Attention/ Calculation 5 5 5   Recall 1 2 2   Language- name 2 objects 2 2 2   Language- repeat 1 1 1   Language- follow 3 step command 3 3 2   Language- read & follow direction 1 1 1   Write a sentence 1 1 1   Copy design 1 1 1   Total score 27 29 28   Failed clock b/c hands were reversed but pointed at the correct numbers.  He had a PSA and urine sample at the urologist.   Past Medical History:  Diagnosis Date  . Anemia   . Cataract    traumatic  . Chronic diastolic heart failure ([place])   . Chronic kidney disease, stage III (moderate) (HCC)   . Depressive disorder, not elsewhere classified   . Depressive disorder, not  elsewhere classified   . Elevated prostate specific antigen (PSA)   . Elevated prostate specific antigen (PSA)   . Gingivitis   . Hyperlipidemia   . Hypertension   . Hypertensive kidney disease, benign   . 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 Microscopic hematuria   . Nondependent cannabis abuse   . 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 Peripheral vascular disease, unspecified ([place])   . Pure hypercholesterolemia   . Unspecified essential hypertension   . Unspecified late effects of cerebrovascular disease   . Unspecified late effects of cerebrovascular disease   . Unspecified vitamin D deficiency   . Unspecified vitamin D deficiency     Past Surgical History:  Procedure Laterality Date  . CATARACT EXTRACTION    . CORONARY STENT PLACEMENT     2008  . EYE SURGERY     left eye,    cornea repair  . PERIPHERAL VASCULAR CATHETERIZATION N/A 07/28/2016   Procedure: Lower Extremity Angiography;  Surgeon: Adrian Prows, MD;  Location: [facility];  Service: Cardiovascular;  Laterality: N/A;    Allergies  Allergen Reactions  . Penicillins Other (See Comments)    Unknown  Has patient had a PCN reaction causing immediate rash, facial/tongue/throat swelling, SOB or lightheadedness with hypotension: {unknown Has patient had a PCN reaction causing severe rash involving mucus membranes or skin necrosis: unknown Has patient had a PCN reaction that required hospitalization {unknown Has patient had a PCN reaction occurring within the last 10 years: no If all of the above answers are "NO", then may proceed with Cephalosporin use.    Outpatient Encounter Medications as of 04/18/2018  Medication Sig  . amLODipine (NORVASC) 10 MG tablet Take one tablet by mouth once daily for blood  pressure  . aspirin 81 MG tablet Take 81 mg by mouth daily.    Marland Kitchen CALTRATE 600+D 600-800 MG-UNIT TABS TAKE 1 TABLET TWICE DAILY  . cilostazol (PLETAL) 100 MG tablet Take 100 mg by mouth 2 (two) times daily.  Marland Kitchen doxazosin (CARDURA) 4 MG tablet Take one tablet by mouth once daily for blood pressure  . furosemide (LASIX) 20 MG tablet Take one tablet by mouth every morning and take one tablet by mouth every evening  . labetalol (NORMODYNE) 300 MG tablet Take 300 mg by mouth 3 (three) times daily.   Marland Kitchen latanoprost (XALATAN) 0.005 % ophthalmic solution Place 1 drop into both eyes at bedtime.   Marland Kitchen losartan (COZAAR) 50 MG tablet Take 1 tablet (50 mg total) by mouth daily.  . pravastatin (PRAVACHOL) 80 MG tablet TAKE 1 TABLET ONE TIME DAILY TO CONTROL CHOLESTEROL   No facility-administered encounter medications on file as of 04/18/2018.     Review of Systems:  Review of Systems  Constitutional: Negative for chills, fever and malaise/fatigue.  HENT: Negative for congestion and hearing loss.   Eyes: Negative for blurred vision.  Respiratory: Negative for cough and shortness of breath.   Cardiovascular: Negative for chest pain, palpitations and leg swelling.  Gastrointestinal: Negative for abdominal pain, blood in stool, constipation, diarrhea and melena.  Genitourinary: Negative for dysuria.  Musculoskeletal: Positive for myalgias. Negative for falls and joint pain.  Skin: Negative for itching and rash.  Neurological: Negative for dizziness and loss of consciousness.  Endo/Heme/Allergies: Does not bruise/bleed easily.  Psychiatric/Behavioral: Positive for memory loss. Negative for depression. The patient is not nervous/anxious and does not have insomnia.     Health Maintenance  Topic Date Due  . TETANUS/TDAP  03/14/1960  . INFLUENZA VACCINE  03/03/2018  . PNA vac Low Risk Adult  Completed    Physical Exam: Vitals:   04/18/18 1000  BP: (!) 152/60  Pulse: (!) 56  Temp: (!) 97.5 F (36.4 C)    TempSrc: Oral  Weight: 161 lb (73 kg)  Height: 5\' 9"  (1.753 m)   Body mass index is 23.78 kg/m. Physical Exam  Constitutional: He is oriented to person, place, and time. No distress.  HENT:  Head: Normocephalic and atraumatic.  Cardiovascular: Normal rate, regular rhythm and intact distal pulses.  Murmur heard. 4/6 systolic murmur at second ICS on right, but audible throughout precordium  Pulmonary/Chest: Effort normal and breath sounds normal. No respiratory distress.  Abdominal: Soft. Bowel sounds are normal.  Musculoskeletal: Normal range of motion.  Neurological: He is alert and oriented to person, place, and time.  Skin: Skin is warm and dry. Capillary refill takes less than 2 seconds.  Psychiatric: He has a normal mood and affect.    Labs reviewed: Basic Metabolic Panel:  Recent Labs    07/22/17 0904  NA 139  K 4.4  CL 107  CO2 25  GLUCOSE 95  BUN 26*  CREATININE 1.29*  CALCIUM 9.3   Liver Function Tests: Recent Labs    07/22/17 0904  AST 27  ALT 17  BILITOT 0.3  PROT 6.8   No results for input(s): LIPASE, AMYLASE in the last 8760 hours. No results for input(s): AMMONIA in the last 8760 hours. CBC: Recent Labs    07/22/17 0904  WBC 4.8  NEUTROABS 2,491  HGB 13.0*  HCT 39.1  MCV 87.1  PLT 224   Lipid Panel: Recent Labs    07/22/17 0904  CHOL 206*  HDL 60  LDLCALC 129*  TRIG 78  CHOLHDL 3.4   Lab Results  Component Value Date   HGBA1C 6.2 (H) 07/22/2017     Assessment/Plan 1. Polypharmacy - pt on multiple meds and sees multiple specialists, has some cognitive impairment which seems to be mild, but struggles with medication mgt for several years -education limited, as well, and has difficulty understanding instructions - discussed idea of pillpack for him and placed referral for [facility] due to his medication adherence issues (BP always high as a result)--recommend a pharmacist review his meds and also review the notes from Dr Einar Gip which  include different meds pt does not have in his possession (likely due to cost) - AMB Referral to [facility]  2. 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 Einar Gip once a year, does have some claudication  - AMB Referral to [place] [facility] - Lipid panel  3. Chronic kidney disease, stage III (moderate) (HCC) - has been stable last two checks, recheck today - AMB Referral to [place] [facility]  4. Anemia in stage 3 chronic kidney disease ([place]) - had been improving, recheck cbc - AMB Referral to [facility] - CBC with Differential/Platelet - COMPLETE METABOLIC PANEL WITH GFR  5. Uncontrolled hypertension - due to nonadherence with med regimen--seems unintentional and may be due to educational level, literacy, and some MCI - AMB Referral to [place] [facility]  6. Chronic diastolic heart failure (HCC) - stable and asymptomatic, followed by Dr. Einar Gip - AMB Referral to [facility]  7. Medication nonadherence due to psychosocial problem -not entirely clear why he struggles with this, but seems it's related to his educational level and literacy as well as cost of meds -see above -pt has been advised that he will receive a phone call from [facility] and included in AVS  8. Need for influenza vaccination -flu shot given today  Labs/tests ordered:   Orders Placed This Encounter  Procedures  . Flu vaccine HIGH DOSE PF (Fluzone High dose)  . CBC with Differential/Platelet  . COMPLETE METABOLIC PANEL WITH GFR  . Lipid panel  . AMB Referral to [place] [facility]    Referral Priority:   Routine    Referral Type:   Consultation    Referral Reason:   [facility]    Number of Visits Requested:   1    Next appt:  4 mos with me, 1 year with Clarise Cruz for [place] Jairus Tonne, D.O. [facility] [address], [place] [address] Cell Phone  (Mon-Fri 8am-5pm):  [phone] On Call:  [phone] & follow prompts after 5pm & weekends Office Phone:  [phone] Office Fax:  [phone]

## 2018-04-18 NOTE — Patient Instructions (Signed)
[facility] care manager or pharmacist may call you.

## 2018-04-18 NOTE — Patient Instructions (Addendum)
[name] , Thank you for taking time to come for your Medicare Wellness Visit. I appreciate your ongoing commitment to your health goals. Please review the following plan we discussed and let me know if I can assist you in the future.   Screening recommendations/referrals: Colonoscopy excluded, over age 77 Recommended yearly ophthalmology/optometry visit for glaucoma screening and checkup Recommended yearly dental visit for hygiene and checkup  Vaccinations: Influenza vaccine due, please get soon Pneumococcal vaccine up to date, completed Tdap vaccine due, ordered to [facility]. Make sure you check cost before getting   Shingles vaccine due,ordered to [facility]. Make sure you check cost before getting    Advanced directives: Advance directive discussed with you today. I have provided a copy for you to complete at home and have notarized. Once this is complete please bring a copy in to our office so we can scan it into your chart.  Conditions/risks identified: none  Next appointment: David Dense, RN 04/24/2019 @ 8:30am  Preventive Care 39 Years and Older, Male Preventive care refers to lifestyle choices and visits with your health care provider that can promote health and wellness. What does preventive care include?  A yearly physical exam. This is also called an annual well check.  Dental exams once or twice a year.  Routine eye exams. Ask your health care provider how often you should have your eyes checked.  Personal lifestyle choices, including:  Daily care of your teeth and gums.  Regular physical activity.  Eating a healthy diet.  Avoiding tobacco and drug use.  Limiting alcohol use.  Practicing safe sex.  Taking low doses of aspirin every day.  Taking vitamin and mineral supplements as recommended by your health care provider. What happens during an annual well check? The services and screenings done by your health care provider during your annual well check will depend on  your age, overall health, lifestyle risk factors, and family history of disease. Counseling  Your health care provider may ask you questions about your:  Alcohol use.  Tobacco use.  Drug use.  Emotional well-being.  Home and relationship well-being.  Sexual activity.  Eating habits.  History of falls.  Memory and ability to understand (cognition).  Work and work Statistician. Screening  You may have the following tests or measurements:  Height, weight, and BMI.  Blood pressure.  Lipid and cholesterol levels. These may be checked every 5 years, or more frequently if you are over 78 years old.  Skin check.  Lung cancer screening. You may have this screening every year starting at age 58 if you have a 30-pack-year history of smoking and currently smoke or have quit within the past 15 years.  Fecal occult blood test (FOBT) of the stool. You may have this test every year starting at age 68.  Flexible sigmoidoscopy or colonoscopy. You may have a sigmoidoscopy every 5 years or a colonoscopy every 10 years starting at age 66.  Prostate cancer screening. Recommendations will vary depending on your family history and other risks.  Hepatitis C blood test.  Hepatitis B blood test.  Sexually transmitted disease (STD) testing.  Diabetes screening. This is done by checking your blood sugar (glucose) after you have not eaten for a while (fasting). You may have this done every 1-3 years.  Abdominal aortic aneurysm (AAA) screening. You may need this if you are a current or former smoker.  Osteoporosis. You may be screened starting at age 40 if you are at high risk. Talk with  your health care provider about your test results, treatment options, and if necessary, the need for more tests. Vaccines  Your health care provider may recommend certain vaccines, such as:  Influenza vaccine. This is recommended every year.  Tetanus, diphtheria, and acellular pertussis (Tdap, Td) vaccine.  You may need a Td booster every 10 years.  Zoster vaccine. You may need this after age 47.  Pneumococcal 13-valent conjugate (PCV13) vaccine. One dose is recommended after age 34.  Pneumococcal polysaccharide (PPSV23) vaccine. One dose is recommended after age 34. Talk to your health care provider about which screenings and vaccines you need and how often you need them. This information is not intended to replace advice given to you by your health care provider. Make sure you discuss any questions you have with your health care provider. Document Released: 08/16/2015 Document Revised: 04/08/2016 Document Reviewed: 05/21/2015 Elsevier Interactive Patient Education  2017 [place] Prevention in the Home Falls can cause injuries. They can happen to people of all ages. There are many things you can do to make your home safe and to help prevent falls. What can I do on the outside of my home?  Regularly fix the edges of walkways and driveways and fix any cracks.  Remove anything that might make you trip as you walk through a door, such as a raised step or threshold.  Trim any bushes or trees on the path to your home.  Use bright outdoor lighting.  Clear any walking paths of anything that might make someone trip, such as rocks or tools.  Regularly check to see if handrails are loose or broken. Make sure that both sides of any steps have handrails.  Any raised decks and porches should have guardrails on the edges.  Have any leaves, snow, or ice cleared regularly.  Use sand or salt on walking paths during winter.  Clean up any spills in your garage right away. This includes oil or grease spills. What can I do in the bathroom?  Use night lights.  Install grab bars by the toilet and in the tub and shower. Do not use towel bars as grab bars.  Use non-skid mats or decals in the tub or shower.  If you need to sit down in the shower, use a plastic, non-slip stool.  Keep the  floor dry. Clean up any water that spills on the floor as soon as it happens.  Remove soap buildup in the tub or shower regularly.  Attach bath mats securely with double-sided non-slip rug tape.  Do not have throw rugs and other things on the floor that can make you trip. What can I do in the bedroom?  Use night lights.  Make sure that you have a light by your bed that is easy to reach.  Do not use any sheets or blankets that are too big for your bed. They should not hang down onto the floor.  Have a firm chair that has side arms. You can use this for support while you get dressed.  Do not have throw rugs and other things on the floor that can make you trip. What can I do in the kitchen?  Clean up any spills right away.  Avoid walking on wet floors.  Keep items that you use a lot in easy-to-reach places.  If you need to reach something above you, use a strong step stool that has a grab bar.  Keep electrical cords out of the way.  Do not  use floor polish or wax that makes floors slippery. If you must use wax, use non-skid floor wax.  Do not have throw rugs and other things on the floor that can make you trip. What can I do with my stairs?  Do not leave any items on the stairs.  Make sure that there are handrails on both sides of the stairs and use them. Fix handrails that are broken or loose. Make sure that handrails are as long as the stairways.  Check any carpeting to make sure that it is firmly attached to the stairs. Fix any carpet that is loose or worn.  Avoid having throw rugs at the top or bottom of the stairs. If you do have throw rugs, attach them to the floor with carpet tape.  Make sure that you have a light switch at the top of the stairs and the bottom of the stairs. If you do not have them, ask someone to add them for you. What else can I do to help prevent falls?  Wear shoes that:  Do not have high heels.  Have rubber bottoms.  Are comfortable and fit  you well.  Are closed at the toe. Do not wear sandals.  If you use a stepladder:  Make sure that it is fully opened. Do not climb a closed stepladder.  Make sure that both sides of the stepladder are locked into place.  Ask someone to hold it for you, if possible.  Clearly mark and make sure that you can see:  Any grab bars or handrails.  First and last steps.  Where the edge of each step is.  Use tools that help you move around (mobility aids) if they are needed. These include:  Canes.  Walkers.  Scooters.  Crutches.  Turn on the lights when you go into a dark area. Replace any light bulbs as soon as they burn out.  Set up your furniture so you have a clear path. Avoid moving your furniture around.  If any of your floors are uneven, fix them.  If there are any pets around you, be aware of where they are.  Review your medicines with your doctor. Some medicines can make you feel dizzy. This can increase your chance of falling. Ask your doctor what other things that you can do to help prevent falls. This information is not intended to replace advice given to you by your health care provider. Make sure you discuss any questions you have with your health care provider. Document Released: 05/16/2009 Document Revised: 12/26/2015 Document Reviewed: 08/24/2014 Elsevier Interactive Patient Education  2017 [employer].

## 2018-04-18 NOTE — Progress Notes (Signed)
Subjective:   [name] is a 77 y.o. male who presents for Medicare Annual/Subsequent preventive examination.  Last AWV-03/10/2017  Objective:    Vitals: BP (!) 152/60 (BP Location: Left Arm, Patient Position: Sitting)   Pulse (!) 56   Temp (!) 97.5 F (36.4 C) (Oral)   Ht 5\' 9"  (1.753 m)   Wt 161 lb (73 kg)   SpO2 97%   BMI 23.78 kg/m   Body mass index is 23.78 kg/m. Provider notified of BP  Advanced Directives 04/18/2018 11/18/2017 07/22/2017 03/10/2017 11/02/2016 03/26/2016 12/27/2015  Does Patient Have a Medical Advance Directive? No No No No No No No  Would patient like information on creating a medical advance directive? Yes (MAU/Ambulatory/Procedural Areas - Information given) No - Patient declined No - Patient declined Yes (MAU/Ambulatory/Procedural Areas - Information given) No - Patient declined No - patient declined information -    Tobacco Social History   Tobacco Use  Smoking Status Former Smoker  . Packs/day: 0.50  . Years: 13.00  . Pack years: 6.50  . Last attempt to quit: 03/09/1971  . Years since quitting: 47.1  Smokeless Tobacco Never Used     Counseling given: Not Answered   Clinical Intake:  Pre-visit preparation completed: No  Pain : No/denies pain     Nutritional Risks: None Diabetes: No  How often do you need to have someone help you when you read instructions, pamphlets, or other written materials from your doctor or pharmacy?: 1 - Never What is the last grade level you completed in school?: 1 year college  Interpreter Needed?: No  Information entered by :: [name], RN  Past Medical History:  Diagnosis Date  . Anemia   . Cataract    traumatic  . Chronic diastolic heart failure ([place])   . Chronic kidney disease, stage III (moderate) (HCC)   . Depressive disorder, not elsewhere classified   . Depressive disorder, not elsewhere classified   . Elevated prostate specific antigen (PSA)   . Elevated prostate specific antigen (PSA)    . Gingivitis   . Hyperlipidemia   . Hypertension   . Hypertensive kidney disease, benign   . 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 Microscopic hematuria   . Nondependent cannabis abuse   . 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 Peripheral vascular disease, unspecified ([name])   . Pure hypercholesterolemia   . Unspecified essential hypertension   . Unspecified late effects of cerebrovascular disease   . Unspecified late effects of cerebrovascular disease   . Unspecified vitamin D deficiency   . Unspecified vitamin D deficiency    Past Surgical History:  Procedure Laterality Date  . CATARACT EXTRACTION    . CORONARY STENT PLACEMENT     2008  . EYE SURGERY     left eye,    cornea repair  . PERIPHERAL VASCULAR CATHETERIZATION N/A 07/28/2016   Procedure: Lower Extremity Angiography;  Surgeon: Adrian Prows, MD;  Location: [place] [facility];  Service: Cardiovascular;  Laterality: N/A;   Family History  Problem Relation Age of Onset  . Cancer Father    Social History   Socioeconomic History  . Marital status: Divorced    Spouse name: Not on file  . Number of children: Not on file  . Years of education: Not on  file  . Highest education level: Not on file  Occupational History  . Not on file  Social Needs  . Financial resource strain: Not hard at all  . Food insecurity:    Worry: Never true    Inability: Never true  . Transportation needs:    Medical: No    Non-medical: No  Tobacco Use  . Smoking status: Former Smoker    Packs/day: 0.50    Years: 13.00    Pack years: 6.50    Last attempt to quit: 03/09/1971    Years since quitting: 47.1  . Smokeless tobacco: Never Used  Substance and Sexual Activity  . Alcohol use: No    Alcohol/week: 0.0 standard drinks  . Drug use:  No  . Sexual activity: Not on file  Lifestyle  . Physical activity:    Days per week: 4 days    Minutes per session: 10 min  . Stress: Not at all  Relationships  . Social connections:    Talks on phone: More than three times a week    Gets together: More than three times a week    Attends religious service: Never    Active member of club or organization: No    Attends meetings of clubs or organizations: Never    Relationship status: Divorced  Other Topics Concern  . Not on file  Social History Narrative  . Not on file    Outpatient Encounter Medications as of 04/18/2018  Medication Sig  . amLODipine (NORVASC) 10 MG tablet Take one tablet by mouth once daily for blood pressure  . aspirin 81 MG tablet Take 81 mg by mouth daily.    Marland Kitchen CALTRATE 600+D 600-800 MG-UNIT TABS TAKE 1 TABLET TWICE DAILY  . cilostazol (PLETAL) 100 MG tablet Take 100 mg by mouth 2 (two) times daily.  Marland Kitchen doxazosin (CARDURA) 4 MG tablet Take one tablet by mouth once daily for blood pressure  . furosemide (LASIX) 20 MG tablet Take one tablet by mouth every morning and take one tablet by mouth every evening  . labetalol (NORMODYNE) 300 MG tablet Take 300 mg by mouth 3 (three) times daily.   Marland Kitchen latanoprost (XALATAN) 0.005 % ophthalmic solution Place 1 drop into both eyes at bedtime.   Marland Kitchen losartan (COZAAR) 50 MG tablet Take 1 tablet (50 mg total) by mouth daily.  . pravastatin (PRAVACHOL) 80 MG tablet TAKE 1 TABLET ONE TIME DAILY TO CONTROL CHOLESTEROL  . Tdap (BOOSTRIX) 5-2.5-18.5 LF-MCG/0.5 injection Inject 0.5 mLs into the muscle once for 1 dose.  Marland Kitchen Zoster Vaccine Adjuvanted [facility]) injection Inject 0.5 mLs into the muscle once for 1 dose.  . [DISCONTINUED] Tdap (BOOSTRIX) 5-2.5-18.5 LF-MCG/0.5 injection Inject 0.5 mLs into the muscle once.  . [DISCONTINUED] Zoster Vaccine Adjuvanted [facility]) injection Inject 0.5 mLs into the muscle once.   No facility-administered encounter medications on file as of 04/18/2018.      Activities of Daily Living In your present state of health, do you have any difficulty performing the following activities: 04/18/2018  Hearing? N  Vision? N  Difficulty concentrating or making decisions? N  Walking or climbing stairs? N  Dressing or bathing? N  Doing errands, shopping? N  Preparing Food and eating ? N  Using the Toilet? N  In the past six months, have you accidently leaked urine? N  Do you have problems with loss of bowel control? N  Managing your Medications? N  Managing your Finances? N  Housekeeping or managing your Housekeeping? N  Some recent data might be hidden    Patient Care Team: Gayland Curry, DO as PCP - General (Geriatric Medicine) Adrian Prows, MD as Consulting Physician (Cardiology) Clent Jacks, MD as Consulting Physician (Ophthalmology) Mal Misty, MD as Consulting Physician (Vascular Surgery) Garvin Fila, MD as Consulting Physician (Neurology) Hayden Pedro, MD as Consulting Physician (Ophthalmology)   Assessment:   This is a routine wellness examination for [name].  Exercise Activities and Dietary recommendations Current Exercise Habits: Home exercise routine, Type of exercise: strength training/weights;stretching, Time (Minutes): 10, Frequency (Times/Week): 4, Weekly Exercise (Minutes/Week): 40, Intensity: Mild, Exercise limited by: None identified  Goals    . Maintain lifestyle     Patient will maintain Lifestyle       Fall Risk Fall Risk  04/18/2018 11/18/2017 07/22/2017 03/10/2017 11/02/2016  Falls in the past year? No No No No No   Is the patient's home free of loose throw rugs in walkways, pet beds, electrical cords, etc?   yes      Grab bars in the bathroom? yes      Handrails on the stairs?   yes      Adequate lighting?   yes  Depression Screen PHQ 2/9 Scores 04/18/2018 11/18/2017 07/22/2017 03/10/2017  PHQ - 2 Score 0 0 0 0    Cognitive Function MMSE - Mini Mental State Exam 04/18/2018 03/10/2017 03/26/2016    Orientation to time 5 5 5   Orientation to Place 4 5 5   Registration 3 3 3   Attention/ Calculation 5 5 5   Recall 1 2 2   Language- name 2 objects 2 2 2   Language- repeat 1 1 1   Language- follow 3 step command 3 3 2   Language- read & follow direction 1 1 1   Write a sentence 1 1 1   Copy design 1 1 1   Total score 27 29 28         Immunization History  Administered Date(s) Administered  . Influenza Split 04/29/2010  . Influenza, High Dose Seasonal PF 07/22/2017  . Influenza,inj,Quad PF,6+ Mos 06/05/2013, 07/09/2014, 06/14/2015  . Pneumococcal Conjugate-13 10/02/2013  . Pneumococcal Polysaccharide-23 11/05/2011    Qualifies for Shingles Vaccine? Yes, educated and ordered to pharmacy  Screening Tests Health Maintenance  Topic Date Due  . Samul Dada  03/14/1960  . INFLUENZA VACCINE  03/03/2018  . PNA vac Low Risk Adult  Completed   Cancer Screenings: Lung: Low Dose CT Chest recommended if Age 69-80 years, 30 pack-year currently smoking OR have quit w/in 15years. Patient does not qualify. Colorectal: up to date  Additional Screenings:  Hepatitis C Screening: declined TDAP due: ordered Flu vaccine due: patient wants to wait      Plan:    I have personally reviewed and addressed the Medicare Annual Wellness questionnaire and have noted the following in the patient's chart:  A. Medical and social history B. Use of alcohol, tobacco or illicit drugs  C. Current medications and supplements D. Functional ability and status E.  Nutritional status F.  Physical activity G. Advance directives H. List of other physicians I.  Hospitalizations, surgeries, and ER visits in previous 12 months J.  [place] to include hearing, vision, cognitive, depression L. Referrals and appointments - none  In addition, I have reviewed and discussed with patient certain preventive protocols, quality metrics, and best practice recommendations. A written personalized care plan for  preventive services as well as general preventive health recommendations were provided to patient.  See attached scanned questionnaire for additional  information.   Signed,   [name], RN Nurse Health Advisor  Patient Concerns: None

## 2018-04-19 LAB — LIPID PANEL
Cholesterol: 188 mg/dL (ref <200)
HDL: 52 mg/dL (ref >40)
LDL Cholesterol (Calc): 117 mg/dL (calc) — ABNORMAL HIGH
Non-HDL Cholesterol (Calc): 136 mg/dL (calc) — ABNORMAL HIGH (ref <130)
Total CHOL/HDL Ratio: 3.6 (calc) (ref <5.0)
Triglycerides: 88 mg/dL (ref <150)

## 2018-04-19 LAB — COMPLETE METABOLIC PANEL WITH GFR
AG Ratio: 1.4 (calc) (ref 1.0–2.5)
ALT: 15 U/L (ref 9–46)
AST: 24 U/L (ref 10–35)
Albumin: 4.1 g/dL (ref 3.6–5.1)
Alkaline phosphatase (APISO): 64 U/L (ref 40–115)
BUN/Creatinine Ratio: 24 (calc) — ABNORMAL HIGH (ref 6–22)
BUN: 26 mg/dL — ABNORMAL HIGH (ref 7–25)
CO2: 25 mmol/L (ref 20–32)
Calcium: 9.7 mg/dL (ref 8.6–10.3)
Chloride: 110 mmol/L (ref 98–110)
Creat: 1.1 mg/dL (ref 0.70–1.18)
GFR, Est African American: 75 mL/min/{1.73_m2} (ref >=60)
GFR, Est Non African American: 64 mL/min/{1.73_m2} (ref >=60)
Globulin: 2.9 g/dL (calc) (ref 1.9–3.7)
Glucose, Bld: 99 mg/dL (ref 65–139)
Potassium: 5 mmol/L (ref 3.5–5.3)
Sodium: 142 mmol/L (ref 135–146)
Total Bilirubin: 0.4 mg/dL (ref 0.2–1.2)
Total Protein: 7 g/dL (ref 6.1–8.1)

## 2018-04-19 LAB — CBC WITH DIFFERENTIAL/PLATELET
Basophils Absolute: 21 cells/uL (ref 0–200)
Basophils Relative: 0.4 %
Eosinophils Absolute: 692 cells/uL — ABNORMAL HIGH (ref 15–500)
Eosinophils Relative: 13.3 %
HCT: 39.3 % (ref 38.5–50.0)
Hemoglobin: 12.7 g/dL — ABNORMAL LOW (ref 13.2–17.1)
Lymphs Abs: 1227 cells/uL (ref 850–3900)
MCH: 29 pg (ref 27.0–33.0)
MCHC: 32.3 g/dL (ref 32.0–36.0)
MCV: 89.7 fL (ref 80.0–100.0)
MPV: 11.3 fL (ref 7.5–12.5)
Monocytes Relative: 12.4 %
Neutro Abs: 2616 cells/uL (ref 1500–7800)
Neutrophils Relative %: 50.3 %
Platelets: 220 10*3/uL (ref 140–400)
RBC: 4.38 10*6/uL (ref 4.20–5.80)
RDW: 13.1 % (ref 11.0–15.0)
Total Lymphocyte: 23.6 %
WBC mixed population: 645 cells/uL (ref 200–950)
WBC: 5.2 10*3/uL (ref 3.8–10.8)

## 2018-04-22 ENCOUNTER — Encounter: Payer: Self-pay | Admitting: *Deleted

## 2018-04-27 ENCOUNTER — Other Ambulatory Visit: Payer: Self-pay

## 2018-04-27 NOTE — Patient Outreach (Addendum)
[facility]) Care Management  04/27/2018  [name] 1940/08/04 [id number]  TELEPHONE SCREENING Referral date: 04/26/18 Referral source: primary MD  Referral reason: medication discrepencies Insurance: [employer]   Telephone call to patient regarding regarding primary MD.  HIPAA verified with patient. Explained reason for call. Patient states he had to call the doctor about ordering some medicine.  Patient reports he receives his medication from [facility] mail order.  Patient states he is able to afford his medications. Patient states he thinks he is taking his medication correctly.   RNCM discussed and offered [facility] care management services. Patient verbally agreed to follow up with [facility] pharmacist.  Patient declined any additional [facility] care management services.   ASSESSMENT:  Per primary MD referral, patient needs pharmacy consult due to medication discrepancies.   Note per primary MD, Dr. Mariea Clonts on 04/18/18 "Pt is not taking meds as prescribed by Dr. Einar Gip.  List below is what he has in his bag of medications.  He also had two bottles of valsartan/hctz with very few pills in them in the bag.  He does not have any bidil, zetia or crestor.    BP elevated, but has never been normal at a visit probably because each of his providers things he's taking different medications."  PLAN:  RNCM will refer patient to [facility] care management pharmacist.  RNCm will send patient [facility] care management brochure/ magnet  Quinn Plowman RN,BSN,CCM [facility] Telephonic  [phone]

## 2018-04-28 ENCOUNTER — Telehealth: Payer: Self-pay | Admitting: Pharmacist

## 2018-04-28 NOTE — Patient Outreach (Addendum)
[facility]) Care Management  [place]   04/28/2018  [name] 02-03-1941 [id number]  Subjective: Patient was called regarding medication management and adherence per referral from Dr. Mariea Clonts. HIPAA identifiers were obtained.   Patient is a 77 year old male with multiple medical conditions including but not limited to:  Hypertension, CKD stage III, glaucoma, peripheral arteri al occlusive disease, lower extremity claudication, and CKD stage III.  Patient reported managing his medications on his own. He uses Patent attorney.   Objective:    Encounter Medications: Outpatient Encounter Medications as of 04/28/2018  Medication Sig  . amLODipine (NORVASC) 10 MG tablet Take one tablet by mouth once daily for blood pressure  . aspirin 81 MG tablet Take 81 mg by mouth daily.    Marland Kitchen CALTRATE 600+D 600-800 MG-UNIT TABS TAKE 1 TABLET TWICE DAILY  . doxazosin (CARDURA) 4 MG tablet Take one tablet by mouth once daily for blood pressure  . furosemide (LASIX) 20 MG tablet Take one tablet by mouth every morning and take one tablet by mouth every evening  . labetalol (NORMODYNE) 300 MG tablet Take 300 mg by mouth 3 (three) times daily.   Marland Kitchen latanoprost (XALATAN) 0.005 % ophthalmic solution Place 1 drop into both eyes at bedtime.   Marland Kitchen losartan (COZAAR) 50 MG tablet Take 1 tablet (50 mg total) by mouth daily.  . pravastatin (PRAVACHOL) 80 MG tablet TAKE 1 TABLET ONE TIME DAILY TO CONTROL CHOLESTEROL  . cilostazol (PLETAL) 100 MG tablet Take 100 mg by mouth 2 (two) times daily.   No facility-administered encounter medications on file as of 04/28/2018.     Functional Status: In your present state of health, do you have any difficulty performing the following activities: 04/18/2018  Hearing? N  Vision? N  Difficulty concentrating or making decisions? N  Walking or climbing stairs? N  Dressing or bathing? N  Doing errands, shopping? N  Preparing Food and eating ?  N  Using the Toilet? N  In the past six months, have you accidently leaked urine? N  Do you have problems with loss of bowel control? N  Managing your Medications? N  Managing your Finances? N  Housekeeping or managing your Housekeeping? N  Some recent data might be hidden    Fall/Depression Screening: Fall Risk  04/18/2018 11/18/2017 07/22/2017  Falls in the past year? No No No   PHQ 2/9 Scores 04/27/2018 04/18/2018 11/18/2017 07/22/2017 03/10/2017 11/02/2016 06/29/2016  PHQ - 2 Score 0 0 0 0 0 0 0      Assessment: Patient's medications were reviewed via telephone:   Drugs sorted by system:  Cardiovascular: Amlodipine, Aspirin, Doxazosin, Furosemide, Labetalol, Pravastatin, Losartan,Cilostazol (reported did not have)  Vitamins/Minerals: Calcium 600 + D   Misc: Latanoprost   Medication Review Findings: Dr. Cyndi Lennert note stated the patient did not have the following medications which are also not on his current medication list:  bidil, Zetia or Crestor.   Cilostazol was listed on the patient's medication list but he said he did not have it in his possession.   Dr. Irven Shelling office was called for clarification of the patient's medication list.  His nurse said she would fax over his medication list from their office.  Plan: -Home visit scheduled for 05/06/18 -Route note to PCP. -Review the list from Dr. Irven Shelling office.  Elayne Guerin, PharmD, [place] Clinical Pharmacist [phone]

## 2018-05-06 ENCOUNTER — Other Ambulatory Visit: Payer: Self-pay | Admitting: Pharmacist

## 2018-05-06 ENCOUNTER — Other Ambulatory Visit: Payer: Self-pay | Admitting: Pharmacy Technician

## 2018-05-06 NOTE — Patient Outreach (Signed)
[facility]) Care Management  05/06/2018  [name] 1940/09/20 [id number]   Received [name] patient assistance referral from [place] for [employer]. [facility] RPh Denyse Amass delivered completed patient portion of application to me after home visit. Mailed provider portion to Dr. Adrian Prows.  Will submit completed application to [name] once provider portion is mailed back.  Bush Murdoch P. [name], [place] Management [phone]

## 2018-05-06 NOTE — Patient Outreach (Signed)
[name]  [name]   05/06/2018  [name] 10-17-1940 [id number]  Subjective: Home visit completed at patient's home. HIPAA identifiers were obtained. Patient is a 77 year old male with multiple medical conditions including but not limited to:  Hypertension, CKD stage III, glaucoma, peripheral arterial occlusive disease, lower extremity claudication, and CKD stage III.  Patient manages his medications on his own and does not use a pill box. He receives his medications from [employer].  Objective:   Encounter Medications: Outpatient Encounter Medications as of 05/06/2018  Medication Sig  . amLODipine (NORVASC) 10 MG tablet Take one tablet by mouth once daily for blood pressure  . aspirin 81 MG tablet Take 81 mg by mouth daily.    Marland Kitchen CALTRATE 600+D 600-800 MG-UNIT TABS TAKE 1 TABLET TWICE DAILY  . cilostazol (PLETAL) 100 MG tablet Take 100 mg by mouth 2 (two) times daily.  Marland Kitchen doxazosin (CARDURA) 4 MG tablet Take one tablet by mouth once daily for blood pressure  . furosemide (LASIX) 20 MG tablet Take one tablet by mouth every morning and take one tablet by mouth every evening  . labetalol (NORMODYNE) 300 MG tablet Take 300 mg by mouth 3 (three) times daily.   Marland Kitchen losartan (COZAAR) 50 MG tablet Take 1 tablet (50 mg total) by mouth daily.  . pravastatin (PRAVACHOL) 80 MG tablet TAKE 1 TABLET ONE TIME DAILY TO CONTROL CHOLESTEROL  . latanoprost (XALATAN) 0.005 % ophthalmic solution Place 1 drop into both eyes at bedtime.    No facility-administered encounter medications on file as of 05/06/2018.     Functional Status: In your present state of health, do you have any difficulty performing the following activities: 04/18/2018  Hearing? N  Vision? N  Difficulty concentrating or making decisions? N  Walking or climbing stairs? N  Dressing or bathing? N  Doing errands, shopping? N  Preparing Food and eating ? N  Using the  Toilet? N  In the past six months, have you accidently leaked urine? N  Do you have problems with loss of bowel control? N  Managing your Medications? N  Managing your Finances? N  Housekeeping or managing your Housekeeping? N  Some recent data might be hidden    Fall/Depression Screening: Fall Risk  04/18/2018 11/18/2017 07/22/2017  Falls in the past year? No No No   PHQ 2/9 Scores 04/27/2018 04/18/2018 11/18/2017 07/22/2017 03/10/2017 11/02/2016 06/29/2016  PHQ - 2 Score 0 0 0 0 0 0 0      Assessment: Patient's medication bottles were reviewed in his home.  Drugs sorted by system:  Cardiovascular: Amlodipine, Aspirin, Doxazosin, Furosemide, Labetalol, Pravastatin, Losartan,Cilostazol (reported did not have)  Vitamins/Minerals: Calcium 600 + D --out of this  Misc: Latanoprost (did no have) Timolol Maleate  Medication Review Findings:  Cilostazol was listed on the patient's medication list but he said he did not have it in his possession.  Dr. Irven Shelling office was called for clarification of the patient's medication list. The last office visit from Dr. Einar Gip and the most recent medication list (faxed from Nurse, Ellison Hughs), reveal the following medications the patient should be taking but is not:  Cilostazol 100 mg 1 tablet twice daily Bidil 20-37 1 tablet three times daily Ezetimibe '10mg'$  1 tablet daily Rosuvastatin '10mg'$  1 tablet daily--Patient is taking Pravastatin '80mg'$  1 tablet daily   [place] was called: Cilostazol '100mg'$  was not filled at [facility] will request  a  refill from Dr. Gwenette Greet $0 Confirmation number: [id number]  Patient had refills on: Bidil 20/37.5- #270--estimated copay--$396  Ezetimibe '10mg'$  #30--estimated copay $62.00  Patient is still under his deductible period (meaning he has not met)- his deductible is $450   Patient said he could not afford to pay for the Ezetimibe or Bidil right now due to cost.     Patient assistance forms completed on the patient's behalf for [place] ---and will be sent to Dr. Irven Shelling office  BIDIL is provided by [employer] but the patient is over income.  It may be prudent to prescribe isosorbide and hydralazine seperately as both medicaitons are tier 1 medications and would have a $0 copay.  [employer] was asked about OTC benefit but the patient's plan does not have OTC coverage.  Issues unresolved: -Bidil--patient cannot afford and is over income for patient assistance  -Rosuvastatin-patient is taking Pravastatin '80mg'$  (equipotent)  -Adherence-patient has multiple bottles of some of his medications which shows he is missing some doses although he denies missing.   Plan: Route note to PCP Route note to Dr. Einar Gip Deliver signed application to [name] ([place] Technician) to begin the patient assistance process for Zetia. Follow up on application in 2 weeks. Attempt to reconcile med list issues and provide the patient with a clean medication list.  Patient was encouraged to speak with his insurance sales person for next year to look for a plan with a lower deductible and one with OTC benefits.  Elayne Guerin, PharmD, [place] Clinical Pharmacist [phone]

## 2018-05-13 ENCOUNTER — Other Ambulatory Visit: Payer: Self-pay | Admitting: Pharmacist

## 2018-05-13 ENCOUNTER — Ambulatory Visit: Payer: Self-pay | Admitting: Pharmacist

## 2018-05-13 NOTE — Patient Outreach (Addendum)
[place] [facility]) Care Management  05/13/2018  [name] 1941-03-08 [id number]   Dr. Irven Shelling office was called to follow up on BiDil.  Patient cannot afford BiDil and Dr. Irven Shelling office was asked to send in new prescriptions for both Hydralazine and Isosorbide MN.  Dr. Einar Gip sent an inbasket message stating that he would send in the prescriptions separately. Both medications should be tier 1 medications and have a $0.00 copay.  Patient was called. HIPAA identifiers were obtained.  Patient confirmed he received Cilostazol from the [facility].  He was instructed to be on the "look out" for the two medications Dr. Einar Gip sent in today.  Medications Reviewed Today    Reviewed by Elayne Guerin, [facility] (Pharmacist) on 05/13/18 at 0955  Med List Status: <None>  Medication Order Taking? Sig Documenting Provider Last Dose Status Informant  amLODipine (NORVASC) 10 MG tablet [id number] Yes Take one tablet by mouth once daily for blood pressure Reed, [name] L, DO Taking Active   aspirin 81 MG tablet [id number] Yes Take 81 mg by mouth daily.   [provider] Taking Active Self  CALTRATE 600+D 600-800 MG-UNIT TABS [id number] Yes TAKE 1 TABLET TWICE DAILY Reed, [name] L, DO Taking Active Self  cilostazol (PLETAL) 100 MG tablet [id number] Yes Take 100 mg by mouth 2 (two) times daily. [provider] Taking Active            Med Note Elayne Guerin   Fri May 13, 2018  9:33 AM)    doxazosin (CARDURA) 4 MG tablet [id number] Yes Take one tablet by mouth once daily for blood pressure Reed, [name] L, DO Taking Active   ezetimibe (ZETIA) 10 MG tablet [id number] No Take 10 mg by mouth daily. Adrian Prows, MD Not Taking Active            Med Note Elayne Guerin   Fri May 06, 2018  3:28 PM) Not taking due to cost--patient assistance process started  furosemide (LASIX) 20 MG tablet [id number] Yes Take one tablet by mouth every morning and take one tablet by mouth every  evening Reed, [name] L, DO Taking Active   hydrALAZINE (APRESOLINE) 25 MG tablet [id number] No Take 25 mg by mouth 3 (three) times daily. Adrian Prows, MD Not Taking Active   isosorbide mononitrate (IMDUR) 120 MG 24 hr tablet [id number] No Take 120 mg by mouth daily. Adrian Prows, MD Not Taking Active   labetalol (NORMODYNE) 300 MG tablet [id number] Yes Take 300 mg by mouth 3 (three) times daily.  [provider] Taking Active   latanoprost (XALATAN) 0.005 % ophthalmic solution [id number] Yes Place 1 drop into both eyes at bedtime.  [provider] Taking Active Self           Med Note Elayne Guerin   Fri May 06, 2018  3:29 PM) Patient did not have this in his possession   losartan (COZAAR) 50 MG tablet [id number] Yes Take 1 tablet (50 mg total) by mouth daily. Reed, [name] L, DO Taking Active   pravastatin (PRAVACHOL) 80 MG tablet [id number] Yes TAKE 1 TABLET ONE TIME DAILY TO CONTROL CHOLESTEROL Reed, [name] L, DO Taking Active Self  timolol (BETIMOL) 0.5 % ophthalmic solution [id number] Yes Place 1 drop into both eyes daily. [provider] Taking Active            Plan: Call patient back in 1 week to complete a medication review over the phone. Send patient a  new updated medication list. (No further home visits to the patient's home as Dr. Einar Gip stated the patient had a bedbug on him at his last office visit and was recommended to get his house fumigated).

## 2018-05-16 ENCOUNTER — Other Ambulatory Visit: Payer: Self-pay | Admitting: Pharmacy Technician

## 2018-05-16 NOTE — Patient Outreach (Signed)
[place] [facility]) Care Management  05/16/2018  David REIERSON 02/12/1941 [id number]    Addendum: Laqueta Linden called back from Dr. Irven Shelling office and said that they had received the patient assistance application. She stated the application was on his desk waiting for him to fill it out and that she had no questions about the process. Will follow up in 3-5 business days if not received back.  [name] P. [name], [name] Management [phone]

## 2018-05-16 NOTE — Patient Outreach (Signed)
[place] [facility]) Care Management  05/16/2018  JAMESYN LINDELL 07-Oct-1940 [id number]   Follow up call to Dr. Irven Shelling office in regards to [name] patient Librarian, academic for [employer]. Spoke to [facility] who transferred me to the nurse's line. Left voicemail regarding the receipt and the status of the patient assistance application, provider portion.   Will followup in 2-3 business days if call has not been returned.  Davis Ambrosini P. Noma Quijas, [place] Management [phone]

## 2018-05-20 ENCOUNTER — Other Ambulatory Visit: Payer: Self-pay | Admitting: Pharmacist

## 2018-05-20 NOTE — Patient Outreach (Signed)
[place] [facility]) Care Management  05/20/2018  [name] September 15, 1940 [id number]   Patient was called to follow up on medication delivery from [place]. HIPAA identifiers were obtained. Patient said he has not received his hydralazine or isosorbide from [facility] yet. The provider's office confirmed they sent the prescriptions on 05/13/18.  [employer] usually takes 7-10 business days to process and mail prescriptions to patients.   Plan: Will call patient back in 3-5 business days to verify delivery and call [employer] with patient on conference if necessary.  Elayne Guerin, PharmD, [place] Clinical Pharmacist [phone]

## 2018-05-23 ENCOUNTER — Other Ambulatory Visit: Payer: Self-pay | Admitting: Pharmacy Technician

## 2018-05-23 NOTE — Patient Outreach (Signed)
[place] [facility]) Care Management  05/23/2018  [name] 1941/06/10 [id number]   Successful call placed to Dr. Irven Shelling office. Followup call placed to confirm receipt of the [name] patient assistance application. Spoke to [place] who states the application is unable to be located by her or other staff members. She requests that the application be mailed back out to the office since it can not be faxed.  Prepared provider portion of application again.  Will followup in 2-3 business days to confirm the application has been received.  Jerae Izard P. Tryce Surratt, [place] Management [phone]

## 2018-05-25 ENCOUNTER — Other Ambulatory Visit: Payer: Self-pay | Admitting: Pharmacist

## 2018-05-25 ENCOUNTER — Ambulatory Visit: Payer: Self-pay | Admitting: Pharmacist

## 2018-05-25 NOTE — Patient Outreach (Signed)
[place] [facility]) Care Management  05/25/2018  [name] [birth year] [id number]   Called patient to check on delivery of isosorbide and hydralazine via mail order pharmacy. Unfortunately, he did not answer his phone. HIPAA compliant message was left on his voicemail.  Plan: Call patient back in 3-5 business days.   Elayne Guerin, PharmD, [place] Clinical Pharmacist [phone]

## 2018-05-27 ENCOUNTER — Other Ambulatory Visit: Payer: Self-pay | Admitting: Pharmacy Technician

## 2018-05-27 NOTE — Patient Outreach (Signed)
[place] [facility]) Care Management  05/27/2018  [name] 20-Jun-1941 [id number]    Unsuccessful outreach call to Dr. Irven Shelling office to inquire about the provider portion of [name] patient assistance application.  Rise Paganini answered the phone and transferred me to [facility] where a message was left inquiring about the provider's portion of the PAP that was mailed a 2nd time on 05/23/18.  Will followup in 2-3 business days if the call is not returned.  Tedd Cottrill P. Zykee Avakian, [place] Management [phone]

## 2018-05-27 NOTE — Patient Outreach (Signed)
[place] [facility]) Care Management  05/27/2018  TERRYL MOLINELLI May 30, 1941 [id number]   Addendum: Laqueta Linden returned my call concering Dr. Irven Shelling portion of the [name] patient assistance application. She said the application was mailed back on 05/25/18.  Will submit application to [name] when the providers portion is received.  Harjot Zavadil P. Sahasra Belue, [place] Management [phone]

## 2018-05-30 ENCOUNTER — Other Ambulatory Visit: Payer: Self-pay | Admitting: Pharmacist

## 2018-05-30 ENCOUNTER — Other Ambulatory Visit: Payer: Self-pay | Admitting: Pharmacy Technician

## 2018-05-30 NOTE — Patient Outreach (Signed)
[place] [facility]) Care Management  05/30/2018  LEVY CEDANO Sep 28, 1940 [id number]   Received letter from [place] requesting a medication list be mailed to the patient's home.  Prepared medication list to be mailed 05/30/18.  Shant Hence P. Scotti Motter, [place] Management [phone]

## 2018-05-30 NOTE — Patient Outreach (Addendum)
[place] [facility]) Care Management  05/30/2018  [name] June 10, 1941 [id number]   Patient was called to follow up on delivery of his medications from [facility]. HIPAA identifiers were obtained.    Patient confirmed he received Hydralazine and Isosorbide MN from [place].  Patient's medications were reviewed via telephone.  Medications Reviewed Today    Reviewed by Elayne Guerin, [facility] (Pharmacist) on 05/13/18 at 0955  Med List Status: <None>  Medication Order Taking? Sig Documenting Provider Last Dose Status Informant  amLODipine (NORVASC) 10 MG tablet [id number] Yes Take one tablet by mouth once daily for blood pressure Reed, [name] L, DO Taking Active   aspirin 81 MG tablet [id number] Yes Take 81 mg by mouth daily.   [provider] Taking Active Self  CALTRATE 600+D 600-800 MG-UNIT TABS [id number] Yes TAKE 1 TABLET TWICE DAILY Reed, [name] L, DO Taking Active Self  cilostazol (PLETAL) 100 MG tablet [id number] Yes Take 100 mg by mouth 2 (two) times daily. [provider] Taking Active            Med Note Elayne Guerin   Fri May 13, 2018  9:33 AM)    doxazosin (CARDURA) 4 MG tablet [id number] Yes Take one tablet by mouth once daily for blood pressure Reed, [name] L, DO Taking Active   ezetimibe (ZETIA) 10 MG tablet [id number] No Take 10 mg by mouth daily. Adrian Prows, MD Not Taking Active            Med Note Elayne Guerin   Fri May 06, 2018  3:28 PM) Not taking due to cost--patient assistance process started  furosemide (LASIX) 20 MG tablet [id number] Yes Take one tablet by mouth every morning and take one tablet by mouth every evening Reed, [name] L, DO Taking Active   hydrALAZINE (APRESOLINE) 25 MG tablet [id number] No Take 25 mg by mouth 3 (three) times daily. Adrian Prows, MD Not Taking Active   isosorbide mononitrate (IMDUR) 120 MG 24 hr tablet [id number] No Take 120 mg by mouth daily. Adrian Prows, MD Not Taking Active    labetalol (NORMODYNE) 300 MG tablet [id number] Yes Take 300 mg by mouth 3 (three) times daily.  [provider] Taking Active   latanoprost (XALATAN) 0.005 % ophthalmic solution [id number] Yes Place 1 drop into both eyes at bedtime.  [provider] Taking Active Self           Med Note Elayne Guerin   Fri May 06, 2018  3:29 PM) Patient did not have this in his possession   losartan (COZAAR) 50 MG tablet [id number] Yes Take 1 tablet (50 mg total) by mouth daily. Reed, [name] L, DO Taking Active   pravastatin (PRAVACHOL) 80 MG tablet [id number] Yes TAKE 1 TABLET ONE TIME DAILY TO CONTROL CHOLESTEROL Reed, [name] L, DO Taking Active Self  timolol (BETIMOL) 0.5 % ophthalmic solution [id number] Yes Place 1 drop into both eyes daily. [provider] Taking Active           Medication Review Findings:  -Patient did not report having Latanoprost Eye Drops. -Dr. Zenia Resides office was called and it was confirmed the patient should be using both Latanoprost and Timolol Eye drops -According to [employer] data, Latanoprost Eye Drops were filled 05/17/18 for a 90 day supply.  -Patient was called back.  He reported having a bag medications that were not opened yet from [facility]. The Latanoprost was in the bag but the patient had  not been using both.  -Patient was properly educated and a medication list will be mailed to his home.   -Patient was unable to afford Ezetimibe (Zetia).  As such, patient assistance forms were mailed to Dr. Irven Shelling office for completion.  Unfortunately, we have not received the forms back from Dr. Irven Shelling office.    -As of today, patient has all of his medications (with the exception of ezetimibe) and understands what he should be taking and how.    Plan: Follow up with the patient in 7-10 business days. Route note to Dr. Einar Gip and Dr. Estil Daft, PharmD, [facility] Clinical Pharmacist [phone]

## 2018-06-07 ENCOUNTER — Other Ambulatory Visit: Payer: Self-pay | Admitting: Pharmacist

## 2018-06-07 NOTE — Patient Outreach (Addendum)
[name]) Care Management  06/07/2018  [name] Mar 20, 1941 [id number]   Patient was called to see if he received the medication list I sent to him.  HIPAA identifiers were obtained. Patient confirmed he received his medication list and that he is using his latanoprost eye drops in addition to the Timolol eye drops.  Dr. Einar Gip sent a message back that he signed the [name] application that was left at his office last week.   Plan: Check on status of Zetia from [name] in 2 weeks.  Elayne Guerin, PharmD, [place] Clinical Pharmacist [phone]

## 2018-06-22 ENCOUNTER — Ambulatory Visit: Payer: Self-pay | Admitting: Pharmacist

## 2018-06-22 ENCOUNTER — Other Ambulatory Visit: Payer: Self-pay | Admitting: Pharmacist

## 2018-06-22 NOTE — Patient Outreach (Signed)
[place] [facility]) Care Management  06/22/2018  [name] 1941/04/21 [id number]   Patient was called to follow up on medication adherence and assistance. Unfortunately, he did not answer the phone. HIPAA compliant message was left on his voicemail.  Plan: Route note to [employer] for follow up on [employer]. Call patient back in 10-14 days   Elayne Guerin, PharmD, [place] Clinical Pharmacist [phone]

## 2018-06-27 ENCOUNTER — Encounter: Payer: Self-pay | Admitting: Internal Medicine

## 2018-06-28 NOTE — Patient Outreach (Signed)
[place] [facility]) Care Management  06/28/2018  [name] 08-Jul-1941 [id number]   Patient was called to follow up on medication assistance and adherence. Unfortunately, he did not answer the phone. HIPAA compliant message was left on his voicemail.  Plan: Call patient back in 5-10 business days.   Elayne Guerin, PharmD, [place] Clinical Pharmacist [phone]

## 2018-07-01 ENCOUNTER — Other Ambulatory Visit: Payer: Self-pay | Admitting: Pharmacy Technician

## 2018-07-01 NOTE — Patient Outreach (Signed)
[place] [facility]) Care Management  07/01/2018  [name] 11/12/1940 [id number]   Received all necessary paperwork, documents and signatures for patient assistance application for Zetia.  Submitted completed application via mail to [facility] patient assistance.  Will followup with company in 10-14 business days to check on status of application.  [name] [name], [place] Management [phone]

## 2018-07-08 ENCOUNTER — Other Ambulatory Visit: Payer: Self-pay | Admitting: Pharmacist

## 2018-07-08 NOTE — Patient Outreach (Signed)
[place] [facility]) Care Management  07/08/2018  [name] [birth year] [id number]   Patient was called to follow up on his medication list and to be sure his medications are being delivered from [facility] as requested. Patient confirmed he received his medication list although he said he could not find it when we spoke this morning.  He also confirmed has been receiving his medications from [place] at no cost.    Patient Assistance Forms have been sent to [name] on the patient's behalf for [place].  Plan: Certified Pharmacy Technician, Sharee Pimple [place] will follow up with the patient about Zetia.   Elayne Guerin, PharmD, [place] Clinical Pharmacist [phone]

## 2018-07-18 ENCOUNTER — Other Ambulatory Visit: Payer: Self-pay | Admitting: Pharmacy Technician

## 2018-07-18 NOTE — Patient Outreach (Signed)
[place] [facility]) Care Management  07/18/2018  [name] Jun 15, 1941 [id number]   Care coordination call placed to [name] patient assistance in regards to patient's application for Zetia.  Spoke to [place] who said the application was received on 07/13/18 and the attestation letter mailed the same day.  Spoke to patient, HIPAA identifiers verified. Informed him to be on the lookout for correspondence from [facility] patient assistance. Informed him to call me when we received the letter so we could go over the letter and questions together. Patient verbalized understanding.  Will followup with patient in 10-14 business days if call has not been returned.  [name], [place] Management [phone]

## 2018-07-20 ENCOUNTER — Other Ambulatory Visit: Payer: Self-pay | Admitting: Pharmacist

## 2018-07-20 ENCOUNTER — Telehealth: Payer: Self-pay | Admitting: *Deleted

## 2018-07-20 ENCOUNTER — Other Ambulatory Visit: Payer: Self-pay | Admitting: Pharmacy Technician
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
## 2018-07-20 NOTE — Telephone Encounter (Signed)
Patient called and left message on Clinical Intake requesting to make an appointment for dizziness.   Tried calling patient back and LMOM to return call.

## 2018-07-20 NOTE — Patient Outreach (Signed)
[name] Care Management  07/20/2018  [name] Sep 21, 1940 [id number]    Successful outreach call placed to patient in regards to a voice mail that was left.  [name] answered the phone, HIPPA identifiers verified. [name] was inquiring about his medications. He wanted to know what was the latest medication that he was started on as he was expericing some dizziness. Inquired if patient had reached out to his provider and he stated no that he wanted to talk to a pharmacist first.  Informed [name] that I would have a pharmacist outreach him.  Routed note to [place].  Melaina Howerton P. [name], [place] Management [phone]

## 2018-07-21 ENCOUNTER — Ambulatory Visit (INDEPENDENT_AMBULATORY_CARE_PROVIDER_SITE_OTHER): Payer: Medicare PPO | Admitting: Internal Medicine

## 2018-07-21 ENCOUNTER — Encounter: Payer: Self-pay | Admitting: Internal Medicine

## 2018-07-21 VITALS — BP 200/60 | HR 62 | Temp 98.2°F | Ht 69.0 in | Wt 156.0 lb

## 2018-07-21 DIAGNOSIS — I779 Disorder of arteries and arterioles, unspecified: Secondary | ICD-10-CM | POA: Diagnosis not present

## 2018-07-21 DIAGNOSIS — R42 Dizziness and giddiness: Secondary | ICD-10-CM

## 2018-07-21 DIAGNOSIS — C61 Malignant neoplasm of prostate: Secondary | ICD-10-CM

## 2018-07-21 DIAGNOSIS — I952 Hypotension due to drugs: Secondary | ICD-10-CM

## 2018-07-21 DIAGNOSIS — N183 Chronic kidney disease, stage 3 unspecified: Secondary | ICD-10-CM

## 2018-07-21 DIAGNOSIS — R9431 Abnormal electrocardiogram [ECG] [EKG]: Secondary | ICD-10-CM
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
## 2018-07-21 NOTE — Telephone Encounter (Signed)
LMOM to return call.

## 2018-07-21 NOTE — Patient Instructions (Addendum)
Stop hydralazine Stop isosorbide  Stop dorzolamide  Take all other medications as directed on the attached list. It's ok to take your blood pressure pills at night. Eat regular meals  Take blood pressure twice daily in the morning upon awakening and again in the evening.  Record readings.  Call us on Monday with results.

## 2018-07-21 NOTE — Telephone Encounter (Signed)
Spoke with patient and advised results   

## 2018-07-22 ENCOUNTER — Encounter: Payer: Self-pay | Admitting: *Deleted

## 2018-07-22 ENCOUNTER — Ambulatory Visit: Payer: Self-pay | Admitting: Pharmacist

## 2018-07-22 ENCOUNTER — Other Ambulatory Visit: Payer: Self-pay | Admitting: *Deleted

## 2018-07-22 LAB — BASIC METABOLIC PANEL
BUN/Creatinine Ratio: 28 (calc) — ABNORMAL HIGH (ref 6–22)
BUN: 43 mg/dL — ABNORMAL HIGH (ref 7–25)
CO2: 24 mmol/L (ref 20–32)
Calcium: 9.8 mg/dL (ref 8.6–10.3)
Chloride: 109 mmol/L (ref 98–110)
Creat: 1.52 mg/dL — ABNORMAL HIGH (ref 0.70–1.18)
Glucose, Bld: 91 mg/dL (ref 65–139)
Potassium: 5 mmol/L (ref 3.5–5.3)
Sodium: 139 mmol/L (ref 135–146)

## 2018-07-22 MED ORDER — PRAVASTATIN SODIUM 80 MG PO TABS: ORAL_TABLET | ORAL | 1 refills | Status: DC

## 2018-07-22 NOTE — Telephone Encounter (Signed)
Patient requested refill

## 2018-07-25 ENCOUNTER — Telehealth: Payer: Self-pay | Admitting: *Deleted

## 2018-07-25 NOTE — Telephone Encounter (Signed)
[name], Caregiver called with patient's BP readings and concerns. I spoke with patient and he gave me verbal consent to speak with [name]. Patient takes BP medications at bedtime.   BP Readings: 07/22/18  146/45  Pulse 72 AM 07/23/18  131/41  Pulse 70 AM       143/41 Pulse 78 PM 07/24/18  161/52  Pulse 88 AM      159/52  Pulse 70 PM 07/25/18  130/42  Pulse 75 AM   Patient stated that He was never told about having Chronic Kidney Disease Stage III but it was on his AVS. He wants to know more about this. Please Advise.

## 2018-07-25 NOTE — Telephone Encounter (Signed)
These blood pressures are mostly good with his new regimen and actually taking his pills.  These are the best I've really seen. He has had chronic kidney disease since before he even became my patient.  His circulation to his kidneys has been poor because of blood pressure and cholesterol being uncontrolled for years (from not taking his meds as directed). This should stabilize if he takes his medication as directed and drinks 6-8 8oz glasses of water each day (avoiding soda and alcohol)

## 2018-07-25 NOTE — Telephone Encounter (Signed)
Notified and agreed.  

## 2018-07-28 ENCOUNTER — Ambulatory Visit: Payer: Self-pay | Admitting: Pharmacist

## 2018-07-28 ENCOUNTER — Other Ambulatory Visit: Payer: Self-pay | Admitting: Pharmacist

## 2018-07-28 NOTE — Patient Outreach (Signed)
[place] [facility]) Care Management  07/28/2018  [name] 01/20/1941 [id number]  Patient was called regarding medication management with blood pressure medications. Unfortunately, he did not answer the phone. HIPIAA compliant message was left on the patient's voicemail. Review of his chart revealed several blood pressure medications were discontinued.   Plan: Will follow up with the patient in 3-4 business days.   Elayne Guerin, PharmD, [place] Clinical Pharmacist [phone]

## 2018-08-01 ENCOUNTER — Ambulatory Visit: Payer: Self-pay | Admitting: Pharmacist

## 2018-08-01 ENCOUNTER — Other Ambulatory Visit: Payer: Self-pay | Admitting: Pharmacy Technician

## 2018-08-01 ENCOUNTER — Other Ambulatory Visit: Payer: Self-pay | Admitting: Pharmacist

## 2018-08-01 NOTE — Patient Outreach (Signed)
[place] [facility]) Care Management  08/01/2018  TYSHON FANNING 1941/06/25 [id number]    Successful outreach call placed to patient in regards to his [name] patient assistance application for Zetia.  Spoke to patient, HIPPA identifiers verified. Mr. Dorer asked that I speak to [name] who helps him with his medication. [name] was unsure if Mr. Melody had received the attestation letter from [employer] and said she would have to look through his mail and call me back. She informed that the patient's dr had discontinued a lot of his medications b/c he was having dizziness. She informs that the patient's dizziness seems to have gotten better. Confirmed patient's caregiver had my name and number.  Will followup with patient in 2-3 business days if call is not returned.  Ave Scharnhorst P. Libby Goehring, [place] Management [phone]

## 2018-08-01 NOTE — Patient Outreach (Signed)
[place] [facility]) Care Management  08/01/2018  [name] June 24, 1941 [id number]   Patient was called to follow up on his dizziness complaints. Unfortunately, he did not answer the phone. HIPAA compliant message was left on his voicemail.  Plan: Call patient back in 7-10 business days.   Elayne Guerin, PharmD, [place] Clinical Pharmacist [phone]

## 2018-08-08 ENCOUNTER — Encounter: Payer: Self-pay | Admitting: Pharmacy Technician

## 2018-08-08 ENCOUNTER — Other Ambulatory Visit: Payer: Self-pay | Admitting: Pharmacy Technician

## 2018-08-08 NOTE — Patient Outreach (Signed)
[facility]) Care Management  08/08/2018  CAIDIN HEIDENREICH 1940/09/17 [id number]    Successful outgoing call placed to patient in regards to [name] patient assistance for Zetia.  Spoke to patient's caregiver, Jonelle Sidle. HIPAA identifiers verified. Inquired if they had found or received the attestation letter that [name] mailed to them on Dec 11. [name] informed that they had looked the house over and could not locate a letter from [employer].  Informed [name] that the whole process would have to be started and she was in agreement to have the process started again. She stated if the letter were to come this week per chance then she would call me. She verified my name and number.  Prepared patient portion to be mailed again. Also prepared provider portion to be mailed to Dr. Einar Gip.  Will followup with both patient and provider in 5-7 business days to confirm receipt of the application.  Monae Topping P. Aditya Nastasi, [place] Management [phone]

## 2018-08-15 ENCOUNTER — Other Ambulatory Visit: Payer: Self-pay | Admitting: Pharmacist

## 2018-08-15 ENCOUNTER — Other Ambulatory Visit: Payer: Self-pay | Admitting: Pharmacy Technician

## 2018-08-15 NOTE — Patient Outreach (Signed)
[facility] [facility]) Care Management  08/15/2018  KSEAN VALE 28-Jun-1941 [id number]  Successful outreach call placed to patient in regards to his medication assistance application for Zetia.  Spoke to Mr. Plantz, HIPPA identifiers verified. Mr. Mehlhoff confirms receiving the application and states it was mailed last week.  Will followup with patient in 10-14 business days if application has not been received back.  Endya Austin P. Aleni Andrus, [place] Management [phone]

## 2018-08-15 NOTE — Patient Outreach (Signed)
[place] [facility]) Care Management  08/15/2018  [name] January 01, 1941 [id number]   Called patient to follow up on his dizziness complaints. HIPAA identifiers were obtained. Patient asked that I speak with his daughter, [name].   Patient is a 78 year old male with multiple medical conditions including but not limited to:  CHF, glaucoma, hyperlipidemia, PAD, hypertension, carotid stenosis, and CKD stage III.  Patient reported being dizzy and running up on a curb while driving.   He was instructed to call his PCP and a note was sent to his PCP as well.  During his PCP visit, his provider discontinued several hypertensive medications     Patient's medication list was reviewed today. He and his daughter report dizziness has subsided and blood pressures are normal.  Medications Reviewed Today    Reviewed by Elayne Guerin, [facility] (Pharmacist) on 08/15/18 at 1016  Med List Status: <None>  Medication Order Taking? Sig Documenting Provider Last Dose Status Informant  amLODipine (NORVASC) 10 MG tablet [id number] Yes Take one tablet by mouth once daily for blood pressure Reed, [name] L, DO Taking Active   aspirin 81 MG tablet [id number] Yes Take 81 mg by mouth daily.   [provider] Taking Active Self  CALTRATE 600+D 600-800 MG-UNIT TABS [id number] Yes TAKE 1 TABLET TWICE DAILY Reed, [name] L, DO Taking Active Self  cilostazol (PLETAL) 100 MG tablet [id number] Yes Take 100 mg by mouth 2 (two) times daily. [provider] Taking Active            Med Note Elayne Guerin   Fri May 13, 2018  9:33 AM)    ezetimibe (ZETIA) 10 MG tablet [id number] No Take 10 mg by mouth daily. Adrian Prows, MD Not Taking Active            Med Note Barbarann Ehlers Jul 21, 2018  2:35 PM)    furosemide (LASIX) 20 MG tablet [id number] Yes Take one tablet by mouth every morning and take one tablet by mouth every evening Reed, [name] L, DO Taking Active   latanoprost (XALATAN) 0.005 %  ophthalmic solution [id number] Yes Place 1 drop into both eyes at bedtime.  [provider] Taking Active Self           Med Note Elayne Guerin   Fri Jul 08, 2018  9:57 AM)    losartan (COZAAR) 50 MG tablet [id number] Yes Take 1 tablet (50 mg total) by mouth daily. Reed, [name] L, DO Taking Active   pravastatin (PRAVACHOL) 80 MG tablet [id number] Yes Take one tablet by mouth once daily to control cholesterol Reed, [name] L, DO Taking Active   timolol (BETIMOL) 0.5 % ophthalmic solution [id number] Yes Place 1 drop into both eyes daily. [provider] Taking Active           [name], CPhT is following the patient for patient assistance with Zetia.  Patient's daughter confirmed she received the application and mailed it back last Friday.  Plan: Sharee Pimple will continue to follow the patient for patient assistance.   Elayne Guerin, PharmD, [place] Clinical Pharmacist [phone]

## 2018-08-18 ENCOUNTER — Ambulatory Visit: Payer: Medicare PPO | Admitting: Internal Medicine

## 2018-08-29 ENCOUNTER — Other Ambulatory Visit: Payer: Self-pay | Admitting: Pharmacy Technician

## 2018-08-29 NOTE — Patient Outreach (Signed)
[place] [facility]) Care Management  08/29/2018  PHYLLIP CLAW 1940-08-04 [id number]   Successful outreach call placed to patient in regards to medication assistance for Zetia through [employer].  Spoke to patient, HIPAA identifiers verified. Mr. Radabaugh then passed the phone over to [place]. [name] verified they had received the application back with the sticky note asking for the additional signature. [name] says they placed it back in the mail on Thursday or Friday of last week.  Will followup with patient in 7-10 business days if application has not been received.  Rosamary Boudreau P. Berlyn Malina, [place] Management [phone]

## 2018-09-01 ENCOUNTER — Ambulatory Visit: Payer: Medicare HMO | Admitting: Internal Medicine

## 2018-09-01 ENCOUNTER — Encounter: Payer: Self-pay | Admitting: Internal Medicine

## 2018-09-01 VITALS — BP 140/60 | HR 71 | Temp 97.9°F | Ht 69.0 in | Wt 157.0 lb

## 2018-09-01 DIAGNOSIS — I5032 Chronic diastolic (congestive) heart failure: Secondary | ICD-10-CM

## 2018-09-01 DIAGNOSIS — N183 Chronic kidney disease, stage 3 unspecified: Secondary | ICD-10-CM

## 2018-09-01 DIAGNOSIS — Z7189 Other specified counseling: Secondary | ICD-10-CM | POA: Diagnosis not present

## 2018-09-01 DIAGNOSIS — C61 Malignant neoplasm of prostate: Secondary | ICD-10-CM

## 2018-09-01 DIAGNOSIS — I779 Disorder of arteries and arterioles, unspecified: Secondary | ICD-10-CM

## 2018-09-01 DIAGNOSIS — R739 Hyperglycemia, unspecified: Secondary | ICD-10-CM

## 2018-09-01 DIAGNOSIS — I10 Essential (primary) hypertension: Secondary | ICD-10-CM

## 2018-09-01 DIAGNOSIS — D631 Anemia in chronic kidney disease: Secondary | ICD-10-CM | POA: Diagnosis not present

## 2018-09-01 MED ORDER — LOSARTAN POTASSIUM 50 MG PO TABS: 100.0000 mg | ORAL_TABLET | Freq: Every day | ORAL | 3 refills | Status: DC

## 2018-09-01 NOTE — Patient Instructions (Addendum)
Increase losartan 50mg  to two tablets in the evening (total of 100mg ) until those are used up.  Then we will send a new prescription for the 100mg  tablets to your pharmacy.  Continue the blood pressure checks twice a day.  If you develop dizziness, let me know.    Be sure to follow up with your urologist about the prostate--Dr. Festus Aloe on [facility].

## 2018-09-01 NOTE — Progress Notes (Signed)
Location:  [facility] clinic Provider:  Jamason Peckham L. Mariea Clonts, D.O., C.M.D.  Goals of Care:  Advanced Directives 04/27/2018  Does Patient Have a Medical Advance Directive? No  Would patient like information on creating a medical advance directive? No - Patient declined     Chief Complaint  Patient presents with  . Medical Management of Chronic Issues    27mth follow-up    HPI: Patient is a 78 y.o. male seen today for medical management of chronic diseases.    He's been using pineapple and insists his bp is better b/c of that not his medications.  He is certain he was taking his medicine when his bp was 200 (was on like 4 drugs).  Claudine is keeping his pillbox in order now. BP now ranging from 902I to 097D systolic over 53G-99M, HR 60s-70s.   He had signed a form about his zetia in the wrong place and that's why he does not have it yet.  It should now be on the way.  He's not drinking alcohol.  Every now and then, he will drink a glass of ginger ale.  Mostly drinks juices.  He is trying to drink 3 bottles per day of water.  He was eating three eggs with grits every other day.  Not eating yolks anymore.    Prostate:  He's not sure when his urology appt is but will check.   Claudine got him compression socks--advised not the stockings due to his arterial circulation being poor.    Reviewed need for advance directives and given living will/hcpoa packet.  Discussed notarization need and general purpose of documents. Past Medical History:  Diagnosis Date  . Anemia   . Cataract    traumatic  . Chronic diastolic heart failure ([place])   . Chronic kidney disease, stage III (moderate) (HCC)   . Depressive disorder, not elsewhere classified   . Depressive disorder, not elsewhere classified   . Elevated prostate specific antigen (PSA)   . Elevated prostate specific antigen (PSA)   . Gingivitis   . Hyperlipidemia   . Hypertension   . Hypertensive kidney disease, benign   . Hypertrophy of prostate  with urinary obstruction and other lower urinary tract symptoms (LUTS)   . 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 Microscopic hematuria   . Nondependent cannabis abuse   . 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 Peripheral vascular disease, unspecified ([place])   . Pure hypercholesterolemia   . Unspecified essential hypertension   . Unspecified late effects of cerebrovascular disease   . Unspecified late effects of cerebrovascular disease   . Unspecified vitamin D deficiency   . Unspecified vitamin D deficiency     Past Surgical History:  Procedure Laterality Date  . CATARACT EXTRACTION    . CORONARY STENT PLACEMENT     2008  . EYE SURGERY     left eye,    cornea repair  . PERIPHERAL VASCULAR CATHETERIZATION N/A 07/28/2016   Procedure: Lower Extremity Angiography;  Surgeon: Adrian Prows, MD;  Location: [facility];  Service: Cardiovascular;  Laterality: N/A;    Allergies  Allergen Reactions  . Penicillins Other (See Comments)    Unknown  Has patient had a PCN reaction causing immediate rash, facial/tongue/throat swelling, SOB or lightheadedness with hypotension: {unknown Has patient had a PCN reaction causing severe rash involving mucus membranes or skin necrosis: unknown Has patient had a  PCN reaction that required hospitalization {unknown Has patient had a PCN reaction occurring within the last 10 years: no If all of the above answers are "NO", then may proceed with Cephalosporin use.    Outpatient Encounter Medications as of 09/01/2018  Medication Sig  . amLODipine (NORVASC) 10 MG tablet Take one tablet by mouth once daily for blood pressure  . aspirin 81 MG tablet Take 81 mg by mouth daily.    Marland Kitchen CALTRATE 600+D 600-800 MG-UNIT TABS TAKE 1 TABLET TWICE DAILY  . cilostazol (PLETAL) 100 MG tablet Take 100 mg by mouth 2 (two) times daily.  Marland Kitchen ezetimibe (ZETIA) 10 MG tablet  Take 10 mg by mouth daily.  . furosemide (LASIX) 20 MG tablet Take one tablet by mouth every morning and take one tablet by mouth every evening  . latanoprost (XALATAN) 0.005 % ophthalmic solution Place 1 drop into both eyes at bedtime.   Marland Kitchen losartan (COZAAR) 50 MG tablet Take 1 tablet (50 mg total) by mouth daily.  . pravastatin (PRAVACHOL) 80 MG tablet Take one tablet by mouth once daily to control cholesterol  . timolol (BETIMOL) 0.5 % ophthalmic solution Place 1 drop into both eyes daily.   No facility-administered encounter medications on file as of 09/01/2018.     Review of Systems:  Review of Systems  Constitutional: Negative for chills, fever and malaise/fatigue.  HENT: Negative for hearing loss.   Eyes: Negative for blurred vision.  Respiratory: Negative for cough and shortness of breath.   Cardiovascular: Positive for leg swelling. Negative for chest pain and palpitations.  Gastrointestinal: Negative for abdominal pain and heartburn.  Genitourinary: Negative for dysuria.  Musculoskeletal: Negative for falls and myalgias.  Skin: Negative for itching.  Neurological: Negative for dizziness, focal weakness, loss of consciousness and headaches.  Psychiatric/Behavioral: Negative for depression and memory loss. The patient is not nervous/anxious and does not have insomnia.     Health Maintenance  Topic Date Due  . TETANUS/TDAP  03/14/1960  . INFLUENZA VACCINE  Completed  . PNA vac Low Risk Adult  Completed    Physical Exam: Vitals:   09/01/18 0759  BP: 140/60  Pulse: 71  Temp: 97.9 F (36.6 C)  TempSrc: Oral  SpO2: 98%  Weight: 157 lb (71.2 kg)  Height: 5\' 9"  (1.753 m)   Body mass index is 23.18 kg/m. Physical Exam Vitals signs reviewed.  Constitutional:      Appearance: Normal appearance.  HENT:     Head: Normocephalic and atraumatic.  Cardiovascular:     Rate and Rhythm: Normal rate and regular rhythm.     Pulses: Normal pulses.     Heart sounds: Murmur  present.  Pulmonary:     Effort: Pulmonary effort is normal.     Breath sounds: Normal breath sounds.  Musculoskeletal: Normal range of motion.  Skin:    General: Skin is warm and dry.  Neurological:     General: No focal deficit present.     Mental Status: He is alert and oriented to person, place, and time.  Psychiatric:        Mood and Affect: Mood normal.    Labs reviewed: Basic Metabolic Panel: Recent Labs    04/18/18 1036 07/21/18 1532  NA 142 139  K 5.0 5.0  CL 110 109  CO2 25 24  GLUCOSE 99 91  BUN 26* 43*  CREATININE 1.10 1.52*  CALCIUM 9.7 9.8   Liver Function Tests: Recent Labs    04/18/18 1036  AST 24  ALT 15  BILITOT 0.4  PROT 7.0   No results for input(s): LIPASE, AMYLASE in the last 8760 hours. No results for input(s): AMMONIA in the last 8760 hours. CBC: Recent Labs    04/18/18 1036  WBC 5.2  NEUTROABS 2,616  HGB 12.7*  HCT 39.3  MCV 89.7  PLT 220   Lipid Panel: Recent Labs    04/18/18 1036  CHOL 188  HDL 52  LDLCALC 117*  TRIG 88  CHOLHDL 3.6   Lab Results  Component Value Date   HGBA1C 6.2 (H) 07/22/2017    Procedures since last visit: No results found.  Assessment/Plan 1. Essential hypertension - cont norvasc 10mg  daily - increase losartan to 100mg  so use up 50mg  by taking two -cont taking at night which has been effective losartan (COZAAR) 50 MG tablet; Take 2 tablets (100 mg total) by mouth daily.  Dispense: 90 tablet; Refill: 3 - CBC with Differential/Platelet; Future  2. Prostate cancer [facility]) -f/u with urology as he reports is scheduled  3. PAOD (peripheral arterial occlusive disease) ([place]) -discouraged tight compression hose due to this, socks ok -cont cilostazol  4. Chronic kidney disease, stage III (moderate) (HCC) -Avoid nephrotoxic agents like nsaids, dose adjust renally excreted meds, hydrate. - CBC with Differential/Platelet; Future - COMPLETE METABOLIC PANEL WITH GFR; Future  5. Chronic diastolic  heart failure (HCC) - cont lasix and cozaar - Lipid panel; Future  6. Anemia in stage 3 chronic kidney disease ([place]) - f/u lab before next visit - CBC with Differential/Platelet; Future  7. Hyperglycemia - f/u lab, I'm a little worried the pineapple juice will bring this up, but he is adamant it has lowered his bp - Hemoglobin A1c; Future  Doing much better overall with daughter managing meds.  Labs/tests ordered:  Orders Placed This Encounter  Procedures  . CBC with Differential/Platelet    Standing Status:   Future    Standing Expiration Date:   09/02/2019  . COMPLETE METABOLIC PANEL WITH GFR    Standing Status:   Future    Standing Expiration Date:   09/02/2019  . Hemoglobin A1c    Standing Status:   Future    Standing Expiration Date:   09/02/2019  . Lipid panel    Standing Status:   Future    Standing Expiration Date:   09/02/2019    Next appt:  12/01/2018 med mgt, fasting labs before  Dmetrius Ambs L. Addyson Traub, D.O. [facility] [address], [place] [address] Cell Phone (Mon-Fri 8am-5pm):  [phone] On Call:  [phone] & follow prompts after 5pm & weekends Office Phone:  [phone] Office Fax:  [phone]

## 2018-09-01 NOTE — ACP (Advance Care Planning) (Signed)
Reviewed reason for ACP and advance directive documents with pt and his daughter, Claudine.  Given [place] advance directive document for completion and notarization.  Recommended a family discussion of his goals of care.  They accepted the document and both plan to complete them.

## 2018-09-02 ENCOUNTER — Other Ambulatory Visit: Payer: Self-pay | Admitting: Pharmacy Technician

## 2018-09-02 NOTE — Patient Outreach (Signed)
[facility]) Care Management  09/02/2018  JODI KAPPES 1940/10/31 [id number]   Received all necessary signed application for [name] patient assistance from both patient and provider so that application could be re submitted to [employer].  Mailed completed application to [employer] patient assistance for [employer].  Will followup with [name] in 10-14 buisness days to confirm receipt of application and subsequent mailing out of attestation letter to patient.  Angelos Wasco P. Burel Kahre, [place] Management [phone]

## 2018-09-15 DIAGNOSIS — R972 Elevated prostate specific antigen [PSA]: Secondary | ICD-10-CM | POA: Diagnosis not present

## 2018-09-19 ENCOUNTER — Other Ambulatory Visit: Payer: Self-pay | Admitting: Pharmacy Technician

## 2018-09-19 NOTE — Patient Outreach (Signed)
[facility]) Care Management  09/19/2018  [name] 1941/02/28 [id number]   Care coordination call placed to [name] patient assistance in regards to Zetia application.  Spoke to [place] at [facility] who says they have not received the application yet that was mailed on 09/02/2018. [name] informed that they are still processing January applications and the applications that were mailed at the end of January are still in process. [name] suggested calling back in 7-10 days.  Will followup with [name] in 14-21 business days to inquire on status of application.  [name] P. [name], [place] Management [phone]

## 2018-09-20 ENCOUNTER — Telehealth: Payer: Self-pay | Admitting: *Deleted

## 2018-09-20 NOTE — Telephone Encounter (Signed)
Patient dropped off DMV Paperwork to evaluate medical fitness to safely operate a motor vehicle.  DMV: [phone] Fax: [phone]  Placed paperwork in Dr. Cyndi Lennert folder to review,fill out and sign.

## 2018-09-21 NOTE — Telephone Encounter (Signed)
The DMV Paperwork from 08/04/2018 was faxed to the [facility] and they did receive it but patient received a letter in the mail with a form stating the "medical documentation you submitted to the Medical Review Program does not contain sufficient data to evaluate your medical fitness to safely operate a motor vehicle."  Form Placed in Dr. Cyndi Lennert folder for review.

## 2018-09-21 NOTE — Telephone Encounter (Signed)
I'm not sure how that's possible considering the extent of the paperwork.  Was it scanned as it should have been?

## 2018-09-21 NOTE — Telephone Encounter (Signed)
Paperwork was previously completed and should have been faxed to the [facility] and should be documented in the chart.

## 2018-09-22 DIAGNOSIS — N400 Enlarged prostate without lower urinary tract symptoms: Secondary | ICD-10-CM | POA: Diagnosis not present

## 2018-09-22 DIAGNOSIS — R972 Elevated prostate specific antigen [PSA]: Secondary | ICD-10-CM | POA: Diagnosis not present

## 2018-09-22 NOTE — Telephone Encounter (Signed)
Paperwork in your folder

## 2018-09-28 ENCOUNTER — Encounter (INDEPENDENT_AMBULATORY_CARE_PROVIDER_SITE_OTHER): Payer: Medicare PPO | Admitting: Ophthalmology

## 2018-09-28 DIAGNOSIS — H35342 Macular cyst, hole, or pseudohole, left eye: Secondary | ICD-10-CM | POA: Diagnosis not present

## 2018-09-28 DIAGNOSIS — I10 Essential (primary) hypertension: Secondary | ICD-10-CM | POA: Diagnosis not present

## 2018-09-28 DIAGNOSIS — H43813 Vitreous degeneration, bilateral: Secondary | ICD-10-CM | POA: Diagnosis not present

## 2018-09-28 DIAGNOSIS — H35033 Hypertensive retinopathy, bilateral: Secondary | ICD-10-CM

## 2018-09-29 ENCOUNTER — Other Ambulatory Visit: Payer: Self-pay | Admitting: Pharmacy Technician

## 2018-09-29 NOTE — Patient Outreach (Signed)
[name]) Care Management  09/29/2018  [name] 15-Jul-1941 [id number]   Care coordination call placed to [name] patient assistance in regards to patient's application for Zetia.  Spoke to Dewy Rose who said they had received and processed the application on 3/73/5789. They had also subsequently mailed out the attestation letter on 09/27/2018. Mary informed that the attestation letter usually takes between 7-10 business days to be received at the patient's home.  Will followup with patient today so that he can be expecting the attestation letter.  Landrey Mahurin P. Laron Angelini, [place] Management [phone]

## 2018-09-29 NOTE — Patient Outreach (Signed)
[facility]) Care Management  09/29/2018  [name] 04/25/1941 [id number]   ADDENDUM  Successful outreach call placed to patient in regards to [name] application for [place].  Spoke to patient, HIPAA identifiers verified. [name] said I could speak to [place]. [name] verified HIPAA identifiers. Informed [name] that [name] had finally processed [name] application and had mailed out the attestation letter on 09/27/2018. Informed her that it usually takes 7-10 business days to arrive to his home. Informed [name] to call me when she receives it so we could go over it together. [name] verbalized understanding and confirmed my number by repeating it back to me.  Will followup with patient in 10-14 business days if call has not been returned.  Mayre Bury P. Jairen Goldfarb, [place] Management [phone]

## 2018-09-30 NOTE — Telephone Encounter (Signed)
DMV Paperwork faxed to [facility] [phone]  Sent original to scanning.

## 2018-10-04 ENCOUNTER — Other Ambulatory Visit: Payer: Self-pay | Admitting: Pharmacy Technician

## 2018-10-04 NOTE — Patient Outreach (Signed)
[name] [facility]) Care Management  10/04/2018  [name] 10-28-1940 [id number]   Successful outreach call placed to patient in regards to [name] application for [place].  Spoke to patient who then handed the phone over to [name], HIPAA identifiers verified.  [name] informed me that they had received the attestation form from [employer]. We went over the form together. [name] verbalized understanding and would have the patient sign and date the form. Informed [name] to put all documents that they sent to her back in the enveloped they provider. She verbalized understanding. Informed [name] that I would followup with the company and provide updates as they are received.  Will followup with [name] in 7-10 business days to inquire if they have received the attestation letter back.  Kileen Lange P. Ward Boissonneault, [place] Management [phone]

## 2018-10-14 ENCOUNTER — Other Ambulatory Visit: Payer: Self-pay | Admitting: Pharmacy Technician

## 2018-10-14 NOTE — Patient Outreach (Signed)
[facility]) Care Management  10/14/2018  [name] 1941/02/17 [id number]   Care coordination call placed to [name] patient assistance in regards to Zetia application.  Spoke to [name] who said patient had been APPROVED 10/12/2018-08/03/2019. Per [name] she informed that it takes about 21 business days total for medication to arrive at patient's home as it takes 2-3 business days for pharmacy to receive request, 7-10 business days for pharmacy to process and ship via [place]. It will then arrive to patients home. She would not give a time frame as it depends on the postal carrier in each city.  Will followup with patient in 14-21 business days to inquire if medication has been received.  [name] P. Gricel Copen, [place] Management [phone]

## 2018-10-25 ENCOUNTER — Other Ambulatory Visit: Payer: Self-pay | Admitting: Pharmacy Technician

## 2018-10-25 ENCOUNTER — Telehealth: Payer: Self-pay | Admitting: *Deleted

## 2018-10-25 DIAGNOSIS — I10 Essential (primary) hypertension: Secondary | ICD-10-CM

## 2018-10-25 MED ORDER — LOSARTAN POTASSIUM 50 MG PO TABS: 100.0000 mg | ORAL_TABLET | Freq: Every day | ORAL | 1 refills | Status: DC

## 2018-10-25 MED ORDER — LOSARTAN POTASSIUM 100 MG PO TABS: 100.0000 mg | ORAL_TABLET | Freq: Every day | ORAL | 1 refills | Status: DC
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
## 2018-10-25 NOTE — Telephone Encounter (Signed)
At this point, he needs to be staying home so he doesn't get covid-19.  He definitely does not need to be out driving around.  We discussed how he should not drive at his last appointment--nothing has changed.  His daughter has been helping with his meds and bringing him to appts.

## 2018-10-25 NOTE — Telephone Encounter (Signed)
Patient notified and stated that He STILL wants to talk with you regarding this.   Patient Cell #: [phone]

## 2018-10-25 NOTE — Telephone Encounter (Signed)
Patient requested Refill. Faxed.   Patient dropped off letter addressed to Dr. Mariea Clonts. 1. Requesting a copy of the [place] DMV forms that was sent to the Medical Review Division.   2. Patient is requesting a letter stating that all the medications he was taking caused dizziness, and his medications has been modified and to date he has not had any dizziness.   3.Also wanted Refill of his Losartan 100mg  faxed to the pharmacy.    I have attached the DMV form to the letter and Faxed in the Refill for the Losartan and placed the Letter in Dr. Cyndi Lennert folder to review.

## 2018-10-25 NOTE — Telephone Encounter (Signed)
Patient called back and stated that he wants an appointment with you to discuss his medications and his driving. Stated he wants to speak with you directly regarding this. Stated that it can be by phone. Please Advise.

## 2018-10-25 NOTE — Patient Outreach (Signed)
[facility]) Care Management  10/25/2018  [name] October 08, 1940 [id number]  Successful outreach call placed to patient in regards to his [name] patient assistance application for Zetia.  Spoke to patient who then passed the phone over to [name], HIPAA identifiers verified.  Inquired if [name] had received his Zetia in the mail from the PAP. [name] informed he had received a 90 days supply. Discussed with [name] how to obtain his refills for that medication and she verbalized understanding.   [name] inquired if I could reach out to Dr. Cyndi Lennert office concerning his refill for Losartan. She informed that [facility] had told her patient was on his last refill and she had requested it be sent in by the provider's office. She also requested that it be sent in for 100mg  po qd instead of 50mg , 2 po qd.  Care coordination call placed to Dr. Cyndi Lennert office. Spoke to [name] who said she canceled the refill at [facility] and sent in the refill to [facility] as Losartan 100mg  po qd.  Called [name] back, spoke to [name], HIPAA identifiers verified. Informed [name] that the provider's office had taken care of this for [name].  Inquired if she had any other questions or concerns regarding patient assistance to which she replied no. Confirmed patient had name and number for future reference.  Will remove myself from care team and route note to [place] for case closure as patient assistance is complete.  [name] P. Marlita Keil, [place] Management [phone]

## 2018-10-25 NOTE — Telephone Encounter (Signed)
Rx sent to [facility] for Losartan 100mg .  Sharee Pimple Notified.

## 2018-10-31 ENCOUNTER — Encounter: Payer: Self-pay | Admitting: Internal Medicine

## 2018-10-31 ENCOUNTER — Other Ambulatory Visit: Payer: Self-pay

## 2018-10-31 ENCOUNTER — Ambulatory Visit (INDEPENDENT_AMBULATORY_CARE_PROVIDER_SITE_OTHER): Payer: Medicare HMO | Admitting: Internal Medicine

## 2018-10-31 DIAGNOSIS — N183 Chronic kidney disease, stage 3 unspecified: Secondary | ICD-10-CM

## 2018-10-31 DIAGNOSIS — I5032 Chronic diastolic (congestive) heart failure: Secondary | ICD-10-CM

## 2018-10-31 DIAGNOSIS — I779 Disorder of arteries and arterioles, unspecified: Secondary | ICD-10-CM

## 2018-10-31 DIAGNOSIS — I10 Essential (primary) hypertension: Secondary | ICD-10-CM | POA: Diagnosis not present

## 2018-10-31 DIAGNOSIS — G3184 Mild cognitive impairment, so stated: Secondary | ICD-10-CM
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
## 2018-11-03 ENCOUNTER — Telehealth: Payer: Self-pay | Admitting: Pharmacist

## 2018-11-03 NOTE — Patient Outreach (Signed)
[facility]) Care Management  11/03/2018  TAIDEN RAYBOURN 1940/12/19 [id number]   Patient received his medications from patient assistance programs and communicated understanding on how to reorder.  Plan: Close patient's pharmacy case. Mail closure letters to patient and provider.  Elayne Guerin, PharmD, [place] Clinical Pharmacist [phone]

## 2018-11-03 NOTE — Telephone Encounter (Signed)
-----   Message from Jason Fila, CPhT sent at 10/25/2018 11:44 AM EDT ----- Juluis Rainier...case closure

## 2018-11-07 ENCOUNTER — Ambulatory Visit: Payer: Self-pay | Admitting: Cardiology

## 2018-11-15 DIAGNOSIS — Z029 Encounter for administrative examinations, unspecified: Secondary | ICD-10-CM

## 2018-11-21 ENCOUNTER — Other Ambulatory Visit: Payer: Self-pay | Admitting: Cardiology

## 2018-11-24 ENCOUNTER — Other Ambulatory Visit: Payer: Medicare HMO

## 2018-11-24 ENCOUNTER — Telehealth: Payer: Self-pay | Admitting: Internal Medicine

## 2018-11-24 NOTE — Telephone Encounter (Signed)
Per Amy Rodriguez:  "A call transferred to me from Kit [employer] customer relations regarding patient complain about you.  Per Kit, patient called [employer] 10/26/2018, his complain is addressing a question is not answered by the provider related to Cognitive Impairment.  She gave me the telephone # to call back regarding the complain/issue.  Kindly give them a call regarding the complain/isssue.  [employer] Tel. No. [phone] option 2 Case# [id number]"  Returned call and spoke with representative around 1:10pm on 11/24/2018 for 10 minutes.    No date of service presented. 3/25, he complained to customer service.  Member stated he does not get answers to questions when he has asked about cognitive impairment.  3/30, we did a phone visit and addressed these concerns.  I have also spoken with his daughter about this.  The insurance asked that we reach out to him about the concern.  This is the concern we already addressed 3/30.  We had reviewed his declining mmse and need for help with medication mgt.     L. , D.O. Geriatrics [facility] [address] [place], [place] [address] Cell Phone (Mon-Fri 8am-5pm):  [phone] On Call:  [phone] & follow prompts after 5pm & weekends Office Phone:  [phone] Office Fax:  [phone]    

## 2018-11-27 ENCOUNTER — Encounter: Payer: Self-pay | Admitting: Internal Medicine

## 2018-12-01 ENCOUNTER — Other Ambulatory Visit: Payer: Self-pay

## 2018-12-01 ENCOUNTER — Ambulatory Visit (INDEPENDENT_AMBULATORY_CARE_PROVIDER_SITE_OTHER): Payer: Medicare HMO | Admitting: Internal Medicine

## 2018-12-01 ENCOUNTER — Encounter: Payer: Self-pay | Admitting: Internal Medicine

## 2018-12-01 DIAGNOSIS — I779 Disorder of arteries and arterioles, unspecified: Secondary | ICD-10-CM | POA: Diagnosis not present

## 2018-12-01 DIAGNOSIS — N183 Chronic kidney disease, stage 3 unspecified: Secondary | ICD-10-CM

## 2018-12-01 DIAGNOSIS — I5032 Chronic diastolic (congestive) heart failure: Secondary | ICD-10-CM | POA: Diagnosis not present

## 2018-12-01 DIAGNOSIS — I10 Essential (primary) hypertension: Secondary | ICD-10-CM | POA: Diagnosis not present

## 2018-12-01 DIAGNOSIS — D631 Anemia in chronic kidney disease: Secondary | ICD-10-CM

## 2018-12-01 DIAGNOSIS — I952 Hypotension due to drugs: Secondary | ICD-10-CM | POA: Diagnosis not present

## 2018-12-01 MED ORDER — AMLODIPINE BESYLATE 10 MG PO TABS: ORAL_TABLET | ORAL | 3 refills | Status: DC

## 2018-12-01 NOTE — Progress Notes (Signed)
Patient ID: [name], male   DOB: [birth year], 78 y.o.   MRN: [id number] This service is provided via telemedicine  No vital signs collected/recorded due to the encounter was a telemedicine visit.   Location of patient (ex: home, work):  HOME  Patient consents to a telephone visit:  YES  Location of the provider (ex: office, home):  OFFICE  Name of any referring provider:  [facility] Kyandra Mcclaine DO  Names of all persons participating in the telemedicine service and their role in the encounter:  PATIENT, GOD-DAUGHTER Latunya Kissick, DESHANNON SMITH CMA, DR Jonelle Sidle Perseus Westall, DO  Time spent on call:  5:19    Provider:  Ammaar Encina L. Mariea Clonts, D.O., C.M.D.  Goals of Care:  Advanced Directives 04/27/2018  Does Patient Have a Medical Advance Directive? No  Would patient like information on creating a medical advance directive? No - Patient declined     Chief Complaint  Patient presents with  . Telephone Assessment    44mth follow-up    HPI: Patient is a 78 y.o. male seen today for medical management of chronic diseases.    Changed to egg whites and did finally get the zetia by mail that was ordered months ago.  He's on pravachol 80 mg daily b/c other statins are not affordable for him.    112-134/22-44 with bp cuff by god-daughter at home.  3/20, most normal diastolic has been was in the 50s.   He's on amlodipine 10mg  daily and losartan 100mg  daily now.    He's feeling good.  No dizziness at all.  No headache.  No chest pain or shortness of breath.  If he walks too far, he has to stop and rest due to claudication.  He has to have a basket or scooter at the grocery store.  Does go every now and then but not regularly.  He is wearing a mask over his nose and mouth when he goes.  He has no new concerns.  Says things are in order.    Past Medical History:  Diagnosis Date  . Anemia   . Cataract    traumatic  . Chronic diastolic heart failure ([place])   . Chronic kidney disease, stage III (moderate)  (HCC)   . Depressive disorder, not elsewhere classified   . Depressive disorder, not elsewhere classified   . Elevated prostate specific antigen (PSA)   . Elevated prostate specific antigen (PSA)   . Gingivitis   . Hyperlipidemia   . Hypertension   . Hypertensive kidney disease, benign   . 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 Microscopic hematuria   . Nondependent cannabis abuse   . 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 Peripheral vascular disease, unspecified ([place])   . Pure hypercholesterolemia   . Unspecified essential hypertension   . Unspecified late effects of cerebrovascular disease   . Unspecified late effects of cerebrovascular disease   . Unspecified vitamin D deficiency   . Unspecified vitamin D deficiency     Past Surgical History:  Procedure Laterality Date  . CATARACT EXTRACTION    . CORONARY STENT PLACEMENT     2008  . EYE SURGERY     left eye,    cornea repair  . PERIPHERAL VASCULAR CATHETERIZATION N/A 07/28/2016   Procedure: Lower Extremity Angiography;  Surgeon: Adrian Prows, MD;  Location: [facility];  Service: Cardiovascular;  Laterality: N/A;    Allergies  Allergen Reactions  . Penicillins Other (See Comments)    Unknown  Has patient had a PCN reaction causing immediate rash, facial/tongue/throat swelling, SOB or lightheadedness with hypotension: {unknown Has patient had a PCN reaction causing severe rash involving mucus membranes or skin necrosis: unknown Has patient had a PCN reaction that required hospitalization {unknown Has patient had a PCN reaction occurring within the last 10 years: no If all of the above answers are "NO", then may proceed with Cephalosporin use.    Outpatient Encounter Medications as of 12/01/2018  Medication Sig  .  amLODipine (NORVASC) 10 MG tablet Take one tablet by mouth once daily for blood pressure  . aspirin 81 MG tablet Take 81 mg by mouth daily.    Marland Kitchen CALTRATE 600+D 600-800 MG-UNIT TABS TAKE 1 TABLET TWICE DAILY  . cilostazol (PLETAL) 100 MG tablet TAKE 1 TABLET TWICE DAILY  . ezetimibe (ZETIA) 10 MG tablet Take 10 mg by mouth daily.  . furosemide (LASIX) 20 MG tablet Take one tablet by mouth every morning and take one tablet by mouth every evening  . latanoprost (XALATAN) 0.005 % ophthalmic solution Place 1 drop into both eyes at bedtime.   Marland Kitchen losartan (COZAAR) 100 MG tablet Take 1 tablet (100 mg total) by mouth daily.  . pravastatin (PRAVACHOL) 80 MG tablet Take one tablet by mouth once daily to control cholesterol  . timolol (BETIMOL) 0.5 % ophthalmic solution Place 1 drop into the left eye daily.   . [DISCONTINUED] timolol (TIMOPTIC) 0.5 % ophthalmic solution    No facility-administered encounter medications on file as of 12/01/2018.     Review of Systems:  Review of Systems  Constitutional: Negative for chills, fever and malaise/fatigue.  HENT: Positive for hearing loss. Negative for congestion.   Eyes: Negative for blurred vision.  Respiratory: Negative for cough and shortness of breath.   Cardiovascular: Positive for claudication. Negative for chest pain, palpitations, orthopnea, leg swelling and PND.  Gastrointestinal: Negative for abdominal pain, blood in stool, constipation, diarrhea and melena.  Genitourinary: Negative for dysuria.  Musculoskeletal: Negative for falls and joint pain.  Skin: Negative for itching and rash.  Neurological: Negative for dizziness, loss of consciousness and headaches.  Psychiatric/Behavioral: Positive for memory loss. Negative for depression. The patient is not nervous/anxious and does not have insomnia.     Health Maintenance  Topic Date Due  . TETANUS/TDAP  03/14/1960  . INFLUENZA VACCINE  03/04/2019  . PNA vac Low Risk Adult  Completed     Physical Exam: Could not be performed as visit non face-to-face via phone   Labs reviewed: Basic Metabolic Panel: Recent Labs    04/18/18 1036 07/21/18 1532  NA 142 139  K 5.0 5.0  CL 110 109  CO2 25 24  GLUCOSE 99 91  BUN 26* 43*  CREATININE 1.10 1.52*  CALCIUM 9.7 9.8   Liver Function Tests: Recent Labs    04/18/18 1036  AST 24  ALT 15  BILITOT 0.4  PROT 7.0   No results for input(s): LIPASE, AMYLASE in the last 8760 hours. No results for input(s): AMMONIA in the last 8760 hours. CBC: Recent Labs    04/18/18 1036  WBC 5.2  NEUTROABS 2,616  HGB 12.7*  HCT 39.3  MCV 89.7  PLT 220   Lipid Panel: Recent Labs    04/18/18 1036  CHOL 188  HDL 52  LDLCALC 117*  TRIG 88  CHOLHDL 3.6   Lab Results  Component Value Date   HGBA1C 6.2 (H) 07/22/2017    Procedures since last visit: No results found.  Assessment/Plan 1. Hypotension due to drugs -oddly, diastolic is now running incredibly low -will decrease amlodipine from 10mg  to 5mg  daily (his god daughter will cut in 1/2 for him) -keep losartan the same -come for labs (which were scheduled for before this visit)--was rescheduled today  2. Essential hypertension -bp low diastolic now with just two pills for bp so will reduce amlodipine - amLODipine (NORVASC) 10 MG tablet; Take one-half tablet by mouth once daily for blood pressure  Dispense: 90 tablet; Refill: 3  3. PAOD (peripheral arterial occlusive disease) ([place]) -continues to have claudication, cont pletal and plavix and asa, continue some walking; no longer smokes reportedly since 1972  4. Chronic kidney disease, stage III (moderate) (HCC) -Avoid nephrotoxic agents like nsaids, dose adjust renally excreted meds, hydrate. -f/u renal function   5. Chronic diastolic heart failure (HCC) -cont lasix, arb -no chest pain or sob or edema reported at this time -f/u with cardiology as usual (phone if needed)  6. Anemia in stage 3 chronic kidney  disease ([place]) -f/u lab within the week  Labs/tests ordered: cbc, cmp, hba1c, lipid within the week  Next appt: 04/03/2019  Non face-to-face time spent on televisit:  25 mins  Annisten [place] L. Verlie Liotta, D.O. [facility] [address], [place] [address] Cell Phone (Mon-Fri 8am-5pm):  [phone] On Call:  [phone] & follow prompts after 5pm & weekends Office Phone:  [phone] Office Fax:  [phone]

## 2018-12-05 ENCOUNTER — Other Ambulatory Visit: Payer: Medicare HMO

## 2018-12-05 ENCOUNTER — Other Ambulatory Visit: Payer: Self-pay

## 2018-12-05 DIAGNOSIS — I10 Essential (primary) hypertension: Secondary | ICD-10-CM | POA: Diagnosis not present

## 2018-12-05 DIAGNOSIS — N183 Chronic kidney disease, stage 3 (moderate): Secondary | ICD-10-CM | POA: Diagnosis not present

## 2018-12-05 DIAGNOSIS — I5032 Chronic diastolic (congestive) heart failure: Secondary | ICD-10-CM | POA: Diagnosis not present

## 2018-12-05 DIAGNOSIS — R739 Hyperglycemia, unspecified: Secondary | ICD-10-CM | POA: Diagnosis not present

## 2018-12-05 DIAGNOSIS — D631 Anemia in chronic kidney disease: Secondary | ICD-10-CM | POA: Diagnosis not present

## 2018-12-06 LAB — LIPID PANEL
Cholesterol: 147 mg/dL (ref <200)
HDL: 55 mg/dL (ref >=40)
LDL Cholesterol (Calc): 79 mg/dL (calc)
Non-HDL Cholesterol (Calc): 92 mg/dL (calc) (ref <130)
Total CHOL/HDL Ratio: 2.7 (calc) (ref <5.0)
Triglycerides: 54 mg/dL (ref <150)

## 2018-12-06 LAB — COMPLETE METABOLIC PANEL WITH GFR
AG Ratio: 1.3 (calc) (ref 1.0–2.5)
ALT: 9 U/L (ref 9–46)
AST: 16 U/L (ref 10–35)
Albumin: 4.4 g/dL (ref 3.6–5.1)
Alkaline phosphatase (APISO): 62 U/L (ref 35–144)
BUN/Creatinine Ratio: 26 (calc) — ABNORMAL HIGH (ref 6–22)
BUN: 39 mg/dL — ABNORMAL HIGH (ref 7–25)
CO2: 29 mmol/L (ref 20–32)
Calcium: 9.9 mg/dL (ref 8.6–10.3)
Chloride: 106 mmol/L (ref 98–110)
Creat: 1.5 mg/dL — ABNORMAL HIGH (ref 0.70–1.18)
GFR, Est African American: 51 mL/min/{1.73_m2} — ABNORMAL LOW (ref >=60)
GFR, Est Non African American: 44 mL/min/{1.73_m2} — ABNORMAL LOW (ref >=60)
Globulin: 3.3 g/dL (calc) (ref 1.9–3.7)
Glucose, Bld: 105 mg/dL — ABNORMAL HIGH (ref 65–99)
Potassium: 4.5 mmol/L (ref 3.5–5.3)
Sodium: 140 mmol/L (ref 135–146)
Total Bilirubin: 0.3 mg/dL (ref 0.2–1.2)
Total Protein: 7.7 g/dL (ref 6.1–8.1)

## 2018-12-06 LAB — CBC WITH DIFFERENTIAL/PLATELET
Absolute Monocytes: 688 cells/uL (ref 200–950)
Basophils Absolute: 28 cells/uL (ref 0–200)
Basophils Relative: 0.5 %
Eosinophils Absolute: 605 cells/uL — ABNORMAL HIGH (ref 15–500)
Eosinophils Relative: 11 %
HCT: 34.4 % — ABNORMAL LOW (ref 38.5–50.0)
Hemoglobin: 11.4 g/dL — ABNORMAL LOW (ref 13.2–17.1)
Lymphs Abs: 1056 cells/uL (ref 850–3900)
MCH: 30.1 pg (ref 27.0–33.0)
MCHC: 33.1 g/dL (ref 32.0–36.0)
MCV: 90.8 fL (ref 80.0–100.0)
MPV: 9.4 fL (ref 7.5–12.5)
Monocytes Relative: 12.5 %
Neutro Abs: 3124 cells/uL (ref 1500–7800)
Neutrophils Relative %: 56.8 %
Platelets: 271 10*3/uL (ref 140–400)
RBC: 3.79 10*6/uL — ABNORMAL LOW (ref 4.20–5.80)
RDW: 12.9 % (ref 11.0–15.0)
Total Lymphocyte: 19.2 %
WBC: 5.5 10*3/uL (ref 3.8–10.8)

## 2018-12-06 LAB — HEMOGLOBIN A1C
Hgb A1c MFr Bld: 6.2 % of total Hgb — ABNORMAL HIGH (ref <5.7)
Mean Plasma Glucose: 131 (calc)
eAG (mmol/L): 7.3 (calc)

## 2018-12-08 ENCOUNTER — Encounter: Payer: Self-pay | Admitting: *Deleted

## 2018-12-09 ENCOUNTER — Encounter: Payer: Self-pay | Admitting: Cardiology

## 2018-12-11 ENCOUNTER — Encounter: Payer: Self-pay | Admitting: Cardiology

## 2018-12-11 DIAGNOSIS — I69951 Hemiplegia and hemiparesis following unspecified cerebrovascular disease affecting right dominant side: Secondary | ICD-10-CM

## 2018-12-11 HISTORY — DX: Hemiplegia and hemiparesis following unspecified cerebrovascular disease affecting right dominant side: I69.951

## 2018-12-11 NOTE — Progress Notes (Signed)
Virtual Visit via Telephone Note: Patient unable to use video assisted device.  This visit type was conducted due to national recommendations for restrictions regarding the COVID-19 Pandemic (e.g. social distancing).  This format is felt to be most appropriate for this patient at this time.  All issues noted in this document were discussed and addressed.  No physical exam was performed.  The patient has consented to conduct a Telehealth visit and understands insurance will be billed.   I connected with@, on 12/12/18 at  by TELEPHONE and verified that I am speaking with the correct person using two identifiers.   I discussed the limitations of evaluation and management by telemedicine and the availability of in person appointments. The patient expressed understanding and agreed to proceed.   I have discussed with patient regarding the safety during COVID Pandemic and steps and precautions to be taken including social distancing, frequent hand wash and use of detergent soap, gels with the patient. I asked the patient to avoid touching mouth, nose, eyes, ears with the hands. I encouraged regular walking around the neighborhood and exercise and regular diet, as long as social distancing can be maintained.  Primary Physician/Referring:  Gayland Curry, DO  Patient ID: [name], male    DOB: 10/22/1940, 78 y.o.   MRN: [id number]  Chief Complaint  Patient presents with  . PAD  . Hypertension  . Follow-up    HPI: [name]  is a 78 y.o. 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 Upon review of his chart, it appears patent he is also developed mild cognitive impairment.  He is been evaluated by Dr. Mariea Clonts and is presently not a to drive until at least June 2020.  Patient states his  symptoms have remained stable with slight decreased physical activity due to claudication symptoms, states that both legs are equally worse. He has not had any chest pain, no shortness of breath, no PND or orthopnea. He also has L4 S1 disc compression.  He is on chronic dual antiplatelet therapy. He is tolerating all his medications well without any side effects. due to low diastolic blood pressure, amlodipine dose was reduced from 10 mg to 5 mg.  Losartan dose has been increased to100 mg. His care is now being taken over by his daughter and goddaughter, since then diabetes is improved and blood pressure has remained stable.  Past Medical History:  Diagnosis Date  . Anemia   . Cataract    traumatic  . Chronic diastolic heart failure ([place])   . Chronic kidney disease, stage III (moderate) (HCC)   . Depressive disorder, not elsewhere classified   . Depressive disorder, not elsewhere classified   . Elevated prostate specific antigen (PSA)   . Elevated prostate specific antigen (PSA)   . Gingivitis   . 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 Hyperlipidemia   . Hypertension   . Hypertensive kidney disease, benign   . 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 Microscopic hematuria   . Nondependent cannabis abuse   . Pure hypercholesterolemia   . Unspecified late effects of cerebrovascular disease   . Unspecified late effects of cerebrovascular disease   . Unspecified vitamin D deficiency   .  Unspecified vitamin D deficiency     Past Surgical History:  Procedure Laterality Date  . CATARACT EXTRACTION    . CORONARY STENT PLACEMENT     2008  . EYE SURGERY     left eye,    cornea repair  . PERIPHERAL VASCULAR CATHETERIZATION N/A 07/28/2016   Procedure: Lower Extremity Angiography;  Surgeon: Adrian Prows, MD;  Location: [facility];  Service: Cardiovascular;  Laterality:  N/A;    Social History   Socioeconomic History  . Marital status: Widowed    Spouse name: Not on file  . Number of children: 2  . Years of education: Not on file  . Highest education level: Not on file  Occupational History  . Not on file  Social Needs  . Financial resource strain: Not hard at all  . Food insecurity:    Worry: Never true    Inability: Never true  . Transportation needs:    Medical: No    Non-medical: No  Tobacco Use  . Smoking status: Former Smoker    Packs/day: 0.50    Years: 13.00    Pack years: 6.50    Last attempt to quit: 03/09/1971    Years since quitting: 47.7  . Smokeless tobacco: Never Used  Substance and Sexual Activity  . Alcohol use: No    Alcohol/week: 0.0 standard drinks  . Drug use: No  . Sexual activity: Not on file  Lifestyle  . Physical activity:    Days per week: 4 days    Minutes per session: 10 min  . Stress: Not at all  Relationships  . Social connections:    Talks on phone: More than three times a week    Gets together: More than three times a week    Attends religious service: Never    Active member of club or organization: No    Attends meetings of clubs or organizations: Never    Relationship status: Divorced  . Intimate partner violence:    Fear of current or ex partner: No    Emotionally abused: No    Physically abused: No    Forced sexual activity: No  Other Topics Concern  . Not on file  Social History Narrative  . Not on file    Review of Systems  Constitution: Negative for chills, decreased appetite, malaise/fatigue and weight gain.  Cardiovascular: Positive for claudication (bilateral calf and also night cramps). Negative for dyspnea on exertion, leg swelling and syncope.  Endocrine: Negative for cold intolerance.  Hematologic/Lymphatic: Does not bruise/bleed easily.  Musculoskeletal: Positive for muscle cramps and muscle weakness (right sided weakness from stroke). Negative for joint swelling.   Gastrointestinal: Negative for abdominal pain, anorexia and change in bowel habit.  Neurological: Positive for focal weakness (right hemiparesis). Negative for headaches and light-headedness.  Psychiatric/Behavioral: Negative for depression and substance abuse.  All other systems reviewed and are negative.     Objective  Blood pressure (!) 139/44, pulse 63, height 5\' 9"  (1.753 m), weight 155 lb (70.3 kg). Body mass index is 22.89 kg/m.  Physical exam not performed or limited due to virtual visit.   Please see exam details from prior visit is as below.   Physical Exam  Constitutional: He appears well-developed and well-nourished. No distress.  HENT:  Head: Atraumatic.  Eyes: Conjunctivae are normal.  Neck: Neck supple. No JVD present. No thyromegaly present.  Cardiovascular: Normal rate, regular rhythm, S1 normal and S2 normal. Exam reveals no gallop.  Murmur heard.  Harsh early systolic murmur is present with a grade of 2/6 at the upper right sternal border radiating to the neck. High-pitched blowing decrescendo early diastolic murmur is present with a grade of 3/6 at the upper right sternal border radiating to the apex. Pulses:      Carotid pulses are on the right side with bruit and on the left side with bruit.      Femoral pulses are 2+ on the right side and 2+ on the left side.      Popliteal pulses are 1+ on the right side and 2+ on the left side.       Dorsalis pedis pulses are 0 on the right side and 0 on the left side.       Posterior tibial pulses are 0 on the right side and 1+ on the left side.  Bilateral legs, loss of hair. No edema.  Pulmonary/Chest: Effort normal and breath sounds normal.  Abdominal: Soft. Bowel sounds are normal.  Musculoskeletal: Normal range of motion.        General: No edema.  Neurological: He is alert.  Skin: Skin is warm and dry.  Psychiatric: He has a normal mood and affect.   Radiology: No results found.  Laboratory examination:     CMP Latest Ref Rng & Units 12/05/2018 07/21/2018 04/18/2018  Glucose 65 - 99 mg/dL 105(H) 91 99  BUN 7 - 25 mg/dL 39(H) 43(H) 26(H)  Creatinine 0.70 - 1.18 mg/dL 1.50(H) 1.52(H) 1.10  Sodium 135 - 146 mmol/L 140 139 142  Potassium 3.5 - 5.3 mmol/L 4.5 5.0 5.0  Chloride 98 - 110 mmol/L 106 109 110  CO2 20 - 32 mmol/L 29 24 25   Calcium 8.6 - 10.3 mg/dL 9.9 9.8 9.7  Total Protein 6.1 - 8.1 g/dL 7.7 - 7.0  Total Bilirubin 0.2 - 1.2 mg/dL 0.3 - 0.4  Alkaline Phos 40 - 115 U/L - - -  AST 10 - 35 U/L 16 - 24  ALT 9 - 46 U/L 9 - 15   CBC Latest Ref Rng & Units 12/05/2018 04/18/2018 07/22/2017  WBC 3.8 - 10.8 Thousand/uL 5.5 5.2 4.8  Hemoglobin 13.2 - 17.1 g/dL 11.4(L) 12.7(L) 13.0(L)  Hematocrit 38.5 - 50.0 % 34.4(L) 39.3 39.1  Platelets 140 - 400 Thousand/uL 271 220 224   Lipid Panel     Component Value Date/Time   CHOL 147 12/05/2018 0834   CHOL 143 09/20/2015 0855   TRIG 54 12/05/2018 0834   HDL 55 12/05/2018 0834   HDL 48 09/20/2015 0855   CHOLHDL 2.7 12/05/2018 0834   VLDL 13 03/23/2016 0820   LDLCALC 79 12/05/2018 0834   HEMOGLOBIN A1C Lab Results  Component Value Date   HGBA1C 6.2 (H) 12/05/2018   MPG 131 12/05/2018   TSH No results for input(s): TSH in the last 8760 hours.  PRN Meds:. There are no discontinued medications. Current Meds  Medication Sig  . amLODipine (NORVASC) 10 MG tablet Take one-half tablet by mouth once daily for blood pressure (Patient taking differently: Take 5 mg by mouth daily. Reduced due to low DBP)  . aspirin 81 MG tablet Take 81 mg by mouth daily.    Marland Kitchen CALTRATE 600+D 600-800 MG-UNIT TABS TAKE 1 TABLET TWICE DAILY  . cilostazol (PLETAL) 100 MG tablet TAKE 1 TABLET TWICE DAILY  . ezetimibe (ZETIA) 10 MG tablet Take 10 mg by mouth daily.  . furosemide (LASIX) 20 MG tablet Take one tablet by mouth every morning and take one  tablet by mouth every evening  . latanoprost (XALATAN) 0.005 % ophthalmic solution Place 1 drop into both eyes at bedtime.    Marland Kitchen losartan (COZAAR) 100 MG tablet Take 1 tablet (100 mg total) by mouth daily.  . pravastatin (PRAVACHOL) 80 MG tablet Take one tablet by mouth once daily to control cholesterol  . timolol (BETIMOL) 0.5 % ophthalmic solution Place 1 drop into the left eye daily.     Cardiac Studies:   Carotid Doppler  08/12/2017: Mild stenosis in the right internal carotid artery (1-15%). Minimal stenosis in the right common carotid artery (<50%). Antegrade right vertebral artery flow. Bidirectional left vertebral artery flow. May suggest proximal left subclavian stenosis. Compared to the study done on 08/12/2016, right ICA stenosis of less than 50% no longer present. Further studies when clinically indicated.  Echocardiogram [02/17/2017]: 1. Left ventricle cavity is normal in size. Moderate concentric hypertrophy of the left ventricle. Normal global wall motion. Doppler evidence of grade II (pseudonormal) diastolic dysfunction. Calculated EF 69%. 2. Left atrial cavity is mild to moderately dilated. 3. Moderate (Grade II) aortic regurgitation. Mild calcification of the aortic valve annulus. Moderate aortic valve leaflet calcification. Moderately restricted aortic valve leaflets. Mild aortic valve stenosis with peak gradient 32 and mean gradient 17 mm of Hg. 4. Mild tricuspid regurgitation. No evidence of pulmonary hypertension. 6. No diagnostic change c.f. echo. of 06/02/2016  Coronary Angiogram [07/28/2016]: Moderate diffuse SFA disease. Right below knee severe diffuse disease with PT run-off. Left PT and Peroneal run-off, diffuse disease.  Lower Extremity Doppler [06/16/2016]:  No hemodynamically significant stenoses are identified in the right lower extremity arterial system. There is severe diffuse mixed plaque throughout. Moderate velocity increase at the left mid superficial femoral artery suggesting >50% stenosis. There is severe diffuse mixed plaque througout. This exam reveals severely decreased  perfusion of the right lower extremity with RABI 0.46 and moderately decreased perfusion of the left lower extremity with LABI 0.69, noted at the post tibial artery level.  Peripheral arteriogram 07/28/2016: Moderate diffuse SFA disease. Right below knee severe diffuse disease with PT run-off. 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 Right bundle branch block. LVH with repolarization abnormality, cannot exclude inferior and lateral ischemia. PAC.  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 (tel encounter)  His blood pressure is much better controlled with increasing losartan.  However renal function is slightly worsened and We will consider increasing amlodipine back to 10 mg and reducing the dose of losartan Back to 50 mg.  His serum creatinine has remained stable over the past 6 months ago.   Low diastolic blood pressure is probably related to worsening aortic regurgitation and hence he will need repeat echocardiogram. I will set this up in a month and see him back in 6 weeks. In view of his multiple cardiac risk factors, stroke in the past, aggressive control of  hypertension and diabetes is very much needed.  Lately due to hypotension and low DBP, amlodipine  dose was reduced.  Diabetes is now controlled.  His daughter and god daughter are now involved in his care.  Due to insurance, Crestor not started, he is back on Pravastatin and lipids are well controlled With the addition of Zetia.  I reviewed his recently performed labs.     With regard to carotid bruit, carotid duplex does not reveal significant stenosis.   With regard to PAD symptoms of claudication are also stable.   I spoke to his Goddaughter [name].   Adrian Prows, MD, [facility] 12/12/2018, 10:55 AM [facility]. [place] Pager: [phone] Office: [phone] If no answer Cell [phone]

## 2018-12-12 ENCOUNTER — Encounter: Payer: Self-pay | Admitting: Cardiology

## 2018-12-12 ENCOUNTER — Ambulatory Visit (INDEPENDENT_AMBULATORY_CARE_PROVIDER_SITE_OTHER): Payer: Medicare HMO | Admitting: Cardiology

## 2018-12-12 ENCOUNTER — Other Ambulatory Visit: Payer: Self-pay

## 2018-12-12 VITALS — BP 139/44 | HR 63 | Ht 69.0 in | Wt 155.0 lb

## 2018-12-12 DIAGNOSIS — I69351 Hemiplegia and hemiparesis following cerebral infarction affecting right dominant side: Secondary | ICD-10-CM

## 2018-12-12 DIAGNOSIS — I779 Disorder of arteries and arterioles, unspecified: Secondary | ICD-10-CM

## 2018-12-12 DIAGNOSIS — I63239 Cerebral infarction due to unspecified occlusion or stenosis of unspecified carotid arteries: Secondary | ICD-10-CM | POA: Diagnosis not present

## 2018-12-12 DIAGNOSIS — I351 Nonrheumatic aortic (valve) insufficiency: Secondary | ICD-10-CM

## 2018-12-12 DIAGNOSIS — I10 Essential (primary) hypertension: Secondary | ICD-10-CM

## 2018-12-26 ENCOUNTER — Other Ambulatory Visit: Payer: Self-pay | Admitting: Internal Medicine

## 2018-12-26 DIAGNOSIS — I10 Essential (primary) hypertension: Secondary | ICD-10-CM

## 2019-01-02 ENCOUNTER — Ambulatory Visit: Payer: Self-pay | Admitting: Internal Medicine

## 2019-01-05 ENCOUNTER — Other Ambulatory Visit: Payer: Self-pay | Admitting: Internal Medicine

## 2019-01-09 ENCOUNTER — Telehealth: Payer: Self-pay | Admitting: Internal Medicine

## 2019-01-09 MED ORDER — AMLODIPINE BESYLATE 5 MG PO TABS: 5.0000 mg | ORAL_TABLET | Freq: Every day | ORAL | 1 refills | Status: DC

## 2019-01-09 NOTE — Telephone Encounter (Signed)
Patient daughter requested refill.  

## 2019-01-09 NOTE — Telephone Encounter (Signed)
Patient called and stated that he received a letter from [facility] stating that Dr. Mariea Clonts had denied his request for Amlodipine 10mg .  He didn't know why this was denied.  Also wanted to know if he should continue the 5mg  Amlodipine that Dr. Mariea Clonts had prescribed or discontinue this medication.

## 2019-01-17 ENCOUNTER — Ambulatory Visit (INDEPENDENT_AMBULATORY_CARE_PROVIDER_SITE_OTHER): Payer: Medicare HMO | Admitting: Cardiology

## 2019-01-17 ENCOUNTER — Other Ambulatory Visit: Payer: Self-pay

## 2019-01-17 ENCOUNTER — Ambulatory Visit (INDEPENDENT_AMBULATORY_CARE_PROVIDER_SITE_OTHER): Payer: Medicare HMO

## 2019-01-17 VITALS — BP 168/58 | HR 56

## 2019-01-17 DIAGNOSIS — I10 Essential (primary) hypertension: Secondary | ICD-10-CM | POA: Diagnosis not present

## 2019-01-17 DIAGNOSIS — I351 Nonrheumatic aortic (valve) insufficiency: Secondary | ICD-10-CM | POA: Diagnosis not present

## 2019-01-17 NOTE — Progress Notes (Signed)
Vitals:   01/17/19 1155 01/17/19 1156  BP: (!) 168/46 (!) 168/58  Pulse: 61 (!) 56  SpO2:  95%   1. Essential hypertension      Patient's blood pressure recordings from his own apparatus and also ours correlate very well.  We will continue to monitor and will address this blood pressure on his visit to our office in 10 days.

## 2019-01-26 ENCOUNTER — Other Ambulatory Visit: Payer: Self-pay

## 2019-01-26 ENCOUNTER — Encounter: Payer: Self-pay | Admitting: Cardiology

## 2019-01-26 ENCOUNTER — Ambulatory Visit (INDEPENDENT_AMBULATORY_CARE_PROVIDER_SITE_OTHER): Payer: Medicare HMO | Admitting: Cardiology

## 2019-01-26 VITALS — BP 131/32 | HR 57 | Ht 69.0 in | Wt 155.0 lb

## 2019-01-26 DIAGNOSIS — I129 Hypertensive chronic kidney disease with stage 1 through stage 4 chronic kidney disease, or unspecified chronic kidney disease: Secondary | ICD-10-CM | POA: Diagnosis not present

## 2019-01-26 DIAGNOSIS — I63239 Cerebral infarction due to unspecified occlusion or stenosis of unspecified carotid arteries: Secondary | ICD-10-CM | POA: Diagnosis not present

## 2019-01-26 DIAGNOSIS — N183 Chronic kidney disease, stage 3 unspecified: Secondary | ICD-10-CM

## 2019-01-26 DIAGNOSIS — I10 Essential (primary) hypertension: Secondary | ICD-10-CM

## 2019-01-26 DIAGNOSIS — E7800 Pure hypercholesterolemia, unspecified: Secondary | ICD-10-CM | POA: Diagnosis not present

## 2019-01-26 DIAGNOSIS — I351 Nonrheumatic aortic (valve) insufficiency: Secondary | ICD-10-CM | POA: Diagnosis not present

## 2019-01-26 NOTE — Progress Notes (Signed)
Virtual Visit via Telephone Note: Patient unable to use video assisted device.  This visit type was conducted due to national recommendations for restrictions regarding the COVID-19 Pandemic (e.g. social distancing).  This format is felt to be most appropriate for this patient at this time.  All issues noted in this document were discussed and addressed.  No physical exam was performed.  The patient has consented to conduct a Telehealth visit and understands insurance will be billed.   I connected with@, on 01/26/19 at  by TELEPHONE and verified that I am speaking with the correct person using two identifiers.   I discussed the limitations of evaluation and management by telemedicine and the availability of in person appointments. The patient expressed understanding and agreed to proceed.   I have discussed with patient regarding the safety during COVID Pandemic and steps and precautions to be taken including social distancing, frequent hand wash and use of detergent soap, gels with the patient. I asked the patient to avoid touching mouth, nose, eyes, ears with the hands. I encouraged regular walking around the neighborhood and exercise and regular diet, as long as social distancing can be maintained.  Primary Physician/Referring:  Gayland Curry, DO  Patient ID: [name], male    DOB: 12/29/1940, 78 y.o.   MRN: [id number]  Chief Complaint  Patient presents with   Hypertension   Results   Follow-up    HPI: [name]  is a 78 y.o. 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   Virtual visit, due to dyspnea and also known aortic stenosis, I repeated his echocardiogram and he now presents for follow-up.  This is again a telephone encounter.  His daughter is present.  Denies  any worsening dyspnea, no chest pain or palpitations.  His symptoms of leg pain are also worse in view of known lumbar disc disease and he does have mild PAD.  He has not had any chest pain, no shortness of breath, no PND or orthopnea.   He is on chronic dual antiplatelet therapy. He is tolerating all his medications well without any side effects.   His care is now being taken over by his daughter and goddaughter, since then diabetes is improved and blood pressure has remained stable.  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 ([place]) 12/11/2018   Hyperlipidemia    Hypertension    Hypertensive kidney disease, benign    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)    Microscopic hematuria    Nondependent cannabis abuse    Pure hypercholesterolemia    Unspecified late effects of cerebrovascular disease    Unspecified late effects of cerebrovascular disease    Unspecified vitamin D deficiency    Unspecified vitamin D deficiency     Past Surgical History:  Procedure Laterality Date   CATARACT  EXTRACTION     CORONARY STENT PLACEMENT     2008   EYE SURGERY     left eye,    cornea repair   PERIPHERAL VASCULAR CATHETERIZATION N/A 07/28/2016   Procedure: Lower Extremity Angiography;  Surgeon: Adrian Prows, MD;  Location: [facility];  Service: Cardiovascular;  Laterality: N/A;    Social History   Socioeconomic History   Marital status: Widowed    Spouse name: Not on file   Number of children: 2   Years  of education: Not on file   Highest education level: Not on file  Occupational History   Not on file  Social Needs   Financial resource strain: Not hard at all   Food insecurity    Worry: Never true    Inability: Never true   Transportation needs    Medical: No    Non-medical: No  Tobacco Use   Smoking status: Former Smoker    Packs/day: 0.50    Years: 13.00    Pack years: 6.50    Quit date: 03/09/1971    Years since quitting: 47.9   Smokeless tobacco: Never Used  Substance and Sexual Activity   Alcohol use: No    Alcohol/week: 0.0 standard drinks   Drug use: No   Sexual activity: Not on file  Lifestyle   Physical activity    Days per week: 4 days    Minutes per session: 10 min   Stress: Not at all  Relationships   Social connections    Talks on phone: More than three times a week    Gets together: More than three times a week    Attends religious service: Never    Active member of club or organization: No    Attends meetings of clubs or organizations: Never    Relationship status: Divorced   Intimate partner violence    Fear of current or ex partner: No    Emotionally abused: No    Physically abused: No    Forced sexual activity: No  Other Topics Concern   Not on file  Social History Narrative   Not on file   Review of Systems  Constitution: Negative for chills, decreased appetite, malaise/fatigue and weight gain.  Cardiovascular: Positive for claudication (bilateral calf and also night cramps). Negative for dyspnea on exertion, leg swelling and syncope.  Endocrine: Negative for cold intolerance.  Hematologic/Lymphatic: Does not bruise/bleed easily.  Musculoskeletal: Positive for muscle cramps and muscle weakness (right sided weakness from stroke). Negative for joint swelling.  Gastrointestinal: Negative for abdominal pain, anorexia and change in bowel habit.  Neurological: Positive for focal weakness (right hemiparesis). Negative for headaches  and light-headedness.  Psychiatric/Behavioral: Negative for depression and substance abuse.  All other systems reviewed and are negative.     Objective  Blood pressure (!) 131/32, pulse (!) 57, height 5\' 9"  (1.753 m), weight 155 lb (70.3 kg). Body mass index is 22.89 kg/m.  Physical exam not performed or limited due to virtual visit.   Please see exam details from prior visit is as below.   Physical Exam  Constitutional: He appears well-developed and well-nourished. No distress.  HENT:  Head: Atraumatic.  Eyes: Conjunctivae are normal.  Neck: Neck supple. No JVD present. No thyromegaly present.  Cardiovascular: Normal rate, regular rhythm, S1 normal and S2 normal. Exam reveals no gallop.  Murmur heard.  Harsh early systolic murmur is present with a grade of 2/6 at the upper right sternal border radiating to the  neck. High-pitched blowing decrescendo early diastolic murmur is present with a grade of 3/6 at the upper right sternal border radiating to the apex. Pulses:      Carotid pulses are on the right side with bruit and on the left side with bruit.      Femoral pulses are 2+ on the right side and 2+ on the left side.      Popliteal pulses are 1+ on the right side and 2+ on the left side.       Dorsalis pedis pulses are 0 on the right side and 0 on the left side.       Posterior tibial pulses are 0 on the right side and 1+ on the left side.  Bilateral legs, loss of hair. No edema.  Pulmonary/Chest: Effort normal and breath sounds normal.  Abdominal: Soft. Bowel sounds are normal.  Musculoskeletal: Normal range of motion.        General: No edema.  Neurological: He is alert.  Skin: Skin is warm and dry.  Psychiatric: He has a normal mood and affect.   Radiology: No results found.  Laboratory examination:   CMP Latest Ref Rng & Units 12/05/2018 07/21/2018 04/18/2018  Glucose 65 - 99 mg/dL 105(H) 91 99  BUN 7 - 25 mg/dL 39(H) 43(H) 26(H)  Creatinine 0.70 - 1.18 mg/dL 1.50(H)  1.52(H) 1.10  Sodium 135 - 146 mmol/L 140 139 142  Potassium 3.5 - 5.3 mmol/L 4.5 5.0 5.0  Chloride 98 - 110 mmol/L 106 109 110  CO2 20 - 32 mmol/L 29 24 25   Calcium 8.6 - 10.3 mg/dL 9.9 9.8 9.7  Total Protein 6.1 - 8.1 g/dL 7.7 - 7.0  Total Bilirubin 0.2 - 1.2 mg/dL 0.3 - 0.4  Alkaline Phos 40 - 115 U/L - - -  AST 10 - 35 U/L 16 - 24  ALT 9 - 46 U/L 9 - 15   CBC Latest Ref Rng & Units 12/05/2018 04/18/2018 07/22/2017  WBC 3.8 - 10.8 Thousand/uL 5.5 5.2 4.8  Hemoglobin 13.2 - 17.1 g/dL 11.4(L) 12.7(L) 13.0(L)  Hematocrit 38.5 - 50.0 % 34.4(L) 39.3 39.1  Platelets 140 - 400 Thousand/uL 271 220 224   Lipid Panel     Component Value Date/Time   CHOL 147 12/05/2018 0834   CHOL 143 09/20/2015 0855   TRIG 54 12/05/2018 0834   HDL 55 12/05/2018 0834   HDL 48 09/20/2015 0855   CHOLHDL 2.7 12/05/2018 0834   VLDL 13 03/23/2016 0820   LDLCALC 79 12/05/2018 0834   HEMOGLOBIN A1C Lab Results  Component Value Date   HGBA1C 6.2 (H) 12/05/2018   MPG 131 12/05/2018   TSH No results for input(s): TSH in the last 8760 hours.  PRN Meds:. There are no discontinued medications. Current Meds  Medication Sig   amLODipine (NORVASC) 5 MG tablet Take 1 tablet (5 mg total) by mouth daily.   aspirin 81 MG tablet Take 81 mg by mouth daily.     CALTRATE 600+D 600-800 MG-UNIT TABS TAKE 1 TABLET TWICE DAILY   cilostazol (PLETAL) 100 MG tablet TAKE 1 TABLET TWICE DAILY   ezetimibe (ZETIA) 10 MG tablet Take 10 mg by mouth daily.   furosemide (LASIX) 20 MG tablet TAKE ONE TABLET BY MOUTH EVERY MORNING AND TAKE ONE TABLET BY MOUTH EVERY EVENING   latanoprost (XALATAN) 0.005 % ophthalmic solution Place 1 drop into both eyes at bedtime.    losartan (COZAAR) 100 MG tablet Take 1 tablet (100 mg total) by  mouth daily.   pravastatin (PRAVACHOL) 80 MG tablet TAKE 1 TABLET EVERY DAY TO CONTROL CHOLESTEROL   timolol (BETIMOL) 0.5 % ophthalmic solution Place 1 drop into the left eye daily.     Cardiac  Studies:   Coronary Angiogram [07/28/2016]: Moderate diffuse SFA disease. Right below knee severe diffuse disease with PT run-off. Left PT and Peroneal run-off, diffuse disease.  Lower Extremity Doppler [06/16/2016]:  No hemodynamically significant stenoses are identified in the right lower extremity arterial system. There is severe diffuse mixed plaque throughout. Moderate velocity increase at the left mid superficial femoral artery suggesting >50% stenosis. There is severe diffuse mixed plaque througout. This exam reveals severely decreased perfusion of the right lower extremity with RABI 0.46 and moderately decreased perfusion of the left lower extremity with LABI 0.69, noted at the post tibial artery level.  Peripheral arteriogram 07/28/2016: Moderate diffuse SFA disease. Right below knee severe diffuse disease with PT run-off. Left PT and Peroneal run-off, diffuse disease  Carotid Doppler  08/12/2017: Mild stenosis in the right internal carotid artery (1-15%). Minimal stenosis in the right common carotid artery (<50%). Antegrade right vertebral artery flow. Bidirectional left vertebral artery flow. May suggest proximal left subclavian stenosis. Compared to the study done on 08/12/2016, right ICA stenosis of less than 50% no longer present. Further studies when clinically indicated.  Echocardiogram 01/17/2019: Low normal LV systolic function with EF 50%. Left ventricle cavity is normal in size. Severe concentric hypertrophy of the left ventricle. Doppler evidence of grade I (impaired) diastolic dysfunction, normal LAP. No regional wall motion abnormalities noted.   Trileaflet aortic valve with moderately calcified aortic valve annulus. Severe right cusp calcification with restricted motion. Moderate aortic valve stenosis. Aortic valve mean gradient of 26 mmHg, Vmax of 3.6 m/s. Calculated aortic valve area by continuity equation is 1.2 cm. Moderate (Grade III) aortic regurgitation.  Mild  tricuspid regurgitation. Estimated pulmonary artery systolic pressure 24 mmHg. Compared to previous study on 02/17/2017, there is interval progression of aortic stenosis.  Assessment   1. Essential hypertension   2. Carotid stenosis, symptomatic, with infarction ([place])   3. Moderate aortic regurgitation   4. Chronic kidney disease, stage III (moderate) (HCC)   5. Hypercholesteremia    EKG 11/08/2017: Normal sinus rhythm at rate of 59 bpm, left atrial enlargement, left axis deviation, left anterior fascicular block. Right bundle branch block. LVH with repolarization abnormality, cannot exclude inferior and lateral ischemia. PAC.  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 (tel encounter)  This is a virtual visit, I reviewed the results of the echocardiogram, he has moderate aortic stenosis and is slight progression compared to 2018.  I discussed with his daughter as well, patient remains asymptomatic without dyspnea or chest pain, will continue to monitor this probably on an annual basis.  I would like to see him back in 6 months.  He has noticed his diastolic blood pressure to be very low, I do not have an explanation for this except suspect that he has arterial sclerosis and noncompressible vessels.  His lipids were again reviewed with the daughter and also I discussed guarding stage III chronic kidney disease probably related to hypertension.  Everything seems to be stable, I will see him back in 6  months no changes in the medications were done today.  Adrian Prows, MD, [facility] 01/26/2019, 1:46 PM [facility]. [place] Pager: [phone] Office: [phone] If no answer Cell [phone]

## 2019-03-01 ENCOUNTER — Telehealth: Payer: Self-pay | Admitting: Internal Medicine

## 2019-03-01 NOTE — Telephone Encounter (Signed)
Pt wants to talk to Dr Mariea Clonts or her nurse about what he needs to do about getting his drivers license.  Thanks, Vilinda Blanks

## 2019-03-02 NOTE — Telephone Encounter (Signed)
Left message on VM (that identified pt) that he would need to contact the DMV about his license, we have no control over that.

## 2019-03-07 ENCOUNTER — Other Ambulatory Visit: Payer: Self-pay | Admitting: Internal Medicine

## 2019-03-10 ENCOUNTER — Telehealth: Payer: Self-pay | Admitting: *Deleted

## 2019-03-10 DIAGNOSIS — Z9189 Other specified personal risk factors, not elsewhere classified: Secondary | ICD-10-CM

## 2019-03-10 NOTE — Telephone Encounter (Signed)
Pt is calling asking for a referral for OT to regain his driving privileges, per pt they discussed with you on 10/31/2018 telephone visit. Please advise

## 2019-03-10 NOTE — Telephone Encounter (Signed)
Left message on machine with detailed results, advised tocall if any questions.   

## 2019-03-10 NOTE — Telephone Encounter (Signed)
Referral entered.  I'm not sure which therapy locations locally offer this so it may take Lattie Haw a bit to find one.

## 2019-04-03 ENCOUNTER — Encounter: Payer: Self-pay | Admitting: Internal Medicine

## 2019-04-03 ENCOUNTER — Other Ambulatory Visit: Payer: Self-pay

## 2019-04-03 ENCOUNTER — Ambulatory Visit (INDEPENDENT_AMBULATORY_CARE_PROVIDER_SITE_OTHER): Payer: Medicare HMO | Admitting: Internal Medicine

## 2019-04-03 VITALS — BP 158/60 | HR 66 | Temp 97.9°F | Ht 69.0 in | Wt 160.0 lb

## 2019-04-03 DIAGNOSIS — R739 Hyperglycemia, unspecified: Secondary | ICD-10-CM | POA: Diagnosis not present

## 2019-04-03 DIAGNOSIS — Z23 Encounter for immunization: Secondary | ICD-10-CM | POA: Diagnosis not present

## 2019-04-03 DIAGNOSIS — N183 Chronic kidney disease, stage 3 unspecified: Secondary | ICD-10-CM

## 2019-04-03 DIAGNOSIS — I5032 Chronic diastolic (congestive) heart failure: Secondary | ICD-10-CM | POA: Diagnosis not present

## 2019-04-03 DIAGNOSIS — E7800 Pure hypercholesterolemia, unspecified: Secondary | ICD-10-CM

## 2019-04-03 DIAGNOSIS — I779 Disorder of arteries and arterioles, unspecified: Secondary | ICD-10-CM | POA: Diagnosis not present

## 2019-04-03 NOTE — Addendum Note (Signed)
Addended by: Despina Hidden on: 04/03/2019 10:51 AM   Modules accepted: Orders

## 2019-04-03 NOTE — Progress Notes (Signed)
Location:  [facility] clinic Provider:  Terrace Fontanilla L. Mariea Clonts, D.O., C.M.D.  Goals of Care:  Advanced Directives 04/27/2018  Does Patient Have a Medical Advance Directive? No  Would patient like information on creating a medical advance directive? No - Patient declined     Chief Complaint  Patient presents with  . Medical Management of Chronic Issues    40mth follow-up    HPI: Patient is a 78 y.o. [name] seen today for medical management of chronic diseases.    BP at home has been around 130.  High three times here--last lowest was 150/60.    Feeling good.    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  He does not recall getting the message on his home phone about this.  Suspect it was left with his god daughter, Jonelle Sidle.  No chest pain, sob.  Legs feeling ok unless he walks a long way.  Walking about 1/2 block causes him to need to rest.  He gets a scooter at the grocery store.  Uses a basket in dollar tree.    Appetite is good and he points to his belly.  He's gained 5 lbs since last visit.    Sleeps well.    He went on disability in 1995 for his back--used to work for the railroad for L4/L5 DDD.  He was told he could not longer work safely after he had an MRI done.    Past Medical History:  Diagnosis Date  . Anemia   . Cataract    traumatic  . Chronic diastolic heart failure ([place])   . Chronic kidney disease, stage III (moderate) (HCC)   . Depressive disorder, not elsewhere classified   . Depressive disorder, not elsewhere classified   . Elevated prostate specific antigen (PSA)   . Elevated prostate specific antigen (PSA)   . Gingivitis   . 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 Hyperlipidemia   . Hypertension   . Hypertensive kidney disease, benign   . 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 Microscopic hematuria   . Nondependent cannabis abuse    . Pure hypercholesterolemia   . Unspecified late effects of cerebrovascular disease   . Unspecified late effects of cerebrovascular disease   . Unspecified vitamin D deficiency   . Unspecified vitamin D deficiency     Past Surgical History:  Procedure Laterality Date  . CATARACT EXTRACTION    . CORONARY STENT PLACEMENT     2008  . EYE SURGERY     left eye,    cornea repair  . PERIPHERAL VASCULAR CATHETERIZATION N/A 07/28/2016   Procedure: Lower Extremity Angiography;  Surgeon: Adrian Prows, MD;  Location: [facility];  Service: Cardiovascular;  Laterality: N/A;    Allergies  Allergen Reactions  . Penicillins Other (See Comments)    Unknown  Has patient had a PCN reaction causing immediate rash, facial/tongue/throat swelling, SOB or lightheadedness with hypotension: {unknown Has patient had a PCN reaction causing severe rash involving mucus membranes or skin necrosis: unknown Has patient had a PCN reaction that required hospitalization {unknown Has patient had a PCN reaction occurring within the last 10 years: no If all of the above answers are "NO", then may proceed with Cephalosporin use.    Outpatient Encounter Medications as of 04/03/2019  Medication Sig  . amLODipine (NORVASC) 5 MG tablet Take 1 tablet (5 mg  total) by mouth daily.  Marland Kitchen aspirin 81 MG tablet Take 81 mg by mouth daily.    Marland Kitchen CALTRATE 600+D 600-800 MG-UNIT TABS TAKE 1 TABLET TWICE DAILY  . cilostazol (PLETAL) 100 MG tablet TAKE 1 TABLET TWICE DAILY  . ezetimibe (ZETIA) 10 MG tablet Take 10 mg by mouth daily.  . furosemide (LASIX) 20 MG tablet TAKE ONE TABLET BY MOUTH EVERY MORNING AND TAKE ONE TABLET BY MOUTH EVERY EVENING  . latanoprost (XALATAN) 0.005 % ophthalmic solution Place 1 drop into both eyes at bedtime.   Marland Kitchen losartan (COZAAR) 100 MG tablet TAKE 1 TABLET EVERY DAY  . pravastatin (PRAVACHOL) 80 MG tablet TAKE 1 TABLET EVERY DAY TO CONTROL CHOLESTEROL  . timolol (BETIMOL) 0.5 % ophthalmic solution  Place 1 drop into the left eye daily.    No facility-administered encounter medications on file as of 04/03/2019.     Review of Systems:  Review of Systems  Constitutional: Negative for chills, fever and malaise/fatigue.  HENT: Positive for hearing loss. Negative for congestion.   Eyes: Negative for blurred vision.  Respiratory: Negative for cough and shortness of breath.   Cardiovascular: Positive for claudication. Negative for chest pain, palpitations and leg swelling.  Gastrointestinal: Negative for abdominal pain, constipation and diarrhea.  Genitourinary: Negative for dysuria.  Musculoskeletal: Positive for back pain. Negative for falls and joint pain.  Skin: Negative for itching and rash.  Neurological: Negative for dizziness and loss of consciousness.  Psychiatric/Behavioral: Positive for memory loss. Negative for depression. The patient is not nervous/anxious and does not have insomnia.     Health Maintenance  Topic Date Due  . TETANUS/TDAP  03/14/1960  . INFLUENZA VACCINE  03/04/2019  . PNA vac Low Risk Adult  Completed    Physical Exam: Vitals:   04/03/19 0926  BP: (!) 158/60  Pulse: 66  Temp: 97.9 F (36.6 C)  TempSrc: Oral  SpO2: 97%  Weight: 160 lb (72.6 kg)  Height: 5\' 9"  (1.753 m)   Body mass index is 23.63 kg/m. Physical Exam Vitals signs reviewed.  Constitutional:      General: He is not in acute distress.    Appearance: He is normal weight. He is not toxic-appearing.  HENT:     Head: Normocephalic and atraumatic.  Eyes:     Extraocular Movements: Extraocular movements intact.     Pupils: Pupils are equal, round, and reactive to light.  Cardiovascular:     Rate and Rhythm: Normal rate and regular rhythm.     Heart sounds: Murmur present.  Pulmonary:     Effort: Pulmonary effort is normal.     Breath sounds: Normal breath sounds. No wheezing, rhonchi or rales.  Abdominal:     General: Bowel sounds are normal.  Skin:    General: Skin is dry.   Neurological:     General: No focal deficit present.     Mental Status: He is alert and oriented to person, place, and time.     Cranial Nerves: No cranial nerve deficit.     Motor: No weakness.     Gait: Gait normal.  Psychiatric:        Mood and Affect: Mood normal.        Behavior: Behavior normal.     Labs reviewed: Basic Metabolic Panel: Recent Labs    04/18/18 1036 07/21/18 1532 12/05/18 0834  NA 142 139 140  K 5.0 5.0 4.5  CL 110 109 106  CO2 25 24 29   GLUCOSE 99 91  105*  BUN 26* 43* 39*  CREATININE 1.10 1.52* 1.50*  CALCIUM 9.7 9.8 9.9   Liver Function Tests: Recent Labs    04/18/18 1036 12/05/18 0834  AST 24 16  ALT 15 9  BILITOT 0.4 0.3  PROT 7.0 7.7   No results for input(s): LIPASE, AMYLASE in the last 8760 hours. No results for input(s): AMMONIA in the last 8760 hours. CBC: Recent Labs    04/18/18 1036 12/05/18 0834  WBC 5.2 5.5  NEUTROABS 2,616 3,124  HGB 12.7* 11.4*  HCT 39.3 34.4*  MCV 89.7 90.8  PLT 220 271   Lipid Panel: Recent Labs    04/18/18 1036 12/05/18 0834  CHOL 188 147  HDL 52 55  LDLCALC 117* David  TRIG 88 54  CHOLHDL 3.6 2.7   Lab Results  Component Value Date   HGBA1C 6.2 (H) 12/05/2018    Procedures since last visit: No results found.  Assessment/Plan 1. PAOD (peripheral arterial occlusive disease) (HCC) -ongoing claudication related to this -no changes -followed by Dr. Einar Gip  2. Chronic diastolic heart failure (HCC) -no signs of acute exacerbation -cont current mgt -bp elevated today, but he's still upset with me that his license was revoked after he had a syncopal episode   3. Chronic kidney disease, stage III (moderate) (HCC) -Avoid nephrotoxic agents like nsaids, dose adjust renally excreted meds, hydrate.  4. Hyperglycemia -healthier diet encouraged,  Lab Results  Component Value Date   HGBA1C 6.2 (H) 12/05/2018   5. Pure hypercholesterolemia -last LDL just above goal but improving -cont  zetia and pravachol due to affordability vs lipitor/crestor  6. Need for influenza vaccination -high dose flu shot given.  Labs/tests ordered:  None today, do same day next visit Next appt:  07/20/2019  Marinna Blane L. Lakynn Halvorsen, D.O. [facility] [address], [place] [address] Cell Phone (Mon-Fri 8am-5pm):  [phone] On Call:  [phone] & follow prompts after 5pm & weekends Office Phone:  [phone] Office Fax:  [phone]

## 2019-04-11 ENCOUNTER — Telehealth: Payer: Self-pay | Admitting: *Deleted

## 2019-04-11 NOTE — Telephone Encounter (Signed)
Patient notified and agreed.  

## 2019-04-11 NOTE — Telephone Encounter (Signed)
Patient called and wanted clarification on his Caltrate. Stated that he has been taking One tablet twice daily but his medication bottle stated on directions to take Two tablets twice daily. Patient wants to know what he should be taking. Please Advise.

## 2019-04-11 NOTE — Telephone Encounter (Signed)
Just caltrate one twice a day is adequate.  Please make sure med list is consistent.  Thanks.

## 2019-04-13 DIAGNOSIS — Z961 Presence of intraocular lens: Secondary | ICD-10-CM | POA: Diagnosis not present

## 2019-04-13 DIAGNOSIS — H2511 Age-related nuclear cataract, right eye: Secondary | ICD-10-CM | POA: Diagnosis not present

## 2019-04-13 DIAGNOSIS — H401123 Primary open-angle glaucoma, left eye, severe stage: Secondary | ICD-10-CM | POA: Diagnosis not present

## 2019-04-13 DIAGNOSIS — H401112 Primary open-angle glaucoma, right eye, moderate stage: Secondary | ICD-10-CM | POA: Diagnosis not present

## 2019-04-19 DIAGNOSIS — R4189 Other symptoms and signs involving cognitive functions and awareness: Secondary | ICD-10-CM | POA: Diagnosis not present

## 2019-04-19 DIAGNOSIS — Z8673 Personal history of transient ischemic attack (TIA), and cerebral infarction without residual deficits: Secondary | ICD-10-CM | POA: Diagnosis not present

## 2019-04-19 DIAGNOSIS — Z9189 Other specified personal risk factors, not elsewhere classified: Secondary | ICD-10-CM | POA: Diagnosis not present

## 2019-04-19 DIAGNOSIS — M5136 Other intervertebral disc degeneration, lumbar region: Secondary | ICD-10-CM | POA: Diagnosis not present

## 2019-04-24 ENCOUNTER — Ambulatory Visit: Payer: Self-pay

## 2019-04-24 ENCOUNTER — Encounter: Payer: Self-pay | Admitting: Family

## 2019-04-25 ENCOUNTER — Ambulatory Visit (INDEPENDENT_AMBULATORY_CARE_PROVIDER_SITE_OTHER): Payer: Medicare HMO | Admitting: Family

## 2019-04-25 ENCOUNTER — Encounter: Payer: Self-pay | Admitting: Family

## 2019-04-25 ENCOUNTER — Other Ambulatory Visit: Payer: Self-pay

## 2019-04-25 DIAGNOSIS — Z23 Encounter for immunization: Secondary | ICD-10-CM

## 2019-04-25 DIAGNOSIS — Z0000 Encounter for general adult medical examination without abnormal findings: Secondary | ICD-10-CM | POA: Diagnosis not present

## 2019-04-25 MED ORDER — TETANUS-DIPHTH-ACELL PERTUSSIS 5-2-15.5 LF-MCG/0.5 IM SUSP: 0.5000 mL | Freq: Once | INTRAMUSCULAR | 0 refills | Status: AC

## 2019-04-25 MED ORDER — ZOSTER VAC RECOMB ADJUVANTED 50 MCG/0.5ML IM SUSR: 0.5000 mL | Freq: Once | INTRAMUSCULAR | 1 refills | Status: AC

## 2019-04-25 NOTE — Progress Notes (Addendum)
Subjective:   [name] is a 78 y.o. male who presents for Medicare Annual/Subsequent preventive examination.Patient at home.No acute issues voiced.   Review of Systems:   Cardiac Risk Factors include: advanced age (>77men, >64 women);male gender;smoking/ tobacco exposure;hypertension;dyslipidemia     Objective:    Vitals: There were no vitals taken for this visit.  There is no height or weight on file to calculate BMI.  Advanced Directives 04/25/2019 04/27/2018 04/18/2018 11/18/2017 07/22/2017 03/10/2017 11/02/2016  Does Patient Have a Medical Advance Directive? No No No No No No No  Would patient like information on creating a medical advance directive? Yes (ED - Information included in AVS) No - Patient declined Yes (MAU/Ambulatory/Procedural Areas - Information given) No - Patient declined No - Patient declined Yes (MAU/Ambulatory/Procedural Areas - Information given) No - Patient declined    Tobacco Social History   Tobacco Use  Smoking Status Former Smoker  . Packs/day: 0.50  . Years: 13.00  . Pack years: 6.50  . Quit date: 03/09/1971  . Years since quitting: 48.1  Smokeless Tobacco Never Used     Counseling given: Not Answered   Clinical Intake:  Pre-visit preparation completed: No  Pain : No/denies pain     BMI - recorded: 23.63 Nutritional Status: BMI of 19-24  Normal Nutritional Risks: None Diabetes: No  How often do you need to have someone help you when you read instructions, pamphlets, or other written materials from your doctor or pharmacy?: 1 - Never What is the last grade level you completed in school?: College 1 Yr  Interpreter Needed?: No  Information entered by ::   FNp-C  Past Medical History:  Diagnosis Date  . Anemia   . Cataract    traumatic  . Chronic diastolic heart failure ([place])   . Chronic kidney disease, stage III (moderate) (HCC)   . Depressive disorder, not elsewhere classified   . Depressive disorder, not elsewhere  classified   . Elevated prostate specific antigen (PSA)   . Elevated prostate specific antigen (PSA)   . Gingivitis   . 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 Hyperlipidemia   . Hypertension   . Hypertensive kidney disease, benign   . 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 Microscopic hematuria   . Nondependent cannabis abuse   . Pure hypercholesterolemia   . Unspecified late effects of cerebrovascular disease   . Unspecified late effects of cerebrovascular disease   . Unspecified vitamin D deficiency   . Unspecified vitamin D deficiency    Past Surgical History:  Procedure Laterality Date  . CATARACT EXTRACTION    . CORONARY STENT PLACEMENT     2008  . EYE SURGERY     left eye,    cornea repair  . PERIPHERAL VASCULAR CATHETERIZATION N/A 07/28/2016   Procedure: Lower Extremity Angiography;  Surgeon: Adrian Prows, MD;  Location: [facility];  Service: Cardiovascular;  Laterality: N/A;   Family History  Problem Relation Age of Onset  . Cancer Father    Social History   Socioeconomic History  . Marital status: Widowed    Spouse name: Not on file  . Number of children: 2  . Years of education: Not on file  . Highest education level: Not on file  Occupational History  . Not on file  Social Needs  . Financial resource strain: Not hard at all  . Food  insecurity    Worry: Never true    Inability: Never true  . Transportation needs    Medical: No    Non-medical: No  Tobacco Use  . Smoking status: Former Smoker    Packs/day: 0.50    Years: 13.00    Pack years: 6.50    Quit date: 03/09/1971    Years since quitting: 48.1  . Smokeless tobacco: Never Used  Substance and Sexual Activity  . Alcohol use: No    Alcohol/week: 0.0 standard drinks  . Drug use: No  . Sexual activity: Not on file  Lifestyle  . Physical activity    Days per week: 4 days     Minutes per session: 10 min  . Stress: Not at all  Relationships  . Social connections    Talks on phone: More than three times a week    Gets together: More than three times a week    Attends religious service: Never    Active member of club or organization: No    Attends meetings of clubs or organizations: Never    Relationship status: Divorced  Other Topics Concern  . Not on file  Social History Narrative  . Not on file    Outpatient Encounter Medications as of 04/25/2019  Medication Sig  . amLODipine (NORVASC) 5 MG tablet Take 1 tablet (5 mg total) by mouth daily.  Marland Kitchen aspirin 81 MG tablet Take 81 mg by mouth daily.    Marland Kitchen CALTRATE 600+D 600-800 MG-UNIT TABS TAKE 1 TABLET TWICE DAILY  . cilostazol (PLETAL) 100 MG tablet TAKE 1 TABLET TWICE DAILY  . ezetimibe (ZETIA) 10 MG tablet Take 10 mg by mouth daily.  . furosemide (LASIX) 20 MG tablet TAKE ONE TABLET BY MOUTH EVERY MORNING AND TAKE ONE TABLET BY MOUTH EVERY EVENING  . latanoprost (XALATAN) 0.005 % ophthalmic solution Place 1 drop into both eyes at bedtime.   Marland Kitchen losartan (COZAAR) 100 MG tablet TAKE 1 TABLET EVERY DAY  . pravastatin (PRAVACHOL) 80 MG tablet TAKE 1 TABLET EVERY DAY TO CONTROL CHOLESTEROL  . [EXPIRED] Tdap (ADACEL) 12-02-13.5 LF-MCG/0.5 injection Inject 0.5 mLs into the muscle once for 1 dose.  . timolol (BETIMOL) 0.5 % ophthalmic solution Place 1 drop into both eyes every morning.   . [EXPIRED] Zoster Vaccine Adjuvanted [facility]) injection Inject 0.5 mLs into the muscle once for 1 dose.  . [DISCONTINUED] Tdap (ADACEL) 12-02-13.5 LF-MCG/0.5 injection Inject 0.5 mLs into the muscle once.  . [DISCONTINUED] Zoster Vaccine Adjuvanted [facility]) injection Inject 0.5 mLs into the muscle once.   No facility-administered encounter medications on file as of 04/25/2019.     Activities of Daily Living In your present state of health, do you have any difficulty performing the following activities: 04/25/2019  Hearing? N  Vision?  N  Difficulty concentrating or making decisions? N  Walking or climbing stairs? N  Dressing or bathing? N  Doing errands, shopping? N  Preparing Food and eating ? N  Using the Toilet? N  In the past six months, have you accidently leaked urine? N  Do you have problems with loss of bowel control? N  Managing your Medications? N  Managing your Finances? N  Housekeeping or managing your Housekeeping? N  Some recent data might be hidden    Patient Care Team: Gayland Curry, DO as PCP - General (Geriatric Medicine) Adrian Prows, MD as Consulting Physician (Cardiology) Clent Jacks, MD as Consulting Physician (Ophthalmology) Mal Misty, MD (Inactive) as Consulting Physician (Vascular  Surgery) Garvin Fila, MD as Consulting Physician (Neurology) Hayden Pedro, MD as Consulting Physician (Ophthalmology)   Assessment:   This is a routine wellness examination for [name].  Exercise Activities and Dietary recommendations Current Exercise Habits: Home exercise routine, Type of exercise: Other - see comments(bicycle pedal), Time (Minutes): 15, Frequency (Times/Week): 3, Weekly Exercise (Minutes/Week): 45, Intensity: Moderate, Exercise limited by: None identified  Goals    . Maintain lifestyle     Patient will maintain Lifestyle       Fall Risk Fall Risk  04/25/2019 04/03/2019 12/01/2018 10/31/2018 09/01/2018  Falls in the past year? 0 0 0 0 0  Number falls in past yr: 0 0 0 0 0  Injury with Fall? 0 0 0 0 0   Is the patient's home free of loose throw rugs in walkways, pet beds, electrical cords, etc?   no      Grab bars in the bathroom? no      Handrails on the stairs?   no      Adequate lighting?   yes  Depression Screen PHQ 2/9 Scores 04/25/2019 04/03/2019 12/01/2018 10/31/2018  PHQ - 2 Score 0 0 0 0    Cognitive Function MMSE - Mini Mental State Exam 04/18/2018 03/10/2017 03/26/2016  Orientation to time 5 5 5   Orientation to Place 4 5 5   Registration 3 3 3   Attention/  Calculation 5 5 5   Recall 1 2 2   Language- name 2 objects 2 2 2   Language- repeat 1 1 1   Language- follow 3 step command 3 3 2   Language- read & follow direction 1 1 1   Write a sentence 1 1 1   Copy design 1 1 1   Total score 27 29 28      6CIT Screen 04/25/2019  What Year? 0 points  What month? 0 points  What time? 0 points  Count back from 20 0 points  Months in reverse 0 points    Immunization History  Administered Date(s) Administered  . Fluad Quad(high Dose 65+) 04/03/2019  . Influenza Split 04/29/2010  . Influenza, High Dose Seasonal PF 07/22/2017, 04/18/2018  . Influenza,inj,Quad PF,6+ Mos 06/05/2013, 07/09/2014, 06/14/2015  . Pneumococcal Conjugate-13 10/02/2013  . Pneumococcal Polysaccharide-23 11/05/2011    Qualifies for Shingles Vaccine? Ordered shingrex   Screening Tests Health Maintenance  Topic Date Due  . TETANUS/TDAP  03/14/1960  . INFLUENZA VACCINE  Completed  . PNA vac Low Risk Adult  Completed   Cancer Screenings: Lung: Low Dose CT Chest recommended if Age 63-80 years, 30 pack-year currently smoking OR have quit w/in 15years. Patient does not qualify. Colorectal: States follows up with a specialist.  Additional Screenings:  Hepatitis C Screening: low Risk    Plan:  - Tdap vaccine  - Shingrix vaccine   I have personally reviewed and noted the following in the patient's chart:   . Medical and social history . Use of alcohol, tobacco or illicit drugs  . Current medications and supplements . Functional ability and status . Nutritional status . Physical activity . Advanced directives . List of other physicians . Hospitalizations, surgeries, and ER visits in previous 12 months . Vitals . Screenings to include cognitive, depression, and falls . Referrals and appointments  In addition, I have reviewed and discussed with patient certain preventive protocols, quality metrics, and best practice recommendations. A written personalized care plan for  preventive services as well as general preventive health recommendations were provided to patient.    Sandrea Hughs, NP  05/01/2019  

## 2019-04-25 NOTE — Progress Notes (Signed)
This service is provided via telemedicine  No vital signs collected/recorded due to the encounter was a telemedicine visit.   Location of patient (ex: home, work):  Home   Patient consents to a telephone visit:  Yes  Location of the provider (ex: office, home):  Office   Name of any referring provider: Hollace Kinnier, DO  Names of all persons participating in the telemedicine service and their role in the encounter:  Marlowe Sax, NP, Ruthell Rummage CMA, Ivar Bury   Time spent on call:  Ruthell Rummage CMA, spent 10 minutes on phone with patient.

## 2019-04-26 DIAGNOSIS — R4189 Other symptoms and signs involving cognitive functions and awareness: Secondary | ICD-10-CM | POA: Diagnosis not present

## 2019-04-26 DIAGNOSIS — M5136 Other intervertebral disc degeneration, lumbar region: Secondary | ICD-10-CM | POA: Diagnosis not present

## 2019-04-26 DIAGNOSIS — Z9189 Other specified personal risk factors, not elsewhere classified: Secondary | ICD-10-CM | POA: Diagnosis not present

## 2019-04-26 DIAGNOSIS — Z8673 Personal history of transient ischemic attack (TIA), and cerebral infarction without residual deficits: Secondary | ICD-10-CM | POA: Diagnosis not present

## 2019-05-01 NOTE — Addendum Note (Signed)
Addended byMarlowe Sax C on: 05/01/2019 11:09 AM   Modules accepted: Level of Service

## 2019-05-08 ENCOUNTER — Ambulatory Visit (INDEPENDENT_AMBULATORY_CARE_PROVIDER_SITE_OTHER): Payer: Medicare HMO | Admitting: Internal Medicine

## 2019-05-08 ENCOUNTER — Encounter: Payer: Self-pay | Admitting: Internal Medicine

## 2019-05-08 ENCOUNTER — Other Ambulatory Visit: Payer: Self-pay

## 2019-05-08 VITALS — BP 162/74 | HR 105 | Temp 97.8°F | Resp 20 | Ht 69.0 in | Wt 162.2 lb

## 2019-05-08 DIAGNOSIS — G3184 Mild cognitive impairment, so stated: Secondary | ICD-10-CM | POA: Diagnosis not present

## 2019-05-08 DIAGNOSIS — Z23 Encounter for immunization: Secondary | ICD-10-CM | POA: Diagnosis not present

## 2019-05-08 DIAGNOSIS — I499 Cardiac arrhythmia, unspecified: Secondary | ICD-10-CM

## 2019-05-08 DIAGNOSIS — I779 Disorder of arteries and arterioles, unspecified: Secondary | ICD-10-CM

## 2019-05-08 DIAGNOSIS — N1831 Chronic kidney disease, stage 3a: Secondary | ICD-10-CM | POA: Diagnosis not present

## 2019-05-08 DIAGNOSIS — I351 Nonrheumatic aortic (valve) insufficiency: Secondary | ICD-10-CM | POA: Diagnosis not present

## 2019-05-08 DIAGNOSIS — I110 Hypertensive heart disease with heart failure: Secondary | ICD-10-CM | POA: Diagnosis not present

## 2019-05-08 DIAGNOSIS — Z9189 Other specified personal risk factors, not elsewhere classified: Secondary | ICD-10-CM | POA: Diagnosis not present

## 2019-05-08 DIAGNOSIS — I5033 Acute on chronic diastolic (congestive) heart failure: Secondary | ICD-10-CM | POA: Diagnosis not present

## 2019-05-08 MED ORDER — TETANUS-DIPHTH-ACELL PERTUSSIS 5-2.5-18.5 LF-MCG/0.5 IM SUSP: 0.5000 mL | Freq: Once | INTRAMUSCULAR | 0 refills | Status: AC

## 2019-05-08 MED ORDER — PRAVASTATIN SODIUM 80 MG PO TABS: ORAL_TABLET | ORAL | 1 refills | Status: DC

## 2019-05-08 NOTE — Patient Instructions (Addendum)
Please contact the DMV to arrange for your testing to obtain your license again.  You may not drive until you have a new license.    It's important that you comply with your medications.      [facility] on [address] To get you Tdap vaccine (tetanus booster).  You were not in atrial fibrillation as I was concerned about on the EKG.  You do have extra beats "PVCs" which are a common problem.

## 2019-05-08 NOTE — Progress Notes (Signed)
Location:  [facility]   Advanced Directives 04/25/2019  Does Patient Have a Medical Advance Directive? No  Would patient like information on creating a medical advance directive? Yes (ED - Information included in AVS)     Chief Complaint  Patient presents with  . Medical Management of Chronic Issues     ? getting license back    HPI: Patient is a 78 y.o. male seen today for medical management of chronic diseases.    He states he is taking his medications daily. Uses a pill box to help remember his medications. Cannot recall what medication he took this morning. States he has not taken all of his medication this morning. Denies chest pain or shortness of breath. No recent dizziness episodes. No recent falls or injuries reported.   Claudication occurs when walking for a prolonged period of time. Denies that it has gotten worse. When these episodes occur, he rests until the pain subsides.Tries to limit activity because of this and will use motorized scooters out in public when available.   Received flu vaccination in the past few weeks.   Went to occupational therapist on September 16th for driving evaluation. He is eager to drive. Documentation was forwarded to office.    Past Medical History:  Diagnosis Date  . Anemia   . Cataract    traumatic  . Chronic diastolic heart failure ([place])   . Chronic kidney disease, stage III (moderate)   . Depressive disorder, not elsewhere classified   . Depressive disorder, not elsewhere classified   . Elevated prostate specific antigen (PSA)   . Elevated prostate specific antigen (PSA)   . Gingivitis   . 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 Hyperlipidemia   . Hypertension   . Hypertensive kidney disease, benign   . 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 Microscopic hematuria   . Nondependent cannabis abuse   . Pure  hypercholesterolemia   . Unspecified late effects of cerebrovascular disease   . Unspecified late effects of cerebrovascular disease   . Unspecified vitamin D deficiency   . Unspecified vitamin D deficiency     Past Surgical History:  Procedure Laterality Date  . CATARACT EXTRACTION    . CORONARY STENT PLACEMENT     2008  . EYE SURGERY     left eye,    cornea repair  . PERIPHERAL VASCULAR CATHETERIZATION N/A 07/28/2016   Procedure: Lower Extremity Angiography;  Surgeon: Adrian Prows, MD;  Location: [facility];  Service: Cardiovascular;  Laterality: N/A;    Allergies  Allergen Reactions  . Penicillins Other (See Comments)    Unknown  Has patient had a PCN reaction causing immediate rash, facial/tongue/throat swelling, SOB or lightheadedness with hypotension: {unknown Has patient had a PCN reaction causing severe rash involving mucus membranes or skin necrosis: unknown Has patient had a PCN reaction that required hospitalization {unknown Has patient had a PCN reaction occurring within the last 10 years: no If all of the above answers are "NO", then may proceed with Cephalosporin use.    Outpatient Encounter Medications as of 05/08/2019  Medication Sig  . amLODipine (NORVASC) 5 MG tablet Take 1 tablet (5 mg total) by mouth daily.  Marland Kitchen aspirin 81 MG tablet Take 81 mg by mouth daily.    Marland Kitchen CALTRATE 600+D 600-800 MG-UNIT TABS TAKE 1 TABLET TWICE DAILY  . cilostazol (PLETAL) 100 MG tablet  TAKE 1 TABLET TWICE DAILY  . ezetimibe (ZETIA) 10 MG tablet Take 10 mg by mouth daily.  . furosemide (LASIX) 20 MG tablet TAKE ONE TABLET BY MOUTH EVERY MORNING AND TAKE ONE TABLET BY MOUTH EVERY EVENING  . latanoprost (XALATAN) 0.005 % ophthalmic solution Place 1 drop into both eyes at bedtime.   Marland Kitchen losartan (COZAAR) 100 MG tablet TAKE 1 TABLET EVERY DAY  . pravastatin (PRAVACHOL) 80 MG tablet TAKE 1 TABLET EVERY DAY TO CONTROL CHOLESTEROL  . timolol (BETIMOL) 0.5 % ophthalmic solution Place 1  drop into both eyes every morning.   . [DISCONTINUED] pravastatin (PRAVACHOL) 80 MG tablet TAKE 1 TABLET EVERY DAY TO CONTROL CHOLESTEROL   No facility-administered encounter medications on file as of 05/08/2019.     Review of Systems:  Review of Systems  Constitutional: Negative for activity change, appetite change and fever.  HENT: Positive for dental problem. Negative for hearing loss and trouble swallowing.   Eyes: Positive for photophobia and visual disturbance.  Respiratory: Negative for cough, shortness of breath and wheezing.   Cardiovascular: Negative for chest pain and leg swelling.       Claudication  Gastrointestinal: Negative for abdominal pain, constipation, diarrhea and nausea.  Endocrine: Negative for polydipsia, polyphagia and polyuria.  Genitourinary: Negative for dysuria, frequency and hematuria.  Musculoskeletal: Positive for back pain.  Skin: Negative for rash and wound.  Neurological: Negative for dizziness and headaches.  Psychiatric/Behavioral: Negative for dysphoric mood and sleep disturbance. The patient is not nervous/anxious.        Memory impairment    Health Maintenance  Topic Date Due  . TETANUS/TDAP  03/14/1960  . INFLUENZA VACCINE  Completed  . PNA vac Low Risk Adult  Completed    Physical Exam: Vitals:   05/08/19 0901  BP: (!) 162/74  Pulse: (!) 105  Resp: 20  Temp: 97.8 F (36.6 C)  SpO2: 98%  Weight: 162 lb 3.2 oz (73.6 kg)  Height: 5\' 9"  (1.753 m)   Body mass index is 23.95 kg/m. Physical Exam Vitals signs reviewed.  Constitutional:      Appearance: Normal appearance.  HENT:     Head: Normocephalic.  Cardiovascular:     Rate and Rhythm: Normal rate. Rhythm irregular.     Pulses: Normal pulses.     Heart sounds: Murmur present.  Pulmonary:     Effort: Pulmonary effort is normal.     Breath sounds: Normal breath sounds.  Abdominal:     General: Abdomen is flat. Bowel sounds are normal.     Palpations: Abdomen is soft.   Musculoskeletal:     Right lower leg: No edema.     Left lower leg: No edema.  Skin:    General: Skin is warm and dry.     Capillary Refill: Capillary refill takes less than 2 seconds.  Neurological:     General: No focal deficit present.     Mental Status: He is alert and oriented to person, place, and time. Mental status is at baseline.  Psychiatric:        Mood and Affect: Mood normal.        Behavior: Behavior normal.        Thought Content: Thought content normal.        Judgment: Judgment normal.     Labs reviewed: Basic Metabolic Panel: Recent Labs    07/21/18 1532 12/05/18 0834  NA 139 140  K 5.0 4.5  CL 109 106  CO2 24 29  GLUCOSE 91 105*  BUN 43* 39*  CREATININE 1.52* 1.50*  CALCIUM 9.8 9.9   Liver Function Tests: Recent Labs    12/05/18 0834  AST 16  ALT 9  BILITOT 0.3  PROT 7.7   No results for input(s): LIPASE, AMYLASE in the last 8760 hours. No results for input(s): AMMONIA in the last 8760 hours. CBC: Recent Labs    12/05/18 0834  WBC 5.5  NEUTROABS 3,124  HGB 11.4*  HCT 34.4*  MCV 90.8  PLT 271   Lipid Panel: Recent Labs    12/05/18 0834  CHOL 147  HDL 55  LDLCALC 79  TRIG 54  CHOLHDL 2.7   Lab Results  Component Value Date   HGBA1C 6.2 (H) 12/05/2018    Procedures since last visit: No results found.  Assessment/Plan 1. Driving safety issue - occupational therapist approves patient driving - advised patient to speak with DMV about next step in obtaining his drivers license  2. 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 Einar Gip - continue Pletal  3. Chronic diastolic heart failure (HCC) - no signs of exacerbation - bp is elevated today, he has not taken all of his medications - he states he is nervous about getting his license back - complete blood panel with differential/platelets- future  4. Chronic kidney disease, stage III (moderate) (HCC) -  continue to avoid nephrotoxic medications like NSAIDS and dose adjust renally excreted medications - encourage hydration - complete metabolic panel with GFR-future  5. Hyperglycemia - encourage diet  That limits sugars and carb -hemoglobin A1C- future  6. Pure hypercholesterolemia - continue zetia and pravachol - limit foods high in fat - lipid panel- future  7. Irregular heartbeat - 12 lead EKG reveals sinus tachycardia with PVC's - denies fatigue or shortness of breath - he has not taken all his blood pressure medications this morning due to history of orthostatic hypotension   Labs/tests ordered: 12-lead EKG, complete blood panel with differential/platelets, complete metabolic panel with GFR, hemoglobin A1C- future Next appt:  07/20/2019

## 2019-05-16 ENCOUNTER — Telehealth: Payer: Self-pay | Admitting: *Deleted

## 2019-05-16 NOTE — Telephone Encounter (Signed)
Received paperwork For [facility] Medical Review for patient's Driver's License. Placed in Dr. Cyndi Lennert folder to review, fill out and sign.  Patient wants a call when completed [phone]  DMV [phone] Fax: [phone]

## 2019-05-23 ENCOUNTER — Other Ambulatory Visit: Payer: Self-pay | Admitting: Internal Medicine

## 2019-05-29 ENCOUNTER — Encounter: Payer: Self-pay | Admitting: Internal Medicine

## 2019-05-29 DIAGNOSIS — Z029 Encounter for administrative examinations, unspecified: Secondary | ICD-10-CM

## 2019-05-29 NOTE — Telephone Encounter (Signed)
Form completed.  Please copy and fax original to [facility].  I did charge him for it b/c it took me a half hour to do it.

## 2019-05-30 NOTE — Telephone Encounter (Signed)
Informed patient of Paperwork ready and that there was a charge of $50. He stated that he will pay when he picks up the forms. He will mail them to [facility].   Copy made of the paperwork and placed for scanning.  Original left up front in drawer for patient pick up and charge.

## 2019-05-31 ENCOUNTER — Telehealth: Payer: Self-pay

## 2019-05-31 NOTE — Telephone Encounter (Signed)
I suppose that may be ok if he brings it back, I can revise.

## 2019-05-31 NOTE — Telephone Encounter (Signed)
Patient states he picked up his paperwork for the DMV, but realized nighttime driving was restricted. He wants that changed. He is joining a [employer] group and they have meetings at night. He says he would greatly appreciate if this could be changed.

## 2019-06-02 NOTE — Telephone Encounter (Signed)
Discussed with the patient. He will try to bring it back asap.

## 2019-06-15 ENCOUNTER — Other Ambulatory Visit: Payer: Self-pay

## 2019-06-15 ENCOUNTER — Ambulatory Visit (INDEPENDENT_AMBULATORY_CARE_PROVIDER_SITE_OTHER): Payer: Medicare HMO | Admitting: Internal Medicine

## 2019-06-15 ENCOUNTER — Encounter: Payer: Self-pay | Admitting: Internal Medicine

## 2019-06-15 VITALS — BP 160/70 | HR 66 | Temp 97.6°F | Ht 69.0 in | Wt 163.0 lb

## 2019-06-15 DIAGNOSIS — Z23 Encounter for immunization: Secondary | ICD-10-CM

## 2019-06-15 DIAGNOSIS — I10 Essential (primary) hypertension: Secondary | ICD-10-CM | POA: Diagnosis not present

## 2019-06-15 DIAGNOSIS — G3184 Mild cognitive impairment, so stated: Secondary | ICD-10-CM | POA: Diagnosis not present

## 2019-06-15 DIAGNOSIS — T148XXA Other injury of unspecified body region, initial encounter: Secondary | ICD-10-CM | POA: Diagnosis not present

## 2019-06-15 MED ORDER — TETANUS-DIPHTH-ACELL PERTUSSIS 5-2.5-18.5 LF-MCG/0.5 IM SUSP: 0.5000 mL | Freq: Once | INTRAMUSCULAR | 0 refills | Status: AC

## 2019-06-15 NOTE — Progress Notes (Signed)
Location:  [facility] clinic Provider: Andreas Sobolewski L. Mariea Clonts, D.O., C.M.D.  Goals of Care:  Advanced Directives 06/15/2019  Does Patient Have a Medical Advance Directive? No  Would patient like information on creating a medical advance directive? Yes (MAU/Ambulatory/Procedural Areas - Information given)     Chief Complaint  Patient presents with  . Acute Visit    Resolving bruise left waist, no pain, no injury  . Best Practice Recommendations    Needs Tdap - order sent to pharmacy today    HPI: Patient is a 78 y.o. male seen today for an acute visit for left waist region bruising that was getting darker.  Now reports it's better.  BP high.  Says his bp is normal at home.   He has not been "eating my pineapple".   Avoids adding salt and does not eat pork.  Reports taking all of his medications as ordered.   He's had the bruise at the bottom of his left ribs that came up spontaneously about 3 weeks ago.  It is not painful and he is not aware of any injury.  He is on pletal and aspirin.  He has a hematoma beneath the skin in the area the size of a quarter approximately.  He does not lie on that side.  He does not fall.  It's actually getting better now.    Is upset that I put limitations on his driving when I completed the [facility] paperwork after his OT evaluation.  He has not yet heard from the [facility] since paperwork was sent in.  Past Medical History:  Diagnosis Date  . Anemia   . Cataract    traumatic  . Chronic diastolic heart failure ([place])   . Chronic kidney disease, stage III (moderate)   . Depressive disorder, not elsewhere classified   . Depressive disorder, not elsewhere classified   . Elevated prostate specific antigen (PSA)   . Elevated prostate specific antigen (PSA)   . Gingivitis   . 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 Hyperlipidemia   . Hypertension   . Hypertensive kidney disease, benign   . Hypertrophy of prostate with  urinary obstruction and other lower urinary tract symptoms (LUTS)   . Microscopic hematuria   . Nondependent cannabis abuse   . Pure hypercholesterolemia   . Unspecified late effects of cerebrovascular disease   . Unspecified late effects of cerebrovascular disease   . Unspecified vitamin D deficiency   . Unspecified vitamin D deficiency     Past Surgical History:  Procedure Laterality Date  . CATARACT EXTRACTION    . CORONARY STENT PLACEMENT     2008  . EYE SURGERY     left eye,    cornea repair  . PERIPHERAL VASCULAR CATHETERIZATION N/A 07/28/2016   Procedure: Lower Extremity Angiography;  Surgeon: Adrian Prows, MD;  Location: [facility];  Service: Cardiovascular;  Laterality: N/A;    Allergies  Allergen Reactions  . Penicillins Other (See Comments)    Unknown  Has patient had a PCN reaction causing immediate rash, facial/tongue/throat swelling, SOB or lightheadedness with hypotension: {unknown Has patient had a PCN reaction causing severe rash involving mucus membranes or skin necrosis: unknown Has patient had a PCN reaction that required hospitalization {unknown Has patient had a PCN reaction occurring within the last 10 years: no If all of the above answers are "NO", then may proceed with Cephalosporin use.    Outpatient Encounter Medications as  of 06/15/2019  Medication Sig  . amLODipine (NORVASC) 5 MG tablet TAKE 1 TABLET EVERY DAY  . aspirin 81 MG tablet Take 81 mg by mouth daily.    Marland Kitchen CALTRATE 600+D 600-800 MG-UNIT TABS TAKE 1 TABLET TWICE DAILY  . cilostazol (PLETAL) 100 MG tablet TAKE 1 TABLET TWICE DAILY  . ezetimibe (ZETIA) 10 MG tablet Take 10 mg by mouth daily.  . furosemide (LASIX) 20 MG tablet TAKE ONE TABLET BY MOUTH EVERY MORNING AND TAKE ONE TABLET BY MOUTH EVERY EVENING  . latanoprost (XALATAN) 0.005 % ophthalmic solution Place 1 drop into both eyes at bedtime.   Marland Kitchen losartan (COZAAR) 100 MG tablet TAKE 1 TABLET EVERY DAY  . pravastatin (PRAVACHOL)  80 MG tablet TAKE 1 TABLET EVERY DAY TO CONTROL CHOLESTEROL  . Tdap (BOOSTRIX) 5-2.5-18.5 LF-MCG/0.5 injection Inject 0.5 mLs into the muscle once for 1 dose.  . timolol (BETIMOL) 0.5 % ophthalmic solution Place 1 drop into both eyes every morning.   . [DISCONTINUED] Tdap (BOOSTRIX) 5-2.5-18.5 LF-MCG/0.5 injection Inject 0.5 mLs into the muscle once.   No facility-administered encounter medications on file as of 06/15/2019.     Review of Systems:  Review of Systems  Constitutional: Negative for chills and fever.  Eyes: Negative for blurred vision.  Respiratory: Negative for shortness of breath.   Cardiovascular: Negative for chest pain, palpitations and leg swelling.  Gastrointestinal: Negative for abdominal pain.  Genitourinary: Negative for dysuria.  Musculoskeletal: Negative for falls and joint pain.  Skin: Negative for itching and rash.  Neurological: Negative for dizziness and loss of consciousness.  Endo/Heme/Allergies: Bruises/bleeds easily (bruise left lower quadrant).  Psychiatric/Behavioral: Positive for memory loss. Negative for depression. The patient is not nervous/anxious and does not have insomnia.     Health Maintenance  Topic Date Due  . TETANUS/TDAP  03/14/1960  . INFLUENZA VACCINE  Completed  . PNA vac Low Risk Adult  Completed    Physical Exam: Vitals:   06/15/19 1424 06/15/19 1449  BP: (!) 180/62 (!) 160/70  Pulse: 66   Temp: 97.6 F (36.4 C)   TempSrc: Oral   SpO2: 98%   Weight: 163 lb (73.9 kg)   Height: 5\' 9"  (1.753 m)    Body mass index is 24.07 kg/m. Physical Exam Vitals signs reviewed.  Constitutional:      Appearance: Normal appearance.  HENT:     Head: Normocephalic and atraumatic.  Cardiovascular:     Rate and Rhythm: Normal rate and regular rhythm.     Heart sounds: Murmur present.  Pulmonary:     Effort: Pulmonary effort is normal.     Breath sounds: Normal breath sounds.  Abdominal:     General: Bowel sounds are normal. There  is no distension.     Palpations: Abdomen is soft.     Tenderness: There is no abdominal tenderness.     Comments: Hematoma and surrounding ecchymoses on left lower quadrant area of abdomen in fatty tissue  Neurological:     Mental Status: He is alert.     Labs reviewed: Basic Metabolic Panel: Recent Labs    07/21/18 1532 12/05/18 0834  NA 139 140  K 5.0 4.5  CL 109 106  CO2 24 29  GLUCOSE 91 105*  BUN 43* 39*  CREATININE 1.52* 1.50*  CALCIUM 9.8 9.9   Liver Function Tests: Recent Labs    12/05/18 0834  AST 16  ALT 9  BILITOT 0.3  PROT 7.7   No results for input(s): LIPASE,  AMYLASE in the last 8760 hours. No results for input(s): AMMONIA in the last 8760 hours. CBC: Recent Labs    12/05/18 0834  WBC 5.5  NEUTROABS 3,124  HGB 11.4*  HCT 34.4*  MCV 90.8  PLT 271   Lipid Panel: Recent Labs    12/05/18 0834  CHOL 147  HDL 55  LDLCALC 79  TRIG 54  CHOLHDL 2.7   Lab Results  Component Value Date   HGBA1C 6.2 (H) 12/05/2018    Procedures since last visit: No results found.  Assessment/Plan 1. Hematoma and contusion - unclear what from as he is not on new meds; he does not recall injury - check basic labs to eval coagulability - CBC with Differential/Platelet - APTT - Protime-INR  2. Mild cognitive impairment with memory loss -affects response time and driving safety so limited driving to 10 miles of home and less than 35mph  3. Essential hypertension -bp high here in office, but reports normal at home so advised to bring home readings next time  4. Need for diphtheria-tetanus-pertussis (Tdap) vaccine -sent Rx to pharmacy again - Tdap ([place]) 5-2.5-18.5 LF-MCG/0.5 injection; Inject 0.5 mLs into the muscle once for 1 dose.  Dispense: 0.5 mL; Refill: 0  Labs/tests ordered:   Lab Orders     CBC with Differential/Platelet     APTT     Protime-INR  Next appt:  07/20/2019  Maiana Hennigan L. Sequoia Mincey, D.O. [facility] [address], [place] [address] Cell Phone (Mon-Fri 8am-5pm):  [phone] On Call:  [phone] & follow prompts after 5pm & weekends Office Phone:  [phone] Office Fax:  [phone]

## 2019-06-16 LAB — CBC WITH DIFFERENTIAL/PLATELET
Absolute Monocytes: 638 cells/uL (ref 200–950)
Basophils Absolute: 41 cells/uL (ref 0–200)
Basophils Relative: 0.8 %
Eosinophils Absolute: 734 cells/uL — ABNORMAL HIGH (ref 15–500)
Eosinophils Relative: 14.4 %
HCT: 38.8 % (ref 38.5–50.0)
Hemoglobin: 12.3 g/dL — ABNORMAL LOW (ref 13.2–17.1)
Lymphs Abs: 1153 cells/uL (ref 850–3900)
MCH: 28.1 pg (ref 27.0–33.0)
MCHC: 31.7 g/dL — ABNORMAL LOW (ref 32.0–36.0)
MCV: 88.6 fL (ref 80.0–100.0)
MPV: 9.8 fL (ref 7.5–12.5)
Monocytes Relative: 12.5 %
Neutro Abs: 2535 cells/uL (ref 1500–7800)
Neutrophils Relative %: 49.7 %
Platelets: 261 10*3/uL (ref 140–400)
RBC: 4.38 10*6/uL (ref 4.20–5.80)
RDW: 13.1 % (ref 11.0–15.0)
Total Lymphocyte: 22.6 %
WBC: 5.1 10*3/uL (ref 3.8–10.8)

## 2019-06-16 LAB — PROTIME-INR
INR: 1
Prothrombin Time: 10.4 s (ref 9.0–11.5)

## 2019-06-16 LAB — APTT: aPTT: 31 s (ref 23–32)

## 2019-06-16 NOTE — Addendum Note (Signed)
Addended by: Hollace Kinnier L on: 06/16/2019 10:55 AM   Modules accepted: Orders

## 2019-06-21 ENCOUNTER — Other Ambulatory Visit: Payer: Self-pay | Admitting: Cardiology

## 2019-07-10 ENCOUNTER — Ambulatory Visit
Admission: RE | Admit: 2019-07-10 | Discharge: 2019-07-10 | Disposition: A | Discharge status: Home / Self Care | Payer: Medicare HMO | Source: Ambulatory Visit | Attending: Internal Medicine | Admitting: Internal Medicine

## 2019-07-10 ENCOUNTER — Other Ambulatory Visit: Payer: Self-pay | Admitting: Internal Medicine

## 2019-07-10 DIAGNOSIS — S301XXA Contusion of abdominal wall, initial encounter: Secondary | ICD-10-CM | POA: Diagnosis not present

## 2019-07-10 DIAGNOSIS — T148XXA Other injury of unspecified body region, initial encounter: Secondary | ICD-10-CM

## 2019-07-20 ENCOUNTER — Other Ambulatory Visit: Payer: Self-pay

## 2019-07-20 ENCOUNTER — Ambulatory Visit (INDEPENDENT_AMBULATORY_CARE_PROVIDER_SITE_OTHER): Payer: Medicare HMO | Admitting: Internal Medicine

## 2019-07-20 ENCOUNTER — Encounter: Payer: Self-pay | Admitting: Internal Medicine

## 2019-07-20 DIAGNOSIS — E7800 Pure hypercholesterolemia, unspecified: Secondary | ICD-10-CM | POA: Diagnosis not present

## 2019-07-20 DIAGNOSIS — G3184 Mild cognitive impairment, so stated: Secondary | ICD-10-CM

## 2019-07-20 DIAGNOSIS — R739 Hyperglycemia, unspecified: Secondary | ICD-10-CM | POA: Diagnosis not present

## 2019-07-20 DIAGNOSIS — I10 Essential (primary) hypertension: Secondary | ICD-10-CM | POA: Diagnosis not present

## 2019-07-20 DIAGNOSIS — T148XXA Other injury of unspecified body region, initial encounter: Secondary | ICD-10-CM | POA: Diagnosis not present

## 2019-07-20 DIAGNOSIS — N1831 Chronic kidney disease, stage 3a: Secondary | ICD-10-CM
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
## 2019-07-24 ENCOUNTER — Encounter: Payer: Self-pay | Admitting: Cardiology

## 2019-07-24 ENCOUNTER — Other Ambulatory Visit: Payer: Self-pay

## 2019-07-24 ENCOUNTER — Ambulatory Visit (INDEPENDENT_AMBULATORY_CARE_PROVIDER_SITE_OTHER): Payer: Medicare HMO | Admitting: Cardiology

## 2019-07-24 VITALS — BP 130/58 | HR 77 | Temp 98.0°F | Ht 69.0 in | Wt 163.0 lb

## 2019-07-24 DIAGNOSIS — I10 Essential (primary) hypertension: Secondary | ICD-10-CM

## 2019-07-24 DIAGNOSIS — I351 Nonrheumatic aortic (valve) insufficiency: Secondary | ICD-10-CM

## 2019-07-24 DIAGNOSIS — I129 Hypertensive chronic kidney disease with stage 1 through stage 4 chronic kidney disease, or unspecified chronic kidney disease: Secondary | ICD-10-CM | POA: Diagnosis not present

## 2019-07-24 DIAGNOSIS — I350 Nonrheumatic aortic (valve) stenosis: Secondary | ICD-10-CM

## 2019-07-24 DIAGNOSIS — N1831 Chronic kidney disease, stage 3a: Secondary | ICD-10-CM | POA: Diagnosis not present

## 2019-07-24 DIAGNOSIS — I779 Disorder of arteries and arterioles, unspecified: Secondary | ICD-10-CM

## 2019-07-24 NOTE — Progress Notes (Signed)
Primary Physician/Referring:  Gayland Curry, DO  Patient ID: [name], male    DOB: [birth year], 78 y.o.   MRN: [id number]  Chief Complaint  Patient presents with  . Hypertension  . Aortic Stenosis  . Follow-up    64mo    HPI: NYEEM STOKE  is a 78 y.o. 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 He is presently doing well, has chronic back pain and also chronic symptoms of claudication both neurogenic and vascular.  Denies any worsening dyspnea, no chest pain or palpitations.   He is on chronic dual antiplatelet therapy. He is tolerating all his medications well without any side effects.   Past Medical History:  Diagnosis Date  . Anemia   . Cataract    traumatic  . Chronic diastolic heart failure ([place])   . Chronic kidney disease, stage III (moderate)   . Depressive disorder, not elsewhere classified   . Depressive disorder, not elsewhere classified   . Elevated prostate specific antigen (PSA)   . Elevated prostate specific antigen (PSA)   . Gingivitis   . 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 Hyperlipidemia   . Hypertension   . Hypertensive kidney disease, benign   . 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 Microscopic hematuria   . Nondependent cannabis abuse   . Pure hypercholesterolemia   . Unspecified late effects of cerebrovascular disease   . Unspecified late effects of cerebrovascular disease   . Unspecified vitamin D deficiency   . Unspecified vitamin D deficiency     Past Surgical History:  Procedure Laterality Date  . CATARACT EXTRACTION    . CORONARY STENT PLACEMENT     2008  . EYE SURGERY     left eye,    cornea repair  .  PERIPHERAL VASCULAR CATHETERIZATION N/A 07/28/2016   Procedure: Lower Extremity Angiography;  Surgeon: Adrian Prows, MD;  Location: [facility];  Service: Cardiovascular;  Laterality: N/A;    Social History   Socioeconomic History  . Marital status: Widowed    Spouse name: Not on file  . Number of children: 2  . Years of education: Not on file  . Highest education level: Not on file  Occupational History  . Not on file  Tobacco Use  . Smoking status: Former Smoker    Packs/day: 0.50    Years: 13.00    Pack years: 6.50    Quit date: 03/09/1971    Years since quitting: 48.4  . Smokeless tobacco: Never Used  Substance and Sexual Activity  . Alcohol use: No    Alcohol/week: 0.0 standard drinks  . Drug use: No  . Sexual activity: Not on file  Other Topics Concern  . Not on file  Social History Narrative  . Not on file   Social Determinants of Health   Financial Resource Strain:   . Difficulty of Paying Living Expenses: Not on file  Food Insecurity:   . Worried About Charity fundraiser in the Last Year: Not on file  . Ran Out of Food in the Last Year: Not on file  Transportation Needs:   . Lack of Transportation (Medical): Not on file  . Lack of Transportation (  Non-Medical): Not on file  Physical Activity:   . Days of Exercise per Week: Not on file  . Minutes of Exercise per Session: Not on file  Stress:   . Feeling of Stress : Not on file  Social Connections:   . Frequency of Communication with Friends and Family: Not on file  . Frequency of Social Gatherings with Friends and Family: Not on file  . Attends Religious Services: Not on file  . Active Member of Clubs or Organizations: Not on file  . Attends Archivist Meetings: Not on file  . Marital Status: Not on file  Intimate Partner Violence:   . Fear of Current or Ex-Partner: Not on file  . Emotionally Abused: Not on file  . Physically Abused: Not on file  . Sexually Abused: Not on file   Review  of Systems  Constitution: Negative for chills, decreased appetite, malaise/fatigue and weight gain.  Cardiovascular: Positive for claudication (bilateral calf and also night cramps). Negative for dyspnea on exertion, leg swelling and syncope.  Endocrine: Negative for cold intolerance.  Hematologic/Lymphatic: Does not bruise/bleed easily.  Musculoskeletal: Positive for muscle cramps and muscle weakness (right sided weakness from stroke). Negative for joint swelling.  Gastrointestinal: Negative for abdominal pain, anorexia and change in bowel habit.  Neurological: Positive for focal weakness (right hemiparesis). Negative for headaches and light-headedness.  Psychiatric/Behavioral: Negative for depression and substance abuse.  All other systems reviewed and are negative.     Objective  Blood pressure (!) 130/58, pulse 77, temperature 98 F (36.7 C), height 5\' 9"  (1.753 m), weight 163 lb (73.9 kg), SpO2 93 %. Body mass index is 24.07 kg/m.    Physical Exam  Constitutional: He appears well-developed and well-nourished. No distress.  HENT:  Head: Atraumatic.  Eyes: Conjunctivae are normal.  Neck: No JVD present. No thyromegaly present.  Cardiovascular: Normal rate, regular rhythm, S1 normal and S2 normal. Exam reveals no gallop.  Murmur heard.  Early systolic murmur is present with a grade of 2/6 at the upper right sternal border radiating to the neck. High-pitched blowing decrescendo early diastolic murmur is present with a grade of 3/6 at the upper right sternal border radiating to the apex. Pulses:      Carotid pulses are on the right side with bruit and on the left side with bruit.      Femoral pulses are 2+ on the right side and 2+ on the left side.      Popliteal pulses are 1+ on the right side and 2+ on the left side.       Dorsalis pedis pulses are 0 on the right side and 0 on the left side.       Posterior tibial pulses are 0 on the right side and 0 on the left side.  Bilateral  legs loss of hair. Capillary refill normal. No ulceration. No edema.  Pulmonary/Chest: Effort normal and breath sounds normal.  Abdominal: Soft. Bowel sounds are normal.  Musculoskeletal:        General: Normal range of motion.     Cervical back: Neck supple.  Neurological: He is alert.  Skin: Skin is warm and dry.  Psychiatric: He has a normal mood and affect.   Radiology: No results found.  Laboratory examination:   CMP Latest Ref Rng & Units 12/05/2018 07/21/2018 04/18/2018  Glucose 65 - 99 mg/dL 105(H) 91 99  BUN 7 - 25 mg/dL 39(H) 43(H) 26(H)  Creatinine 0.70 - 1.18 mg/dL 1.50(H) 1.52(H) 1.10  Sodium 135 - 146 mmol/L 140 139 142  Potassium 3.5 - 5.3 mmol/L 4.5 5.0 5.0  Chloride 98 - 110 mmol/L 106 109 110  CO2 20 - 32 mmol/L 29 24 25   Calcium 8.6 - 10.3 mg/dL 9.9 9.8 9.7  Total Protein 6.1 - 8.1 g/dL 7.7 - 7.0  Total Bilirubin 0.2 - 1.2 mg/dL 0.3 - 0.4  Alkaline Phos 40 - 115 U/L - - -  AST 10 - 35 U/L 16 - 24  ALT 9 - 46 U/L 9 - 15   CBC Latest Ref Rng & Units 06/15/2019 12/05/2018 04/18/2018  WBC 3.8 - 10.8 Thousand/uL 5.1 5.5 5.2  Hemoglobin 13.2 - 17.1 g/dL 12.3(L) 11.4(L) 12.7(L)  Hematocrit 38.5 - 50.0 % 38.8 34.4(L) 39.3  Platelets 140 - 400 Thousand/uL 261 271 220   Lipid Panel     Component Value Date/Time   CHOL 147 12/05/2018 0834   CHOL 143 09/20/2015 0855   TRIG 54 12/05/2018 0834   HDL 55 12/05/2018 0834   HDL 48 09/20/2015 0855   CHOLHDL 2.7 12/05/2018 0834   VLDL 13 03/23/2016 0820   LDLCALC 79 12/05/2018 0834   HEMOGLOBIN A1C Lab Results  Component Value Date   HGBA1C 6.2 (H) 12/05/2018   MPG 131 12/05/2018   TSH No results for input(s): TSH in the last 8760 hours.  PRN Meds:. There are no discontinued medications. Current Meds  Medication Sig  . amLODipine (NORVASC) 5 MG tablet TAKE 1 TABLET EVERY DAY  . aspirin 81 MG tablet Take 81 mg by mouth daily.    Marland Kitchen CALTRATE 600+D 600-800 MG-UNIT TABS TAKE 1 TABLET TWICE DAILY  . cilostazol  (PLETAL) 100 MG tablet TAKE 1 TABLET TWICE DAILY  . ezetimibe (ZETIA) 10 MG tablet Take 10 mg by mouth daily.  . furosemide (LASIX) 20 MG tablet TAKE ONE TABLET BY MOUTH EVERY MORNING AND TAKE ONE TABLET BY MOUTH EVERY EVENING  . latanoprost (XALATAN) 0.005 % ophthalmic solution Place 1 drop into both eyes at bedtime.   Marland Kitchen losartan (COZAAR) 100 MG tablet TAKE 1 TABLET EVERY DAY  . pravastatin (PRAVACHOL) 80 MG tablet TAKE 1 TABLET EVERY DAY TO CONTROL CHOLESTEROL  . timolol (BETIMOL) 0.5 % ophthalmic solution Place 1 drop into both eyes every morning.     Cardiac Studies:   Coronary Angiogram [07/28/2016]: Moderate diffuse SFA disease. Right below knee severe diffuse disease with PT run-off. Left PT and Peroneal run-off, diffuse disease.  Lower Extremity Doppler [06/16/2016]:  No hemodynamically significant stenoses are identified in the right lower extremity arterial system. There is severe diffuse mixed plaque throughout. Moderate velocity increase at the left mid superficial femoral artery suggesting >50% stenosis. There is severe diffuse mixed plaque througout. This exam reveals severely decreased perfusion of the right lower extremity with RABI 0.46 and moderately decreased perfusion of the left lower extremity with LABI 0.69, noted at the post tibial artery level.  Peripheral arteriogram 07/28/2016: Moderate diffuse SFA disease. Right below knee severe diffuse disease with PT run-off. Left PT and Peroneal run-off, diffuse disease  Carotid Doppler  08/12/2017: Mild stenosis in the right internal carotid artery (1-15%). Minimal stenosis in the right common carotid artery (<50%). Antegrade right vertebral artery flow. Bidirectional left vertebral artery flow. May suggest proximal left subclavian stenosis. Compared to the study done on 08/12/2016, right ICA stenosis of less than 50% no longer present. Further studies when clinically indicated.  Echocardiogram 01/17/2019: Low normal LV  systolic function with EF 50%. Left ventricle  cavity is normal in size. Severe concentric hypertrophy of the left ventricle. Doppler evidence of grade I (impaired) diastolic dysfunction, normal LAP. No regional wall motion abnormalities noted.   Trileaflet aortic valve with moderately calcified aortic valve annulus. Severe right cusp calcification with restricted motion. Moderate aortic valve stenosis. Aortic valve mean gradient of 26 mmHg, Vmax of 3.6 m/s. Calculated aortic valve area by continuity equation is 1.2 cm. Moderate (Grade III) aortic regurgitation.  Mild tricuspid regurgitation. Estimated pulmonary artery systolic pressure 24 mmHg. Compared to previous study on 02/17/2017, there is interval progression of aortic stenosis.  Assessment     ICD-10-CM   1. Moderate aortic regurgitation  I35.1 PCV ECHOCARDIOGRAM COMPLETE  2. Moderate aortic stenosis  I35.0 PCV ECHOCARDIOGRAM COMPLETE  3. PAOD (peripheral arterial occlusive disease) (HCC)  I77.9   4. Essential hypertension  I10   5. Stage 3a chronic kidney disease  N18.31     EKG 11/08/2017: Normal sinus rhythm at rate of 59 bpm, left atrial enlargement, left axis deviation, left anterior fascicular block. Right bundle branch block. LVH with repolarization abnormality, cannot exclude inferior and lateral ischemia. PAC.  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  He has moderate aortic stenosis and is slight progression compared to 2018, However he remains asymptomatic continue observation for now, I stenotic gradients may also have increased due to aortic regurgitation as well.  No change in his physical exam from previous a year ago.  Blood  pressure is well controlled, lipids are also well controlled.   No clinical evidence of critical limb ischemia, he has good capillary refill, no skin breakdown.  Continue medical therapy.  I'll see him back in 6 months. I will repeat echocardiogram prior to his next office visit in 6 months.  Adrian Prows, MD, [facility] 07/24/2019, 9:24 AM [facility]. [place] Pager: [phone] Office: [phone] If no answer Cell [phone]

## 2019-08-24 ENCOUNTER — Ambulatory Visit (INDEPENDENT_AMBULATORY_CARE_PROVIDER_SITE_OTHER): Payer: Medicare HMO | Admitting: Internal Medicine

## 2019-08-24 ENCOUNTER — Encounter: Payer: Self-pay | Admitting: Internal Medicine

## 2019-08-24 ENCOUNTER — Other Ambulatory Visit: Payer: Self-pay

## 2019-08-24 ENCOUNTER — Other Ambulatory Visit: Payer: Self-pay | Admitting: Internal Medicine

## 2019-08-24 VITALS — BP 138/68 | HR 81 | Temp 97.5°F | Ht 69.0 in | Wt 166.6 lb

## 2019-08-24 DIAGNOSIS — M109 Gout, unspecified: Secondary | ICD-10-CM

## 2019-08-24 MED ORDER — COLCHICINE 0.6 MG PO TABS: ORAL_TABLET | ORAL | 0 refills | Status: DC

## 2019-08-24 NOTE — Patient Instructions (Signed)
 Gout  Gout is painful swelling of your joints. Gout is a type of arthritis. It is caused by having too much uric acid in your body. Uric acid is a chemical that is made when your body breaks down substances called purines. If your body has too much uric acid, sharp crystals can form and build up in your joints. This causes pain and swelling. Gout attacks can happen quickly and be very painful (acute gout). Over time, the attacks can affect more joints and happen more often (chronic gout). What are the causes?  Too much uric acid in your blood. This can happen because: ? Your kidneys do not remove enough uric acid from your blood. ? Your body makes too much uric acid. ? You eat too many foods that are high in purines. These foods include organ meats, some seafood, and beer.  Trauma or stress. What increases the risk?  Having a family history of gout.  Being male and middle-aged.  Being male and having gone through menopause.  Being very overweight (obese).  Drinking alcohol, especially beer.  Not having enough water in the body (being dehydrated).  Losing weight too quickly.  Having an organ transplant.  Having lead poisoning.  Taking certain medicines.  Having kidney disease.  Having a skin condition called psoriasis. What are the signs or symptoms? An attack of acute gout usually happens in just one joint. The most common place is the big toe. Attacks often start at night. Other joints that may be affected include joints of the feet, ankle, knee, fingers, wrist, or elbow. Symptoms of an attack may include:  Very bad pain.  Warmth.  Swelling.  Stiffness.  Shiny, red, or purple skin.  Tenderness. The affected joint may be very painful to touch.  Chills and fever. Chronic gout may cause symptoms more often. More joints may be involved. You may also have white or yellow lumps (tophi) on your hands or feet or in other areas near your joints. How is this  treated?  Treatment for this condition has two phases: treating an acute attack and preventing future attacks.  Acute gout treatment may include: ? NSAIDs. ? Steroids. These are taken by mouth or injected into a joint. ? Colchicine. This medicine relieves pain and swelling. It can be given by mouth or through an IV tube.  Preventive treatment may include: ? Taking small doses of NSAIDs or colchicine daily. ? Using a medicine that reduces uric acid levels in your blood. ? Making changes to your diet. You may need to see a food expert (dietitian) about what to eat and drink to prevent gout. Follow these instructions at home: During a gout attack   If told, put ice on the painful area: ? Put ice in a plastic bag. ? Place a towel between your skin and the bag. ? Leave the ice on for 20 minutes, 2-3 times a day.  Raise (elevate) the painful joint above the level of your heart as often as you can.  Rest the joint as much as possible. If the joint is in your leg, you may be given crutches.  Follow instructions from your doctor about what you cannot eat or drink. Avoiding future gout attacks  Eat a low-purine diet. Avoid foods and drinks such as: ? Liver. ? Kidney. ? Anchovies. ? Asparagus. ? Herring. ? Mushrooms. ? Mussels. ? Beer.  Stay at a healthy weight. If you want to lose weight, talk with your doctor. Do not lose   weight too fast.  Start or continue an exercise plan as told by your doctor. Eating and drinking  Drink enough fluids to keep your pee (urine) pale yellow.  If you drink alcohol: ? Limit how much you use to:  0-1 drink a day for women.  0-2 drinks a day for men. ? Be aware of how much alcohol is in your drink. In the [place] one drink equals one 12 oz bottle of beer (355 mL), one 5 oz glass of wine (148 mL), or one 1 oz glass of hard liquor (44 mL). General instructions  Take over-the-counter and prescription medicines only as told by your doctor.  Do  not drive or use heavy machinery while taking prescription pain medicine.  Return to your normal activities as told by your doctor. Ask your doctor what activities are safe for you.  Keep all follow-up visits as told by your doctor. This is important. Contact a doctor if:  You have another gout attack.  You still have symptoms of a gout attack after 10 days of treatment.  You have problems (side effects) because of your medicines.  You have chills or a fever.  You have burning pain when you pee (urinate).  You have pain in your lower back or belly. Get help right away if:  You have very bad pain.  Your pain cannot be controlled.  You cannot pee. Summary  Gout is painful swelling of the joints.  The most common site of pain is the big toe, but it can affect other joints.  Medicines and avoiding some foods can help to prevent and treat gout attacks. This information is not intended to replace advice given to you by your health care provider. Make sure you discuss any questions you have with your health care provider. Document Revised: 02/09/2018 Document Reviewed: 02/09/2018 Elsevier Patient Education  2020 Elsevier Inc.  

## 2019-08-24 NOTE — Progress Notes (Signed)
Location:  [facility]   Place of Service:   [facility]  Provider: Dr. Hollace Kinnier   Goals of Care:  Advanced Directives 08/24/2019  Does Patient Have a Medical Advance Directive? No  Would patient like information on creating a medical advance directive? No - Patient declined     Chief Complaint  Patient presents with  . Acute Visit    left foot swelling     HPI: Patient is a 79 y.o. male seen today for an acute visit for left foot swelling and pain.   Sunday morning he woke up and noticed it hurt to put weight on his left foot. He also noticed his left foot was swollen and red. Rates pain 7/10 on Sunday.  He tried soaking his foot in epsome salt. He also tried using a heating pad. Has not tried any medications. He denies having any fevers. He denies any bites, abrasions or injury to foot. Yesterday the pain decreased to a 3/10 and he could put more weight on his foot. Everyday since onset, he has noticed his foot is more bothersome in the AM and resolves in the PM.   States his diet is rich in baked chicken and vegetables. Denies eating organ meats, deli meat and alcohol consumption.   Past Medical History:  Diagnosis Date  . Anemia   . Cataract    traumatic  . Chronic diastolic heart failure (HCC)   . Chronic kidney disease, stage III (moderate)   . Depressive disorder, not elsewhere classified   . Depressive disorder, not elsewhere classified   . Elevated prostate specific antigen (PSA)   . Elevated prostate specific antigen (PSA)   . Gingivitis   . 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 Hyperlipidemia   . Hypertension   . Hypertensive kidney disease, benign   . 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 Microscopic hematuria   . Nondependent cannabis abuse   . Pure hypercholesterolemia   . Unspecified late effects of cerebrovascular disease   .  Unspecified late effects of cerebrovascular disease   . Unspecified vitamin D deficiency   . Unspecified vitamin D deficiency     Past Surgical History:  Procedure Laterality Date  . CATARACT EXTRACTION    . CORONARY STENT PLACEMENT     20 08  . EYE SURGERY     left eye,    cornea repair  . PERIPHERAL VASCULAR CATHETERIZATION N/A 07/28/2016   Procedure: Lower Extremity Angiography;  Surgeon: Adrian Prows, MD;  Location: [facility];  Service: Cardiovascular;  Laterality: N/A;    Allergies  Allergen Reactions  . Penicillins Other (See Comments)    Unknown  Has patient had a PCN reaction causing immediate rash, facial/tongue/throat swelling, SOB or lightheadedness with hypotension: {unknown Has patient had a PCN reaction causing severe rash involving mucus membranes or skin necrosis: unknown Has patient had a PCN reaction that required hospitalization {unknown Has patient had a PCN reaction occurring within the last 10 years: no If all of the above answers are "NO", then may proceed with Cephalosporin use.    Outpatient Encounter Medications as of 08/24/2019  Medication Sig  . amLODipine (NORVASC) 5 MG tablet TAKE 1 TABLET EVERY DAY  . aspirin 81 MG tablet Take 81 mg by mouth daily.    Marland Kitchen CALTRATE 600+D 600-800 MG-UNIT TABS TAKE 1 TABLET TWICE DAILY  . cilostazol (PLETAL) 100 MG  tablet TAKE 1 TABLET TWICE DAILY  . ezetimibe (ZETIA) 10 MG tablet Take 10 mg by mouth daily.  . furosemide (LASIX) 20 MG tablet TAKE ONE TABLET BY MOUTH EVERY MORNING AND TAKE ONE TABLET BY MOUTH EVERY EVENING  . latanoprost (XALATAN) 0.005 % ophthalmic solution Place 1 drop into both eyes at bedtime.   Marland Kitchen losartan (COZAAR) 100 MG tablet TAKE 1 TABLET EVERY DAY  . pravastatin (PRAVACHOL) 80 MG tablet TAKE 1 TABLET EVERY DAY TO CONTROL CHOLESTEROL  . timolol (BETIMOL) 0.5 % ophthalmic solution Place 1 drop into both eyes every morning.    No facility-administered encounter medications on file as of  08/24/2019.    Review of Systems:  Review of Systems  Health Maintenance  Topic Date Due  . TETANUS/TDAP  04/24/2029  . INFLUENZA VACCINE  Completed  . PNA vac Low Risk Adult  Completed    Physical Exam: Vitals:   08/24/19 1131  BP: 138/68  Pulse: 81  Temp: (!) 97.5 F (36.4 C)  TempSrc: Temporal  SpO2: 98%  Weight: 166 lb 9.6 oz (75.6 kg)  Height: 5\' 9"  (1.753 m)   Body mass index is 24.6 kg/m. Physical Exam  Labs reviewed: Basic Metabolic Panel: Recent Labs    12/05/18 0834  NA 140  K 4.5  CL 106  CO2 29  GLUCOSE 105*  BUN 39*  CREATININE 1.50*  CALCIUM 9.9   Liver Function Tests: Recent Labs    12/05/18 0834  AST 16  ALT 9  BILITOT 0.3  PROT 7.7   No results for input(s): LIPASE, AMYLASE in the last 8760 hours. No results for input(s): AMMONIA in the last 8760 hours. CBC: Recent Labs    12/05/18 0834 06/15/19 1520  WBC 5.5 5.1  NEUTROABS 3,124 2,535  HGB 11.4* 12.3*  HCT 34.4* 38.8  MCV 90.8 88.6  PLT 271 261   Lipid Panel: Recent Labs    12/05/18 0834  CHOL 147  HDL 55  LDLCALC 79  TRIG 54  CHOLHDL 2.7   Lab Results  Component Value Date   HGBA1C 6.2 (H) 12/05/2018    Procedures since last visit: No results found.  Assessment/Plan 1. Gouty arthritis of left great toe - suspect his long history of chronic kidney disease is underlying cause - his foot is slightly warm and swollen over left great toe, pain mainly over left great toe, no signs of fever or break in skin to introduce infection - complete blood count with differential/platelets- today - Uric acid- today - start Colchicine 0.6 mg, take 2 tablets today and then 1 tablet daily until pain has resolved.  - please contact PCP if symptoms do not subside, worsen or become feverish   Labs/tests ordered:  Complete blood count with differential/platelets, uric acid level- today Next appt:  11/15/2019

## 2019-08-24 NOTE — Telephone Encounter (Signed)
Ok to change to alternative requested by the pharmacy?

## 2019-08-25 ENCOUNTER — Telehealth: Payer: Self-pay | Admitting: *Deleted

## 2019-08-25 LAB — CBC WITH DIFFERENTIAL/PLATELET
Absolute Monocytes: 570 cells/uL (ref 200–950)
Basophils Absolute: 29 cells/uL (ref 0–200)
Basophils Relative: 0.5 %
Eosinophils Absolute: 844 cells/uL — ABNORMAL HIGH (ref 15–500)
Eosinophils Relative: 14.8 %
HCT: 36.2 % — ABNORMAL LOW (ref 38.5–50.0)
Hemoglobin: 11.8 g/dL — ABNORMAL LOW (ref 13.2–17.1)
Lymphs Abs: 832 cells/uL — ABNORMAL LOW (ref 850–3900)
MCH: 29 pg (ref 27.0–33.0)
MCHC: 32.6 g/dL (ref 32.0–36.0)
MCV: 88.9 fL (ref 80.0–100.0)
MPV: 9.3 fL (ref 7.5–12.5)
Monocytes Relative: 10 %
Neutro Abs: 3426 cells/uL (ref 1500–7800)
Neutrophils Relative %: 60.1 %
Platelets: 266 10*3/uL (ref 140–400)
RBC: 4.07 10*6/uL — ABNORMAL LOW (ref 4.20–5.80)
RDW: 13.2 % (ref 11.0–15.0)
Total Lymphocyte: 14.6 %
WBC: 5.7 10*3/uL (ref 3.8–10.8)

## 2019-08-25 LAB — URIC ACID: Uric Acid, Serum: 8.5 mg/dL — ABNORMAL HIGH (ref 4.0–8.0)

## 2019-08-25 NOTE — Progress Notes (Signed)
Please notify [name] No white blood cell count to suggest infection.   Does have elevated uric acid at 8.5 meaning that the big toe redness, inflammation and pain is from gout If he has gout to recur after we treat it this time, we may need to put him on a preventative med for it like allopurinol or uloric.

## 2019-08-25 NOTE — Telephone Encounter (Signed)
It can cause some increased myalgias when taken with his cholesterol medication.  It is a short-term med for him so I opted to give it to him.  He cannot take nsaids which are the other option or steroids which also have a lot of side effects.  Of note, he already didn't want to take it yesterday b/c he thought he was getting better, but his labs have confirmed that gout is the cause of his toe pain so what I prescribed as his doctor is best short-term treatment he can take.

## 2019-08-25 NOTE — Telephone Encounter (Signed)
Patient called and stated that he had concerns with the new medication Colchicine. Stated that the paperwork he received with the medication stated there were serious side effects taking the medication along with Cholesterol lowering medication. Stated is said could cause serious side effects along with death.   They just wanted to make sure it was ok to take with the Cholesterol medication he is already taking. Please Advise.

## 2019-08-25 NOTE — Telephone Encounter (Signed)
Patient and daughter notified and agreed. Will call if any side effects.

## 2019-08-30 ENCOUNTER — Other Ambulatory Visit: Payer: Self-pay | Admitting: Internal Medicine

## 2019-09-01 NOTE — Telephone Encounter (Signed)
Spoke with patient and advised results   

## 2019-09-01 NOTE — Telephone Encounter (Signed)
Yes, he may stop the colchicine.

## 2019-09-01 NOTE — Telephone Encounter (Signed)
Pt called and stated that his foot is so much better, he wanted to know if it is ok to stop the medication?

## 2019-09-18 DIAGNOSIS — R972 Elevated prostate specific antigen [PSA]: Secondary | ICD-10-CM | POA: Diagnosis not present

## 2019-09-25 DIAGNOSIS — N400 Enlarged prostate without lower urinary tract symptoms: Secondary | ICD-10-CM | POA: Diagnosis not present

## 2019-09-25 DIAGNOSIS — R972 Elevated prostate specific antigen [PSA]: Secondary | ICD-10-CM | POA: Diagnosis not present

## 2019-09-26 ENCOUNTER — Other Ambulatory Visit: Payer: Self-pay | Admitting: Urology

## 2019-09-26 DIAGNOSIS — R972 Elevated prostate specific antigen [PSA]: Secondary | ICD-10-CM

## 2019-10-04 ENCOUNTER — Encounter (INDEPENDENT_AMBULATORY_CARE_PROVIDER_SITE_OTHER): Payer: Medicare HMO | Admitting: Ophthalmology

## 2019-10-04 DIAGNOSIS — H43813 Vitreous degeneration, bilateral: Secondary | ICD-10-CM | POA: Diagnosis not present

## 2019-10-04 DIAGNOSIS — H35342 Macular cyst, hole, or pseudohole, left eye: Secondary | ICD-10-CM | POA: Diagnosis not present

## 2019-10-04 DIAGNOSIS — H35033 Hypertensive retinopathy, bilateral: Secondary | ICD-10-CM

## 2019-10-04 DIAGNOSIS — I10 Essential (primary) hypertension: Secondary | ICD-10-CM

## 2019-10-04 DIAGNOSIS — H2511 Age-related nuclear cataract, right eye: Secondary | ICD-10-CM

## 2019-10-10 DIAGNOSIS — H401112 Primary open-angle glaucoma, right eye, moderate stage: Secondary | ICD-10-CM | POA: Diagnosis not present

## 2019-10-10 DIAGNOSIS — Z961 Presence of intraocular lens: Secondary | ICD-10-CM | POA: Diagnosis not present

## 2019-10-10 DIAGNOSIS — H2511 Age-related nuclear cataract, right eye: Secondary | ICD-10-CM | POA: Diagnosis not present

## 2019-10-10 DIAGNOSIS — H401123 Primary open-angle glaucoma, left eye, severe stage: Secondary | ICD-10-CM | POA: Diagnosis not present

## 2019-10-24 ENCOUNTER — Ambulatory Visit
Admission: RE | Admit: 2019-10-24 | Discharge: 2019-10-24 | Disposition: A | Discharge status: Home / Self Care | Payer: Medicare HMO | Source: Ambulatory Visit | Attending: Urology | Admitting: Urology

## 2019-10-24 DIAGNOSIS — N400 Enlarged prostate without lower urinary tract symptoms: Secondary | ICD-10-CM | POA: Diagnosis not present

## 2019-10-24 DIAGNOSIS — R972 Elevated prostate specific antigen [PSA]: Secondary | ICD-10-CM | POA: Diagnosis not present

## 2019-10-24 MED ORDER — GADOBUTROL 1 MMOL/ML IV SOLN
8.0000 mL | Freq: Once | INTRAVENOUS | Status: AC | PRN
  Administered 2019-10-24: 8 mL via INTRAVENOUS

## 2019-11-06 ENCOUNTER — Other Ambulatory Visit: Payer: Self-pay

## 2019-11-06 MED ORDER — EZETIMIBE 10 MG PO TABS: 10.0000 mg | ORAL_TABLET | Freq: Every day | ORAL | 1 refills | Status: DC

## 2019-11-06 NOTE — Telephone Encounter (Signed)
Patient daughter called and requested refill on medication "Zetia".

## 2019-11-07 DIAGNOSIS — R972 Elevated prostate specific antigen [PSA]: Secondary | ICD-10-CM | POA: Diagnosis not present

## 2019-11-07 DIAGNOSIS — N400 Enlarged prostate without lower urinary tract symptoms: Secondary | ICD-10-CM | POA: Diagnosis not present

## 2019-11-15 ENCOUNTER — Other Ambulatory Visit: Payer: Medicare HMO

## 2019-11-15 ENCOUNTER — Other Ambulatory Visit: Payer: Self-pay

## 2019-11-15 DIAGNOSIS — I10 Essential (primary) hypertension: Secondary | ICD-10-CM

## 2019-11-15 DIAGNOSIS — E7800 Pure hypercholesterolemia, unspecified: Secondary | ICD-10-CM | POA: Diagnosis not present

## 2019-11-15 DIAGNOSIS — N1831 Chronic kidney disease, stage 3a: Secondary | ICD-10-CM

## 2019-11-15 DIAGNOSIS — R739 Hyperglycemia, unspecified: Secondary | ICD-10-CM

## 2019-11-16 LAB — BASIC METABOLIC PANEL
BUN/Creatinine Ratio: 22 (calc) (ref 6–22)
BUN: 43 mg/dL — ABNORMAL HIGH (ref 7–25)
CO2: 27 mmol/L (ref 20–32)
Calcium: 10 mg/dL (ref 8.6–10.3)
Chloride: 106 mmol/L (ref 98–110)
Creat: 1.92 mg/dL — ABNORMAL HIGH (ref 0.70–1.18)
Glucose, Bld: 103 mg/dL — ABNORMAL HIGH (ref 65–99)
Potassium: 5 mmol/L (ref 3.5–5.3)
Sodium: 140 mmol/L (ref 135–146)

## 2019-11-16 LAB — LIPID PANEL
Cholesterol: 153 mg/dL (ref <200)
HDL: 45 mg/dL (ref >=40)
LDL Cholesterol (Calc): 91 mg/dL (calc)
Non-HDL Cholesterol (Calc): 108 mg/dL (calc) (ref <130)
Total CHOL/HDL Ratio: 3.4 (calc) (ref <5.0)
Triglycerides: 81 mg/dL (ref <150)

## 2019-11-17 NOTE — Progress Notes (Signed)
Please notify Carma Dwiggins that Mack's cholesterol has trended up recently with goal of less than 70.  It's crucial he's been taking his zetia and pravastatin faithfully.   His kidney function also is worse--is he taking ibuprofen, aleve, etc?  Is he drinking enough water?

## 2019-11-20 ENCOUNTER — Other Ambulatory Visit: Payer: Self-pay

## 2019-11-20 ENCOUNTER — Encounter: Payer: Self-pay | Admitting: Internal Medicine

## 2019-11-20 ENCOUNTER — Ambulatory Visit (INDEPENDENT_AMBULATORY_CARE_PROVIDER_SITE_OTHER): Payer: Medicare HMO | Admitting: Internal Medicine

## 2019-11-20 VITALS — BP 138/62 | HR 83 | Temp 97.8°F | Ht 71.0 in | Wt 163.6 lb

## 2019-11-20 DIAGNOSIS — N1831 Chronic kidney disease, stage 3a: Secondary | ICD-10-CM | POA: Diagnosis not present

## 2019-11-20 DIAGNOSIS — I10 Essential (primary) hypertension: Secondary | ICD-10-CM

## 2019-11-20 DIAGNOSIS — G3184 Mild cognitive impairment, so stated: Secondary | ICD-10-CM

## 2019-11-20 DIAGNOSIS — I779 Disorder of arteries and arterioles, unspecified: Secondary | ICD-10-CM | POA: Diagnosis not present

## 2019-11-20 DIAGNOSIS — R233 Spontaneous ecchymoses: Secondary | ICD-10-CM

## 2019-11-20 DIAGNOSIS — R739 Hyperglycemia, unspecified: Secondary | ICD-10-CM | POA: Diagnosis not present

## 2019-11-20 DIAGNOSIS — R238 Other skin changes: Secondary | ICD-10-CM | POA: Diagnosis not present

## 2019-11-20 NOTE — Patient Instructions (Signed)
Decrease your potato chips and increase your water to 3 bottles per day.

## 2019-11-20 NOTE — Progress Notes (Signed)
Location:  [facility] clinic Provider:  Talaysia Pinheiro L. Mariea Clonts, D.O., C.M.D.  Goals of Care:  Advanced Directives 11/20/2019  Does Patient Have a Medical Advance Directive? No  Would patient like information on creating a medical advance directive? No - Patient declined     Chief Complaint  Patient presents with  . Medical Management of Chronic Issues    4 month follow up     HPI: Patient is a 79 y.o. male seen today for medical management of chronic diseases.  He's here with his god daughter, Jonelle Sidle.    He again asks about the items indicated on his dmv paperwork--note he is currently back to driving within 15 miles of his home due to OT clearance. He has PAD, peripheral neuropathy, cognitive impairment and cardiovascular disease.  He has new bruising--it just appeared on his right upper arm and some on his left side a few inches above the belt line on his abdomen.  (see photos in media).  Previously, he had left sided hematomas in that same area, but slightly lower down.  Both times, he is denying bumping into anything, falling or any other trauma to cause the bruises.  He is on asa and pletal due to his PAD.  Last time, the bruised areas were much more prominent and I was concerned due to their size and lack of known injury--US showed hematomas vs necrotic fat tissue.    He does not drink alcohol at this time.    He is eating potato chips that may have trended up the cholesterol.  He is taking the pravastatin and zetia.   He drinks two bottles of water per day.  Drinks ginger ale and minute maid fruit punch.  He does not take any nsaids.  Kashlynn Kundert, his goddaughter, has a good system in place.  [facility]ed [facility] and he has BPH NOT prostate cancer though last MRI was suspicious.  Requested and received last [facility] note which does support that he has BPH and needs continued monitoring for his quite high PSA and suspicious area in prostate--he'd refused any treatment and chose  monitoring.  Past Medical History:  Diagnosis Date  . Anemia   . Cataract    traumatic  . Chronic diastolic heart failure ([place])   . Chronic kidney disease, stage III (moderate)   . Depressive disorder, not elsewhere classified   . Depressive disorder, not elsewhere classified   . Elevated prostate specific antigen (PSA)   . Elevated prostate specific antigen (PSA)   . Gingivitis   . 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 Hyperlipidemia   . Hypertension   . Hypertensive kidney disease, benign   . 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 Microscopic hematuria   . Nondependent cannabis abuse   . Pure hypercholesterolemia   . Unspecified late effects of cerebrovascular disease   . Unspecified late effects of cerebrovascular disease   . Unspecified vitamin D deficiency   . Unspecified vitamin D deficiency     Past Surgical History:  Procedure Laterality Date  . CATARACT EXTRACTION    . CORONARY STENT PLACEMENT     2008  . EYE SURGERY     left eye,    cornea repair  . PERIPHERAL VASCULAR CATHETERIZATION N/A 07/28/2016   Procedure: Lower Extremity Angiography;  Surgeon: Adrian Prows, MD;  Location: [facility];  Service: Cardiovascular;  Laterality: N/A;  Allergies  Allergen Reactions  . Penicillins Other (See Comments)    Unknown  Has patient had a PCN reaction causing immediate rash, facial/tongue/throat swelling, SOB or lightheadedness with hypotension: {unknown Has patient had a PCN reaction causing severe rash involving mucus membranes or skin necrosis: unknown Has patient had a PCN reaction that required hospitalization {unknown Has patient had a PCN reaction occurring within the last 10 years: no If all of the above answers are "NO", then may proceed with Cephalosporin use.    Outpatient Encounter Medications as of 11/20/2019  Medication Sig  .  amLODipine (NORVASC) 5 MG tablet TAKE 1 TABLET EVERY DAY  . aspirin 81 MG tablet Take 81 mg by mouth daily.    Marland Kitchen CALTRATE 600+D 600-800 MG-UNIT TABS TAKE 1 TABLET TWICE DAILY  . cilostazol (PLETAL) 100 MG tablet TAKE 1 TABLET TWICE DAILY  . ezetimibe (ZETIA) 10 MG tablet Take 1 tablet (10 mg total) by mouth daily.  . furosemide (LASIX) 20 MG tablet TAKE ONE TABLET BY MOUTH EVERY MORNING AND TAKE ONE TABLET BY MOUTH EVERY EVENING  . latanoprost (XALATAN) 0.005 % ophthalmic solution Place 1 drop into both eyes at bedtime.   Marland Kitchen losartan (COZAAR) 100 MG tablet TAKE 1 TABLET EVERY DAY  . pravastatin (PRAVACHOL) 80 MG tablet TAKE 1 TABLET EVERY DAY TO CONTROL CHOLESTEROL  . timolol (BETIMOL) 0.5 % ophthalmic solution Place 1 drop into both eyes every morning.   . [DISCONTINUED] Colchicine (MITIGARE) 0.6 MG CAPS 2 today, then one daily until foot pain resolves   No facility-administered encounter medications on file as of 11/20/2019.    Review of Systems:  Review of Systems  Constitutional: Negative for chills, fever and malaise/fatigue.  HENT: Positive for hearing loss.   Eyes: Negative for blurred vision.  Respiratory: Negative for shortness of breath.   Cardiovascular: Negative for chest pain, palpitations and leg swelling.  Gastrointestinal: Negative for abdominal pain.  Genitourinary: Negative for dysuria.  Musculoskeletal: Negative for falls and joint pain.  Skin: Negative for itching and rash.       New bruises without injury  Neurological: Positive for tingling and sensory change. Negative for weakness.  Endo/Heme/Allergies: Bruises/bleeds easily.  Psychiatric/Behavioral: Positive for memory loss. Negative for depression. The patient is not nervous/anxious and does not have insomnia.     Health Maintenance  Topic Date Due  . COVID-19 Vaccine (1) Never done  . INFLUENZA VACCINE  03/03/2020  . TETANUS/TDAP  04/24/2029  . PNA vac Low Risk Adult  Completed    Physical  Exam: Vitals:   11/20/19 1005  BP: 138/62  Pulse: 83  Temp: 97.8 F (36.6 C)  TempSrc: Temporal  SpO2: 98%  Weight: 163 lb 9.6 oz (74.2 kg)  Height: 5\' 11"  (1.803 m)   Body mass index is 22.82 kg/m. Physical Exam Vitals reviewed.  Constitutional:      Appearance: Normal appearance.  Cardiovascular:     Rate and Rhythm: Normal rate and regular rhythm.     Heart sounds: Murmur present.  Pulmonary:     Effort: Pulmonary effort is normal.     Breath sounds: Normal breath sounds. No wheezing, rhonchi or rales.  Abdominal:     General: Bowel sounds are normal.  Musculoskeletal:        General: Normal range of motion.     Right lower leg: No edema.     Left lower leg: No edema.  Skin:    Comments: Ecchymoses of right upper arm and left side  above waist  Neurological:     General: No focal deficit present.     Mental Status: He is alert and oriented to person, place, and time.     Sensory: Sensory deficit present.     Gait: Gait abnormal.  Psychiatric:        Mood and Affect: Mood normal.     Labs reviewed: Basic Metabolic Panel: Recent Labs    12/05/18 0834 11/15/19 0834  NA 140 140  K 4.5 5.0  CL 106 106  CO2 29 27  GLUCOSE 105* 103*  BUN 39* 43*  CREATININE 1.50* 1.92*  CALCIUM 9.9 10.0   Liver Function Tests: Recent Labs    12/05/18 0834  AST 16  ALT 9  BILITOT 0.3  PROT 7.7   No results for input(s): LIPASE, AMYLASE in the last 8760 hours. No results for input(s): AMMONIA in the last 8760 hours. CBC: Recent Labs    12/05/18 0834 06/15/19 1520 08/24/19 1215  WBC 5.5 5.1 5.7  NEUTROABS 3,124 2,535 3,426  HGB 11.4* 12.3* 11.8*  HCT 34.4* 38.8 36.2*  MCV 90.8 88.6 88.9  PLT 271 261 266   Lipid Panel: Recent Labs    12/05/18 0834 11/15/19 0834  CHOL 147 153  HDL 55 45  LDLCALC 79 91  TRIG 54 81  CHOLHDL 2.7 3.4   Lab Results  Component Value Date   HGBA1C 6.2 (H) 12/05/2018    Procedures since last visit: MR PROSTATE W WO  CONTRAST  Result Date: 10/24/2019 CLINICAL DATA:  Elevated PSA (19.3).  Negative biopsy in 2016. EXAM: MR PROSTATE WITHOUT AND WITH CONTRAST TECHNIQUE: Multiplanar multisequence MRI images were obtained of the pelvis centered about the prostate. Pre and post contrast images were obtained. CONTRAST:  16mL GADAVIST GADOBUTROL 1 MMOL/ML IV SOLN COMPARISON:  MR prostate dated 03/18/2015 FINDINGS: Prostate: Marked enlargement of the central gland, suggesting BPH. Exophytic central gland nodule along the left lateral mid gland/base of the peripheral zone (series 9/image 12). No suspicious central gland nodule on T2. Mild vague low T2 lesion in the medial left posterior mid gland (series 9/image 15), measuring approximately 16 x 7 x 13 mm. Mild heterogeneous low ADC (series 7/image 16) without true restricted diffusion, although this is constrained by artifact related to rectal gas. No convincing early arterial enhancement. By definition, this reflects a PI-RADS 3 lesion. Volume: 6.2 x 5.5 x 6.1 cm (calculated volume 96.7 mL) Transcapsular spread: Absent. Seminal vesicle involvement: Absent. Neurovascular bundle involvement: Absent. Pelvic adenopathy: Absent. Small right external iliac nodes measuring up to 4 mm short axis, small right external iliac nodes measuring up to 5 mm short axis. Bone metastasis: Absent. Other findings: None. IMPRESSION: 16 mm vague low T2 lesion in the left posterior mid gland, possibly reflecting macroscopic prostate cancer, although equivocal. PI-RADS 3. No findings suspicious for extracapsular extension, seminal vesicle invasion, lymphadenopathy, or metastatic disease. Prostatomegaly with enlargement the central gland, suggesting BPH. Calculated prostate volume 96.7 mL. Electronically Signed   By: Julian Hy M.D.   On: 10/24/2019 14:10    Assessment/Plan 1. Abnormal bruising - has happened a previous time and basic labs to detect etiology were unrevealing and [place] of site on abdomen  before showed hematoma vs fat necrosis - since this is recurring and other pts on the asa and pletal mix have not had this extensive odd bruising, I will refer to hematology for evaluation at this point - Ambulatory referral to Hematology / Oncology  2. Mild cognitive impairment  with memory loss -ongoing, each visit we review why his driving is limited  3. Essential hypertension -bp at goal with current therapy, cont same regimen  4. Stage 3a chronic kidney disease -Avoid nephrotoxic agents like nsaids, dose adjust renally excreted meds, hydrate. -has worsened so counseled on hydration with 3 16oz bottles of water per day  5. Hyperglycemia -ongoing, decrease sprite and juices and drink more water Lab Results  Component Value Date   HGBA1C 6.2 (H) 12/05/2018    6. PAOD (peripheral arterial occlusive disease) ([place]) With neuropathy -sensation in feet also poor is other reason to avoid driving   Next appt:  04/26/2020   Jendaya Gossett L. Abdulhadi Stopa, D.O. [facility] [address], [place] [address] Cell Phone (Mon-Fri 8am-5pm):  [phone] On Call:  [phone] & follow prompts after 5pm & weekends Office Phone:  [phone] Office Fax:  [phone]

## 2019-11-21 ENCOUNTER — Telehealth: Payer: Self-pay | Admitting: Hematology

## 2019-11-21 NOTE — Telephone Encounter (Signed)
Received a new hem referral from Dr. Mariea Clonts at [facility] for abnormal bruising. Pt has been cld and scheduled to see Dr. Irene Limbo 4/27 at 11am. Pt aware to arrive 15 minutes early.

## 2019-11-28 ENCOUNTER — Encounter (HOSPITAL_COMMUNITY): Payer: Self-pay | Admitting: Emergency Medicine

## 2019-11-28 ENCOUNTER — Other Ambulatory Visit: Payer: Self-pay

## 2019-11-28 ENCOUNTER — Inpatient Hospital Stay: Payer: Medicare HMO

## 2019-11-28 ENCOUNTER — Inpatient Hospital Stay: Payer: Medicare HMO | Attending: Hematology | Admitting: Hematology

## 2019-11-28 ENCOUNTER — Observation Stay (HOSPITAL_COMMUNITY)
Admission: EM | Admit: 2019-11-28 | Discharge: 2019-11-29 | Disposition: A | Discharge status: Home / Self Care | Payer: Medicare HMO | Attending: Internal Medicine | Admitting: Internal Medicine

## 2019-11-28 VITALS — BP 170/54 | HR 71 | Temp 98.3°F | Resp 18 | Ht 71.0 in | Wt 164.2 lb

## 2019-11-28 DIAGNOSIS — Z20822 Contact with and (suspected) exposure to covid-19: Secondary | ICD-10-CM | POA: Insufficient documentation

## 2019-11-28 DIAGNOSIS — I5032 Chronic diastolic (congestive) heart failure: Secondary | ICD-10-CM | POA: Diagnosis present

## 2019-11-28 DIAGNOSIS — N1832 Chronic kidney disease, stage 3b: Secondary | ICD-10-CM | POA: Insufficient documentation

## 2019-11-28 DIAGNOSIS — E7800 Pure hypercholesterolemia, unspecified: Secondary | ICD-10-CM | POA: Diagnosis not present

## 2019-11-28 DIAGNOSIS — R233 Spontaneous ecchymoses: Secondary | ICD-10-CM

## 2019-11-28 DIAGNOSIS — Z03818 Encounter for observation for suspected exposure to other biological agents ruled out: Secondary | ICD-10-CM | POA: Diagnosis not present

## 2019-11-28 DIAGNOSIS — E785 Hyperlipidemia, unspecified: Secondary | ICD-10-CM | POA: Insufficient documentation

## 2019-11-28 DIAGNOSIS — R404 Transient alteration of awareness: Secondary | ICD-10-CM | POA: Diagnosis not present

## 2019-11-28 DIAGNOSIS — I200 Unstable angina: Secondary | ICD-10-CM | POA: Diagnosis not present

## 2019-11-28 DIAGNOSIS — I959 Hypotension, unspecified: Secondary | ICD-10-CM | POA: Diagnosis not present

## 2019-11-28 DIAGNOSIS — R238 Other skin changes: Secondary | ICD-10-CM | POA: Diagnosis not present

## 2019-11-28 DIAGNOSIS — I10 Essential (primary) hypertension: Secondary | ICD-10-CM | POA: Diagnosis present

## 2019-11-28 DIAGNOSIS — Z7982 Long term (current) use of aspirin: Secondary | ICD-10-CM | POA: Diagnosis not present

## 2019-11-28 DIAGNOSIS — I350 Nonrheumatic aortic (valve) stenosis: Secondary | ICD-10-CM

## 2019-11-28 DIAGNOSIS — R4020 Unspecified coma: Secondary | ICD-10-CM | POA: Diagnosis not present

## 2019-11-28 DIAGNOSIS — N183 Chronic kidney disease, stage 3 unspecified: Secondary | ICD-10-CM | POA: Insufficient documentation

## 2019-11-28 DIAGNOSIS — I69951 Hemiplegia and hemiparesis following unspecified cerebrovascular disease affecting right dominant side: Secondary | ICD-10-CM | POA: Diagnosis not present

## 2019-11-28 DIAGNOSIS — R55 Syncope and collapse: Principal | ICD-10-CM | POA: Diagnosis present

## 2019-11-28 DIAGNOSIS — I130 Hypertensive heart and chronic kidney disease with heart failure and stage 1 through stage 4 chronic kidney disease, or unspecified chronic kidney disease: Secondary | ICD-10-CM | POA: Diagnosis not present

## 2019-11-28 DIAGNOSIS — F329 Major depressive disorder, single episode, unspecified: Secondary | ICD-10-CM | POA: Insufficient documentation

## 2019-11-28 DIAGNOSIS — R4182 Altered mental status, unspecified: Secondary | ICD-10-CM | POA: Diagnosis not present

## 2019-11-28 DIAGNOSIS — Z79899 Other long term (current) drug therapy: Secondary | ICD-10-CM | POA: Insufficient documentation

## 2019-11-28 LAB — BASIC METABOLIC PANEL
Anion gap: 11 (ref 5–15)
BUN: 45 mg/dL — ABNORMAL HIGH (ref 8–23)
CO2: 22 mmol/L (ref 22–32)
Calcium: 9.9 mg/dL (ref 8.9–10.3)
Chloride: 107 mmol/L (ref 98–111)
Creatinine, Ser: 2.07 mg/dL — ABNORMAL HIGH (ref 0.61–1.24)
GFR calc Af Amer: 35 mL/min — ABNORMAL LOW (ref >60)
GFR calc non Af Amer: 30 mL/min — ABNORMAL LOW (ref >60)
Glucose, Bld: 99 mg/dL (ref 70–99)
Potassium: 4.5 mmol/L (ref 3.5–5.1)
Sodium: 140 mmol/L (ref 135–145)

## 2019-11-28 LAB — CBC
HCT: 38.4 % — ABNORMAL LOW (ref 39.0–52.0)
Hemoglobin: 11.7 g/dL — ABNORMAL LOW (ref 13.0–17.0)
MCH: 28.3 pg (ref 26.0–34.0)
MCHC: 30.5 g/dL (ref 30.0–36.0)
MCV: 92.8 fL (ref 80.0–100.0)
Platelets: 266 10*3/uL (ref 150–400)
RBC: 4.14 MIL/uL — ABNORMAL LOW (ref 4.22–5.81)
RDW: 14.3 % (ref 11.5–15.5)
WBC: 6.6 10*3/uL (ref 4.0–10.5)
nRBC: 0 % (ref 0.0–0.2)

## 2019-11-28 NOTE — Progress Notes (Signed)
HEMATOLOGY/ONCOLOGY CONSULTATION NOTE  Date of Service: 11/28/2019  Patient Care Team: Gayland Curry, DO as PCP - General (Geriatric Medicine) Adrian Prows, MD as Consulting Physician (Cardiology) Clent Jacks, MD as Consulting Physician (Ophthalmology) Mal Misty, MD (Inactive) as Consulting Physician (Vascular Surgery) Garvin Fila, MD as Consulting Physician (Neurology) Hayden Pedro, MD as Consulting Physician (Ophthalmology)  CHIEF COMPLAINTS/PURPOSE OF CONSULTATION:  Abnormal Bruising  HISTORY OF PRESENTING ILLNESS:   [name] is a wonderful 79 y.o. male who has been referred to [place] by Dr. Mariea Clonts for evaluation and management of abnormal bruising. The pt reports that he is doing well overall.   The pt reports that he has recently resolved bruises on his right bicep and left flank. He has no knowledge of any injury to either area. He denies any nose bleeds, gum bleeds, bloody/black stools, hematuria or other bleeding issues. Pt has been taking Aspirin and Cilostazol as prescribed. Pt drinks 1-2 glasses of wine a couple of times per month and has quit smoking. He still has some right-sided weakness from previous stroke, but it is not hindering him at this time. Cramping and pain in his calves are what stop him from walking.   Most recent lab results (08/24/2019) of CBC w/diff is as follows: all values are WNL except for RBC at 4.07, Hgb at 11.8, HCT at 36.2, Lymphs Abs at 832, Eos Abs at 844.  On review of systems, pt reports right-sided weakness, bruising, cramping/pain in calves and denies bleeding concerns, unexpected weight loss, low appetite and any other symptoms.   On PMHx the pt reports Coronary Stent Placement, HTN, Left Eye Cornea Repair, HLD, Anemia, Chronic Diastolic Heart Failure, CKD Stage III, Elevated PSA, Peripheral Arterial Disease, Hemiparesis of right side as late effect of Cerebrovascular Disease.  On Social Hx the pt reports that he drinks 1-2  glasses of wine a couple of times per month and has quit smoking cigarettes.    MEDICAL HISTORY:  Past Medical History:  Diagnosis Date  . Anemia   . Cataract    traumatic  . Chronic diastolic heart failure ([place])   . Chronic kidney disease, stage III (moderate)   . Depressive disorder, not elsewhere classified   . Depressive disorder, not elsewhere classified   . Elevated prostate specific antigen (PSA)   . Elevated prostate specific antigen (PSA)   . Gingivitis   . 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 Hyperlipidemia   . Hypertension   . Hypertensive kidney disease, benign   . 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 Microscopic hematuria   . Nondependent cannabis abuse   . Pure hypercholesterolemia   . Unspecified late effects of cerebrovascular disease   . Unspecified late effects of cerebrovascular disease   . Unspecified vitamin D deficiency   . Unspecified vitamin D deficiency     SURGICAL HISTORY: Past Surgical History:  Procedure Laterality Date  . CATARACT EXTRACTION    . CORONARY STENT PLACEMENT     2008  . EYE SURGERY     left eye,    cornea repair  . PERIPHERAL VASCULAR CATHETERIZATION N/A 07/28/2016   Procedure: Lower Extremity Angiography;  Surgeon: Adrian Prows, MD;  Location: [facility];  Service: Cardiovascular;  Laterality: N/A;    SOCIAL HISTORY: Social History   Socioeconomic History  . Marital status: Widowed    Spouse  name: Not on file  . Number of children: 2  . Years of education: Not on file  . Highest education level: Not on file  Occupational History  . Not on file  Tobacco Use  . Smoking status: Former Smoker    Packs/day: 0.50    Years: 13.00    Pack years: 6.50    Quit date: 03/09/1971    Years since quitting: 48.7  . Smokeless tobacco: Never Used  Substance and Sexual Activity  . Alcohol use: No     Alcohol/week: 0.0 standard drinks  . Drug use: No  . Sexual activity: Not on file  Other Topics Concern  . Not on file  Social History Narrative  . Not on file   Social Determinants of Health   Financial Resource Strain:   . Difficulty of Paying Living Expenses:   Food Insecurity:   . Worried About Charity fundraiser in the Last Year:   . Arboriculturist in the Last Year:   Transportation Needs:   . Film/video editor (Medical):   Marland Kitchen Lack of Transportation (Non-Medical):   Physical Activity:   . Days of Exercise per Week:   . Minutes of Exercise per Session:   Stress:   . Feeling of Stress :   Social Connections:   . Frequency of Communication with Friends and Family:   . Frequency of Social Gatherings with Friends and Family:   . Attends Religious Services:   . Active Member of Clubs or Organizations:   . Attends Archivist Meetings:   Marland Kitchen Marital Status:   Intimate Partner Violence:   . Fear of Current or Ex-Partner:   . Emotionally Abused:   Marland Kitchen Physically Abused:   . Sexually Abused:     FAMILY HISTORY: Family History  Problem Relation Age of Onset  . Cancer Father     ALLERGIES:  is allergic to penicillins.  MEDICATIONS:  Current Outpatient Medications  Medication Sig Dispense Refill  . amLODipine (NORVASC) 5 MG tablet TAKE 1 TABLET EVERY DAY 90 tablet 1  . aspirin 81 MG tablet Take 81 mg by mouth daily.      Marland Kitchen CALTRATE 600+D 600-800 MG-UNIT TABS TAKE 1 TABLET TWICE DAILY 180 tablet 5  . cilostazol (PLETAL) 100 MG tablet TAKE 1 TABLET TWICE DAILY 180 tablet 2  . ezetimibe (ZETIA) 10 MG tablet Take 1 tablet (10 mg total) by mouth daily. 90 tablet 1  . furosemide (LASIX) 20 MG tablet TAKE ONE TABLET BY MOUTH EVERY MORNING AND TAKE ONE TABLET BY MOUTH EVERY EVENING 180 tablet 1  . latanoprost (XALATAN) 0.005 % ophthalmic solution Place 1 drop into both eyes at bedtime.     Marland Kitchen losartan (COZAAR) 100 MG tablet TAKE 1 TABLET EVERY DAY 90 tablet 1  .  pravastatin (PRAVACHOL) 80 MG tablet TAKE 1 TABLET EVERY DAY TO CONTROL CHOLESTEROL 90 tablet 1  . timolol (BETIMOL) 0.5 % ophthalmic solution Place 1 drop into both eyes every morning.      No current facility-administered medications for this visit.    REVIEW OF SYSTEMS:    10 Point review of Systems was done is negative except as noted above.  PHYSICAL EXAMINATION: ECOG PERFORMANCE STATUS: 1 - Symptomatic but completely ambulatory  . Vitals:   11/28/19 1121  BP: (!) 170/54  Pulse: 71  Resp: 18  Temp: 98.3 F (36.8 C)  SpO2: 100%   Filed Weights   11/28/19 1121  Weight: 164 lb 3.2  oz (74.5 kg)   .Body mass index is 22.9 kg/m.  GENERAL:alert, in no acute distress and comfortable SKIN: no acute rashes, no significant lesions EYES: conjunctiva are pink and non-injected, sclera anicteric OROPHARYNX: MMM, no exudates, no oropharyngeal erythema or ulceration NECK: supple, no JVD LYMPH:  no palpable lymphadenopathy in the cervical, axillary or inguinal regions LUNGS: clear to auscultation b/l with normal respiratory effort HEART: regular rate, ejection systolic murmur 3/6 ABDOMEN:  normoactive bowel sounds , non tender, not distended. Extremity: no pedal edema PSYCH: alert & oriented x 3 with fluent speech NEURO: no focal motor/sensory deficits  LABORATORY DATA:  I have reviewed the data as listed  . CBC Latest Ref Rng & Units 08/24/2019 06/15/2019 12/05/2018  WBC 3.8 - 10.8 Thousand/uL 5.7 5.1 5.5  Hemoglobin 13.2 - 17.1 g/dL 11.8(L) 12.3(L) 11.4(L)  Hematocrit 38.5 - 50.0 % 36.2(L) 38.8 34.4(L)  Platelets 140 - 400 Thousand/uL 266 261 271    . CMP Latest Ref Rng & Units 11/15/2019 12/05/2018 07/21/2018  Glucose 65 - 99 mg/dL 103(H) 105(H) 91  BUN 7 - 25 mg/dL 43(H) 39(H) 43(H)  Creatinine 0.70 - 1.18 mg/dL 1.92(H) 1.50(H) 1.52(H)  Sodium 135 - 146 mmol/L 140 140 139  Potassium 3.5 - 5.3 mmol/L 5.0 4.5 5.0  Chloride 98 - 110 mmol/L 106 106 109  CO2 20 - 32 mmol/L  27 29 24   Calcium 8.6 - 10.3 mg/dL 10.0 9.9 9.8  Total Protein 6.1 - 8.1 g/dL - 7.7 -  Total Bilirubin 0.2 - 1.2 mg/dL - 0.3 -  Alkaline Phos 40 - 115 U/L - - -  AST 10 - 35 U/L - 16 -  ALT 9 - 46 U/L - 9 -     RADIOGRAPHIC STUDIES: I have personally reviewed the radiological images as listed and agreed with the findings in the report. No results found.  ASSESSMENT & PLAN:   79 yo with   1) Easy bruisability Superficial mild skin bruising No other evidence of significant bleeding issues. PLAN: -Discussed patient's most recent labs from 08/24/2019, all values are WNL except for RBC at 4.07, Hgb at 11.8, HCT at 36.2, Lymphs Abs at 832, Eos Abs at 844. -Advised pt that Aspirin and Cilostazol can affect platelet function and contribute to bruising but are necessary medications  -Advised pt that CKD can contribute to bruising as well -Advised pt that Senile purpura can also cause bruising as skin and blood vessels become more fragile with age -Recommend pt keep skin well-hydrated to help prevent additional bruising -Advised pt that an especially tight aortic valve can break down some clotting factors, causing aquired Von Willebrand's Disease - not likely the cause in pt's case based on previous Cardiology evaluation.  -Offered to get labs today to r/o bleeding disorder - pt declined  -Recommended pt continue to f/u with his PCP, Cardiologist, and Nephrologist as appropriate -Will see back as needed   FOLLOW UP: RTC with Dr Irene Limbo as needed   All of the patients questions were answered with apparent satisfaction. The patient knows to call the clinic with any problems, questions or concerns.  I spent 30 mins counseling the patient face to face. The total time spent in the appointment was 45 minutes and more than 50% was on counseling and direct patient cares.    Sullivan Lone MD [place] AAHIVMS [facility] Hematology/Oncology Physician [facility]  (Office):        [phone] (Work cell):  [phone] (Fax):  [phone]  11/28/2019 4:07 PM  I, Yevette Edwards, am acting as a scribe for Dr. Sullivan Lone.   .I have reviewed the above documentation for accuracy and completeness, and I agree with the above. Brunetta Genera MD

## 2019-11-28 NOTE — ED Triage Notes (Signed)
Pt arrives via EMS after a syncopal episode witnessed by family. Pt states he was tired from staying up late and was taking a nap. Hx of CVA with right sided deficits.

## 2019-11-29 ENCOUNTER — Observation Stay (HOSPITAL_COMMUNITY): Payer: Medicare HMO

## 2019-11-29 ENCOUNTER — Ambulatory Visit: Payer: Medicare HMO

## 2019-11-29 ENCOUNTER — Emergency Department (HOSPITAL_COMMUNITY): Payer: Medicare HMO

## 2019-11-29 ENCOUNTER — Other Ambulatory Visit: Payer: Self-pay | Admitting: Cardiology

## 2019-11-29 DIAGNOSIS — I10 Essential (primary) hypertension: Secondary | ICD-10-CM | POA: Diagnosis not present

## 2019-11-29 DIAGNOSIS — I350 Nonrheumatic aortic (valve) stenosis: Secondary | ICD-10-CM

## 2019-11-29 DIAGNOSIS — R55 Syncope and collapse: Secondary | ICD-10-CM | POA: Diagnosis present

## 2019-11-29 DIAGNOSIS — N1832 Chronic kidney disease, stage 3b: Secondary | ICD-10-CM

## 2019-11-29 DIAGNOSIS — R4182 Altered mental status, unspecified: Secondary | ICD-10-CM | POA: Diagnosis not present

## 2019-11-29 DIAGNOSIS — I5032 Chronic diastolic (congestive) heart failure: Secondary | ICD-10-CM | POA: Diagnosis not present

## 2019-11-29 DIAGNOSIS — I200 Unstable angina: Secondary | ICD-10-CM | POA: Diagnosis not present

## 2019-11-29 DIAGNOSIS — R748 Abnormal levels of other serum enzymes: Secondary | ICD-10-CM

## 2019-11-29 LAB — URINALYSIS, ROUTINE W REFLEX MICROSCOPIC
Bacteria, UA: NONE SEEN
Bilirubin Urine: NEGATIVE
Glucose, UA: NEGATIVE mg/dL
Hgb urine dipstick: NEGATIVE
Ketones, ur: NEGATIVE mg/dL
Nitrite: NEGATIVE
Protein, ur: NEGATIVE mg/dL
Specific Gravity, Urine: 1.014 (ref 1.005–1.030)
pH: 5 (ref 5.0–8.0)

## 2019-11-29 LAB — TROPONIN I (HIGH SENSITIVITY)
Troponin I (High Sensitivity): 86 ng/L — ABNORMAL HIGH (ref <18)
Troponin I (High Sensitivity): 107 ng/L (ref <18)
Troponin I (High Sensitivity): 102 ng/L (ref <18)
Troponin I (High Sensitivity): 107 ng/L (ref <18)

## 2019-11-29 LAB — RESPIRATORY PANEL BY RT PCR (FLU A&B, COVID)
Influenza A by PCR: NEGATIVE
Influenza B by PCR: NEGATIVE
SARS Coronavirus 2 by RT PCR: NEGATIVE

## 2019-11-29 LAB — CBG MONITORING, ED: Glucose-Capillary: 109 mg/dL — ABNORMAL HIGH (ref 70–99)

## 2019-11-29 LAB — BRAIN NATRIURETIC PEPTIDE: B Natriuretic Peptide: 95.8 pg/mL (ref 0.0–100.0)

## 2019-11-29 MED ORDER — ACETAMINOPHEN 650 MG RE SUPP: 650.0000 mg | Freq: Four times a day (QID) | RECTAL | Status: DC | PRN

## 2019-11-29 MED ORDER — ONDANSETRON HCL 4 MG PO TABS: 4.0000 mg | ORAL_TABLET | Freq: Four times a day (QID) | ORAL | Status: DC | PRN

## 2019-11-29 MED ORDER — ENOXAPARIN SODIUM 40 MG/0.4ML ~~LOC~~ SOLN: 40.0000 mg | Freq: Every day | SUBCUTANEOUS | Status: DC

## 2019-11-29 MED ORDER — SODIUM CHLORIDE 0.9% FLUSH: 3.0000 mL | Freq: Two times a day (BID) | INTRAVENOUS | Status: DC

## 2019-11-29 MED ORDER — ACETAMINOPHEN 325 MG PO TABS: 650.0000 mg | ORAL_TABLET | Freq: Four times a day (QID) | ORAL | Status: DC | PRN

## 2019-11-29 MED ORDER — ONDANSETRON HCL 4 MG/2ML IJ SOLN: 4.0000 mg | Freq: Four times a day (QID) | INTRAMUSCULAR | Status: DC | PRN

## 2019-11-29 NOTE — ED Notes (Signed)
Pt transported to CT ?

## 2019-11-29 NOTE — Discharge Summary (Signed)
Discharge Summary  [name]:[id number] DOB: Feb 26, 1941  PCP: Gayland Curry, DO  Admit date: 11/28/2019 Discharge date: 11/29/2019  Time spent: 30 mins  Recommendations for Outpatient Follow-up:  1. Cardiology  Discharge Diagnoses:  Active Hospital Problems   Diagnosis Date Noted  . Syncope 11/29/2019  . Aortic valve stenosis 03/14/2017  . Essential hypertension 12/27/2015  . Chronic diastolic heart failure ([place]) 06/05/2013  . Chronic kidney disease, stage III (moderate)     Resolved Hospital Problems  No resolved problems to display.    Discharge Condition: Left AMA  Diet recommendation: Left AMA  Vitals:   11/29/19 0600 11/29/19 0700  BP: (!) 129/49 (!) 114/49  Pulse: 60 62  Resp: 17 11  Temp:    SpO2: 97% 99%    History of present illness:  [name] is a 79 y.o. male with medical history significant of mod AS as of 01/2019, HFpEF, HTN. Pt brought to ER by ambulance after syncopal episode at home.  Patient's family found him sitting on the back deck and could not wake him up. Patient does not remember what happened, just remembers waking up with EMS around him. He reports that he feels fine currently. In the ED, troponins were initially 86-->107-->102, BNP 95. Creat 2.0 but this looks to be about baseline (was 1.9 earlier this month).  Patient admitted for further management.  Cardiology consulted.    Today, patient reports feeling fine, very eager to be discharged, continues to see repeatedly he wants to go home.  Patient denies any chest pain, shortness of breath, abdominal pain, nausea/vomiting, fever/chills.  Patient was waiting for cardiology Dr. Einar Gip to see him.  I believe after he was seen by Dr. Einar Gip, he left the ED without any appropriate discharge paperwork.  Unsure if he signed [facility] Course:  Principal Problem:   Syncope Active Problems:   Chronic kidney disease, stage III (moderate)   Chronic diastolic heart failure  (HCC)   Essential hypertension   Aortic valve stenosis  Syncope Cardiology Dr. Einar Gip saw patient, suggested probable conduction system disease like complete heart block or high degree AV block versus sinus arrest.  Unlikely to be seizure as there was no postictal state.  Likely to be CVA as there was no focal neurologic deficits Possible TIA Echo done pending result Carotid Doppler ordered Cardiology Dr. Einar Gip will arrange outpatient cardiac monitor for 2 weeks and an office visit, will also set up outpatient stress testing due to elevated troponins  Elevated troponin Outpatient stress testing as recommended by cardiologist Dr. Einar Gip EKG with no acute ST changes  CKD stage IIIb Creatinine at baseline  History of aortic stenosis Echo in June 2020 showed moderate aortic stenosis Repeat echo pending results  Chronic diastolic HF Appears euvolemic, BNP 95.8 Follow-up with cardiology           Malnutrition Type:      Malnutrition Characteristics:      Nutrition Interventions:      Estimated body mass index is 22.75 kg/m as calculated from the following:   Height as of this encounter: 5\' 11"  (1.803 m).   Weight as of this encounter: 74 kg.    Procedures: None  Consultations:  Cardiology Dr. Einar Gip  Discharge Exam: BP (!) 114/49   Pulse 62   Temp 97.7 F (36.5 C)   Resp 11   Ht 5\' 11"  (1.803 m)   Wt 74 kg   SpO2 99%   BMI 22.75 kg/m  General: NAD Cardiovascular: S1, S2 present Respiratory: CTA B  Discharge Instructions You were cared for by a hospitalist during your hospital stay. If you have any questions about your discharge medications or the care you received while you were in the hospital after you are discharged, you can call the unit and asked to speak with the hospitalist on call if the hospitalist that took care of you is not available. Once you are discharged, your primary care physician will handle any further medical issues. Please  note that NO REFILLS for any discharge medications will be authorized once you are discharged, as it is imperative that you return to your primary care physician (or establish a relationship with a primary care physician if you do not have one) for your aftercare needs so that they can reassess your need for medications and monitor your lab values.   Allergies as of 11/29/2019      Reactions   Penicillins Other (See Comments)   Unknown Has patient had a PCN reaction causing immediate rash, facial/tongue/throat swelling, SOB or lightheadedness with hypotension: {unknown Has patient had a PCN reaction causing severe rash involving mucus membranes or skin necrosis: unknown Has patient had a PCN reaction that required hospitalization {unknown Has patient had a PCN reaction occurring within the last 10 years: no If all of the above answers are "NO", then may proceed with Cephalosporin use.      Medication List    TAKE these medications   amLODipine 5 MG tablet Commonly known as: NORVASC TAKE 1 TABLET EVERY DAY   aspirin 81 MG tablet Take 81 mg by mouth daily.   Caltrate 600+D 600-800 MG-UNIT Tabs Generic drug: Calcium Carb-Cholecalciferol TAKE 1 TABLET TWICE DAILY   cilostazol 100 MG tablet Commonly known as: PLETAL TAKE 1 TABLET TWICE DAILY   ezetimibe 10 MG tablet Commonly known as: ZETIA Take 1 tablet (10 mg total) by mouth daily.   furosemide 20 MG tablet Commonly known as: LASIX TAKE ONE TABLET BY MOUTH EVERY MORNING AND TAKE ONE TABLET BY MOUTH EVERY EVENING What changed: See the new instructions.   latanoprost 0.005 % ophthalmic solution Commonly known as: XALATAN Place 1 drop into both eyes at bedtime.   losartan 100 MG tablet Commonly known as: COZAAR TAKE 1 TABLET EVERY DAY   pravastatin 80 MG tablet Commonly known as: PRAVACHOL TAKE 1 TABLET EVERY DAY TO CONTROL CHOLESTEROL What changed:   how much to take  how to take this  when to take  this  additional instructions   timolol 0.5 % ophthalmic solution Commonly known as: BETIMOL Place 1 drop into both eyes every morning.      Allergies  Allergen Reactions  . Penicillins Other (See Comments)    Unknown  Has patient had a PCN reaction causing immediate rash, facial/tongue/throat swelling, SOB or lightheadedness with hypotension: {unknown Has patient had a PCN reaction causing severe rash involving mucus membranes or skin necrosis: unknown Has patient had a PCN reaction that required hospitalization {unknown Has patient had a PCN reaction occurring within the last 10 years: no If all of the above answers are "NO", then may proceed with Cephalosporin use.      The results of significant diagnostics from this hospitalization (including imaging, microbiology, ancillary and laboratory) are listed below for reference.    Significant Diagnostic Studies: CT HEAD WO CONTRAST  Result Date: 11/29/2019 CLINICAL DATA:  Altered mental status EXAM: CT HEAD WITHOUT CONTRAST TECHNIQUE: Contiguous axial images were obtained from the  base of the skull through the vertex without intravenous contrast. COMPARISON:  January 25, 2007 FINDINGS: Brain: No evidence of acute territorial infarction, hemorrhage, hydrocephalus,extra-axial collection or mass lesion/mass effect. Area of encephalomalacia involving the right frontoparietal lobe. There is dilatation the ventricles and sulci consistent with age-related atrophy. Low-attenuation changes in the deep white matter consistent with small vessel ischemia. Vascular: No hyperdense vessel or unexpected calcification. Skull: The skull is intact. No fracture or focal lesion identified. Sinuses/Orbits: The visualized paranasal sinuses and mastoid air cells are clear. The orbits and globes intact. Other: None IMPRESSION: No acute intracranial abnormality. Findings consistent with age related atrophy and chronic small vessel ischemia The area of  encephalomalacia involving the right frontoparietal lobe Electronically Signed   By: Prudencio Pair M.D.   On: 11/29/2019 00:44   DG Chest Port 1 View  Result Date: 11/29/2019 CLINICAL DATA:  Syncope. EXAM: PORTABLE CHEST 1 VIEW COMPARISON:  June 19, 2008 FINDINGS: There is no evidence of acute infiltrate, pleural effusion or pneumothorax. The cardiac silhouette is mildly enlarged. There is mild calcification of the thoracic aorta. The visualized skeletal structures are unremarkable. IMPRESSION: No active disease. Electronically Signed   By: Virgina Norfolk M.D.   On: 11/29/2019 02:19    Microbiology: Recent Results (from the past 240 hour(s))  Respiratory Panel by RT PCR (Flu A&B, Covid) - Nasopharyngeal Swab     Status: None   Collection Time: 11/29/19  3:47 AM   Specimen: Nasopharyngeal Swab  Result Value Ref Range Status   SARS Coronavirus 2 by RT PCR NEGATIVE NEGATIVE Final    Comment: (NOTE) SARS-CoV-2 target nucleic acids are NOT DETECTED. The SARS-CoV-2 RNA is generally detectable in upper respiratoy specimens during the acute phase of infection. The lowest concentration of SARS-CoV-2 viral copies this assay can detect is 131 copies/mL. A negative result does not preclude SARS-Cov-2 infection and should not be used as the sole basis for treatment or other patient management decisions. A negative result may occur with  improper specimen collection/handling, submission of specimen other than nasopharyngeal swab, presence of viral mutation(s) within the areas targeted by this assay, and inadequate number of viral copies (<131 copies/mL). A negative result must be combined with clinical observations, patient history, and epidemiological information. The expected result is Negative. Fact Sheet for Patients:  PinkCheek.be Fact Sheet for Healthcare Providers:  GravelBags.it This test is not yet ap proved or cleared by the  [place] FDA and  has been authorized for detection and/or diagnosis of SARS-CoV-2 by FDA under an Emergency Use Authorization (EUA). This EUA will remain  in effect (meaning this test can be used) for the duration of the COVID-19 declaration under Section 564(b)(1) of the Act, 21 U.S.C. section 360bbb-3(b)(1), unless the authorization is terminated or revoked sooner.    Influenza A by PCR NEGATIVE NEGATIVE Final   Influenza B by PCR NEGATIVE NEGATIVE Final    Comment: (NOTE) The Xpert Xpress SARS-CoV-2/FLU/RSV assay is intended as an aid in  the diagnosis of influenza from Nasopharyngeal swab specimens and  should not be used as a sole basis for treatment. Nasal washings and  aspirates are unacceptable for Xpert Xpress SARS-CoV-2/FLU/RSV  testing. Fact Sheet for Patients: PinkCheek.be Fact Sheet for Healthcare Providers: GravelBags.it This test is not yet approved or cleared by the [place] FDA and  has been authorized for detection and/or diagnosis of SARS-CoV-2 by  FDA under an Emergency Use Authorization (EUA). This EUA will remain  in effect (meaning this test  can be used) for the duration of the  Covid-19 declaration under Section 564(b)(1) of the Act, 21  U.S.C. section 360bbb-3(b)(1), unless the authorization is  terminated or revoked. Performed at [facility], [place] [address] [place], [place] [address]      Labs: Basic Metabolic Panel: Recent Labs  Lab 11/28/19 1546  NA 140  K 4.5  CL 107  CO2 22  GLUCOSE 99  BUN 45*  CREATININE 2.07*  CALCIUM 9.9   Liver Function Tests: No results for input(s): AST, ALT, ALKPHOS, BILITOT, PROT, ALBUMIN in the last 168 hours. No results for input(s): LIPASE, AMYLASE in the last 168 hours. No results for input(s): AMMONIA in the last 168 hours. CBC: Recent Labs  Lab 11/28/19 1546  WBC 6.6  HGB 11.7*  HCT 38.4*  MCV 92.8  PLT 266   Cardiac  Enzymes: No results for input(s): CKTOTAL, CKMB, CKMBINDEX, TROPONINI in the last 168 hours. BNP: BNP (last 3 results) Recent Labs    11/29/19 0201  BNP 95.8    ProBNP (last 3 results) No results for input(s): PROBNP in the last 8760 hours.  CBG: Recent Labs  Lab 11/29/19 0432  GLUCAP 109*       Signed:  Alma Friendly, MD [facility] 11/29/2019, 11:07 AM

## 2019-11-29 NOTE — ED Notes (Signed)
Critical troponin 102 resulted discussed with admitting Dr. Alcario Drought. Pt continues to deny CP. No new orders received.

## 2019-11-29 NOTE — Progress Notes (Signed)
ICD-10-CM   1. Syncope and collapse  R55 LONG TERM MONITOR-LIVE TELEMETRY (3-14 DAYS)    PCV MYOCARDIAL PERFUSION WITH LEXISCAN  2. Abnormal cardiac enzyme level  R74.8 PCV MYOCARDIAL PERFUSION WITH Laurier Nancy, MD, [facility] 11/29/2019, 11:08 AM [facility] [place] Office: [phone]

## 2019-11-29 NOTE — ED Provider Notes (Addendum)
[facility] EMERGENCY DEPARTMENT Provider Note   CSN: [id number] Arrival date & time: 11/28/19  1532     History Chief Complaint  Patient presents with  . Loss of Consciousness    [name] is a 79 y.o. male.  Patient brought to the emergency department by ambulance from home after a syncopal episode.  Patient's family found him sitting on the back deck and could not wake him up.  Patient does not remember what happened, just remembers waking up with EMS around him.  He reports that he feels fine currently.  He denies headache, vision change, chest pain, heart palpitations, dizziness, weakness.  His only complaint is that he is hungry because he has been in the waiting room for 8 hours.        Past Medical History:  Diagnosis Date  . Anemia   . Cataract    traumatic  . Chronic diastolic heart failure ([place])   . Chronic kidney disease, stage III (moderate)   . Depressive disorder, not elsewhere classified   . Depressive disorder, not elsewhere classified   . Elevated prostate specific antigen (PSA)   . Elevated prostate specific antigen (PSA)   . Gingivitis   . 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 Hyperlipidemia   . Hypertension   . Hypertensive kidney disease, benign   . 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 Microscopic hematuria   . Nondependent cannabis abuse   . Pure hypercholesterolemia   . Unspecified late effects of cerebrovascular disease   . Unspecified late effects of cerebrovascular disease   . Unspecified vitamin D deficiency   . Unspecified vitamin D deficiency     Patient Active Problem List   Diagnosis Date Noted  . 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 Moderate aortic regurgitation 11/18/2017  . Aortic valve stenosis 03/14/2017  . 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 Claudication of lower extremity ([place]) 07/26/2016  . Glaucoma 12/27/2015  . Essential hypertension 12/27/2015  . Anemia in chronic renal disease 11/12/2014  . Hyperglycemia 11/12/2014  . Carotid stenosis, symptomatic, with infarction ([place]) 06/05/2013  . 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 Chronic diastolic heart failure ([place]) 06/05/2013  . Hypertensive heart disease with acute on chronic diastolic congestive heart failure ([place]) 06/05/2013  . Hyperlipidemia   . Chronic kidney disease, stage III (moderate)     Past Surgical History:  Procedure Laterality Date  . CATARACT EXTRACTION    . CORONARY STENT PLACEMENT     2008  . EYE SURGERY     left eye,    cornea repair  . PERIPHERAL VASCULAR CATHETERIZATION N/A 07/28/2016   Procedure: Lower Extremity Angiography;  Surgeon: Adrian Prows, MD;  Location: [facility];  Service: Cardiovascular;  Laterality: N/A;       Family History  Problem Relation Age of Onset  . Cancer Father     Social History   Tobacco Use  . Smoking status: Former Smoker    Packs/day: 0.50    Years: 13.00    Pack years: 6.50    Quit date: 03/09/1971    Years since quitting: 48.7  . Smokeless tobacco: Never Used  Substance Use Topics  . Alcohol use: No    Alcohol/week: 0.0 standard drinks  . Drug use:  No    Home Medications Prior to Admission medications   Medication Sig Start Date End Date Taking? Authorizing Provider  amLODipine (NORVASC) 5 MG tablet TAKE 1 TABLET EVERY DAY 05/23/19   Reed, [name] L, DO  aspirin 81 MG tablet Take 81 mg by mouth daily.      [provider]  CALTRATE 600+D 600-800 MG-UNIT TABS TAKE 1 TABLET TWICE DAILY 06/30/16   Reed, [name] L, DO  cilostazol (PLETAL) 100 MG tablet TAKE 1 TABLET TWICE DAILY 06/22/19   Adrian Prows, MD  ezetimibe (ZETIA) 10 MG tablet Take 1 tablet (10 mg total) by mouth daily. 11/06/19    Reed, [name] L, DO  furosemide (LASIX) 20 MG tablet TAKE ONE TABLET BY MOUTH EVERY MORNING AND TAKE ONE TABLET BY MOUTH EVERY EVENING 05/23/19   Reed, [name] L, DO  latanoprost (XALATAN) 0.005 % ophthalmic solution Place 1 drop into both eyes at bedtime.  12/18/15   [provider]  losartan (COZAAR) 100 MG tablet TAKE 1 TABLET EVERY DAY 08/30/19   Reed, [name] L, DO  pravastatin (PRAVACHOL) 80 MG tablet TAKE 1 TABLET EVERY DAY TO CONTROL CHOLESTEROL 05/08/19   Reed, [name] L, DO  timolol (BETIMOL) 0.5 % ophthalmic solution Place 1 drop into both eyes every morning.     [provider]    Allergies    Penicillins  Review of Systems   Review of Systems  Respiratory: Negative.   Cardiovascular: Negative.   Neurological: Positive for syncope.  All other systems reviewed and are negative.   Physical Exam Updated Vital Signs BP (!) 104/35   Pulse 71   Temp 97.7 F (36.5 C)   Resp 14   Ht 5\' 11"  (1.803 m)   Wt 74 kg   SpO2 96%   BMI 22.75 kg/m   Physical Exam Vitals and nursing note reviewed.  Constitutional:      General: He is not in acute distress.    Appearance: Normal appearance. He is well-developed.  HENT:     Head: Normocephalic and atraumatic.     Right Ear: Hearing normal.     Left Ear: Hearing normal.     Nose: Nose normal.  Eyes:     Conjunctiva/sclera: Conjunctivae normal.     Pupils: Pupils are equal, round, and reactive to light.  Cardiovascular:     Rate and Rhythm: Regular rhythm.     Heart sounds: S1 normal and S2 normal. No murmur. No friction rub. No gallop.   Pulmonary:     Effort: Pulmonary effort is normal. No respiratory distress.     Breath sounds: Normal breath sounds.  Chest:     Chest wall: No tenderness.  Abdominal:     General: Bowel sounds are normal.     Palpations: Abdomen is soft.     Tenderness: There is no abdominal tenderness. There is no guarding or rebound. Negative signs include Murphy's sign and McBurney's  sign.     Hernia: No hernia is present.  Musculoskeletal:        General: Normal range of motion.     Cervical back: Normal range of motion and neck supple.  Skin:    General: Skin is warm and dry.     Findings: No rash.  Neurological:     Mental Status: He is alert and oriented to person, place, and time.     GCS: GCS eye subscore is 4. GCS verbal subscore is 5. GCS motor subscore is 6.  Cranial Nerves: No cranial nerve deficit.     Sensory: No sensory deficit.     Coordination: Coordination normal.     Comments: Extraocular muscle movement: normal No visual field cut Pupils: equal and reactive both direct and consensual response is normal No nystagmus present    Sensory function is intact to light touch, pinprick Proprioception intact  Grip strength 5/5 symmetric in upper extremities No pronator drift Normal finger to nose bilaterally  Lower extremity strength 5/5 against gravity Normal heel to shin bilaterally  Gait: normal   Psychiatric:        Speech: Speech normal.        Behavior: Behavior normal.        Thought Content: Thought content normal.     ED Results / Procedures / Treatments   Labs (all labs ordered are listed, but only abnormal results are displayed) Labs Reviewed  BASIC METABOLIC PANEL - Abnormal; Notable for the following components:      Result Value   BUN 45 (*)    Creatinine, Ser 2.07 (*)    GFR calc non Af Amer 30 (*)    GFR calc Af Amer 35 (*)    All other components within normal limits  CBC - Abnormal; Notable for the following components:   RBC 4.14 (*)    Hemoglobin 11.7 (*)    HCT 38.4 (*)    All other components within normal limits  URINALYSIS, ROUTINE W REFLEX MICROSCOPIC - Abnormal; Notable for the following components:   Leukocytes,Ua TRACE (*)    All other components within normal limits  TROPONIN I (HIGH SENSITIVITY) - Abnormal; Notable for the following components:   Troponin I (High Sensitivity) 86 (*)    All other  components within normal limits  TROPONIN I (HIGH SENSITIVITY) - Abnormal; Notable for the following components:   Troponin I (High Sensitivity) 107 (*)    All other components within normal limits  RESPIRATORY PANEL BY RT PCR (FLU A&B, COVID)  BRAIN NATRIURETIC PEPTIDE    EKG EKG Interpretation  Date/Time:  Tuesday November 28 2019 15:33:53 EDT Ventricular Rate:  77 PR Interval:  172 QRS Duration: 148 QT Interval:  438 QTC Calculation: 495 R Axis:   -30 Text Interpretation: Normal sinus rhythm Left axis deviation Right bundle branch block Minimal voltage criteria for LVH, may be normal variant ( R in aVL ) Septal infarct , age undetermined Abnormal ECG No significant change since prior EKG Confirmed by Orpah Greek [phone]) on 11/29/2019 12:06:38 AM   Radiology CT HEAD WO CONTRAST  Result Date: 11/29/2019 CLINICAL DATA:  Altered mental status EXAM: CT HEAD WITHOUT CONTRAST TECHNIQUE: Contiguous axial images were obtained from the base of the skull through the vertex without intravenous contrast. COMPARISON:  January 25, 2007 FINDINGS: Brain: No evidence of acute territorial infarction, hemorrhage, hydrocephalus,extra-axial collection or mass lesion/mass effect. Area of encephalomalacia involving the right frontoparietal lobe. There is dilatation the ventricles and sulci consistent with age-related atrophy. Low-attenuation changes in the deep white matter consistent with small vessel ischemia. Vascular: No hyperdense vessel or unexpected calcification. Skull: The skull is intact. No fracture or focal lesion identified. Sinuses/Orbits: The visualized paranasal sinuses and mastoid air cells are clear. The orbits and globes intact. Other: None IMPRESSION: No acute intracranial abnormality. Findings consistent with age related atrophy and chronic small vessel ischemia The area of encephalomalacia involving the right frontoparietal lobe Electronically Signed   By: Prudencio Pair M.D.   On:  11/29/2019 00:44  DG Chest Port 1 View  Result Date: 11/29/2019 CLINICAL DATA:  Syncope. EXAM: PORTABLE CHEST 1 VIEW COMPARISON:  June 19, 2008 FINDINGS: There is no evidence of acute infiltrate, pleural effusion or pneumothorax. The cardiac silhouette is mildly enlarged. There is mild calcification of the thoracic aorta. The visualized skeletal structures are unremarkable. IMPRESSION: No active disease. Electronically Signed   By: Virgina Norfolk M.D.   On: 11/29/2019 02:19    Procedures Procedures (including critical care time)  Medications Ordered in ED Medications - No data to display  ED Course  I have reviewed the triage vital signs and the nursing notes.  Pertinent labs & imaging results that were available during my care of the patient were reviewed by me and considered in my medical decision making (see chart for details).    MDM Rules/Calculators/A&P                      Patient brought to the emergency department for evaluation of syncope.  Patient does not quite remember the episode, denies any symptoms prior or since.  He reportedly was found unresponsive and family could not wake him up but by the time EMS got there he was awake.  He feels well currently.  He does have a history of chronic renal insufficiency, creatinine is slightly worse than his baseline.  EKG shows his previously known right bundle branch block and left ventricular hypertrophy, no acute changes.  First troponin, however, is elevated at 86.  Second troponin is 107.  He does have a history of diastolic heart failure but does not appear to have decompensated heart failure at this time that would explain the troponins.  He does have a history of severe peripheral arterial disease but no known coronary artery disease.  Doubt PE as a cause of his troponins and syncope, cannot perform CT angiography at this time.  I would be most concerned about malignant arrhythmia causing syncope.  Discussed with Dr. Einar Gip,  his primary cardiologist.  Does not recommend heparinization at this time, asks for hospitalist to admit and he will see patient in the morning.  Final Clinical Impression(s) / ED Diagnoses Final diagnoses:  Syncope, unspecified syncope type    Rx / DC Orders ED Discharge Orders    None       Rai Severns, Gwenyth Allegra, MD 11/29/19 3710    Orpah Greek, MD 11/29/19 [phone]

## 2019-11-29 NOTE — H&P (Addendum)
History and Physical    VADIM CENTOLA QHU:[id number] DOB: Mar 13, 1941 DOA: 11/28/2019  PCP: Gayland Curry, DO  Patient coming from: Home  I have personally briefly reviewed patient's old medical records in [place]  Chief Complaint: Syncope  HPI: [name] is a 79 y.o. male with medical history significant of mod AS as of 01/2019, HFpEF, HTN.  Pt brought to ER by ambulance after syncopal episode at home.  Patient's family found him sitting on the back deck and could not wake him up.  Patient does not remember what happened, just remembers waking up with EMS around him.  He reports that he feels fine currently.   ED Course: Concerning however is that patient's troponins were initially 86, and rose to 107 on repeat.  BNP 95.  Creat 2.0 but this looks to be about baseline (was 1.9 earlier this month).   Review of Systems: As per HPI, otherwise all review of systems negative.  Past Medical History:  Diagnosis Date  . Anemia   . Cataract    traumatic  . Chronic diastolic heart failure ([place])   . Chronic kidney disease, stage III (moderate)   . Depressive disorder, not elsewhere classified   . Depressive disorder, not elsewhere classified   . Elevated prostate specific antigen (PSA)   . Elevated prostate specific antigen (PSA)   . Gingivitis   . 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 Hyperlipidemia   . Hypertension   . Hypertensive kidney disease, benign   . 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 Microscopic hematuria   . Nondependent cannabis abuse   . Pure hypercholesterolemia   . Unspecified late effects of cerebrovascular disease   . Unspecified late effects of cerebrovascular disease   . Unspecified vitamin D deficiency   . Unspecified vitamin D deficiency     Past Surgical History:  Procedure Laterality Date  . CATARACT EXTRACTION     . CORONARY STENT PLACEMENT     2008  . EYE SURGERY     left eye,    cornea repair  . PERIPHERAL VASCULAR CATHETERIZATION N/A 07/28/2016   Procedure: Lower Extremity Angiography;  Surgeon: Adrian Prows, MD;  Location: [facility];  Service: Cardiovascular;  Laterality: N/A;     reports that he quit smoking about 48 years ago. He has a 6.50 pack-year smoking history. He has never used smokeless tobacco. He reports that he does not drink alcohol or use drugs.  Allergies  Allergen Reactions  . Penicillins Other (See Comments)    Unknown  Has patient had a PCN reaction causing immediate rash, facial/tongue/throat swelling, SOB or lightheadedness with hypotension: {unknown Has patient had a PCN reaction causing severe rash involving mucus membranes or skin necrosis: unknown Has patient had a PCN reaction that required hospitalization {unknown Has patient had a PCN reaction occurring within the last 10 years: no If all of the above answers are "NO", then may proceed with Cephalosporin use.    Family History  Problem Relation Age of Onset  . Cancer Father      Prior to Admission medications   Medication Sig Start Date End Date Taking? Authorizing Provider  amLODipine (NORVASC) 5 MG tablet TAKE 1 TABLET EVERY DAY 05/23/19   Reed, [name] L, DO  aspirin 81 MG tablet Take 81 mg by mouth daily.      [provider]  CALTRATE 600+D 600-800 MG-UNIT TABS TAKE 1 TABLET TWICE DAILY 06/30/16   Reed, [name] L, DO  cilostazol (PLETAL) 100 MG tablet TAKE 1 TABLET TWICE DAILY 06/22/19   Adrian Prows, MD  ezetimibe (ZETIA) 10 MG tablet Take 1 tablet (10 mg total) by mouth daily. 11/06/19   Reed, [name] L, DO  furosemide (LASIX) 20 MG tablet TAKE ONE TABLET BY MOUTH EVERY MORNING AND TAKE ONE TABLET BY MOUTH EVERY EVENING 05/23/19   Reed, [name] L, DO  latanoprost (XALATAN) 0.005 % ophthalmic solution Place 1 drop into both eyes at bedtime.  12/18/15   [provider]  losartan  (COZAAR) 100 MG tablet TAKE 1 TABLET EVERY DAY 08/30/19   Reed, [name] L, DO  pravastatin (PRAVACHOL) 80 MG tablet TAKE 1 TABLET EVERY DAY TO CONTROL CHOLESTEROL 05/08/19   Reed, [name] L, DO  timolol (BETIMOL) 0.5 % ophthalmic solution Place 1 drop into both eyes every morning.     [provider]    Physical Exam: Vitals:   11/29/19 0150 11/29/19 0200 11/29/19 0215 11/29/19 0300  BP: (!) 116/55 (!) 146/58 (!) 119/42 (!) 104/35  Pulse: 65 69 64 71  Resp: 12 (!) 22 16 14   Temp:      SpO2: 97% 98% 99% 96%  Weight:      Height:        Constitutional: NAD, calm, comfortable Eyes: PERRL, lids and conjunctivae normal ENMT: Mucous membranes are moist. Posterior pharynx clear of any exudate or lesions.Normal dentition.  Neck: normal, supple, no masses, no thyromegaly Respiratory: clear to auscultation bilaterally, no wheezing, no crackles. Normal respiratory effort. No accessory muscle use.  Cardiovascular: SEM present Abdomen: no tenderness, no masses palpated. No hepatosplenomegaly. Bowel sounds positive.  Musculoskeletal: no clubbing / cyanosis. No joint deformity upper and lower extremities. Good ROM, no contractures. Normal muscle tone.  Skin: no rashes, lesions, ulcers. No induration Neurologic: CN 2-12 grossly intact. Sensation intact, DTR normal. Strength 5/5 in all 4.  Psychiatric: Normal judgment and insight. Alert and oriented x 3. Normal mood.    Labs on Admission: I have personally reviewed following labs and imaging studies  CBC: Recent Labs  Lab 11/28/19 1546  WBC 6.6  HGB 11.7*  HCT 38.4*  MCV 92.8  PLT 854   Basic Metabolic Panel: Recent Labs  Lab 11/28/19 1546  NA 140  K 4.5  CL 107  CO2 22  GLUCOSE 99  BUN 45*  CREATININE 2.07*  CALCIUM 9.9   GFR: Estimated Creatinine Clearance: 30.8 mL/min (A) (by C-G formula based on SCr of 2.07 mg/dL (H)). Liver Function Tests: No results for input(s): AST, ALT, ALKPHOS, BILITOT, PROT, ALBUMIN in the  last 168 hours. No results for input(s): LIPASE, AMYLASE in the last 168 hours. No results for input(s): AMMONIA in the last 168 hours. Coagulation Profile: No results for input(s): INR, PROTIME in the last 168 hours. Cardiac Enzymes: No results for input(s): CKTOTAL, CKMB, CKMBINDEX, TROPONINI in the last 168 hours. BNP (last 3 results) No results for input(s): PROBNP in the last 8760 hours. HbA1C: No results for input(s): HGBA1C in the last 72 hours. CBG: No results for input(s): GLUCAP in the last 168 hours. Lipid Profile: No results for input(s): CHOL, HDL, LDLCALC, TRIG, CHOLHDL, LDLDIRECT in the last 72 hours. Thyroid Function Tests: No results for input(s): TSH, T4TOTAL, FREET4, T3FREE, THYROIDAB in the last 72 hours. Anemia Panel: No results for input(s): VITAMINB12, FOLATE, FERRITIN, TIBC, IRON, RETICCTPCT in the last 72 hours. Urine  analysis:    Component Value Date/Time   COLORURINE YELLOW 11/28/2019 2323   APPEARANCEUR CLEAR 11/28/2019 2323   LABSPEC 1.014 11/28/2019 2323   PHURINE 5.0 11/28/2019 2323   GLUCOSEU NEGATIVE 11/28/2019 2323   HGBUR NEGATIVE 11/28/2019 2323   BILIRUBINUR NEGATIVE 11/28/2019 2323   KETONESUR NEGATIVE 11/28/2019 2323   PROTEINUR NEGATIVE 11/28/2019 2323   UROBILINOGEN 1.0 01/02/2007 0322   NITRITE NEGATIVE 11/28/2019 2323   LEUKOCYTESUR TRACE (A) 11/28/2019 2323    Radiological Exams on Admission: CT HEAD WO CONTRAST  Result Date: 11/29/2019 CLINICAL DATA:  Altered mental status EXAM: CT HEAD WITHOUT CONTRAST TECHNIQUE: Contiguous axial images were obtained from the base of the skull through the vertex without intravenous contrast. COMPARISON:  January 25, 2007 FINDINGS: Brain: No evidence of acute territorial infarction, hemorrhage, hydrocephalus,extra-axial collection or mass lesion/mass effect. Area of encephalomalacia involving the right frontoparietal lobe. There is dilatation the ventricles and sulci consistent with age-related atrophy.  Low-attenuation changes in the deep white matter consistent with small vessel ischemia. Vascular: No hyperdense vessel or unexpected calcification. Skull: The skull is intact. No fracture or focal lesion identified. Sinuses/Orbits: The visualized paranasal sinuses and mastoid air cells are clear. The orbits and globes intact. Other: None IMPRESSION: No acute intracranial abnormality. Findings consistent with age related atrophy and chronic small vessel ischemia The area of encephalomalacia involving the right frontoparietal lobe Electronically Signed   By: Prudencio Pair M.D.   On: 11/29/2019 00:44   DG Chest Port 1 View  Result Date: 11/29/2019 CLINICAL DATA:  Syncope. EXAM: PORTABLE CHEST 1 VIEW COMPARISON:  June 19, 2008 FINDINGS: There is no evidence of acute infiltrate, pleural effusion or pneumothorax. The cardiac silhouette is mildly enlarged. There is mild calcification of the thoracic aorta. The visualized skeletal structures are unremarkable. IMPRESSION: No active disease. Electronically Signed   By: Virgina Norfolk M.D.   On: 11/29/2019 02:19    EKG: Independently reviewed.  Assessment/Plan Principal Problem:   Syncope Active Problems:   Chronic kidney disease, stage III (moderate)   Chronic diastolic heart failure (HCC)   Essential hypertension   Aortic valve stenosis    1. Syncope - 1. EDP spoke with Dr. Einar Gip, who said to admit patient and he will see in consult in AM 2. Dr. Einar Gip said no heparin for now, trend trops 3. Syncope pathway 4. Serial trops 5. Tele monitor 6. 2d echo 7. NPO due to trop elevation for now till Dr. Einar Gip sees him 8. No CP nor symptoms currently 2. CKD stage 3b - 1. Chronic, stable, baseline 3. HTN - 1. Cont home meds once med rec is completed 4. Aortic stenosis - 1. Moderate by echo in June 2020 2. Will see what this is doing on repeat echo today 5. Chronic diastolic CHF - 1. Doesn't seem to be in exacerbation currently 2. Cont home meds  once med rec completed  DVT prophylaxis: Lovenox Code Status: Full Family Communication: No family in room Disposition Plan: Home after syncope work up completed Consults called: Dr. Einar Gip Admission status: Place in [place], [place] Hospitalists  How to contact the [facility] Attending or Consulting provider [place] or covering provider during after hours [place], for this patient?  1. Check the care team in [facility] and look for a) attending/consulting [facility] provider listed and b) the [facility] team listed 2. Log into [email or web address]  Amion Physician Scheduling and messaging for groups and whole hospitals  On call and physician  scheduling software for group practices, residents, hospitalists and other medical providers for call, clinic, rotation and shift schedules. OnCall [employer] is a hospital-wide system for scheduling doctors and paging doctors on call. EasyPlot is for scientific plotting and data analysis.  [email or web address]  and use [place]'s universal password to access. If you do not have the password, please contact the hospital operator.  3. Locate the [facility] provider you are looking for under [facility] and page to a number that you can be directly reached. 4. If you still have difficulty reaching the provider, please page the [facility] (Director on Call) for the Hospitalists listed on amion for assistance.  11/29/2019, 3:31 AM

## 2019-11-29 NOTE — Consult Note (Signed)
CARDIOLOGY CONSULT NOTE  Patient ID: [name] MRN: [id number] DOB/AGE: [birth year] 79 y.o.  Admit date: 11/28/2019 Referring Physician  Jennette Kettle, DO Primary Physician:  Gayland Curry, DO Reason for Consultation  Syncope  Patient ID: [name], male    DOB: May 06, 1941, 79 y.o.   MRN: [id number]  Chief Complaint  Patient presents with  . Loss of Consciousness   HPI:    [name]  is a 79 y.o. 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  Since being in the emergency room he has been asymptomatic and is back to his baseline.  Except for spontaneous easy bruising, evaluated by hematology yesterday, felt to be due to aspirin and Pletal.  No other specific complaints.  CT of the head did not reveal any new changes or mass or bleeding.  No loss of bowel or bladder control, no postictal state, no involuntary movements.  Past Medical History:  Diagnosis Date  . Anemia   . Cataract    traumatic  . Chronic diastolic heart failure ([place])   . Chronic kidney disease, stage III (moderate)   . Depressive disorder, not elsewhere classified   . Depressive disorder, not elsewhere classified   . Elevated prostate specific antigen (PSA)   . Elevated prostate specific antigen (PSA)   . Gingivitis   . 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 Hyperlipidemia   . Hypertension   . Hypertensive kidney disease, benign   . 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  Microscopic hematuria   . Nondependent cannabis abuse   . Pure hypercholesterolemia   . Unspecified late effects of cerebrovascular disease   . Unspecified late effects of cerebrovascular disease   . Unspecified vitamin D deficiency   . Unspecified vitamin D deficiency    Past Surgical History:  Procedure Laterality Date  . CATARACT EXTRACTION    . CORONARY STENT PLACEMENT     2008  . EYE SURGERY     left eye,    cornea repair  . PERIPHERAL VASCULAR CATHETERIZATION N/A 07/28/2016   Procedure: Lower Extremity Angiography;  Surgeon: Adrian Prows, MD;  Location: [facility];  Service: Cardiovascular;  Laterality: N/A;   Social History   Socioeconomic History  . Marital status: Widowed    Spouse name: Not on file  . Number of children: 2  . Years of education: Not on file  . Highest education level: Not on file  Occupational History  . Not on file  Tobacco Use  . Smoking status: Former Smoker    Packs/day: 0.50    Years: 13.00    Pack years: 6.50    Quit date: 03/09/1971    Years since quitting: 48.7  . Smokeless tobacco: Never Used  Substance and Sexual Activity  . Alcohol use: No    Alcohol/week: 0.0 standard drinks  . Drug use: No  . Sexual activity: Not on file  Other Topics Concern  . Not on file  Social History Narrative  . Not on  file   Social Determinants of Health   Financial Resource Strain:   . Difficulty of Paying Living Expenses:   Food Insecurity:   . Worried About Charity fundraiser in the Last Year:   . Arboriculturist in the Last Year:   Transportation Needs:   . Film/video editor (Medical):   Marland Kitchen Lack of Transportation (Non-Medical):   Physical Activity:   . Days of Exercise per Week:   . Minutes of Exercise per Session:   Stress:   . Feeling of Stress :   Social Connections:   . Frequency of Communication with Friends and Family:   . Frequency of Social Gatherings with Friends and Family:   . Attends Religious Services:   . Active  Member of Clubs or Organizations:   . Attends Archivist Meetings:   Marland Kitchen Marital Status:   Intimate Partner Violence:   . Fear of Current or Ex-Partner:   . Emotionally Abused:   Marland Kitchen Physically Abused:   . Sexually Abused:    ROS  Review of Systems  Cardiovascular: Negative for chest pain, dyspnea on exertion and leg swelling.  Gastrointestinal: Negative for melena.  Neurological: Positive for focal weakness (left hemiparesis).   Objective   Vitals with BMI 11/29/2019 11/29/2019 11/29/2019  Height - - -  Weight - - -  BMI - - -  Systolic 740 814 481  Diastolic 49 49 58  Pulse 62 60 68    Blood pressure (!) 114/49, pulse 62, temperature 97.7 F (36.5 C), resp. rate 11, height 5\' 11"  (1.803 m), weight 74 kg, SpO2 99 %.    Physical Exam  Constitutional: He is oriented to person, place, and time. He appears well-nourished.  Cardiovascular: Normal rate, regular rhythm and intact distal pulses. Exam reveals no gallop.  Murmur heard.  Scratchy early systolic murmur is present with a grade of 2/6 at the upper right sternal border. High-pitched blowing decrescendo early diastolic murmur is present with a grade of 3/6 at the upper right sternal border. Pulses:      Dorsalis pedis pulses are 0 on the right side and 0 on the left side.       Posterior tibial pulses are 0 on the right side and 0 on the left side.  No leg edema, no JVD.  Pulmonary/Chest: Effort normal and breath sounds normal.  Abdominal: Soft. Bowel sounds are normal.  Neurological: He is alert and oriented to person, place, and time.  Mild left hemiparesis present  Skin: Skin is warm.   Laboratory examination:   Recent Labs    12/05/18 0834 11/15/19 0834 11/28/19 1546  NA 140 140 140  K 4.5 5.0 4.5  CL 106 106 107  CO2 29 27 22   GLUCOSE 105* 103* 99  BUN 39* 43* 45*  CREATININE 1.50* 1.92* 2.07*  CALCIUM 9.9 10.0 9.9  GFRNONAA 44*  --  30*  GFRAA 51*  --  35*   estimated creatinine clearance is  30.8 mL/min (A) (by C-G formula based on SCr of 2.07 mg/dL (H)).  CMP Latest Ref Rng & Units 11/28/2019 11/15/2019 12/05/2018  Glucose 70 - 99 mg/dL 99 103(H) 105(H)  BUN 8 - 23 mg/dL 45(H) 43(H) 39(H)  Creatinine 0.61 - 1.24 mg/dL 2.07(H) 1.92(H) 1.50(H)  Sodium 135 - 145 mmol/L 140 140 140  Potassium 3.5 - 5.1 mmol/L 4.5 5.0 4.5  Chloride 98 - 111 mmol/L 107 106 106  CO2 22 - 32 mmol/L 22 27  29  Calcium 8.9 - 10.3 mg/dL 9.9 10.0 9.9  Total Protein 6.1 - 8.1 g/dL - - 7.7  Total Bilirubin 0.2 - 1.2 mg/dL - - 0.3  Alkaline Phos 40 - 115 U/L - - -  AST 10 - 35 U/L - - 16  ALT 9 - 46 U/L - - 9   CBC Latest Ref Rng & Units 11/28/2019 08/24/2019 06/15/2019  WBC 4.0 - 10.5 K/uL 6.6 5.7 5.1  Hemoglobin 13.0 - 17.0 g/dL 11.7(L) 11.8(L) 12.3(L)  Hematocrit 39.0 - 52.0 % 38.4(L) 36.2(L) 38.8  Platelets 150 - 400 K/uL 266 266 261   Lipid Panel     Component Value Date/Time   CHOL 153 11/15/2019 0834   CHOL 143 09/20/2015 0855   TRIG 81 11/15/2019 0834   HDL 45 11/15/2019 0834   HDL 48 09/20/2015 0855   CHOLHDL 3.4 11/15/2019 0834   VLDL 13 03/23/2016 0820   LDLCALC 91 11/15/2019 0834   HEMOGLOBIN A1C Lab Results  Component Value Date   HGBA1C 6.2 (H) 12/05/2018   MPG 131 12/05/2018   TSH No results for input(s): TSH in the last 8760 hours. BNP (last 3 results) Recent Labs    11/29/19 0201  BNP 95.8    Ref Range & Units 03:44 12/02/19  02:01 12/02/19  00:09 12/02/19   Troponin I (High Sensitivity) <18 ng/L 102High Panic   107High Panic  CM  86High  CM    Medications and allergies   Allergies  Allergen Reactions  . Penicillins Other (See Comments)    Unknown  Has patient had a PCN reaction causing immediate rash, facial/tongue/throat swelling, SOB or lightheadedness with hypotension: {unknown Has patient had a PCN reaction causing severe rash involving mucus membranes or skin necrosis: unknown Has patient had a PCN reaction that required hospitalization {unknown Has patient  had a PCN reaction occurring within the last 10 years: no If all of the above answers are "NO", then may proceed with Cephalosporin use.     Current Outpatient Medications  Medication Instructions  . amLODipine (NORVASC) 5 MG tablet TAKE 1 TABLET EVERY DAY  . aspirin 81 mg, Oral, Daily  . CALTRATE 600+D 600-800 MG-UNIT TABS TAKE 1 TABLET TWICE DAILY  . cilostazol (PLETAL) 100 MG tablet TAKE 1 TABLET TWICE DAILY  . ezetimibe (ZETIA) 10 mg, Oral, Daily  . furosemide (LASIX) 20 MG tablet TAKE ONE TABLET BY MOUTH EVERY MORNING AND TAKE ONE TABLET BY MOUTH EVERY EVENING  . latanoprost (XALATAN) 0.005 % ophthalmic solution 1 drop, Both Eyes, Daily at bedtime  . losartan (COZAAR) 100 MG tablet TAKE 1 TABLET EVERY DAY  . pravastatin (PRAVACHOL) 80 MG tablet TAKE 1 TABLET EVERY DAY TO CONTROL CHOLESTEROL  . timolol (BETIMOL) 0.5 % ophthalmic solution 1 drop, Both Eyes, BH-each morning    No intake/output data recorded. No intake/output data recorded.   Radiology:   Imaging: No results found. Cardiac Studies:   Peripheral Angiogram [29-Jul-2016]: Moderate diffuse SFA disease. Right below knee severe diffuse disease with PT run-off. Left PT and Peroneal run-off, diffuse disease.  Lower Extremity Doppler [06/16/2016]:  No hemodynamically significant stenoses are identified in the right lower extremity arterial system. There is severe diffuse mixed plaque throughout. Moderate velocity increase at the left mid superficial femoral artery suggesting >50% stenosis. There is severe diffuse mixed plaque througout. This exam reveals severely decreased perfusion of the right lower extremity with RABI 0.46 and moderately decreased perfusion of the left lower extremity with LABI 0.69, noted  at the post tibial artery level.  Peripheral arteriogram 07/28/2016: Moderate diffuse SFA disease. Right below knee severe diffuse disease with PT run-off. Left PT and Peroneal run-off, diffuse disease  Carotid  Doppler  08/12/2017: Mild stenosis in the right internal carotid artery (1-15%). Minimal stenosis in the right common carotid artery (<50%). Antegrade right vertebral artery flow. Bidirectional left vertebral artery flow. May suggest proximal left subclavian stenosis. Compared to the study done on 08/12/2016, right ICA stenosis of less than 50% no longer present. Further studies when clinically indicated.  Echocardiogram 11/29/2019:   1. Left ventricular ejection fraction, by estimation, is 50 to 55%. The  left ventricle has low normal function. The left ventricle demonstrates regional wall motion abnormalities. There is moderate  concentric left ventricular hypertrophy. Left ventricular diastolic  parameters are consistent with Grade I diastolic dysfunction (impaired  relaxation). Elevated left ventricular end-diastolic pressure. There is  hypokinesis of the left ventricular,  basal-mid inferior wall.  2. Right ventricular systolic function is normal. The right ventricular  size is normal.  3. Left atrial size was moderately dilated.  4. The mitral valve is normal in structure. Mild to moderate mitral valve  regurgitation. No evidence of mitral stenosis.  5. There is moderate calcification of the aortic valve. . The aortic  valve is tricuspid. Aortic valve regurgitation is moderate. Mild aortic  valve stenosis. Aortic valve area, by VTI measures 1.58 cm. Aortic valve  mean gradient measures 21.0 mmHg.  Aortic valve Vmax measures 3.10 m/s.  6. The inferior vena cava is normal in size with greater than 50%  respiratory variability, suggesting right atrial pressure of 3 mmHg.  EKG:  EKG 03/30/2020: Normal sinus rhythm at rate of 89 bpm, treatment lodgment, left axis deviation, left anterior fascicular block.  Right bundle branch block.  Bifascicular block.  LVH.  Compared to prior EKG from 2019, repolarization abnormality in the lateral leads in the form of T wave inversion is not  present.  Assessment   1. Syncope and collapse 2.  Abnormal EKG with bifascicular block 3.  Essential hypertension 4.  Peripheral arterial disease 5.  History of stroke with residual left-sided hemiparesis. 6. Abnormal echocardiogram with wall motion abnormality. 7. Abnormal S. Troponin   Recommendations:   Patient's acute onset symptoms without premonitory symptoms suggest probably conduction system disease like complete heart block or high degree AV block versus sinus arrest.  Unlikely to be a seizure as there was no postictal state.  Also Unstable angina cannot be ruled out in view of wall motion abnormality on echo and also abnormal HS troponin.  He will need remote outpatient cardiac telemetry for 2 weeks and office visit.  Do not think valvular abnormality of moderate aortic regurgitation is important right now.    I will also set him up for outpatient stress testing as he has not had a stress test recently.  Patient wants to walked out AMA, he can be discharged home, his goddaughter Jonelle Sidle  is present, they will go to the office to get the event monitor.  I will see him back after the test.  Adrian Prows, MD, [facility] 12/02/2019, 11:36 AM [facility] [place] Pager: [phone] Office: [phone]

## 2019-11-29 NOTE — ED Notes (Signed)
Pt family member called RN requesting information regarding pt status. Family member states to RN 'someone who is not a nurse is taking blood from my father." RN attempted to explain to the family member that a nurse tech was taking a blood sugar. Pt family went on to yell at RN due to there not being a nurse taking care of the pt. Family member states that staff has been rude to her and have told her she could not be with pt. This is the first conversation between the RN and family member. RN did not have opportunity to explain to family that now that the pt is in a private room he can have 1 visitor. Pt has been informed. Family member continued to yell on the phone stating she would like to talk to a charge nurse. RN unable to transfer from phone. Family member informed she would have to call back. This RN was unable to discuss any further information.

## 2019-11-29 NOTE — ED Notes (Signed)
Pt continues to ask to leave ama .family at bedside wanting pt to stay , echo is being done at bedside at this time

## 2019-11-29 NOTE — ED Notes (Signed)
Pt would like to leave AMA at this time. Dr. Alcario Drought at bedside informed of pts wishes. MD advised pt against this. Discussed POC with AM echocardiogram. Pt content in stay but states that if it "takes too long" he will be leaving in the morning.

## 2019-11-29 NOTE — Progress Notes (Signed)
  Echocardiogram 2D Echocardiogram has been performed.  [name] 11/29/2019, 10:03 AM

## 2019-11-29 NOTE — ED Notes (Signed)
Family member called to speak with Agricultural consultant.  This RN spoke with patient, trying to explain who was in her father's room while she continued to yell in the phone and not letting this RN explain what was going on.  This RN terminated the phone call and family member was given Patient Experience phone number.

## 2019-11-30 LAB — ECHOCARDIOGRAM COMPLETE
Height: 71 in
Weight: 2610.25 oz

## 2019-12-02 DIAGNOSIS — E785 Hyperlipidemia, unspecified: Secondary | ICD-10-CM | POA: Diagnosis not present

## 2019-12-02 DIAGNOSIS — R58 Hemorrhage, not elsewhere classified: Secondary | ICD-10-CM | POA: Diagnosis not present

## 2019-12-02 DIAGNOSIS — N183 Chronic kidney disease, stage 3 unspecified: Secondary | ICD-10-CM | POA: Diagnosis not present

## 2019-12-02 DIAGNOSIS — Z8679 Personal history of other diseases of the circulatory system: Secondary | ICD-10-CM | POA: Diagnosis not present

## 2019-12-02 DIAGNOSIS — Z87891 Personal history of nicotine dependence: Secondary | ICD-10-CM | POA: Diagnosis not present

## 2019-12-02 DIAGNOSIS — I739 Peripheral vascular disease, unspecified: Secondary | ICD-10-CM | POA: Diagnosis not present

## 2019-12-02 DIAGNOSIS — Z6824 Body mass index (BMI) 24.0-24.9, adult: Secondary | ICD-10-CM | POA: Diagnosis not present

## 2019-12-02 DIAGNOSIS — I69351 Hemiplegia and hemiparesis following cerebral infarction affecting right dominant side: Secondary | ICD-10-CM | POA: Diagnosis not present

## 2019-12-02 DIAGNOSIS — I129 Hypertensive chronic kidney disease with stage 1 through stage 4 chronic kidney disease, or unspecified chronic kidney disease: Secondary | ICD-10-CM | POA: Diagnosis not present

## 2019-12-13 DIAGNOSIS — R55 Syncope and collapse: Secondary | ICD-10-CM | POA: Diagnosis not present

## 2019-12-20 ENCOUNTER — Other Ambulatory Visit: Payer: Self-pay

## 2019-12-20 ENCOUNTER — Ambulatory Visit: Payer: Medicare HMO

## 2019-12-20 DIAGNOSIS — R55 Syncope and collapse: Secondary | ICD-10-CM | POA: Diagnosis not present

## 2019-12-20 DIAGNOSIS — R748 Abnormal levels of other serum enzymes: Secondary | ICD-10-CM | POA: Diagnosis not present

## 2019-12-25 ENCOUNTER — Other Ambulatory Visit: Payer: Self-pay | Admitting: Internal Medicine

## 2019-12-27 ENCOUNTER — Other Ambulatory Visit: Payer: Self-pay | Admitting: Internal Medicine

## 2020-01-02 ENCOUNTER — Other Ambulatory Visit: Payer: Self-pay | Admitting: Internal Medicine

## 2020-01-08 ENCOUNTER — Other Ambulatory Visit: Payer: Self-pay

## 2020-01-08 ENCOUNTER — Ambulatory Visit: Payer: Medicare HMO

## 2020-01-08 DIAGNOSIS — I350 Nonrheumatic aortic (valve) stenosis: Secondary | ICD-10-CM

## 2020-01-08 DIAGNOSIS — I351 Nonrheumatic aortic (valve) insufficiency: Secondary | ICD-10-CM | POA: Diagnosis not present

## 2020-01-09 ENCOUNTER — Other Ambulatory Visit: Payer: Self-pay | Admitting: *Deleted

## 2020-01-09 MED ORDER — LOSARTAN POTASSIUM 100 MG PO TABS: 100.0000 mg | ORAL_TABLET | Freq: Every day | ORAL | 1 refills | Status: DC

## 2020-01-09 NOTE — Progress Notes (Signed)
Moderate AS and AI. Normal LVEF. Discuss on OV

## 2020-01-09 NOTE — Telephone Encounter (Signed)
Patient daughter requested refill. Faxed to [facility].

## 2020-01-16 ENCOUNTER — Other Ambulatory Visit: Payer: Self-pay

## 2020-01-16 MED ORDER — EZETIMIBE 10 MG PO TABS: 10.0000 mg | ORAL_TABLET | Freq: Every day | ORAL | 2 refills | Status: DC

## 2020-01-17 ENCOUNTER — Encounter: Payer: Self-pay | Admitting: Cardiology

## 2020-01-17 ENCOUNTER — Other Ambulatory Visit: Payer: Self-pay

## 2020-01-17 ENCOUNTER — Ambulatory Visit: Payer: Medicare HMO | Admitting: Cardiology

## 2020-01-17 VITALS — BP 147/61 | HR 62 | Resp 15 | Ht 71.0 in | Wt 162.1 lb

## 2020-01-17 DIAGNOSIS — I351 Nonrheumatic aortic (valve) insufficiency: Secondary | ICD-10-CM | POA: Diagnosis not present

## 2020-01-17 DIAGNOSIS — E7800 Pure hypercholesterolemia, unspecified: Secondary | ICD-10-CM | POA: Diagnosis not present

## 2020-01-17 DIAGNOSIS — I10 Essential (primary) hypertension: Secondary | ICD-10-CM

## 2020-01-17 DIAGNOSIS — I779 Disorder of arteries and arterioles, unspecified: Secondary | ICD-10-CM

## 2020-01-17 DIAGNOSIS — I350 Nonrheumatic aortic (valve) stenosis: Secondary | ICD-10-CM | POA: Diagnosis not present

## 2020-01-17 DIAGNOSIS — R55 Syncope and collapse: Secondary | ICD-10-CM | POA: Diagnosis not present

## 2020-01-17 MED ORDER — ROSUVASTATIN CALCIUM 20 MG PO TABS: 20.0000 mg | ORAL_TABLET | Freq: Every day | ORAL | 3 refills | Status: DC

## 2020-01-17 MED ORDER — NIFEDIPINE ER 60 MG PO TB24: 60.0000 mg | ORAL_TABLET | Freq: Every day | ORAL | 3 refills | Status: DC

## 2020-01-17 NOTE — Progress Notes (Signed)
Let them know that the remote monitoring did not reveal any significant abnormality

## 2020-01-17 NOTE — Progress Notes (Signed)
Primary Physician/Referring:  Gayland Curry, DO  Patient ID: [name], male    DOB: 06-30-1941, 79 y.o.   MRN: [id number]  Chief Complaint  Patient presents with  . Follow-up    6 month  . Results    Echo  . Loss of Consciousness    HPI: [name]  is a 79 y.o. 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  He was admitted to the hospital on 11/29/2019 with syncope, unwitnessed initial episode, was sitting in the back porch when family member saw him and could not wake him up, EMS was activated and he was brought to the emergency room.  Since being in the emergency room he has been asymptomatic and is back to his baseline.  CT scan of the head did not reveal any abnormality.  Routine labs were within normal limits.  As per patient request he was discharged home with outpatient follow-up.  He underwent remote outpatient cardiac telemetry, echocardiogram and stress test and presents for follow-up.  Fortunately he has not had any recurrence of syncope.  Denies any worsening dyspnea, no chest pain or palpitations.   He is on chronic dual antiplatelet therapy. He is tolerating all his medications well without any side effects.   Past Medical History:  Diagnosis Date  . Anemia   . Cataract    traumatic  . Chronic diastolic heart failure ([place])   . Chronic kidney disease, stage III (moderate)   . Depressive disorder, not elsewhere classified   . Depressive disorder, not elsewhere classified   . Elevated prostate specific antigen (PSA)   . Elevated prostate specific antigen (PSA)   . Gingivitis   . 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  Hyperlipidemia   . Hypertension   . Hypertensive kidney disease, benign   . 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 Microscopic hematuria   . Nondependent cannabis abuse   . Pure hypercholesterolemia   . Unspecified late effects of cerebrovascular disease   . Unspecified late effects of cerebrovascular disease   . Unspecified vitamin D deficiency   . Unspecified vitamin D deficiency     Past Surgical History:  Procedure Laterality Date  . CATARACT EXTRACTION    . CORONARY STENT PLACEMENT     2008  . EYE SURGERY     left eye,    cornea repair  . PERIPHERAL VASCULAR CATHETERIZATION N/A 07/28/2016   Procedure: Lower Extremity Angiography;  Surgeon: Adrian Prows, MD;  Location: [facility];  Service: Cardiovascular;  Laterality: N/A;   Social History   Tobacco Use  . Smoking status: Former Smoker    Packs/day: 0.50    Years: 13.00    Pack years: 6.50    Quit date: 03/09/1971    Years since quitting: 48.8  . Smokeless tobacco: Never Used  Substance Use Topics  . Alcohol use: No    Alcohol/week: 0.0 standard drinks    Marital Status: Widowed   Review of Systems  Constitutional: Negative for chills, decreased appetite, malaise/fatigue and weight gain.  Cardiovascular: Positive for claudication (bilateral calf and also night cramps). Negative for dyspnea on exertion, leg swelling and syncope.  Endocrine: Negative for cold intolerance.  Hematologic/Lymphatic: Does not bruise/bleed easily.  Musculoskeletal: Positive for muscle cramps and muscle weakness (right sided weakness from stroke). Negative for joint swelling.  Gastrointestinal: Negative for abdominal pain, anorexia and change in bowel habit.  Neurological: Positive for focal weakness (right hemiparesis). Negative for headaches and light-headedness.  Psychiatric/Behavioral: Negative for depression and substance abuse.  All other systems reviewed and are  negative.     Objective  Blood pressure (!) 147/61, pulse 62, resp. rate 15, height 5\' 11"  (1.803 m), weight 162 lb 1.6 oz (73.5 kg), SpO2 97 %. Body mass index is 22.61 kg/m.    Physical Exam Constitutional:      General: He is not in acute distress.    Appearance: He is well-developed.  Neck:     Thyroid: No thyromegaly.     Vascular: No JVD.  Cardiovascular:     Rate and Rhythm: Normal rate and regular rhythm.     Pulses:          Carotid pulses are on the right side with bruit and on the left side with bruit.      Femoral pulses are 2+ on the right side and 2+ on the left side.      Popliteal pulses are 1+ on the right side and 2+ on the left side.       Dorsalis pedis pulses are 0 on the right side and 0 on the left side.       Posterior tibial pulses are 0 on the right side and 0 on the left side.     Heart sounds: S1 normal and S2 normal. Murmur heard.  Early systolic murmur is present with a grade of 2/6 at the upper right sternal border radiating to the neck. High-pitched blowing decrescendo early diastolic murmur is present with a grade of 3/4 at the upper right sternal border radiating to the apex.  No gallop.      Comments: Bilateral legs loss of hair. Capillary refill normal. No ulceration. No edema. Pulmonary:     Effort: Pulmonary effort is normal.     Breath sounds: Normal breath sounds.  Abdominal:     General: Bowel sounds are normal.     Palpations: Abdomen is soft.    Radiology: CT scan of the head without contrast: 11/29/2019: No acute intracranial abnormality. Findings consistent with age related atrophy and chronic small vessel ischemia The area of encephalomalacia involving the right frontoparietal lobe  CXR 11/28/2009: There is no evidence of acute infiltrate, pleural effusion or pneumothorax. The cardiac silhouette is mildly enlarged. There is mild calcification of the thoracic aorta. The visualized skeletal structures are  unremarkable.  Laboratory examination:   CMP Latest Ref Rng & Units 11/28/2019 11/15/2019 12/05/2018  Glucose 70 - 99 mg/dL 99 103(H) 105(H)  BUN 8 - 23 mg/dL 45(H) 43(H) 39(H)  Creatinine 0.61 - 1.24 mg/dL 2.07(H) 1.92(H) 1.50(H)  Sodium 135 - 145 mmol/L 140 140 140  Potassium 3.5 - 5.1 mmol/L 4.5 5.0 4.5  Chloride 98 - 111 mmol/L 107 106 106  CO2 22 - 32 mmol/L 22 27 29   Calcium 8.9 - 10.3 mg/dL 9.9 10.0 9.9  Total Protein 6.1 - 8.1 g/dL - - 7.7  Total Bilirubin 0.2 - 1.2 mg/dL - - 0.3  Alkaline Phos 40 - 115 U/L - - -  AST 10 - 35 U/L - - 16  ALT 9 - 46 U/L - - 9   CBC Latest Ref Rng &  Units 11/28/2019 08/24/2019 06/15/2019  WBC 4.0 - 10.5 K/uL 6.6 5.7 5.1  Hemoglobin 13.0 - 17.0 g/dL 11.7(L) 11.8(L) 12.3(L)  Hematocrit 39 - 52 % 38.4(L) 36.2(L) 38.8  Platelets 150 - 400 K/uL 266 266 261   Lipid Panel     Component Value Date/Time   CHOL 153 11/15/2019 0834   CHOL 143 09/20/2015 0855   TRIG 81 11/15/2019 0834   HDL 45 11/15/2019 0834   HDL 48 09/20/2015 0855   CHOLHDL 3.4 11/15/2019 0834   VLDL 13 03/23/2016 0820   LDLCALC 91 11/15/2019 0834   HEMOGLOBIN A1C Lab Results  Component Value Date   HGBA1C 6.2 (H) 12/05/2018   MPG 131 12/05/2018   TSH No results for input(s): TSH in the last 8760 hours.   Allergies:   Allergies  Allergen Reactions  . Penicillins Other (See Comments)    Unknown  Has patient had a PCN reaction causing immediate rash, facial/tongue/throat swelling, SOB or lightheadedness with hypotension: {unknown Has patient had a PCN reaction causing severe rash involving mucus membranes or skin necrosis: unknown Has patient had a PCN reaction that required hospitalization {unknown Has patient had a PCN reaction occurring within the last 10 years: no If all of the above answers are "NO", then may proceed with Cephalosporin use.    Medications:    PRN Meds:. Medications Discontinued During This Encounter  Medication Reason  . pravastatin  (PRAVACHOL) 80 MG tablet Change in therapy  . amLODipine (NORVASC) 5 MG tablet Change in therapy  . NIFEdipine (ADALAT CC) 60 MG 24 hr tablet   . rosuvastatin (CRESTOR) 20 MG tablet    Current Meds  Medication Sig  . aspirin 81 MG tablet Take 81 mg by mouth daily.    Marland Kitchen CALTRATE 600+D 600-800 MG-UNIT TABS TAKE 1 TABLET TWICE DAILY (Patient taking differently: Take 1 tablet by mouth 2 (two) times daily. )  . cilostazol (PLETAL) 100 MG tablet TAKE 1 TABLET TWICE DAILY (Patient taking differently: Take 100 mg by mouth 2 (two) times daily. )  . ezetimibe (ZETIA) 10 MG tablet Take 1 tablet (10 mg total) by mouth daily.  . furosemide (LASIX) 20 MG tablet TAKE ONE TABLET BY MOUTH EVERY MORNING AND TAKE ONE TABLET BY MOUTH EVERY EVENING  . latanoprost (XALATAN) 0.005 % ophthalmic solution Place 1 drop into both eyes at bedtime.   Marland Kitchen losartan (COZAAR) 100 MG tablet Take 1 tablet (100 mg total) by mouth daily.  . timolol (BETIMOL) 0.5 % ophthalmic solution Place 1 drop into both eyes every morning.   . [DISCONTINUED] amLODipine (NORVASC) 5 MG tablet TAKE 1 TABLET EVERY DAY  . [DISCONTINUED] pravastatin (PRAVACHOL) 80 MG tablet TAKE 1 TABLET EVERY DAY FOR CHOLESTEROL    Cardiac Studies:   Lower Extremity Doppler [06/16/2016]:  No hemodynamically significant stenoses are identified in the right lower extremity arterial system. There is severe diffuse mixed plaque throughout. Moderate velocity increase at the left mid superficial femoral artery suggesting >50% stenosis. There is severe diffuse mixed plaque througout. This exam reveals severely decreased perfusion of the right lower extremity with RABI 0.46 and moderately decreased perfusion of the left lower extremity with LABI 0.69, noted at the post tibial artery level.  Peripheral arteriogram 07/28/2016: Moderate diffuse SFA disease. Right below knee severe diffuse disease with PT run-off. Left PT and Peroneal run-off, diffuse disease  Carotid  Doppler  08/12/2017: Mild stenosis in the right internal carotid artery (1-15%). Minimal stenosis in the right common carotid artery (<50%).  Antegrade right vertebral artery flow. Bidirectional left vertebral artery flow. May suggest proximal left subclavian stenosis. Compared to the study done on 08/12/2016, right ICA stenosis of less than 50% no longer present. Further studies when clinically indicated.  Remote outpatient cardiac telemetry 11/29/2019 for 1 week: 08/31/2019 through 12/04/2019: Predominant rhythm is normal sinus rhythm.  Minimum heart rate 48, maximum heart rate 131 and average heart rate 67 bpm. Ventricular ectopy 309 22 with 11 ventricular pairs and 0 ventricular tachycardia runs. 13,034 PACs.  5 supraventricular runs, longest 13 beats.  No atrial fibrillation.  There were 0 patient activated events.  Echocardiogram 01/08/2020: Left ventricle cavity is normal in size. Moderate concentric hypertrophy  of the left ventricle. Normal global wall motion. Normal LV systolic  function with EF 61%. Doppler evidence of grade I (impaired) diastolic  dysfunction, normal LAP.  Trileaflet aortic valve. Moderate calcification. Moderate aortic stenosis.  Aortic valve mean gradient of 35 mmHg, Vmax of 4.2 m/s. Calculated aortic  valve area by continuity equation is 1.7 cm. Moderate (Grade III) aortic  regurgitation.  Mild tricuspid regurgitation. Estimated pulmonary artery systolic pressure  is 23 mmHg.  Compared to previous study in 2018, there is progression of aortic  stenosis severity (MG up from 17 to 35 mmHg).  Lexiscan Tetrofosmin stress test 12/20/2019: No previous exam available for comparison. Lexiscan nuclear stress test performed using 1-day protocol. Stress EKG is non-diagnostic, as this is pharmacological stress test. Rest and stress EKG shows sinus rhythm, RBBB, T wave inversion inferolateral leads, occasional PAC. SPECT images show small sized, mild intensity, mildly reversible  perfusion defect in mid to basal inferior myocardium. Stress LVEF calculated 44%, although visually appears normal. No regional abnormalities of myocardial thickening noted. Low risk study.   EKG 01/17/2020: Normal sinus rhythm with rate of 63 bpm, left atrial abnormality, left axis deviation, left anterior fascicular block.  Right bundle branch block.  Bifascicular block.  LVH with repolarization abnormality, cannot exclude anterolateral ischemia, inferior ischemia.  PACs, no significant change from 11/08/2017.  Assessment     ICD-10-CM   1. Syncope and collapse  R55   2. Essential hypertension  I10 EKG 12-Lead    NIFEdipine (ADALAT CC) 60 MG 24 hr tablet    DISCONTINUED: NIFEdipine (ADALAT CC) 60 MG 24 hr tablet  3. Moderate aortic stenosis  I35.0   4. Moderate aortic regurgitation  I35.1 NIFEdipine (ADALAT CC) 60 MG 24 hr tablet    DISCONTINUED: NIFEdipine (ADALAT CC) 60 MG 24 hr tablet  5. PAOD (peripheral arterial occlusive disease) (H[facility])  I77.9   6. Hypercholesteremia  E78.00 Lipid Panel With LDL/HDL Ratio    rosuvastatin (CRESTOR) 20 MG tablet    DISCONTINUED: rosuvastatin (CRESTOR) 20 MG tablet    EKG 11/08/2017: Normal sinus rhythm at rate of 59 bpm, left atrial enlargement, left axis deviation, left anterior fascicular block. Right bundle branch block. LVH with repolarization abnormality, cannot exclude inferior and lateral ischemia. PAC.  Recommendations:   Jamare "Warner Mccreedy"  Mukai is a 79 y.o. 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  He was admitted to the hospital on 11/29/2019 with syncope, felt to be cardiogenic.  Since emergency room visit with syncope, he has not had any further episodes of syncope or  near syncope.  Cardiac event  reveals no significant arrhythmias or heart block.  His caregiver, stepdaughter states that when he came home he was little unstable on his gait and also slowing of speech.  Suspect he may have had small vessel CVA.  He has moderate aortic stenosis and is slight progression compared to 2020, However he remains asymptomatic continue observation for now, I stenotic gradients may also have increased due to aortic regurgitation as well.  No change in his physical exam from previous a year ago.  I will discontinue amlodipine and switch him to Procardia XL 60 mg daily in view of aortic regurgitation.  This should help with blood pressure control as well.  I reviewed his labs, LDL is not at goal in view of peripheral arterial disease and also in view of abnormal nuclear stress test.  I would like to continue medical therapy without cardiac catheterization.  Will discontinue pravastatin and switch him to Crestor 20 mg daily, he will continue with Zetia.  I will obtain lipid profile testing in 2 months.  I will see him back in 3 months.  Blood pressure is well controlled, lipids are also well controlled.   No clinical evidence of critical limb ischemia, he has good capillary refill, no skin breakdown.  Continue medical therapy.  I'll see him back in 6 months. I will repeat echocardiogram prior to his next office visit in 6 months.  Adrian Prows, MD, [facility] 01/17/2020, 12:09 PM [facility] Pager: [phone] Office: [phone]

## 2020-01-18 NOTE — Progress Notes (Signed)
Called and spoke with patient regarding his monitor results.

## 2020-01-22 DIAGNOSIS — I110 Hypertensive heart disease with heart failure: Secondary | ICD-10-CM | POA: Diagnosis not present

## 2020-01-22 DIAGNOSIS — E261 Secondary hyperaldosteronism: Secondary | ICD-10-CM | POA: Diagnosis not present

## 2020-01-22 DIAGNOSIS — I509 Heart failure, unspecified: Secondary | ICD-10-CM | POA: Diagnosis not present

## 2020-02-01 ENCOUNTER — Telehealth: Payer: Self-pay

## 2020-02-01 NOTE — Telephone Encounter (Signed)
Amlodipine is fine to restart 5 mg daily

## 2020-02-01 NOTE — Telephone Encounter (Signed)
Please read whole message:  Patient daughter called stating that NIFEDIPINE is not covered by insurance and wants to know if you want patient can go back on Amlodipine or maybe start a new BP medication that may be covered by patients insurance. Please advise.   Jonelle Sidle (patient daughter) [phone]

## 2020-02-02 ENCOUNTER — Other Ambulatory Visit: Payer: Self-pay

## 2020-02-02 MED ORDER — AMLODIPINE BESYLATE 5 MG PO TABS: 5.0000 mg | ORAL_TABLET | Freq: Every day | ORAL | 1 refills | Status: DC

## 2020-02-02 NOTE — Telephone Encounter (Signed)
Rx has been sent in. 

## 2020-02-07 ENCOUNTER — Other Ambulatory Visit: Payer: Self-pay | Admitting: Cardiology

## 2020-03-02 ENCOUNTER — Ambulatory Visit: Payer: Medicare HMO | Attending: Internal Medicine

## 2020-03-02 ENCOUNTER — Ambulatory Visit: Payer: Self-pay

## 2020-03-02 DIAGNOSIS — Z23 Encounter for immunization: Secondary | ICD-10-CM

## 2020-03-02 NOTE — Progress Notes (Signed)
   Covid-19 Vaccination Clinic  Name:  [name]    MRN: [id number] DOB: February 03, 1941  03/02/2020  [name] was observed post Covid-19 immunization for 15 minutes without incident. He was provided with Vaccine Information Sheet and instruction to access the V-Safe system.   [name] was instructed to call 911 with any severe reactions post vaccine: Marland Kitchen Difficulty breathing  . Swelling of face and throat  . A fast heartbeat  . A bad rash all over body  . Dizziness and weakness   Immunizations Administered    Name Date Dose VIS Date Route   Pfizer COVID-19 Vaccine 03/02/2020 10:48 AM 0.3 mL 09/27/2018 Intramuscular   Manufacturer: [employer], [employer]   Lot: [id number]   [place]: [id number]

## 2020-03-21 ENCOUNTER — Ambulatory Visit (INDEPENDENT_AMBULATORY_CARE_PROVIDER_SITE_OTHER): Payer: Medicare HMO | Admitting: Internal Medicine

## 2020-03-21 ENCOUNTER — Other Ambulatory Visit: Payer: Self-pay

## 2020-03-21 ENCOUNTER — Encounter: Payer: Self-pay | Admitting: Internal Medicine

## 2020-03-21 VITALS — BP 162/82 | HR 69 | Temp 97.1°F | Ht 71.0 in | Wt 158.6 lb

## 2020-03-21 DIAGNOSIS — N1831 Chronic kidney disease, stage 3a: Secondary | ICD-10-CM | POA: Diagnosis not present

## 2020-03-21 DIAGNOSIS — G3184 Mild cognitive impairment, so stated: Secondary | ICD-10-CM | POA: Diagnosis not present

## 2020-03-21 DIAGNOSIS — R233 Spontaneous ecchymoses: Secondary | ICD-10-CM

## 2020-03-21 DIAGNOSIS — I10 Essential (primary) hypertension: Secondary | ICD-10-CM | POA: Diagnosis not present

## 2020-03-21 DIAGNOSIS — R739 Hyperglycemia, unspecified: Secondary | ICD-10-CM

## 2020-03-21 DIAGNOSIS — E7800 Pure hypercholesterolemia, unspecified: Secondary | ICD-10-CM

## 2020-03-21 DIAGNOSIS — R238 Other skin changes: Secondary | ICD-10-CM

## 2020-03-21 DIAGNOSIS — Z1159 Encounter for screening for other viral diseases: Secondary | ICD-10-CM | POA: Diagnosis not present

## 2020-03-21 NOTE — Progress Notes (Signed)
Location:  [facility] clinic Provider:  Dynasia Kercheval L. Mariea Clonts, D.O., C.M.D.  Code Status: full code Goals of Care:  Advanced Directives 03/21/2020  Does Patient Have a Medical Advance Directive? No  Would patient like information on creating a medical advance directive? Yes (ED - Information included in AVS)     Chief Complaint  Patient presents with  . Medical Management of Chronic Issues    4 month follow up  . Health Maintenance    Hep C, Influenza hight dose is not in yet , Covid he has had the first dose    HPI: Patient is a 79 y.o. male seen today for medical management of chronic diseases.    He got his first covid vaccine and second is 8/31.   No pain complaints.    BP up a little this morning.  He's not had pineapple in a couple of days and insists that if he eats it, his bp will be fine.  We discussed resuming home checks and Jonelle Sidle is ok with this plan.     No more syncope since April.    He's not had any new bruises.  No falls.    Past Medical History:  Diagnosis Date  . Anemia   . Cataract    traumatic  . Chronic diastolic heart failure ([place])   . Chronic kidney disease, stage III (moderate)   . Depressive disorder, not elsewhere classified   . Depressive disorder, not elsewhere classified   . Elevated prostate specific antigen (PSA)   . Elevated prostate specific antigen (PSA)   . Gingivitis   . 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 Hyperlipidemia   . Hypertension   . Hypertensive kidney disease, benign   . 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 Microscopic hematuria   . Nondependent cannabis abuse   . Pure hypercholesterolemia   . Unspecified late effects of cerebrovascular disease   . Unspecified late effects of cerebrovascular disease   . Unspecified vitamin D deficiency   . Unspecified vitamin D deficiency     Past Surgical History:   Procedure Laterality Date  . CATARACT EXTRACTION    . CORONARY STENT PLACEMENT     2008  . EYE SURGERY     left eye,    cornea repair  . PERIPHERAL VASCULAR CATHETERIZATION N/A 07/28/2016   Procedure: Lower Extremity Angiography;  Surgeon: Adrian Prows, MD;  Location: [place] [facility];  Service: Cardiovascular;  Laterality: N/A;    Allergies  Allergen Reactions  . Penicillins Other (See Comments)    Unknown  Has patient had a PCN reaction causing immediate rash, facial/tongue/throat swelling, SOB or lightheadedness with hypotension: {unknown Has patient had a PCN reaction causing severe rash involving mucus membranes or skin necrosis: unknown Has patient had a PCN reaction that required hospitalization {unknown Has patient had a PCN reaction occurring within the last 10 years: no If all of the above answers are "NO", then may proceed with Cephalosporin use.    Outpatient Encounter Medications as of 03/21/2020  Medication Sig  . amLODipine (NORVASC) 5 MG tablet Take 1 tablet (5 mg total) by mouth daily.  Marland Kitchen aspirin 81 MG tablet Take 81 mg by mouth daily.    . Calcium Carbonate-Vit D-Min (CALTRATE 600+D PLUS PO) Take by mouth. 1 tablet 2 times a day  . cilostazol (PLETAL) 100 MG tablet TAKE 1 TABLET TWICE DAILY  .  ezetimibe (ZETIA) 10 MG tablet Take 1 tablet (10 mg total) by mouth daily.  . furosemide (LASIX) 20 MG tablet TAKE ONE TABLET BY MOUTH EVERY MORNING AND TAKE ONE TABLET BY MOUTH EVERY EVENING  . latanoprost (XALATAN) 0.005 % ophthalmic solution Place 1 drop into both eyes at bedtime.   Marland Kitchen losartan (COZAAR) 100 MG tablet Take 1 tablet (100 mg total) by mouth daily.  Marland Kitchen NIFEdipine (ADALAT CC) 60 MG 24 hr tablet Take 1 tablet (60 mg total) by mouth daily.  . rosuvastatin (CRESTOR) 20 MG tablet Take 1 tablet (20 mg total) by mouth daily.  . timolol (BETIMOL) 0.5 % ophthalmic solution Place 1 drop into both eyes every morning.   . [DISCONTINUED] CALTRATE 600+D 600-800 MG-UNIT TABS  TAKE 1 TABLET TWICE DAILY (Patient taking differently: Take 1 tablet by mouth 2 (two) times daily. )   No facility-administered encounter medications on file as of 03/21/2020.    Review of Systems:  Review of Systems  Constitutional: Negative for chills, fever and malaise/fatigue.  HENT: Negative for congestion.   Eyes: Negative for blurred vision.  Respiratory: Negative for cough and shortness of breath.   Cardiovascular: Negative for chest pain, palpitations and leg swelling.  Gastrointestinal: Negative for abdominal pain, blood in stool, constipation, diarrhea and melena.  Genitourinary: Negative for dysuria.  Musculoskeletal: Negative for back pain, falls and joint pain.  Neurological: Negative for dizziness and headaches.       No further syncope since april  Psychiatric/Behavioral: Positive for memory loss. Negative for depression. The patient is not nervous/anxious and does not have insomnia.     Health Maintenance  Topic Date Due  . Hepatitis C Screening  Never done  . COVID-19 Vaccine (2 - Pfizer 2-dose series) 03/23/2020  . INFLUENZA VACCINE  06/21/2020 (Originally 03/03/2020)  . TETANUS/TDAP  04/24/2029  . PNA vac Low Risk Adult  Completed    Physical Exam: Vitals:   03/21/20 1016  BP: (!) 158/72  Pulse: 69  Temp: (!) 97.1 F (36.2 C)  SpO2: 97%  Weight: 158 lb 9.6 oz (71.9 kg)  Height: 5\' 11"  (1.803 m)   Body mass index is 22.12 kg/m. Physical Exam Vitals reviewed.  Constitutional:      Appearance: Normal appearance.  HENT:     Head: Normocephalic and atraumatic.  Eyes:     Extraocular Movements: Extraocular movements intact.     Pupils: Pupils are equal, round, and reactive to light.  Cardiovascular:     Rate and Rhythm: Normal rate and regular rhythm.     Heart sounds: Murmur heard.      Comments: Loud blowing systolic murmur throughout precordium Pulmonary:     Effort: Pulmonary effort is normal.     Breath sounds: Normal breath sounds. No rhonchi  or rales.  Abdominal:     General: Bowel sounds are normal. There is no distension.     Palpations: Abdomen is soft.     Tenderness: There is no abdominal tenderness.  Musculoskeletal:        General: Normal range of motion.     Cervical back: Neck supple.     Right lower leg: No edema.     Left lower leg: No edema.  Neurological:     General: No focal deficit present.     Mental Status: He is alert and oriented to person, place, and time.     Motor: No weakness.     Comments: Short-term memory poor  Psychiatric:  Mood and Affect: Mood normal.     Labs reviewed: Basic Metabolic Panel: Recent Labs    11/15/19 0834 11/28/19 1546  NA 140 140  K 5.0 4.5  CL 106 107  CO2 27 22  GLUCOSE 103* 99  BUN 43* 45*  CREATININE 1.92* 2.07*  CALCIUM 10.0 9.9   Liver Function Tests: No results for input(s): AST, ALT, ALKPHOS, BILITOT, PROT, ALBUMIN in the last 8760 hours. No results for input(s): LIPASE, AMYLASE in the last 8760 hours. No results for input(s): AMMONIA in the last 8760 hours. CBC: Recent Labs    06/15/19 1520 08/24/19 1215 11/28/19 1546  WBC 5.1 5.7 6.6  NEUTROABS 2,535 3,426  --   HGB 12.3* 11.8* 11.7*  HCT 38.8 36.2* 38.4*  MCV 88.6 88.9 92.8  PLT 261 266 266   Lipid Panel: Recent Labs    11/15/19 0834  CHOL 153  HDL 45  LDLCALC 91  TRIG 81  CHOLHDL 3.4   Lab Results  Component Value Date   HGBA1C 6.2 (H) 12/05/2018     Assessment/Plan 1. Mild cognitive impairment with memory loss -ongoing, no longer driving due to syncope  2. Essential hypertension -bp elevated again today and reportedly he's taken his meds as directed -resume bid bp checks and Erla Bacchi to call if readings are over 140/90   3. Hyperglycemia -encouraged exercise and avoiding sweets and too many starchy or fried foods Lab Results  Component Value Date   HGBA1C 6.2 (H) 12/05/2018    4. Stage 3a chronic kidney disease -Avoid nephrotoxic agents like nsaids, dose  adjust renally excreted meds, hydrate.  5. Pure hypercholesterolemia -last still elevated in April and didn't tolerate statins, on just zetia and "diet" Lab Results  Component Value Date   [place] 91 11/15/2019    6. Abnormal bruising -no further bouts, had eval at hematology with all potential causes discussed with him, but no major reversible ones or anything that needed more treatment  7. Encounter for hepatitis C screening test for low risk patient - Hepatitis C antibody; Future   Labs/tests ordered:  Lab Orders     Hepatitis C antibody Will do this and other labs same day as next appt  Next appt:  07/22/2020   Kamoni Depree L. Malachy Coleman, D.O. [facility] [address], [place] [address] Cell Phone (Mon-Fri 8am-5pm):  [phone] On Call:  [phone] & follow prompts after 5pm & weekends Office Phone:  [phone] Office Fax:  [phone]

## 2020-03-22 DIAGNOSIS — E7800 Pure hypercholesterolemia, unspecified: Secondary | ICD-10-CM | POA: Diagnosis not present

## 2020-03-23 LAB — LIPID PANEL WITH LDL/HDL RATIO
Cholesterol, Total: 153 mg/dL (ref 100–199)
HDL: 50 mg/dL (ref >39)
LDL Chol Calc (NIH): 90 mg/dL (ref 0–99)
LDL/HDL Ratio: 1.8 ratio (ref 0.0–3.6)
Triglycerides: 64 mg/dL (ref 0–149)
VLDL Cholesterol Cal: 13 mg/dL (ref 5–40)

## 2020-04-02 ENCOUNTER — Ambulatory Visit: Payer: Medicare HMO | Attending: Critical Care Medicine

## 2020-04-02 DIAGNOSIS — Z23 Encounter for immunization: Secondary | ICD-10-CM

## 2020-04-02 NOTE — Progress Notes (Signed)
   Covid-19 Vaccination Clinic  Name:  DRESHON PROFFIT    MRN: [id number] DOB: Jun 21, 1941  04/02/2020  Mr. Cumbie was observed post Covid-19 immunization for 15 minutes without incident. He was provided with Vaccine Information Sheet and instruction to access the V-Safe system.   Mr. Ewen was instructed to call 911 with any severe reactions post vaccine: Marland Kitchen Difficulty breathing  . Swelling of face and throat  . A fast heartbeat  . A bad rash all over body  . Dizziness and weakness   Immunizations Administered    Name Date Dose VIS Date Route   Pfizer COVID-19 Vaccine 04/02/2020 10:34 AM 0.3 mL 09/27/2018 Intramuscular   Manufacturer: [employer]   Lot: [id number]   [place]: [id number]

## 2020-04-10 DIAGNOSIS — H401123 Primary open-angle glaucoma, left eye, severe stage: Secondary | ICD-10-CM | POA: Diagnosis not present

## 2020-04-10 DIAGNOSIS — H401112 Primary open-angle glaucoma, right eye, moderate stage: Secondary | ICD-10-CM | POA: Diagnosis not present

## 2020-04-10 DIAGNOSIS — H2511 Age-related nuclear cataract, right eye: Secondary | ICD-10-CM | POA: Diagnosis not present

## 2020-04-10 DIAGNOSIS — Z9889 Other specified postprocedural states: Secondary | ICD-10-CM | POA: Diagnosis not present

## 2020-04-10 DIAGNOSIS — Z961 Presence of intraocular lens: Secondary | ICD-10-CM | POA: Diagnosis not present

## 2020-04-22 ENCOUNTER — Encounter: Payer: Self-pay | Admitting: Cardiology

## 2020-04-22 ENCOUNTER — Ambulatory Visit: Payer: Medicare HMO | Admitting: Cardiology

## 2020-04-22 ENCOUNTER — Other Ambulatory Visit: Payer: Self-pay

## 2020-04-22 ENCOUNTER — Telehealth: Payer: Self-pay

## 2020-04-22 VITALS — BP 136/56 | HR 65 | Resp 16 | Ht 71.0 in | Wt 159.0 lb

## 2020-04-22 DIAGNOSIS — I10 Essential (primary) hypertension: Secondary | ICD-10-CM

## 2020-04-22 DIAGNOSIS — I350 Nonrheumatic aortic (valve) stenosis: Secondary | ICD-10-CM

## 2020-04-22 DIAGNOSIS — I351 Nonrheumatic aortic (valve) insufficiency: Secondary | ICD-10-CM | POA: Diagnosis not present

## 2020-04-22 DIAGNOSIS — E7800 Pure hypercholesterolemia, unspecified: Secondary | ICD-10-CM | POA: Diagnosis not present

## 2020-04-22 MED ORDER — ROSUVASTATIN CALCIUM 40 MG PO TABS: 40.0000 mg | ORAL_TABLET | Freq: Every day | ORAL | 3 refills | Status: DC

## 2020-04-22 NOTE — Progress Notes (Signed)
Primary Physician/Referring:  Gayland Curry, DO  Patient ID: [name], male    DOB: 1941-02-22, 79 y.o.   MRN: [id number]  Chief Complaint  Patient presents with  . Hypertension  . Hyperlipidemia  . Cardiac Valve Problem    3 month f/u    HPI: [name]  is a 79 y.o. 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  The patient presents for 3 month follow up. At his last visit he was started on Crestor in addition to Zetia for hyperlipidemia and switched from amlodipine to Procardia. He is tolerating Crestor well. His insurance did not cover Procardia XL so he has been changed back to amlodipine. He denies any new complaints. Denies worsening dyspnea or chest pain.  Denies worsening claudication. Denies dizziness, syncopal episodes.  Past Medical History:  Diagnosis Date  . Anemia   . Cataract    traumatic  . Chronic diastolic heart failure ([place])   . Chronic kidney disease, stage III (moderate)   . Depressive disorder, not elsewhere classified   . Depressive disorder, not elsewhere classified   . Elevated prostate specific antigen (PSA)   . Elevated prostate specific antigen (PSA)   . Gingivitis   . 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 Hyperlipidemia   . Hypertension   . Hypertensive kidney disease, benign   . 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 Microscopic hematuria   . Nondependent cannabis abuse   . Pure hypercholesterolemia   . Unspecified late effects of cerebrovascular disease   . Unspecified late effects of cerebrovascular disease   . Unspecified vitamin D deficiency    . Unspecified vitamin D deficiency     Past Surgical History:  Procedure Laterality Date  . CATARACT EXTRACTION    . CORONARY STENT PLACEMENT     2008  . EYE SURGERY     left eye,    cornea repair  . PERIPHERAL VASCULAR CATHETERIZATION N/A 07/28/2016   Procedure: Lower Extremity Angiography;  Surgeon: Adrian Prows, MD;  Location: [place] [facility];  Service: Cardiovascular;  Laterality: N/A;   Social History   Tobacco Use  . Smoking status: Former Smoker    Packs/day: 0.50    Years: 13.00    Pack years: 6.50    Types: Cigarettes    Quit date: 03/09/1971    Years since quitting: 49.1  . Smokeless tobacco: Never Used  Substance Use Topics  . Alcohol use: No    Alcohol/week: 0.0 standard drinks    Marital Status: Widowed   Review of Systems  Cardiovascular: Positive for claudication (bilateral calf and also night cramps). Negative for dyspnea on exertion.  Musculoskeletal: Positive for muscle cramps and muscle weakness (right sided weakness from stroke). Negative for joint swelling.  Neurological: Positive for focal weakness (right hemiparesis).  Psychiatric/Behavioral: Negative for depression and substance abuse.  All other systems reviewed and are negative.  Objective  Blood pressure (!) 136/56, pulse 65, resp. rate 16, height 5\' 11"  (1.803 m), weight 159 lb (72.1 kg), SpO2 98 %. Body mass index is 22.18 kg/m.  Vitals with BMI 04/22/2020 03/21/2020 03/21/2020  Height 5\' 11"  - 5\' 11"   Weight 159  lbs - 158 lbs 10 oz  BMI 93.23 - 55.73  Systolic 220 254 270  Diastolic 56 82 72  Pulse 65 - 69       Physical Exam Constitutional:      General: He is not in acute distress.    Appearance: He is well-developed.  Neck:     Thyroid: No thyromegaly.     Vascular: No JVD.  Cardiovascular:     Rate and Rhythm: Normal rate and regular rhythm.     Pulses:          Carotid pulses are on the right side with bruit and on the left side with bruit.      Femoral pulses are 2+ on the  right side and 2+ on the left side.      Popliteal pulses are 1+ on the right side and 2+ on the left side.       Dorsalis pedis pulses are 0 on the right side and 0 on the left side.       Posterior tibial pulses are 0 on the right side and 0 on the left side.     Heart sounds: S1 normal and S2 normal. Murmur heard.  Early systolic murmur is present with a grade of 2/6 at the upper right sternal border radiating to the neck. High-pitched blowing decrescendo early diastolic murmur is present with a grade of 3/4 at the upper right sternal border radiating to the apex.  No gallop.      Comments: Bilateral legs loss of hair. Capillary refill normal. No ulceration. No edema. Pulmonary:     Effort: Pulmonary effort is normal.     Breath sounds: Normal breath sounds.  Abdominal:     General: Bowel sounds are normal.     Palpations: Abdomen is soft.    Radiology: CT scan of the head without contrast: 11/29/2019: No acute intracranial abnormality. Findings consistent with age related atrophy and chronic small vessel ischemia The area of encephalomalacia involving the right frontoparietal lobe  CXR 11/28/2009: There is no evidence of acute infiltrate, pleural effusion or pneumothorax. The cardiac silhouette is mildly enlarged. There is mild calcification of the thoracic aorta. The visualized skeletal structures are unremarkable.  Laboratory examination:   CMP Latest Ref Rng & Units 11/28/2019 11/15/2019 12/05/2018  Glucose 70 - 99 mg/dL 99 103(H) 105(H)  BUN 8 - 23 mg/dL 45(H) 43(H) 39(H)  Creatinine 0.61 - 1.24 mg/dL 2.07(H) 1.92(H) 1.50(H)  Sodium 135 - 145 mmol/L 140 140 140  Potassium 3.5 - 5.1 mmol/L 4.5 5.0 4.5  Chloride 98 - 111 mmol/L 107 106 106  CO2 22 - 32 mmol/L 22 27 29   Calcium 8.9 - 10.3 mg/dL 9.9 10.0 9.9  Total Protein 6.1 - 8.1 g/dL - - 7.7  Total Bilirubin 0.2 - 1.2 mg/dL - - 0.3  Alkaline Phos 40 - 115 U/L - - -  AST 10 - 35 U/L - - 16  ALT 9 - 46 U/L - - 9   CBC  Latest Ref Rng & Units 11/28/2019 08/24/2019 06/15/2019  WBC 4.0 - 10.5 K/uL 6.6 5.7 5.1  Hemoglobin 13.0 - 17.0 g/dL 11.7(L) 11.8(L) 12.3(L)  Hematocrit 39 - 52 % 38.4(L) 36.2(L) 38.8  Platelets 150 - 400 K/uL 266 266 261   Lipid Panel  Recent Labs    11/15/19 0834 03/22/20 0934  CHOL 153 153  TRIG 81 64  LDLCALC 91 90  HDL 45 50  CHOLHDL 3.4  --  HEMOGLOBIN A1C Lab Results  Component Value Date   HGBA1C 6.2 (H) 12/05/2018   MPG 131 12/05/2018   TSH No results for input(s): TSH in the last 8760 hours.   Allergies:   Allergies  Allergen Reactions  . Penicillins Other (See Comments)    Unknown  Has patient had a PCN reaction causing immediate rash, facial/tongue/throat swelling, SOB or lightheadedness with hypotension: {unknown Has patient had a PCN reaction causing severe rash involving mucus membranes or skin necrosis: unknown Has patient had a PCN reaction that required hospitalization {unknown Has patient had a PCN reaction occurring within the last 10 years: no If all of the above answers are "NO", then may proceed with Cephalosporin use.    Medications:    PRN Meds:. Medications Discontinued During This Encounter  Medication Reason  . rosuvastatin (CRESTOR) 20 MG tablet Reorder   Current Outpatient Medications on File Prior to Visit  Medication Sig Dispense Refill  . amLODipine (NORVASC) 5 MG tablet Take 1 tablet (5 mg total) by mouth daily. 90 tablet 1  . aspirin 81 MG tablet Take 81 mg by mouth daily.      . Calcium Carbonate-Vit D-Min (CALTRATE 600+D PLUS PO) Take by mouth. 1 tablet 2 times a day    . cilostazol (PLETAL) 100 MG tablet TAKE 1 TABLET TWICE DAILY 180 tablet 2  . ezetimibe (ZETIA) 10 MG tablet Take 1 tablet (10 mg total) by mouth daily. 90 tablet 2  . furosemide (LASIX) 20 MG tablet TAKE ONE TABLET BY MOUTH EVERY MORNING AND TAKE ONE TABLET BY MOUTH EVERY EVENING 180 tablet 1  . latanoprost (XALATAN) 0.005 % ophthalmic solution Place 1 drop  into both eyes at bedtime.     Marland Kitchen losartan (COZAAR) 100 MG tablet Take 1 tablet (100 mg total) by mouth daily. 90 tablet 1  . timolol (BETIMOL) 0.5 % ophthalmic solution Place 1 drop into both eyes every morning.     Marland Kitchen NIFEdipine (ADALAT CC) 60 MG 24 hr tablet Take 1 tablet (60 mg total) by mouth daily. (Patient not taking: Reported on 04/22/2020) 90 tablet 3   No current facility-administered medications on file prior to visit.     Cardiac Studies:   Lower Extremity Doppler [06/16/2016]:  No hemodynamically significant stenoses are identified in the right lower extremity arterial system. There is severe diffuse mixed plaque throughout. Moderate velocity increase at the left mid superficial femoral artery suggesting >50% stenosis. There is severe diffuse mixed plaque througout. This exam reveals severely decreased perfusion of the right lower extremity with RABI 0.46 and moderately decreased perfusion of the left lower extremity with LABI 0.69, noted at the post tibial artery level.  Peripheral arteriogram 07/28/2016: Moderate diffuse SFA disease. Right below knee severe diffuse disease with PT run-off. Left PT and Peroneal run-off, diffuse disease  Carotid Doppler  08/12/2017: Mild stenosis in the right internal carotid artery (1-15%). Minimal stenosis in the right common carotid artery (<50%). Antegrade right vertebral artery flow. Bidirectional left vertebral artery flow. May suggest proximal left subclavian stenosis. Compared to the study done on 08/12/2016, right ICA stenosis of less than 50% no longer present. Further studies when clinically indicated.  Remote outpatient cardiac telemetry 11/29/2019 for 1 week: 08/31/2019 through 12/04/2019: Predominant rhythm is normal sinus rhythm.  Minimum heart rate 48, maximum heart rate 131 and average heart rate 67 bpm. Ventricular ectopy 309 22 with 11 ventricular pairs and 0 ventricular tachycardia runs. 13,034 PACs.  5 supraventricular runs,  longest 13 beats.  No atrial fibrillation.  There were 0 patient activated events.  Echocardiogram 01/08/2020: Left ventricle cavity is normal in size. Moderate concentric hypertrophy  of the left ventricle. Normal global wall motion. Normal LV systolic  function with EF 61%. Doppler evidence of grade I (impaired) diastolic  dysfunction, normal LAP.  Trileaflet aortic valve. Moderate calcification. Moderate aortic stenosis.  Aortic valve mean gradient of 35 mmHg, Vmax of 4.2 m/s. Calculated aortic  valve area by continuity equation is 1.7 cm. Moderate (Grade III) aortic  regurgitation.  Mild tricuspid regurgitation. Estimated pulmonary artery systolic pressure  is 23 mmHg.  Compared to previous study in 2018, there is progression of aortic  stenosis severity (MG up from 17 to 35 mmHg).  Lexiscan Tetrofosmin stress test 12/20/2019: No previous exam available for comparison. Lexiscan nuclear stress test performed using 1-day protocol. Stress EKG is non-diagnostic, as this is pharmacological stress test. Rest and stress EKG shows sinus rhythm, RBBB, T wave inversion inferolateral leads, occasional PAC. SPECT images show small sized, mild intensity, mildly reversible perfusion defect in mid to basal inferior myocardium. Stress LVEF calculated 44%, although visually appears normal. No regional abnormalities of myocardial thickening noted. Low risk study.   EKG:  EKG 01/17/2020: Normal sinus rhythm with rate of 63 bpm, left atrial abnormality, left axis deviation, left anterior fascicular block.  Right bundle branch block.  Bifascicular block.  LVH with repolarization abnormality, cannot exclude anterolateral ischemia, inferior ischemia.  PACs, no significant change from 11/08/2017.  Assessment     ICD-10-CM   1. Hypercholesteremia  E78.00 rosuvastatin (CRESTOR) 40 MG tablet  2. Essential hypertension  I10   3. Moderate aortic regurgitation  I35.1   4. Moderate aortic stenosis  I35.0       Medications Discontinued During This Encounter  Medication Reason  . rosuvastatin (CRESTOR) 20 MG tablet Reorder    Meds ordered this encounter  Medications  . rosuvastatin (CRESTOR) 40 MG tablet    Sig: Take 1 tablet (40 mg total) by mouth daily.    Dispense:  90 tablet    Refill:  3    Discontinue Pravastatin    Recommendations:   David "Warner Mccreedy"  [name] is a 79 y.o. 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  The patient presents today for 3 month follow up for hypertension and hyperlipidemia. Overall he is feeling well and has no specific complaints. At his last visit he was switched to procardia XL from amlodipine in view of aortic regurgitation, however his insurance would not cover the new medication so he remained on amlodipine. His blood pressure is controlled. No changes are needed to his current anti-hypertensive medication regimen. He is encouraged to continue monitoring his blood pressure at home.   With regard to his hyperlipidemia, I reviewed his labs, his LDL is still not at goal despite switching switching from pravastatin to Crestor. As he is tolerating Crestor without side effects I will increase his dose to 40 mg daily. I will repeat a lipid profile in prior to his next visit. He should follow up in clinic in 6 months. No change in AS or AI murmur. No change in exam.   Blair Heys, PA Student 04/22/20 2:17 PM   Patient seen and examined in conjunction with Blair Heys, PA second year  student at [employer].  Time spent is in direct patient face to face encounter not including the teaching and training involved.    Adrian Prows, MD, [facility] 04/22/2020, 2:17 PM Office: [phone]

## 2020-04-22 NOTE — Telephone Encounter (Signed)
Patient was notified per Dr. Mariea Clonts that his readings are ok and continue same regimen of medication

## 2020-04-26 ENCOUNTER — Encounter: Payer: Medicare HMO | Admitting: Family

## 2020-04-29 DIAGNOSIS — R972 Elevated prostate specific antigen [PSA]: Secondary | ICD-10-CM | POA: Diagnosis not present

## 2020-04-30 ENCOUNTER — Encounter: Payer: Self-pay | Admitting: Family

## 2020-04-30 ENCOUNTER — Ambulatory Visit (INDEPENDENT_AMBULATORY_CARE_PROVIDER_SITE_OTHER): Payer: Medicare HMO | Admitting: Family

## 2020-04-30 ENCOUNTER — Other Ambulatory Visit: Payer: Self-pay

## 2020-04-30 ENCOUNTER — Telehealth: Payer: Self-pay

## 2020-04-30 DIAGNOSIS — Z1159 Encounter for screening for other viral diseases: Secondary | ICD-10-CM | POA: Diagnosis not present

## 2020-04-30 DIAGNOSIS — Z0000 Encounter for general adult medical examination without abnormal findings: Secondary | ICD-10-CM

## 2020-04-30 NOTE — Telephone Encounter (Signed)
Mr. hagop, mccollam are scheduled for a virtual visit with your provider today.    Just as we do with appointments in the office, we must obtain your consent to participate.  Your consent will be active for this visit and any virtual visit you may have with one of our providers in the next 365 days.    If you have a MyChart account, I can also send a copy of this consent to you electronically.  All virtual visits are billed to your insurance company just like a traditional visit in the office.  As this is a virtual visit, video technology does not allow for your provider to perform a traditional examination.  This may limit your provider's ability to fully assess your condition.  If your provider identifies any concerns that need to be evaluated in person or the need to arrange testing such as labs, EKG, etc, we will make arrangements to do so.    Although advances in technology are sophisticated, we cannot ensure that it will always work on either your end or our end.  If the connection with a video visit is poor, we may have to switch to a telephone visit.  With either a video or telephone visit, we are not always able to ensure that we have a secure connection.   I need to obtain your verbal consent now.   Are you willing to proceed with your visit today?   ROMELO SCIANDRA has provided verbal consent on 04/30/2020 for a virtual visit (video or telephone).   [place], [place] 04/30/2020  9:30 AM

## 2020-04-30 NOTE — Progress Notes (Signed)
   This service is provided via telemedicine  No vital signs collected/recorded due to the encounter was a telemedicine visit.   Location of patient (ex: home, work): Home.   Patient consents to a telephone visit: Yes  Location of the provider (ex: office, home):  [facility].  Name of any referring provider: N/A  Names of all persons participating in the telemedicine service and their role in the encounter: Patient, [name] ( God Daughter), Heriberto [place], [place], Financial trader, [place], NP.    Time spent on call: 8 minutes spent on the phone with Medical Assistant.

## 2020-04-30 NOTE — Patient Instructions (Signed)
[name] , Thank you for taking time to come for your Medicare Wellness Visit. I appreciate your ongoing commitment to your health goals. Please review the following plan we discussed and let me know if I can assist you in the future.   Screening recommendations/referrals: Colonoscopy: N/a  Recommended yearly ophthalmology/optometry visit for glaucoma screening and checkup Recommended yearly dental visit for hygiene and checkup  Vaccinations: Influenza vaccine Due oct-Nov 2021  Pneumococcal vaccine: Up to date  Tdap vaccine : Up to date  Shingles vaccine Please Shigrix vaccine at your pharmacy    Advanced directives: No   Conditions/risks identified: Advance age male > 61 yrs,Hypertension,dyslipidemia,male Gender,Hx of smoking   Next appointment: 1 year   Preventive Care 74 Years and Older, Male Preventive care refers to lifestyle choices and visits with your health care provider that can promote health and wellness. What does preventive care include?  A yearly physical exam. This is also called an annual well check.  Dental exams once or twice a year.  Routine eye exams. Ask your health care provider how often you should have your eyes checked.  Personal lifestyle choices, including:  Daily care of your teeth and gums.  Regular physical activity.  Eating a healthy diet.  Avoiding tobacco and drug use.  Limiting alcohol use.  Practicing safe sex.  Taking low doses of aspirin every day.  Taking vitamin and mineral supplements as recommended by your health care provider. What happens during an annual well check? The services and screenings done by your health care provider during your annual well check will depend on your age, overall health, lifestyle risk factors, and family history of disease. Counseling  Your health care provider may ask you questions about your:  Alcohol use.  Tobacco use.  Drug use.  Emotional well-being.  Home and relationship  well-being.  Sexual activity.  Eating habits.  History of falls.  Memory and ability to understand (cognition).  Work and work Statistician. Screening  You may have the following tests or measurements:  Height, weight, and BMI.  Blood pressure.  Lipid and cholesterol levels. These may be checked every 5 years, or more frequently if you are over 34 years old.  Skin check.  Lung cancer screening. You may have this screening every year starting at age 34 if you have a 30-pack-year history of smoking and currently smoke or have quit within the past 15 years.  Fecal occult blood test (FOBT) of the stool. You may have this test every year starting at age 33.  Flexible sigmoidoscopy or colonoscopy. You may have a sigmoidoscopy every 5 years or a colonoscopy every 10 years starting at age 6.  Prostate cancer screening. Recommendations will vary depending on your family history and other risks.  Hepatitis C blood test.  Hepatitis B blood test.  Sexually transmitted disease (STD) testing.  Diabetes screening. This is done by checking your blood sugar (glucose) after you have not eaten for a while (fasting). You may have this done every 1-3 years.  Abdominal aortic aneurysm (AAA) screening. You may need this if you are a current or former smoker.  Osteoporosis. You may be screened starting at age 30 if you are at high risk. Talk with your health care provider about your test results, treatment options, and if necessary, the need for more tests. Vaccines  Your health care provider may recommend certain vaccines, such as:  Influenza vaccine. This is recommended every year.  Tetanus, diphtheria, and acellular pertussis (Tdap, Td)  vaccine. You may need a Td booster every 10 years.  Zoster vaccine. You may need this after age 39.  Pneumococcal 13-valent conjugate (PCV13) vaccine. One dose is recommended after age 24.  Pneumococcal polysaccharide (PPSV23) vaccine. One dose is  recommended after age 72. Talk to your health care provider about which screenings and vaccines you need and how often you need them. This information is not intended to replace advice given to you by your health care provider. Make sure you discuss any questions you have with your health care provider. Document Released: 08/16/2015 Document Revised: 04/08/2016 Document Reviewed: 05/21/2015 Elsevier Interactive Patient Education  2017 [employer] Prevention in the Home Falls can cause injuries. They can happen to people of all ages. There are many things you can do to make your home safe and to help prevent falls. What can I do on the outside of my home?  Regularly fix the edges of walkways and driveways and fix any cracks.  Remove anything that might make you trip as you walk through a door, such as a raised step or threshold.  Trim any bushes or trees on the path to your home.  Use bright outdoor lighting.  Clear any walking paths of anything that might make someone trip, such as rocks or tools.  Regularly check to see if handrails are loose or broken. Make sure that both sides of any steps have handrails.  Any raised decks and porches should have guardrails on the edges.  Have any leaves, snow, or ice cleared regularly.  Use sand or salt on walking paths during winter.  Clean up any spills in your garage right away. This includes oil or grease spills. What can I do in the bathroom?  Use night lights.  Install grab bars by the toilet and in the tub and shower. Do not use towel bars as grab bars.  Use non-skid mats or decals in the tub or shower.  If you need to sit down in the shower, use a plastic, non-slip stool.  Keep the floor dry. Clean up any water that spills on the floor as soon as it happens.  Remove soap buildup in the tub or shower regularly.  Attach bath mats securely with double-sided non-slip rug tape.  Do not have throw rugs and other things on  the floor that can make you trip. What can I do in the bedroom?  Use night lights.  Make sure that you have a light by your bed that is easy to reach.  Do not use any sheets or blankets that are too big for your bed. They should not hang down onto the floor.  Have a firm chair that has side arms. You can use this for support while you get dressed.  Do not have throw rugs and other things on the floor that can make you trip. What can I do in the kitchen?  Clean up any spills right away.  Avoid walking on wet floors.  Keep items that you use a lot in easy-to-reach places.  If you need to reach something above you, use a strong step stool that has a grab bar.  Keep electrical cords out of the way.  Do not use floor polish or wax that makes floors slippery. If you must use wax, use non-skid floor wax.  Do not have throw rugs and other things on the floor that can make you trip. What can I do with my stairs?  Do not leave  any items on the stairs.  Make sure that there are handrails on both sides of the stairs and use them. Fix handrails that are broken or loose. Make sure that handrails are as long as the stairways.  Check any carpeting to make sure that it is firmly attached to the stairs. Fix any carpet that is loose or worn.  Avoid having throw rugs at the top or bottom of the stairs. If you do have throw rugs, attach them to the floor with carpet tape.  Make sure that you have a light switch at the top of the stairs and the bottom of the stairs. If you do not have them, ask someone to add them for you. What else can I do to help prevent falls?  Wear shoes that:  Do not have high heels.  Have rubber bottoms.  Are comfortable and fit you well.  Are closed at the toe. Do not wear sandals.  If you use a stepladder:  Make sure that it is fully opened. Do not climb a closed stepladder.  Make sure that both sides of the stepladder are locked into place.  Ask someone to  hold it for you, if possible.  Clearly mark and make sure that you can see:  Any grab bars or handrails.  First and last steps.  Where the edge of each step is.  Use tools that help you move around (mobility aids) if they are needed. These include:  Canes.  Walkers.  Scooters.  Crutches.  Turn on the lights when you go into a dark area. Replace any light bulbs as soon as they burn out.  Set up your furniture so you have a clear path. Avoid moving your furniture around.  If any of your floors are uneven, fix them.  If there are any pets around you, be aware of where they are.  Review your medicines with your doctor. Some medicines can make you feel dizzy. This can increase your chance of falling. Ask your doctor what other things that you can do to help prevent falls. This information is not intended to replace advice given to you by your health care provider. Make sure you discuss any questions you have with your health care provider. Document Released: 05/16/2009 Document Revised: 12/26/2015 Document Reviewed: 08/24/2014 Elsevier Interactive Patient Education  2017 [employer].

## 2020-04-30 NOTE — Progress Notes (Signed)
Subjective:   [name] is a 79 y.o. male who presents for Medicare Annual/Subsequent preventive examination.  Review of Systems     Cardiac Risk Factors include: advanced age (>75men, >53 women);hypertension;dyslipidemia;male gender;smoking/ tobacco exposure     Objective:    There were no vitals filed for this visit. There is no height or weight on file to calculate BMI.  Advanced Directives 04/30/2020 03/21/2020 11/29/2019 11/28/2019 11/20/2019 08/24/2019 07/20/2019  Does Patient Have a Medical Advance Directive? No No No No No No No  Would patient like information on creating a medical advance directive? No - Patient declined Yes (ED - Information included in AVS) No - Patient declined No - Patient declined No - Patient declined No - Patient declined No - Patient declined    Current Medications (verified) Outpatient Encounter Medications as of 04/30/2020  Medication Sig  . amLODipine (NORVASC) 5 MG tablet Take 1 tablet (5 mg total) by mouth daily.  Marland Kitchen aspirin 81 MG tablet Take 81 mg by mouth daily.    . Calcium Carbonate-Vit D-Min (CALTRATE 600+D PLUS PO) Take by mouth. 1 tablet 2 times a day  . cilostazol (PLETAL) 100 MG tablet TAKE 1 TABLET TWICE DAILY  . ezetimibe (ZETIA) 10 MG tablet Take 1 tablet (10 mg total) by mouth daily.  . furosemide (LASIX) 20 MG tablet TAKE ONE TABLET BY MOUTH EVERY MORNING AND TAKE ONE TABLET BY MOUTH EVERY EVENING  . latanoprost (XALATAN) 0.005 % ophthalmic solution Place 1 drop into both eyes at bedtime.   Marland Kitchen losartan (COZAAR) 100 MG tablet Take 1 tablet (100 mg total) by mouth daily.  . rosuvastatin (CRESTOR) 40 MG tablet Take 1 tablet (40 mg total) by mouth daily.  . timolol (BETIMOL) 0.5 % ophthalmic solution Place 1 drop into both eyes every morning.   . [DISCONTINUED] NIFEdipine (ADALAT CC) 60 MG 24 hr tablet Take 1 tablet (60 mg total) by mouth daily. (Patient not taking: Reported on 04/22/2020)   No facility-administered encounter  medications on file as of 04/30/2020.    Allergies (verified) Penicillins   History: Past Medical History:  Diagnosis Date  . Anemia   . Cataract    traumatic  . Chronic diastolic heart failure ([place])   . Chronic kidney disease, stage III (moderate)   . Depressive disorder, not elsewhere classified   . Depressive disorder, not elsewhere classified   . Elevated prostate specific antigen (PSA)   . Elevated prostate specific antigen (PSA)   . Gingivitis   . 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 Hyperlipidemia   . Hypertension   . Hypertensive kidney disease, benign   . 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 Microscopic hematuria   . Nondependent cannabis abuse   . Pure hypercholesterolemia   . Unspecified late effects of cerebrovascular disease   . Unspecified late effects of cerebrovascular disease   . Unspecified vitamin D deficiency   . Unspecified vitamin D deficiency    Past Surgical History:  Procedure Laterality Date  . CATARACT EXTRACTION    . CORONARY STENT PLACEMENT     2008  . EYE SURGERY     left eye,    cornea repair  . PERIPHERAL VASCULAR CATHETERIZATION N/A 07/28/2016   Procedure: Lower Extremity Angiography;  Surgeon: Adrian Prows, MD;  Location: [facility];  Service: Cardiovascular;  Laterality: N/A;   Family History  Problem Relation  Age of Onset  . Cancer Father    Social History   Socioeconomic History  . Marital status: Divorced    Spouse name: Not on file  . Number of children: 2  . Years of education: Not on file  . Highest education level: Not on file  Occupational History  . Not on file  Tobacco Use  . Smoking status: Former Smoker    Packs/day: 0.50    Years: 13.00    Pack years: 6.50    Types: Cigarettes    Quit date: 03/09/1971    Years since quitting: 49.1  . Smokeless tobacco: Never Used  Vaping Use  .  Vaping Use: Never used  Substance and Sexual Activity  . Alcohol use: No    Alcohol/week: 0.0 standard drinks  . Drug use: No  . Sexual activity: Not on file  Other Topics Concern  . Not on file  Social History Narrative  . Not on file   Social Determinants of Health   Financial Resource Strain:   . Difficulty of Paying Living Expenses: Not on file  Food Insecurity:   . Worried About Charity fundraiser in the Last Year: Not on file  . Ran Out of Food in the Last Year: Not on file  Transportation Needs:   . Lack of Transportation (Medical): Not on file  . Lack of Transportation (Non-Medical): Not on file  Physical Activity:   . Days of Exercise per Week: Not on file  . Minutes of Exercise per Session: Not on file  Stress:   . Feeling of Stress : Not on file  Social Connections:   . Frequency of Communication with Friends and Family: Not on file  . Frequency of Social Gatherings with Friends and Family: Not on file  . Attends Religious Services: Not on file  . Active Member of Clubs or Organizations: Not on file  . Attends Archivist Meetings: Not on file  . Marital Status: Not on file    Tobacco Counseling Counseling given: Not Answered   Clinical Intake:  Pre-visit preparation completed: No  Pain : No/denies pain     BMI - recorded: 22.19 Nutritional Status: BMI of 19-24  Normal Nutritional Risks: None Diabetes: No  How often do you need to have someone help you when you read instructions, pamphlets, or other written materials from your doctor or pharmacy?: 1 - Never What is the last grade level you completed in school?: 1 college  Diabetic?NO   Interpreter Needed?: No  Information entered by :: Ibraham Levi C.Breyon Blass FNP-C   Activities of Daily Living In your present state of health, do you have any difficulty performing the following activities: 04/30/2020  Hearing? N  Vision? N  Difficulty concentrating or making decisions? N  Walking or  climbing stairs? N  Dressing or bathing? N  Doing errands, shopping? N  Preparing Food and eating ? N  Using the Toilet? N  In the past six months, have you accidently leaked urine? N  Do you have problems with loss of bowel control? N  Managing your Medications? Y  Comment [name] assist  Managing your Finances? N  Housekeeping or managing your Housekeeping? Y  Comment Has someone to do yard  Some recent data might be hidden    Patient Care Team: Gayland Curry, DO as PCP - General (Geriatric Medicine) Adrian Prows, MD as Consulting Physician (Cardiology) Clent Jacks, MD as Consulting Physician (Ophthalmology) Mal Misty, MD (Inactive) as Consulting Physician (  Vascular Surgery) Garvin Fila, MD as Consulting Physician (Neurology) Hayden Pedro, MD as Consulting Physician (Ophthalmology)  Indicate any recent Medical Services you may have received from other than [facility] providers in the past year (date may be approximate).     Assessment:   This is a routine wellness examination for [name].  Hearing/Vision screen  Hearing Screening   125Hz  250Hz  500Hz  1000Hz  2000Hz  3000Hz  4000Hz  6000Hz  8000Hz   Right ear:           Left ear:           Comments: No Hearing Concerns.   Vision Screening Comments: No Vision Concerns. Patient wears prescription glasses. Last Eye Exam was August 2021.  Dietary issues and exercise activities discussed: Current Exercise Habits: Home exercise routine, Type of exercise: Other - see comments (pedal Bike), Time (Minutes): 15, Frequency (Times/Week): 3, Weekly Exercise (Minutes/Week): 45, Intensity: Moderate, Exercise limited by: None identified  Goals    . Maintain lifestyle     Patient will maintain Lifestyle      Depression Screen PHQ 2/9 Scores 04/30/2020 03/21/2020 08/24/2019 07/20/2019 04/25/2019 04/03/2019 12/01/2018  PHQ - 2 Score 0 0 0 0 0 0 0    Fall Risk Fall Risk  04/30/2020 03/21/2020 11/20/2019 08/24/2019 07/20/2019  Falls in the  past year? 0 0 0 0 0  Number falls in past yr: 0 0 0 0 0  Injury with Fall? 0 0 0 0 0    Any stairs in or around the home? Yes  If so, are there any without handrails? No  Home free of loose throw rugs in walkways, pet beds, electrical cords, etc? No  Adequate lighting in your home to reduce risk of falls? Yes   ASSISTIVE DEVICES UTILIZED TO PREVENT FALLS:  Life alert? No  Use of a cane, walker or w/c? No  Grab bars in the bathroom? No  Shower chair or bench in shower? No  Elevated toilet seat or a handicapped toilet? No   TIMED UP AND GO:  Was the test performed? No .  Length of time to ambulate 10 feet: N/A  sec.   Gait slow and steady without use of assistive device  Cognitive Function: MMSE - Mini Mental State Exam 04/18/2018 03/10/2017 03/26/2016  Orientation to time 5 5 5   Orientation to Place 4 5 5   Registration 3 3 3   Attention/ Calculation 5 5 5   Recall 1 2 2   Language- name 2 objects 2 2 2   Language- repeat 1 1 1   Language- follow 3 step command 3 3 2   Language- read & follow direction 1 1 1   Write a sentence 1 1 1   Copy design 1 1 1   Total score 27 29 28      6CIT Screen 04/30/2020 04/25/2019  What Year? 0 points 0 points  What month? 0 points 0 points  What time? 0 points 0 points  Count back from 20 0 points 0 points  Months in reverse 0 points 0 points  Repeat phrase 6 points -  Total Score 6 -    Immunizations Immunization History  Administered Date(s) Administered  . Fluad Quad(high Dose 65+) 04/03/2019  . Influenza Split 04/29/2010  . Influenza, High Dose Seasonal PF 07/22/2017, 04/18/2018  . Influenza,inj,Quad PF,6+ Mos 06/05/2013, 07/09/2014, 06/14/2015  . PFIZER SARS-COV-2 Vaccination 03/02/2020, 04/02/2020  . Pneumococcal Conjugate-13 10/02/2013  . Pneumococcal Polysaccharide-23 11/05/2011  . Tdap 04/25/2019    TDAP status: Up to date Flu Vaccine status: Up to date  Pneumococcal vaccine status: Up to date Covid-19 vaccine status:  Completed vaccines  Qualifies for Shingles Vaccine? Yes   Zostavax completed No   Shingrix Completed?: No.    Education has been provided regarding the importance of this vaccine. Patient has been advised to call insurance company to determine out of pocket expense if they have not yet received this vaccine. Advised may also receive vaccine at local pharmacy or Health Dept. Verbalized acceptance and understanding.  Screening Tests Health Maintenance  Topic Date Due  . Hepatitis C Screening  Never done  . INFLUENZA VACCINE  06/21/2020 (Originally 03/03/2020)  . TETANUS/TDAP  04/24/2029  . COVID-19 Vaccine  Completed  . PNA vac Low Risk Adult  Completed    Health Maintenance  Health Maintenance Due  Topic Date Due  . Hepatitis C Screening  Never done    Colorectal cancer screening: No longer required.   Lung Cancer Screening: (Low Dose CT Chest recommended if Age 37-80 years, 30 pack-year currently smoking OR have quit w/in 15years.) does not qualify.   Lung Cancer Screening Referral: NO   Additional Screening:  Hepatitis C Screening: does qualify; Completed No   Vision Screening: Recommended annual ophthalmology exams for early detection of glaucoma and other disorders of the eye. Is the patient up to date with their annual eye exam?  Yes  Who is the provider or what is the name of the office in which the patient attends annual eye exams? Dr.Groat  If pt is not established with a provider, would they like to be referred to a provider to establish care? No .   Dental Screening: Recommended annual dental exams for proper oral hygiene  Community Resource Referral / Chronic Care Management: CRR required this visit?  No   CCM required this visit?  No      Plan:   - Aware due for Influenza vaccine 06/2020  - Hep C ordered to be done with next blood work 07/22/2020  - Shingrix vaccine at his pharmacy   I have personally reviewed and noted the following in the patient's  chart:   . Medical and social history . Use of alcohol, tobacco or illicit drugs  . Current medications and supplements . Functional ability and status . Nutritional status . Physical activity . Advanced directives . List of other physicians . Hospitalizations, surgeries, and ER visits in previous 12 months . Vitals . Screenings to include cognitive, depression, and falls . Referrals and appointments  In addition, I have reviewed and discussed with patient certain preventive protocols, quality metrics, and best practice recommendations. A written personalized care plan for preventive services as well as general preventive health recommendations were provided to patient.    Sandrea Hughs, NP   04/30/2020   Nurse Notes: Advised to get his Influenza vaccine oct-Nov 2021  - Advised to get Shingrix vaccine at his pharmacy

## 2020-05-01 ENCOUNTER — Other Ambulatory Visit: Payer: Self-pay | Admitting: Internal Medicine

## 2020-05-01 ENCOUNTER — Other Ambulatory Visit: Payer: Self-pay

## 2020-05-01 DIAGNOSIS — E7800 Pure hypercholesterolemia, unspecified: Secondary | ICD-10-CM

## 2020-05-01 MED ORDER — ROSUVASTATIN CALCIUM 40 MG PO TABS: 40.0000 mg | ORAL_TABLET | Freq: Every day | ORAL | 0 refills | Status: DC

## 2020-05-06 DIAGNOSIS — N400 Enlarged prostate without lower urinary tract symptoms: Secondary | ICD-10-CM | POA: Diagnosis not present

## 2020-05-06 DIAGNOSIS — R972 Elevated prostate specific antigen [PSA]: Secondary | ICD-10-CM | POA: Diagnosis not present

## 2020-05-16 ENCOUNTER — Other Ambulatory Visit: Payer: Self-pay | Admitting: Cardiology

## 2020-05-16 DIAGNOSIS — E7800 Pure hypercholesterolemia, unspecified: Secondary | ICD-10-CM

## 2020-05-20 ENCOUNTER — Telehealth: Payer: Self-pay | Admitting: Internal Medicine

## 2020-05-20 NOTE — Telephone Encounter (Deleted)
[name] called about [name] this morning and she was concerned about a bp medication he is taking, she said she saw an article about his med that he is currently taking and says that other countries do not use this med, they say it is not effective and it can cause many side effects. She says the patient sometimes can have the side effects she read about and she is just worried about him taking it if it isnt effective and is discontinued in other countries she just wants to ask a couple questions about the medication.She would like somebody to call her back today if possible

## 2020-05-21 NOTE — Telephone Encounter (Signed)
[name] called about [place] this morning and she was concerned about a bp medication he is taking, she said she saw an article about his med that he is currently taking and says that other countries do not use this med, they say it is not effective and it can cause many side effects. She says the patient sometimes can have the side effects she read about and she is just worried about him taking it if it isnt effective and is discontinued in other countries she just wants to ask a couple questions about the medication.She would like somebody to call her back today if possible at [phone]

## 2020-05-21 NOTE — Telephone Encounter (Signed)
Called and spoke with Jonelle Sidle, Daughter. She read an Article about Losartan regarding different countries stopping the medication due to bad side effects.   I asked daughter if patient was having any side effects from taking the medication and she just stated she Wonders if the bruising that patient is getting is coming from the medication.   Daughter stated that she sent an article to [facility] regarding this medication and she wants you to review the article first before responding to her.   Lovena Le printed off the article and placed in Dr. Cyndi Lennert folder to review.

## 2020-05-21 NOTE — Telephone Encounter (Deleted)
Phone number to call patients daughter back Is [phone].

## 2020-05-22 ENCOUNTER — Other Ambulatory Visit: Payer: Self-pay | Admitting: Internal Medicine

## 2020-05-27 NOTE — Telephone Encounter (Signed)
I reviewed the articles provided.  If his losartan has not come from one of the listed batches of medication, then there is no reason to stop it.  Other patients on it have not had bleeding problems that I've seen.  He's already had this looked into by the specialist also.

## 2020-05-27 NOTE — Telephone Encounter (Signed)
Cecil notified and agreed.

## 2020-07-10 ENCOUNTER — Other Ambulatory Visit: Payer: Self-pay | Admitting: Cardiology

## 2020-07-22 ENCOUNTER — Other Ambulatory Visit: Payer: Medicare HMO

## 2020-07-22 ENCOUNTER — Other Ambulatory Visit: Payer: Self-pay

## 2020-07-22 ENCOUNTER — Ambulatory Visit (INDEPENDENT_AMBULATORY_CARE_PROVIDER_SITE_OTHER): Payer: Medicare HMO | Admitting: Internal Medicine

## 2020-07-22 ENCOUNTER — Encounter: Payer: Self-pay | Admitting: Internal Medicine

## 2020-07-22 VITALS — BP 138/87 | HR 68 | Temp 97.8°F | Ht 71.0 in | Wt 162.0 lb

## 2020-07-22 DIAGNOSIS — E7800 Pure hypercholesterolemia, unspecified: Secondary | ICD-10-CM

## 2020-07-22 DIAGNOSIS — Z1159 Encounter for screening for other viral diseases: Secondary | ICD-10-CM

## 2020-07-22 DIAGNOSIS — I5032 Chronic diastolic (congestive) heart failure: Secondary | ICD-10-CM

## 2020-07-22 DIAGNOSIS — G3184 Mild cognitive impairment, so stated: Secondary | ICD-10-CM | POA: Diagnosis not present

## 2020-07-22 DIAGNOSIS — R739 Hyperglycemia, unspecified: Secondary | ICD-10-CM

## 2020-07-22 DIAGNOSIS — Z23 Encounter for immunization: Secondary | ICD-10-CM | POA: Diagnosis not present

## 2020-07-22 DIAGNOSIS — I10 Essential (primary) hypertension: Secondary | ICD-10-CM | POA: Diagnosis not present

## 2020-07-22 DIAGNOSIS — I69351 Hemiplegia and hemiparesis following cerebral infarction affecting right dominant side: Secondary | ICD-10-CM

## 2020-07-22 DIAGNOSIS — N1832 Chronic kidney disease, stage 3b: Secondary | ICD-10-CM

## 2020-07-22 NOTE — Progress Notes (Signed)
Location:  [facility] clinic Provider:  Zyla Dascenzo L. Mariea Clonts, D.O., C.M.D.  Goals of Care:  Advanced Directives 07/22/2020  Does Patient Have a Medical Advance Directive? No  Would patient like information on creating a medical advance directive? No - Patient declined     Chief Complaint  Patient presents with  . Medical Management of Chronic Issues    4 month follow up   . Health Maintenance    Covid 19 booster due in Feb. Hep C.    HPI: Patient is a 79 y.o. male seen today for medical management of chronic diseases.    He is upset b/c he cannot go visit his friends.  He wants his driving approved everywhere.  He had a syncopal episode 11/29/19--explained he cannot drive at least for a year.     BP a little elevated upon arrival.  Needs recheck at the end.  Discussed checking once a week at home.    Due for booster in Feb.   Having some dry patches on skin.    Past Medical History:  Diagnosis Date  . Anemia   . Cataract    traumatic  . Chronic diastolic heart failure ([place])   . Chronic kidney disease, stage III (moderate) (HCC)   . Depressive disorder, not elsewhere classified   . Depressive disorder, not elsewhere classified   . Elevated prostate specific antigen (PSA)   . Elevated prostate specific antigen (PSA)   . Gingivitis   . 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 Hyperlipidemia   . Hypertension   . Hypertensive kidney disease, benign   . 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 Microscopic hematuria   . Nondependent cannabis abuse   . Pure hypercholesterolemia   . Unspecified late effects of cerebrovascular disease   . Unspecified late effects of cerebrovascular disease   . Unspecified vitamin D deficiency   . Unspecified vitamin D deficiency     Past Surgical History:  Procedure Laterality Date  . CATARACT EXTRACTION    . CORONARY STENT PLACEMENT      2008  . EYE SURGERY     left eye,    cornea repair  . PERIPHERAL VASCULAR CATHETERIZATION N/A 07/28/2016   Procedure: Lower Extremity Angiography;  Surgeon: Adrian Prows, MD;  Location: [facility];  Service: Cardiovascular;  Laterality: N/A;    Allergies  Allergen Reactions  . Penicillins Other (See Comments)    Unknown  Has patient had a PCN reaction causing immediate rash, facial/tongue/throat swelling, SOB or lightheadedness with hypotension: {unknown Has patient had a PCN reaction causing severe rash involving mucus membranes or skin necrosis: unknown Has patient had a PCN reaction that required hospitalization {unknown Has patient had a PCN reaction occurring within the last 10 years: no If all of the above answers are "NO", then may proceed with Cephalosporin use.    Outpatient Encounter Medications as of 07/22/2020  Medication Sig  . amLODipine (NORVASC) 5 MG tablet TAKE 1 TABLET (5 MG TOTAL) BY MOUTH DAILY.  Marland Kitchen aspirin 81 MG tablet Take 81 mg by mouth daily.  . Calcium Carbonate-Vit D-Min (CALTRATE 600+D PLUS PO) Take by mouth. 1 tablet 2 times a day  . ezetimibe (ZETIA) 10 MG tablet Take 1 tablet (10 mg total) by mouth daily.  . furosemide (LASIX) 20 MG tablet TAKE ONE TABLET BY MOUTH EVERY MORNING AND TAKE ONE TABLET BY MOUTH EVERY  EVENING  . latanoprost (XALATAN) 0.005 % ophthalmic solution Place 1 drop into both eyes at bedtime.   Marland Kitchen losartan (COZAAR) 100 MG tablet Take 1 tablet (100 mg total) by mouth daily.  . rosuvastatin (CRESTOR) 40 MG tablet TAKE 1 TABLET BY MOUTH EVERY DAY  . timolol (BETIMOL) 0.5 % ophthalmic solution Place 1 drop into both eyes every morning.   . [DISCONTINUED] cilostazol (PLETAL) 100 MG tablet TAKE 1 TABLET TWICE DAILY   No facility-administered encounter medications on file as of 07/22/2020.    Review of Systems:  Review of Systems  Constitutional: Negative for chills and fever.  HENT: Negative for congestion and sore throat.    Eyes: Negative for blurred vision.  Respiratory: Negative for shortness of breath.   Cardiovascular: Negative for chest pain, palpitations and leg swelling.  Gastrointestinal: Negative for abdominal pain and constipation.  Genitourinary: Negative for dysuria.  Musculoskeletal: Negative for falls and joint pain.  Skin: Positive for itching.  Neurological: Negative for dizziness and loss of consciousness.       Last syncope end of April of this year  Psychiatric/Behavioral: Positive for memory loss. Negative for depression. The patient is not nervous/anxious and does not have insomnia.     Health Maintenance  Topic Date Due  . Hepatitis C Screening  Never done  . COVID-19 Vaccine (3 - Pfizer risk 4-dose series) 04/30/2020  . TETANUS/TDAP  04/24/2029  . INFLUENZA VACCINE  Completed  . PNA vac Low Risk Adult  Completed    Physical Exam: Vitals:   07/22/20 1007 07/22/20 1156  BP: (!) 142/90 138/87  Pulse: 68   Temp: 97.8 F (36.6 C)   TempSrc: Temporal   SpO2: 98%   Weight: 162 lb (73.5 kg)   Height: 5\' 11"  (1.803 m)    Body mass index is 22.59 kg/m. Physical Exam Vitals reviewed.  Constitutional:      General: He is not in acute distress.    Appearance: Normal appearance. He is not toxic-appearing.  HENT:     Head: Normocephalic and atraumatic.  Eyes:     Conjunctiva/sclera: Conjunctivae normal.     Pupils: Pupils are equal, round, and reactive to light.  Cardiovascular:     Rate and Rhythm: Normal rate and regular rhythm.     Heart sounds: Murmur heard.    Pulmonary:     Effort: Pulmonary effort is normal.     Breath sounds: Normal breath sounds.  Abdominal:     General: Bowel sounds are normal.  Musculoskeletal:        General: Normal range of motion.     Right lower leg: No edema.     Left lower leg: No edema.  Neurological:     General: No focal deficit present.     Mental Status: He is alert and oriented to person, place, and time.     Cranial Nerves:  No cranial nerve deficit.  Psychiatric:        Mood and Affect: Mood normal.        Behavior: Behavior normal.     Labs reviewed: Basic Metabolic Panel: Recent Labs    11/15/19 0834 11/28/19 1546  NA 140 140  K 5.0 4.5  CL 106 107  CO2 27 22  GLUCOSE 103* 99  BUN 43* 45*  CREATININE 1.92* 2.07*  CALCIUM 10.0 9.9   Liver Function Tests: No results for input(s): AST, ALT, ALKPHOS, BILITOT, PROT, ALBUMIN in the last 8760 hours. No results for input(s): LIPASE, AMYLASE  in the last 8760 hours. No results for input(s): AMMONIA in the last 8760 hours. CBC: Recent Labs    08/24/19 1215 11/28/19 1546  WBC 5.7 6.6  NEUTROABS 3,426  --   HGB 11.8* 11.7*  HCT 36.2* 38.4*  MCV 88.9 92.8  PLT 266 266   Lipid Panel: Recent Labs    11/15/19 0834 03/22/20 0934  CHOL 153 153  HDL 45 50  LDLCALC 91 90  TRIG 81 64  CHOLHDL 3.4  --    Lab Results  Component Value Date   HGBA1C 6.2 (H) 12/05/2018     Assessment/Plan 1. Essential hypertension -bp elevated upon arrival--recheck:  138/87  2. Hyperglycemia -has been ok recently, cont dietary changes  3. Mild cognitive impairment with memory loss -has short-term memory loss--we've talked about his driving every visit since his privileges were revoked due to his initial syncope -then had syncope again 4/28 so cannot drive another year at least -apparently has been driving with limitations b/c OT approved   4. Pure hypercholesterolemia -cont current therapy and monitor  5. Stage 3b chronic kidney disease (HCC) -Avoid nephrotoxic agents like nsaids, dose adjust renally excreted meds, hydrate.  6. Chronic diastolic heart failure (HCC) -euvolemic, doing fine, followed by cardiology  7. 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 Need for influenza vaccination -high dose given today  Labs/tests ordered:  Hep c ab done  earlier before appt Next appt:  F/u early may--no labs  Rhilyn Battle L. Enedina Pair, D.O. [facility] [address], [place] [address] Cell Phone (Mon-Fri 8am-5pm):  [phone] On Call:  [phone] & follow prompts after 5pm & weekends Office Phone:  [phone] Office Fax:  [phone]

## 2020-07-22 NOTE — Patient Instructions (Addendum)
Try putting cortisone-10 on the dry patches on your skin.    Covid booster is due in Feb:  Go to: ShippingSc[employer].co.uk  Booster Information COVID-19 BOOSTER UPDATE: Everyone Ages 18+ Is Eligible for a COVID-19 Booster  Appointments are required. To register for your free vaccination booster appointment,  call [phone] (Mon-Fri 7 a.m. to 7 p.m).  There is a calendar link to schedule on the website at various locations around the [place].  Pfizer-BioNTech or Moderna, eligible 6 months after receiving your second shot. Single-dose [employer], eligible 2 months after receiving your initial shot. Following FDA and CDC recommendations, we can "mix-and-match" booster doses at the clinics. You must attest to the date you received your last dose of the [place], [place] or Wynetta Emery and [employer] vaccine to schedule your booster dose.

## 2020-07-23 LAB — HEPATITIS C ANTIBODY
Hepatitis C Ab: NONREACTIVE
SIGNAL TO CUT-OFF: 0.01 (ref <1.00)

## 2020-07-24 DIAGNOSIS — I509 Heart failure, unspecified: Secondary | ICD-10-CM | POA: Diagnosis not present

## 2020-07-24 DIAGNOSIS — I110 Hypertensive heart disease with heart failure: Secondary | ICD-10-CM | POA: Diagnosis not present

## 2020-07-31 ENCOUNTER — Other Ambulatory Visit: Payer: Self-pay | Admitting: Internal Medicine

## 2020-09-13 ENCOUNTER — Other Ambulatory Visit: Payer: Self-pay | Admitting: Cardiology

## 2020-09-18 ENCOUNTER — Telehealth: Payer: Self-pay | Admitting: Internal Medicine

## 2020-09-18 NOTE — Telephone Encounter (Signed)
She will need to call Dr. Irven Shelling office.

## 2020-09-18 NOTE — Telephone Encounter (Signed)
Medication Changes      (Completed Course)  This is your ov note from 07/22/20, medication was discontinued at your ov. Dr Einar Gip informed pt of this info. Dr Einar Gip did not discontinue this medication.  They are questioning this why?   Please advise  [name]

## 2020-09-18 NOTE — Telephone Encounter (Signed)
pts daughter Jonelle Sidle called concerned bc Cilostazol had been cancelled per Dr Einar Gip office, they were were not aware & had not been told that Mr Ratchford was to stop this medication.  Could someone call to explain or advise for this point?  Thanks, Vilinda Blanks.

## 2020-09-19 MED ORDER — CILOSTAZOL 100 MG PO TABS: 100.0000 mg | ORAL_TABLET | Freq: Two times a day (BID) | ORAL | 3 refills | Status: DC

## 2020-09-19 NOTE — Telephone Encounter (Signed)
Looking on his med list this is what I found .Marland Kitchen... Start Date: -- End Date: 11/22/18  Discontinued by: Adrian Prows, MD on 11/22/2018 10:48      Written Date: -- Expiration Date: --  Ordering Date: 08/10/16

## 2020-09-19 NOTE — Telephone Encounter (Signed)
Medication re-added to med list and sent in for a refill.

## 2020-09-19 NOTE — Addendum Note (Signed)
Addended by: Tanna Savoy on: 09/19/2020 10:11 AM   Modules accepted: Orders

## 2020-09-19 NOTE — Telephone Encounter (Signed)
Any idea why this medication was removed from [facility] med list when he was last here?  I know you typically remove them when the patient says they are not taking them.  They are now calling asking why it was "stopped".

## 2020-09-23 ENCOUNTER — Encounter: Payer: Self-pay | Admitting: Internal Medicine

## 2020-09-23 ENCOUNTER — Other Ambulatory Visit: Payer: Self-pay | Admitting: Internal Medicine

## 2020-10-03 ENCOUNTER — Encounter (INDEPENDENT_AMBULATORY_CARE_PROVIDER_SITE_OTHER): Payer: Medicare HMO | Admitting: Ophthalmology

## 2020-10-03 ENCOUNTER — Other Ambulatory Visit: Payer: Self-pay

## 2020-10-03 DIAGNOSIS — I10 Essential (primary) hypertension: Secondary | ICD-10-CM | POA: Diagnosis not present

## 2020-10-03 DIAGNOSIS — H2511 Age-related nuclear cataract, right eye: Secondary | ICD-10-CM | POA: Diagnosis not present

## 2020-10-03 DIAGNOSIS — H35033 Hypertensive retinopathy, bilateral: Secondary | ICD-10-CM | POA: Diagnosis not present

## 2020-10-03 DIAGNOSIS — H43811 Vitreous degeneration, right eye: Secondary | ICD-10-CM

## 2020-10-03 DIAGNOSIS — H35342 Macular cyst, hole, or pseudohole, left eye: Secondary | ICD-10-CM

## 2020-10-03 DIAGNOSIS — H353122 Nonexudative age-related macular degeneration, left eye, intermediate dry stage: Secondary | ICD-10-CM | POA: Diagnosis not present

## 2020-10-04 ENCOUNTER — Ambulatory Visit: Payer: Medicare HMO | Attending: Internal Medicine

## 2020-10-04 DIAGNOSIS — Z23 Encounter for immunization: Secondary | ICD-10-CM

## 2020-10-04 NOTE — Progress Notes (Signed)
   Covid-19 Vaccination Clinic  Name:  [name]    MRN: [id number] DOB: 10/10/1940  10/04/2020  [name] was observed post Covid-19 immunization for 15 minutes without incident. He was provided with Vaccine Information Sheet and instruction to access the V-Safe system.   [name] was instructed to call 911 with any severe reactions post vaccine: Marland Kitchen Difficulty breathing  . Swelling of face and throat  . A fast heartbeat  . A bad rash all over body  . Dizziness and weakness   Immunizations Administered    Name Date Dose VIS Date Route   PFIZER Comrnaty(Gray TOP) Covid-19 Vaccine 10/04/2020  1:08 PM 0.3 mL 07/11/2020 Intramuscular   Manufacturer: [employer] [place]   Lot: [id number]   NDC: [id number]

## 2020-10-08 DIAGNOSIS — H401123 Primary open-angle glaucoma, left eye, severe stage: Secondary | ICD-10-CM | POA: Diagnosis not present

## 2020-10-08 DIAGNOSIS — H401112 Primary open-angle glaucoma, right eye, moderate stage: Secondary | ICD-10-CM | POA: Diagnosis not present

## 2020-10-23 ENCOUNTER — Encounter: Payer: Self-pay | Admitting: Cardiology

## 2020-10-23 ENCOUNTER — Ambulatory Visit: Payer: Medicare HMO | Admitting: Cardiology

## 2020-10-23 ENCOUNTER — Other Ambulatory Visit: Payer: Self-pay

## 2020-10-23 VITALS — BP 140/59 | HR 63 | Temp 98.0°F | Resp 16 | Ht 71.0 in | Wt 160.2 lb

## 2020-10-23 DIAGNOSIS — I10 Essential (primary) hypertension: Secondary | ICD-10-CM

## 2020-10-23 DIAGNOSIS — E7800 Pure hypercholesterolemia, unspecified: Secondary | ICD-10-CM | POA: Diagnosis not present

## 2020-10-23 DIAGNOSIS — I779 Disorder of arteries and arterioles, unspecified: Secondary | ICD-10-CM | POA: Diagnosis not present

## 2020-10-23 DIAGNOSIS — N1831 Chronic kidney disease, stage 3a: Secondary | ICD-10-CM | POA: Diagnosis not present

## 2020-10-23 DIAGNOSIS — I69354 Hemiplegia and hemiparesis following cerebral infarction affecting left non-dominant side: Secondary | ICD-10-CM | POA: Diagnosis not present

## 2020-10-23 DIAGNOSIS — I351 Nonrheumatic aortic (valve) insufficiency: Secondary | ICD-10-CM | POA: Diagnosis not present

## 2020-10-23 NOTE — Progress Notes (Signed)
Primary Physician/Referring:  Wardell Honour, MD  Patient ID: [name], male    DOB: 1941/06/05, 80 y.o.   MRN: [id number]  Chief Complaint  Patient presents with  . Hyperlipidemia  . Hypertension  . PAD  . Follow-up    6 month    HPI: DEONDRE MARINARO  is a 80 y.o. 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  The patient presents for 6 month follow up.He denies any new complaints. Denies worsening dyspnea or chest pain.  Denies worsening claudication. Denies dizziness, syncopal episodes.  Past Medical History:  Diagnosis Date  . Anemia   . Cataract    traumatic  . Chronic diastolic heart failure ([place])   . Chronic kidney disease, stage III (moderate) (HCC)   . Depressive disorder, not elsewhere classified   . Depressive disorder, not elsewhere classified   . Elevated prostate specific antigen (PSA)   . Elevated prostate specific antigen (PSA)   . Gingivitis   . 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 Hyperlipidemia   . Hypertension   . Hypertensive kidney disease, benign   . 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 Microscopic hematuria   . Nondependent cannabis abuse   . Pure hypercholesterolemia   . Unspecified late effects of cerebrovascular disease   . Unspecified late effects of cerebrovascular disease   . Unspecified vitamin D deficiency   . Unspecified vitamin D deficiency     Past Surgical History:  Procedure Laterality Date  . CATARACT EXTRACTION    . CORONARY STENT PLACEMENT     2008  . EYE SURGERY     left eye,    cornea repair  . PERIPHERAL VASCULAR  CATHETERIZATION N/A 07/28/2016   Procedure: Lower Extremity Angiography;  Surgeon: Adrian Prows, MD;  Location: [facility];  Service: Cardiovascular;  Laterality: N/A;   Social History   Tobacco Use  . Smoking status: Former Smoker    Packs/day: 0.50    Years: 13.00    Pack years: 6.50    Types: Cigarettes    Quit date: 03/09/1971    Years since quitting: 49.6  . Smokeless tobacco: Never Used  Substance Use Topics  . Alcohol use: No    Alcohol/week: 0.0 standard drinks    Marital Status: Widowed   Review of Systems  Cardiovascular: Positive for claudication (bilateral calf and also night cramps). Negative for dyspnea on exertion.  Musculoskeletal: Positive for muscle cramps and muscle weakness (right sided weakness from stroke). Negative for joint swelling.  Neurological: Positive for focal weakness (right hemiparesis).  Psychiatric/Behavioral: Negative for depression and substance abuse.  All other systems reviewed and are negative.  Objective  Blood pressure (!) 140/59, pulse 63, temperature 98 F (36.7 C), temperature source Temporal, resp. rate 16, height _0  (1.803 m), weight 160 lb 3.2 oz (72.7 kg), SpO2 99 %. Body mass index is 22.34 kg/m.  Vitals with BMI 10/23/2020 10/23/2020 07/22/2020  Height - _1  -  Weight - 160 lbs 3 oz -  BMI - 33.29 -  Systolic 518 841 660  Diastolic 59 50 87  Pulse 63 68 -       Physical  Exam Constitutional:      General: He is not in acute distress.    Appearance: He is well-developed.  Neck:     Thyroid: No thyromegaly.     Vascular: No JVD.  Cardiovascular:     Rate and Rhythm: Normal rate and regular rhythm.     Pulses:          Carotid pulses are on the right side with bruit and on the left side with bruit.      Femoral pulses are 2+ on the right side and 2+ on the left side.      Popliteal pulses are 1+ on the right side and 2+ on the left side.       Dorsalis pedis pulses are 0 on the right side and 0 on the left  side.       Posterior tibial pulses are 0 on the right side and 0 on the left side.     Heart sounds: S1 normal and S2 normal. Murmur heard.   Early systolic murmur is present with a grade of 2/6 at the upper right sternal border radiating to the neck. High-pitched blowing decrescendo early diastolic murmur is present with a grade of 3/4 at the upper right sternal border radiating to the apex. No gallop.      Comments: Bilateral legs loss of hair. Capillary refill normal. No ulceration. No edema. Pulmonary:     Effort: Pulmonary effort is normal.     Breath sounds: Normal breath sounds.  Abdominal:     General: Bowel sounds are normal.     Palpations: Abdomen is soft.    Radiology: CT scan of the head without contrast: 11/29/2019: No acute intracranial abnormality. Findings consistent with age related atrophy and chronic small vessel ischemia The area of encephalomalacia involving the right frontoparietal lobe  CXR 11/28/2009: There is no evidence of acute infiltrate, pleural effusion or pneumothorax. The cardiac silhouette is mildly enlarged. There is mild calcification of the thoracic aorta. The visualized skeletal structures are unremarkable.  Laboratory examination:   CMP Latest Ref Rng & Units 11/28/2019 11/15/2019 12/05/2018  Glucose 70 - 99 mg/dL 99 103(H) 105(H)  BUN 8 - 23 mg/dL 45(H) 43(H) 39(H)  Creatinine 0.61 - 1.24 mg/dL 2.07(H) 1.92(H) 1.50(H)  Sodium 135 - 145 mmol/L 140 140 140  Potassium 3.5 - 5.1 mmol/L 4.5 5.0 4.5  Chloride 98 - 111 mmol/L 107 106 106  CO2 22 - 32 mmol/L _0 Calcium 8.9 - 10.3 mg/dL 9.9 10.0 9.9  Total Protein 6.1 - 8.1 g/dL - - 7.7  Total Bilirubin 0.2 - 1.2 mg/dL - - 0.3  Alkaline Phos 40 - 115 U/L - - -  AST 10 - 35 U/L - - 16  ALT 9 - 46 U/L - - 9   CBC Latest Ref Rng & Units 11/28/2019 08/24/2019 06/15/2019  WBC 4.0 - 10.5 K/uL 6.6 5.7 5.1  Hemoglobin 13.0 - 17.0 g/dL 11.7(L) 11.8(L) 12.3(L)  Hematocrit 39.0 - 52.0 % 38.4(L)  36.2(L) 38.8  Platelets 150 - 400 K/uL 266 266 261   Lipid Panel  Recent Labs    11/15/19 0834 03/22/20 0934  CHOL 153 153  TRIG 81 64  LDLCALC 91 90  HDL 45 50  CHOLHDL 3.4  --     HEMOGLOBIN A1C Lab Results  Component Value Date   HGBA1C 6.2 (H) 12/05/2018   MPG 131 12/05/2018   TSH No results for input(s): TSH in the last 8760  hours.   Allergies:   Allergies  Allergen Reactions  . Penicillins Other (See Comments)    Unknown  Has patient had a PCN reaction causing immediate rash, facial/tongue/throat swelling, SOB or lightheadedness with hypotension: {unknown Has patient had a PCN reaction causing severe rash involving mucus membranes or skin necrosis: unknown Has patient had a PCN reaction that required hospitalization {unknown Has patient had a PCN reaction occurring within the last 10 years: no If all of the above answers are "NO", then may proceed with Cephalosporin use.    Medications:    PRN Meds:. There are no discontinued medications. Current Outpatient Medications on File Prior to Visit  Medication Sig Dispense Refill  . amLODipine (NORVASC) 5 MG tablet TAKE 1 TABLET (5 MG TOTAL) BY MOUTH DAILY. 90 tablet 1  . aspirin 81 MG tablet Take 81 mg by mouth daily.    . Calcium Carbonate-Vit D-Min (CALTRATE 600+D PLUS PO) Take by mouth. 1 tablet 2 times a day    . cilostazol (PLETAL) 100 MG tablet Take 1 tablet (100 mg total) by mouth 2 (two) times daily. 90 tablet 3  . ezetimibe (ZETIA) 10 MG tablet TAKE 1 TABLET (10 MG TOTAL) BY MOUTH DAILY. 90 tablet 1  . furosemide (LASIX) 20 MG tablet TAKE ONE TABLET BY MOUTH EVERY MORNING AND TAKE ONE TABLET BY MOUTH EVERY EVENING 180 tablet 1  . latanoprost (XALATAN) 0.005 % ophthalmic solution Place 1 drop into both eyes at bedtime.     Marland Kitchen losartan (COZAAR) 100 MG tablet Take 1 tablet (100 mg total) by mouth daily. 90 tablet 1  . Multiple Vitamins-Minerals (HEALTHY EYES/LUTEIN) TABS Take 1 tablet by mouth daily.    .  rosuvastatin (CRESTOR) 40 MG tablet TAKE 1 TABLET BY MOUTH EVERY DAY 7 tablet 0  . timolol (BETIMOL) 0.5 % ophthalmic solution Place 1 drop into both eyes every morning.      No current facility-administered medications on file prior to visit.     Cardiac Studies:   Lower Extremity Doppler [06/16/2016]:  No hemodynamically significant stenoses are identified in the right lower extremity arterial system. There is severe diffuse mixed plaque throughout. Moderate velocity increase at the left mid superficial femoral artery suggesting >50% stenosis. There is severe diffuse mixed plaque througout. This exam reveals severely decreased perfusion of the right lower extremity with RABI 0.46 and moderately decreased perfusion of the left lower extremity with LABI 0.69, noted at the post tibial artery level.  Peripheral arteriogram 07/28/2016: Moderate diffuse SFA disease. Right below knee severe diffuse disease with PT run-off. Left PT and Peroneal run-off, diffuse disease  Carotid Doppler  08/12/2017: Mild stenosis in the right internal carotid artery (1-15%). Minimal stenosis in the right common carotid artery (<50%). Antegrade right vertebral artery flow. Bidirectional left vertebral artery flow. May suggest proximal left subclavian stenosis. Compared to the study done on 08/12/2016, right ICA stenosis of less than 50% no longer present. Further studies when clinically indicated.  Remote outpatient cardiac telemetry 11/29/2019 for 1 week: 08/31/2019 through 12/04/2019: Predominant rhythm is normal sinus rhythm.  Minimum heart rate 48, maximum heart rate 131 and average heart rate 67 bpm. Ventricular ectopy 309 22 with 11 ventricular pairs and 0 ventricular tachycardia runs. 13,034 PACs.  5 supraventricular runs, longest 13 beats.  No atrial fibrillation.  There were 0 patient activated events.  Echocardiogram 01/08/2020: Left ventricle cavity is normal in size. Moderate concentric hypertrophy  of  the left ventricle. Normal global wall motion. Normal LV  systolic  function with EF 61%. Doppler evidence of grade I (impaired) diastolic  dysfunction, normal LAP.  Trileaflet aortic valve. Moderate calcification. Moderate aortic stenosis.  Aortic valve mean gradient of 35 mmHg, Vmax of 4.2 m/s. Calculated aortic  valve area by continuity equation is 1.7 cm. Moderate (Grade III) aortic  regurgitation.  Mild tricuspid regurgitation. Estimated pulmonary artery systolic pressure  is 23 mmHg.  Compared to previous study in 2018, there is progression of aortic  stenosis severity (MG up from 17 to 35 mmHg).  Lexiscan Tetrofosmin stress test 12/20/2019: No previous exam available for comparison. Lexiscan nuclear stress test performed using 1-day protocol. Stress EKG is non-diagnostic, as this is pharmacological stress test. Rest and stress EKG shows sinus rhythm, RBBB, T wave inversion inferolateral leads, occasional PAC. SPECT images show small sized, mild intensity, mildly reversible perfusion defect in mid to basal inferior myocardium. Stress LVEF calculated 44%, although visually appears normal. No regional abnormalities of myocardial thickening noted. Low risk study.   EKG:  EKG 10/23/2020: Normal sinus rhythm at rate of 61 bpm, left atrial enlargement, normal axis, normal axis.  Right bundle branch block.  LVH with repolarization abnormality, cannot exclude inferior lateral ischemia.   EKG 01/17/2020: Normal sinus rhythm with rate of 63 bpm, left atrial abnormality, left axis deviation, left anterior fascicular block.  Right bundle branch block.  Bifascicular block.  LVH with repolarization abnormality, cannot exclude anterolateral ischemia, inferior ischemia.  PACs, no significant change from 11/08/2017.  Assessment     ICD-10-CM   1. Essential hypertension  I10 EKG 12-Lead    CMP14+EGFR    CBC  2. Moderate aortic regurgitation  I35.1   3. PAOD (peripheral arterial occlusive disease) (HCC)   I77.9   4. Stage 3a chronic kidney disease (HCC)  N18.31   5. Hypercholesteremia  E78.00 Lipid Panel With LDL/HDL Ratio   There are no discontinued medications. No orders of the defined types were placed in this encounter.  Orders Placed This Encounter  Procedures  . Lipid Panel With LDL/HDL Ratio  . CMP14+EGFR  . CBC  . EKG 12-Lead    Recommendations:   Sylvester "Warner Mccreedy"  Sweitzer is a 80 y.o. 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  The patient presents today for 6 month follow up for hypertension, peripheral arterial disease and hyperlipidemia. Overall he is feeling well and has no specific complaints except for chronic symptoms of claudication in bilateral calves.  His physical examination has not changed with absent pedal pulses.  In view of no limb threatening ischemia and normal rest pain, symptoms of PAD being stable, continue medical therapy especially in view of chronic stage IIIb kidney disease.  He needs labs including follow-up of renal function, lipids and CBC.  Blood pressure is well controlled, was elevated when he initially walked in. I will address his lipids once we have the blood report.  No changes in the medications were done today.  With regard to his lower extremity examination, he has onychomycosis involving multiple toes and may need podiatry evaluation for toenail trimming.  Advised him that he should be extremely careful as it can lead to slow healing ulcerations or even limb loss.    Adrian Prows, MD, [facility] 10/23/2020, 9:50 PM Office: [phone]

## 2020-10-31 DIAGNOSIS — R972 Elevated prostate specific antigen [PSA]: Secondary | ICD-10-CM | POA: Diagnosis not present

## 2020-11-07 DIAGNOSIS — R972 Elevated prostate specific antigen [PSA]: Secondary | ICD-10-CM | POA: Diagnosis not present

## 2020-11-07 DIAGNOSIS — N400 Enlarged prostate without lower urinary tract symptoms: Secondary | ICD-10-CM | POA: Diagnosis not present

## 2020-11-11 DIAGNOSIS — E7800 Pure hypercholesterolemia, unspecified: Secondary | ICD-10-CM | POA: Diagnosis not present

## 2020-11-11 DIAGNOSIS — I10 Essential (primary) hypertension: Secondary | ICD-10-CM | POA: Diagnosis not present

## 2020-11-12 LAB — LIPID PANEL WITH LDL/HDL RATIO
Cholesterol, Total: 113 mg/dL (ref 100–199)
HDL: 46 mg/dL (ref >39)
LDL Chol Calc (NIH): 54 mg/dL (ref 0–99)
LDL/HDL Ratio: 1.2 ratio (ref 0.0–3.6)
Triglycerides: 59 mg/dL (ref 0–149)
VLDL Cholesterol Cal: 13 mg/dL (ref 5–40)

## 2020-11-12 LAB — CBC
Hematocrit: 35.9 % — ABNORMAL LOW (ref 37.5–51.0)
Hemoglobin: 11.5 g/dL — ABNORMAL LOW (ref 13.0–17.7)
MCH: 28.8 pg (ref 26.6–33.0)
MCHC: 32 g/dL (ref 31.5–35.7)
MCV: 90 fL (ref 79–97)
Platelets: 241 10*3/uL (ref 150–450)
RBC: 3.99 x10E6/uL — ABNORMAL LOW (ref 4.14–5.80)
RDW: 13.1 % (ref 11.6–15.4)
WBC: 5.6 10*3/uL (ref 3.4–10.8)

## 2020-11-12 LAB — CMP14+EGFR
ALT: 17 IU/L (ref 0–44)
AST: 26 IU/L (ref 0–40)
Albumin/Globulin Ratio: 1.5 (ref 1.2–2.2)
Albumin: 4.5 g/dL (ref 3.7–4.7)
Alkaline Phosphatase: 82 IU/L (ref 44–121)
BUN/Creatinine Ratio: 24 (ref 10–24)
BUN: 48 mg/dL — ABNORMAL HIGH (ref 8–27)
Bilirubin Total: 0.2 mg/dL (ref 0.0–1.2)
CO2: 21 mmol/L (ref 20–29)
Calcium: 9.6 mg/dL (ref 8.6–10.2)
Chloride: 105 mmol/L (ref 96–106)
Creatinine, Ser: 1.98 mg/dL — ABNORMAL HIGH (ref 0.76–1.27)
Globulin, Total: 3.1 g/dL (ref 1.5–4.5)
Glucose: 105 mg/dL — ABNORMAL HIGH (ref 65–99)
Potassium: 5.1 mmol/L (ref 3.5–5.2)
Sodium: 143 mmol/L (ref 134–144)
Total Protein: 7.6 g/dL (ref 6.0–8.5)
eGFR: 34 mL/min/{1.73_m2} — ABNORMAL LOW (ref >59)

## 2020-12-02 ENCOUNTER — Other Ambulatory Visit: Payer: Self-pay

## 2020-12-02 ENCOUNTER — Other Ambulatory Visit: Payer: Self-pay | Admitting: *Deleted

## 2020-12-02 MED ORDER — AMLODIPINE BESYLATE 5 MG PO TABS: 5.0000 mg | ORAL_TABLET | Freq: Every day | ORAL | 3 refills | Status: DC

## 2020-12-02 MED ORDER — LOSARTAN POTASSIUM 100 MG PO TABS: 100.0000 mg | ORAL_TABLET | Freq: Every day | ORAL | 1 refills | Status: DC

## 2020-12-02 NOTE — Telephone Encounter (Signed)
Patient requested refill to [facility].

## 2020-12-23 ENCOUNTER — Ambulatory Visit: Payer: Medicare HMO | Admitting: Internal Medicine

## 2020-12-25 ENCOUNTER — Encounter: Payer: Self-pay | Admitting: Family Medicine

## 2020-12-25 ENCOUNTER — Ambulatory Visit (INDEPENDENT_AMBULATORY_CARE_PROVIDER_SITE_OTHER): Payer: Medicare HMO | Admitting: Family Medicine

## 2020-12-25 ENCOUNTER — Other Ambulatory Visit: Payer: Self-pay

## 2020-12-25 VITALS — BP 158/78 | HR 74 | Temp 97.3°F | Ht 71.0 in | Wt 159.4 lb

## 2020-12-25 DIAGNOSIS — I5032 Chronic diastolic (congestive) heart failure: Secondary | ICD-10-CM | POA: Diagnosis not present

## 2020-12-25 DIAGNOSIS — N1832 Chronic kidney disease, stage 3b: Secondary | ICD-10-CM | POA: Diagnosis not present

## 2020-12-25 DIAGNOSIS — E7800 Pure hypercholesterolemia, unspecified: Secondary | ICD-10-CM

## 2020-12-25 DIAGNOSIS — I69351 Hemiplegia and hemiparesis following cerebral infarction affecting right dominant side: Secondary | ICD-10-CM

## 2020-12-25 DIAGNOSIS — L853 Xerosis cutis: Secondary | ICD-10-CM | POA: Diagnosis not present

## 2020-12-25 DIAGNOSIS — I10 Essential (primary) hypertension: Secondary | ICD-10-CM

## 2020-12-25 DIAGNOSIS — I350 Nonrheumatic aortic (valve) stenosis: Secondary | ICD-10-CM | POA: Diagnosis not present

## 2020-12-25 MED ORDER — SARNA 0.5-0.5 % EX LOTN: 1.0000 "application " | TOPICAL_LOTION | CUTANEOUS | 0 refills | Status: DC | PRN

## 2020-12-25 NOTE — Progress Notes (Signed)
Provider:  Alain Honey, MD  Careteam: Patient Care Team: Wardell Honour, MD as PCP - General (Family Medicine) Adrian Prows, MD as Consulting Physician (Cardiology) Clent Jacks, MD as Consulting Physician (Ophthalmology) Mal Misty, MD (Inactive) as Consulting Physician (Vascular Surgery) Garvin Fila, MD as Consulting Physician (Neurology) Hayden Pedro, MD as Consulting Physician (Ophthalmology)  PLACE OF SERVICE:  [facility]  Advanced Directive information    Allergies  Allergen Reactions  . Penicillins Other (See Comments)    Unknown  Has patient had a PCN reaction causing immediate rash, facial/tongue/throat swelling, SOB or lightheadedness with hypotension: {unknown Has patient had a PCN reaction causing severe rash involving mucus membranes or skin necrosis: unknown Has patient had a PCN reaction that required hospitalization {unknown Has patient had a PCN reaction occurring within the last 10 years: no If all of the above answers are "NO", then may proceed with Cephalosporin use.    Chief Complaint  Patient presents with  . Medical Management of Chronic Issues    Patient presents today for 5 month follow-up for HTN and HLD. He also have a concern about his dry skin on his arms, back, stomach and legs. He states the areas are very dry and itchy. He reports applying OTC creams for eczema and it has not worked or helped with the dry skin.     HPI: Patient is a 80 y.o. male former patient of mine who is here for 58-month follow-up for his blood pressure and lipids.  Accompanied by daughter who lives with him.  Chief complaint today is related to dry skin on his arms back buttocks stomach.  Has been suggested he might have eczema. There is a history of old stroke but he has no essential residual. There is a diagnosis of mild cognitive impairment but in talking to the daughter and in interviewing patient today it is hard to detect any inabilities  here. He does have some chronic kidney disease most recent creatinine last month was 1.98 with BUN of 48. Also has elevated PSA at 15.5.  [facility] is followed by [facility] and there is no suggestion of prostate cancer. His main concern today is his driver's license.  He has some restrictions to time of day speed limit.  He feels like these restrictions were made in narrow and would like me to amend form so he could have restrictions eased.  I told him I would help with this if he brings me proper forms to complete.  Review of Systems:  Review of Systems  Respiratory: Negative.   Cardiovascular: Negative.   Neurological: Negative.   Psychiatric/Behavioral: Negative.   All other systems reviewed and are negative.   Past Medical History:  Diagnosis Date  . Anemia   . Cataract    traumatic  . Chronic diastolic heart failure ([place])   . Chronic kidney disease, stage III (moderate) (HCC)   . Depressive disorder, not elsewhere classified   . Depressive disorder, not elsewhere classified   . Elevated prostate specific antigen (PSA)   . Elevated prostate specific antigen (PSA)   . Gingivitis   . 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 Hyperlipidemia   . Hypertension   . Hypertensive kidney disease, benign   . 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 Microscopic hematuria   . Nondependent cannabis abuse   . Pure hypercholesterolemia   .  Unspecified late effects of cerebrovascular disease   . Unspecified late effects of cerebrovascular disease   . Unspecified vitamin D deficiency   . Unspecified vitamin D deficiency    Past Surgical History:  Procedure Laterality Date  . CATARACT EXTRACTION    . CORONARY STENT PLACEMENT     2008  . EYE SURGERY     left eye,    cornea repair  . PERIPHERAL VASCULAR CATHETERIZATION N/A 07/28/2016   Procedure: Lower Extremity Angiography;   Surgeon: Adrian Prows, MD;  Location: [facility];  Service: Cardiovascular;  Laterality: N/A;   Social History:   reports that he quit smoking about 49 years ago. His smoking use included cigarettes. He has a 6.50 pack-year smoking history. He has never used smokeless tobacco. He reports that he does not drink alcohol and does not use drugs.  Family History  Problem Relation Age of Onset  . Cancer Father     Medications: Patient's Medications  New Prescriptions   CAMPHOR-MENTHOL (SARNA) LOTION    Apply 1 application topically as needed for itching.  Previous Medications   AMLODIPINE (NORVASC) 5 MG TABLET    Take 1 tablet (5 mg total) by mouth daily.   ASPIRIN 81 MG TABLET    Take 81 mg by mouth daily.   CALCIUM CARBONATE-VIT D-MIN (CALTRATE 600+D PLUS PO)    Take by mouth. 1 tablet 2 times a day   CILOSTAZOL (PLETAL) 100 MG TABLET    Take 1 tablet (100 mg total) by mouth 2 (two) times daily.   EZETIMIBE (ZETIA) 10 MG TABLET    TAKE 1 TABLET (10 MG TOTAL) BY MOUTH DAILY.   FUROSEMIDE (LASIX) 20 MG TABLET    TAKE ONE TABLET BY MOUTH EVERY MORNING AND TAKE ONE TABLET BY MOUTH EVERY EVENING   LATANOPROST (XALATAN) 0.005 % OPHTHALMIC SOLUTION    Place 1 drop into both eyes at bedtime.    LOSARTAN (COZAAR) 100 MG TABLET    Take 1 tablet (100 mg total) by mouth daily.   MULTIPLE VITAMINS-MINERALS (HEALTHY EYES/LUTEIN) TABS    Take 1 tablet by mouth daily.   ROSUVASTATIN (CRESTOR) 40 MG TABLET    TAKE 1 TABLET BY MOUTH EVERY DAY   TIMOLOL (BETIMOL) 0.5 % OPHTHALMIC SOLUTION    Place 1 drop into both eyes every morning.   Modified Medications   No medications on file  Discontinued Medications   No medications on file    Physical Exam:  Vitals:   12/25/20 0956  BP: (!) 158/78  Pulse: 74  Temp: (!) 97.3 F (36.3 C)  TempSrc: Temporal  SpO2: 98%  Weight: 159 lb 6.4 oz (72.3 kg)  Height: 5\' 11"  (1.803 m)   Body mass index is 22.23 kg/m. Wt Readings from Last 3 Encounters:   12/25/20 159 lb 6.4 oz (72.3 kg)  10/23/20 160 lb 3.2 oz (72.7 kg)  07/22/20 162 lb (73.5 kg)    Physical Exam Vitals and nursing note reviewed.  Constitutional:      Appearance: Normal appearance.  Cardiovascular:     Rate and Rhythm: Normal rate and regular rhythm.  Pulmonary:     Effort: Pulmonary effort is normal.     Breath sounds: Normal breath sounds.  Musculoskeletal:        General: Normal range of motion.  Skin:    General: Skin is dry.  Neurological:     General: No focal deficit present.     Mental Status: He is alert and  oriented to person, place, and time.  Psychiatric:        Mood and Affect: Mood normal.        Behavior: Behavior normal.        Thought Content: Thought content normal.        Judgment: Judgment normal.     Labs reviewed: Basic Metabolic Panel: Recent Labs    11/11/20 0828  NA 143  K 5.1  CL 105  CO2 21  GLUCOSE 105*  BUN 48*  CREATININE 1.98*  CALCIUM 9.6   Liver Function Tests: Recent Labs    11/11/20 0828  AST 26  ALT 17  ALKPHOS 82  BILITOT 0.2  PROT 7.6  ALBUMIN 4.5   No results for input(s): LIPASE, AMYLASE in the last 8760 hours. No results for input(s): AMMONIA in the last 8760 hours. CBC: Recent Labs    11/11/20 0828  WBC 5.6  HGB 11.5*  HCT 35.9*  MCV 90  PLT 241   Lipid Panel: Recent Labs    03/22/20 0934 11/11/20 0828  CHOL 153 113  HDL 50 46  LDLCALC 90 54  TRIG 64 59   TSH: No results for input(s): TSH in the last 8760 hours. A1C: Lab Results  Component Value Date   HGBA1C 6.2 (H) 12/05/2018     Assessment/Plan  1. Dry skin dermatitis Nonspecific dry skin.  Could be eczematous but will treat symptomatically with Sarna lotion which is mostly menthol preparation  - camphor-menthol (SARNA) lotion; Apply 1 application topically as needed for itching.  Dispense: 222 mL; Refill: 0  2. Chronic diastolic heart failure (HCC) This appears stable.  There is no edema.  3. Essential  hypertension Blood pressure today 150/78 he is on amlodipine as well as losartan.  Have recommended to split those medicines to take 1 at night and 1 in the morning.  Also can take Lasix 2 in the morning rather than 1 twice daily.  4. Nonrheumatic aortic valve stenosis Murmur of aortic stenosis probably at least 3-4 over 5.  Has seen cardiology previously and no intervention recommended  5. Hemiparesis of right dominant side as late effect of cerebral infarction ([place]) Minimal residual stroke.  Should not affect drive ability  6. Stage 3b chronic kidney disease ([place]) Probably related to longstanding hypertension.  Encouraged importance of hydration  7. Pure hypercholesterolemia Lipids are very good on combination of rosuvastatin and Zetia  Alain Honey, MD [place] [phone]

## 2021-01-01 ENCOUNTER — Other Ambulatory Visit: Payer: Self-pay | Admitting: *Deleted

## 2021-01-01 DIAGNOSIS — L853 Xerosis cutis: Secondary | ICD-10-CM

## 2021-01-01 MED ORDER — SARNA 0.5-0.5 % EX LOTN: 1.0000 "application " | TOPICAL_LOTION | CUTANEOUS | 0 refills | Status: DC | PRN

## 2021-01-01 NOTE — Telephone Encounter (Signed)
Patient called and stated that [facility] sent them a letter stating that they do not carry the [facility] and needs it sent to [facility].

## 2021-01-10 DIAGNOSIS — Z6823 Body mass index (BMI) 23.0-23.9, adult: Secondary | ICD-10-CM | POA: Diagnosis not present

## 2021-01-10 DIAGNOSIS — E261 Secondary hyperaldosteronism: Secondary | ICD-10-CM | POA: Diagnosis not present

## 2021-01-10 DIAGNOSIS — I69351 Hemiplegia and hemiparesis following cerebral infarction affecting right dominant side: Secondary | ICD-10-CM | POA: Diagnosis not present

## 2021-01-10 DIAGNOSIS — N183 Chronic kidney disease, stage 3 unspecified: Secondary | ICD-10-CM | POA: Diagnosis not present

## 2021-01-10 DIAGNOSIS — Z7982 Long term (current) use of aspirin: Secondary | ICD-10-CM | POA: Diagnosis not present

## 2021-01-10 DIAGNOSIS — Z87891 Personal history of nicotine dependence: Secondary | ICD-10-CM | POA: Diagnosis not present

## 2021-01-10 DIAGNOSIS — I739 Peripheral vascular disease, unspecified: Secondary | ICD-10-CM | POA: Diagnosis not present

## 2021-01-10 DIAGNOSIS — I5030 Unspecified diastolic (congestive) heart failure: Secondary | ICD-10-CM | POA: Diagnosis not present

## 2021-01-20 ENCOUNTER — Other Ambulatory Visit: Payer: Self-pay | Admitting: *Deleted

## 2021-01-20 MED ORDER — EZETIMIBE 10 MG PO TABS: 10.0000 mg | ORAL_TABLET | Freq: Every day | ORAL | 1 refills | Status: DC

## 2021-01-20 NOTE — Telephone Encounter (Signed)
Patient requested refill

## 2021-02-03 ENCOUNTER — Other Ambulatory Visit: Payer: Self-pay | Admitting: Cardiology

## 2021-02-03 DIAGNOSIS — E7800 Pure hypercholesterolemia, unspecified: Secondary | ICD-10-CM

## 2021-02-06 ENCOUNTER — Other Ambulatory Visit: Payer: Self-pay | Admitting: *Deleted

## 2021-02-06 MED ORDER — CILOSTAZOL 100 MG PO TABS: 100.0000 mg | ORAL_TABLET | Freq: Two times a day (BID) | ORAL | 1 refills | Status: DC

## 2021-02-06 NOTE — Telephone Encounter (Signed)
Daughter called requesting refill to be sent to [facility] Mail [facility]. [facility])

## 2021-02-19 ENCOUNTER — Telehealth: Payer: Medicare HMO | Admitting: *Deleted

## 2021-02-19 NOTE — Telephone Encounter (Signed)
Daughter called and stated that they keep getting a call from [facility] stating that Dr. Gabriel Carina needs to call them.   I called [facility] and spoke with Nickala and they needed a Rx for Furosemide renewed. Stated that they still had Dr. Mariea Clonts listed as his Provider.   Changed Provider to Dr. Sabra Heck and verbally refilled the Furosemide with the pharmacist.

## 2021-03-11 ENCOUNTER — Encounter: Payer: Self-pay | Admitting: *Deleted

## 2021-03-11 NOTE — Progress Notes (Signed)
Letter faxed to [facility] [employer] Fax: [phone] per patient's request. Also mailed copy to patient at home address listed.

## 2021-04-08 DIAGNOSIS — Z9889 Other specified postprocedural states: Secondary | ICD-10-CM | POA: Diagnosis not present

## 2021-04-08 DIAGNOSIS — H401112 Primary open-angle glaucoma, right eye, moderate stage: Secondary | ICD-10-CM | POA: Diagnosis not present

## 2021-04-08 DIAGNOSIS — H2511 Age-related nuclear cataract, right eye: Secondary | ICD-10-CM | POA: Diagnosis not present

## 2021-04-08 DIAGNOSIS — Z961 Presence of intraocular lens: Secondary | ICD-10-CM | POA: Diagnosis not present

## 2021-04-08 DIAGNOSIS — H401123 Primary open-angle glaucoma, left eye, severe stage: Secondary | ICD-10-CM | POA: Diagnosis not present

## 2021-04-21 ENCOUNTER — Other Ambulatory Visit: Payer: Self-pay | Admitting: Family Medicine

## 2021-04-25 ENCOUNTER — Other Ambulatory Visit: Payer: Self-pay

## 2021-04-25 ENCOUNTER — Encounter: Payer: Self-pay | Admitting: Cardiology

## 2021-04-25 ENCOUNTER — Ambulatory Visit: Payer: Medicare HMO | Admitting: Cardiology

## 2021-04-25 VITALS — BP 129/52 | HR 68 | Temp 97.2°F | Resp 17 | Ht 71.0 in | Wt 160.8 lb

## 2021-04-25 DIAGNOSIS — I779 Disorder of arteries and arterioles, unspecified: Secondary | ICD-10-CM

## 2021-04-25 DIAGNOSIS — E7800 Pure hypercholesterolemia, unspecified: Secondary | ICD-10-CM | POA: Diagnosis not present

## 2021-04-25 DIAGNOSIS — I351 Nonrheumatic aortic (valve) insufficiency: Secondary | ICD-10-CM | POA: Diagnosis not present

## 2021-04-25 DIAGNOSIS — I129 Hypertensive chronic kidney disease with stage 1 through stage 4 chronic kidney disease, or unspecified chronic kidney disease: Secondary | ICD-10-CM | POA: Diagnosis not present

## 2021-04-25 DIAGNOSIS — N1831 Chronic kidney disease, stage 3a: Secondary | ICD-10-CM

## 2021-04-25 DIAGNOSIS — I10 Essential (primary) hypertension: Secondary | ICD-10-CM

## 2021-04-25 NOTE — Progress Notes (Signed)
Primary Physician/Referring:  Wardell Honour, MD  Patient ID: Caryl Ada, male    DOB: 1940/12/23, 80 y.o.   MRN: [id number]  Chief Complaint  Patient presents with   Follow-up    6 month   PAD   Hypertension   Hyperlipidemia    HPI: ALEXANDERJAMES BERG  is a 80 y.o. 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  The patient presents today for 6 month follow up for hypertension, peripheral arterial disease and hyperlipidemia. Overall he is feeling well and has no specific complaints except for chronic symptoms of claudication in bilateral calves.  No other symptoms, denies chest pain or dyspnea, no PND or orthopnea.  He is accompanied by his God daughter Jonelle Sidle.  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 ([place]) 12/11/2018   Hyperlipidemia    Hypertension    Hypertensive kidney disease, benign    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)    Microscopic hematuria    Nondependent cannabis abuse    Pure hypercholesterolemia    Unspecified late effects of cerebrovascular disease    Unspecified late effects of cerebrovascular disease    Unspecified vitamin D deficiency    Unspecified vitamin D deficiency     Past Surgical History:  Procedure Laterality Date    CATARACT EXTRACTION     CORONARY STENT PLACEMENT     2008   EYE SURGERY     left eye,    cornea repair   PERIPHERAL VASCULAR CATHETERIZATION N/A 07/28/2016   Procedure: Lower Extremity Angiography;  Surgeon: Adrian Prows, MD;  Location: [facility];  Service: Cardiovascular;  Laterality: N/A;   Social History   Tobacco Use   Smoking status: Former    Packs/day: 0.50    Years: 13.00    Pack years: 6.50    Types: Cigarettes    Quit date: 03/09/1971    Years since quitting: 50.1   Smokeless tobacco: Never  Substance Use Topics   Alcohol use: No    Alcohol/week: 0.0 standard drinks    Marital Status: Widowed   Review of Systems  Cardiovascular:  Positive for claudication (bilateral calf and also night cramps). Negative for chest pain and dyspnea on exertion.  Musculoskeletal:  Positive for muscle weakness (right sided weakness from stroke). Negative for joint swelling.  Gastrointestinal:  Negative for melena.  Neurological:  Positive for focal weakness (right hemiparesis).  Psychiatric/Behavioral:  Negative for depression and substance abuse.   All other systems reviewed and are negative. Objective  Blood pressure (!) 129/52, pulse 68, temperature (!) 97.2 F (36.2 C), temperature source Temporal, resp. rate 17, height 5\' 11"  (1.803 m), weight 160 lb 12.8 oz (72.9 kg), SpO2 99 %. Body mass index is 22.43 kg/m.  Vitals with BMI 04/25/2021 04/25/2021 12/25/2020  Height - 5\' 11"  5\' 11"   Weight - 160 lbs 13 oz 159 lbs 6 oz  BMI - 37.85 88.50  Systolic 277 412 878  Diastolic 52 56 78  Pulse 68 87 74       Physical Exam Constitutional:      General: He is not in acute distress.    Appearance: He is well-developed.  Neck:     Thyroid: No thyromegaly.     Vascular: No JVD.  Cardiovascular:     Rate and Rhythm: Normal rate and regular rhythm.     Pulses:          Carotid pulses are  on the right side with bruit and  on the left side with bruit.      Femoral pulses are 2+  on the right side and 2+ on the left side.      Popliteal pulses are 1+ on the right side and 2+ on the left side.       Dorsalis pedis pulses are 0 on the right side and 0 on the left side.       Posterior tibial pulses are 0 on the right side and 0 on the left side.     Heart sounds: S1 normal and S2 normal. Murmur heard.  Early systolic murmur is present with a grade of 2/6 at the upper right sternal border radiating to the neck.  High-pitched blowing decrescendo early diastolic murmur is present with a grade of 3/4 at the upper right sternal border radiating to the apex.    No gallop.     Comments: Bilateral legs loss of hair. Capillary refill normal. No ulceration. No edema. Pulmonary:     Effort: Pulmonary effort is normal.     Breath sounds: Normal breath sounds.  Abdominal:     General: Bowel sounds are normal.     Palpations: Abdomen is soft.   Radiology: CT scan of the head without contrast: 11/29/2019: No acute intracranial abnormality. Findings consistent with age related atrophy and chronic small vessel ischemia The area of encephalomalacia involving the right frontoparietal lobe  CXR 11/28/2009: There is no evidence of acute infiltrate, pleural effusion or pneumothorax. The cardiac silhouette is mildly enlarged. There is mild calcification of the thoracic aorta. The visualized skeletal structures are unremarkable.  Laboratory examination:   CMP Latest Ref Rng & Units 11/11/2020 11/28/2019 11/15/2019  Glucose 65 - 99 mg/dL 105(H) 99 103(H)  BUN 8 - 27 mg/dL 48(H) 45(H) 43(H)  Creatinine 0.76 - 1.27 mg/dL 1.98(H) 2.07(H) 1.92(H)  Sodium 134 - 144 mmol/L 143 140 140  Potassium 3.5 - 5.2 mmol/L 5.1 4.5 5.0  Chloride 96 - 106 mmol/L 105 107 106  CO2 20 - 29 mmol/L 21 22 27   Calcium 8.6 - 10.2 mg/dL 9.6 9.9 10.0  Total Protein 6.0 - 8.5 g/dL 7.6 - -  Total Bilirubin 0.0 - 1.2 mg/dL 0.2 - -  Alkaline Phos 44 - 121 IU/L 82 - -  AST 0 - 40 IU/L 26 - -  ALT 0 - 44 IU/L 17 -  -   CBC Latest Ref Rng & Units 11/11/2020 11/28/2019 08/24/2019  WBC 3.4 - 10.8 x10E3/uL 5.6 6.6 5.7  Hemoglobin 13.0 - 17.7 g/dL 11.5(L) 11.7(L) 11.8(L)  Hematocrit 37.5 - 51.0 % 35.9(L) 38.4(L) 36.2(L)  Platelets 150 - 450 x10E3/uL 241 266 266   Lipid Panel  Recent Labs    11/11/20 0828  CHOL 113  TRIG 59  LDLCALC 54  HDL  46    HEMOGLOBIN A1C Lab Results  Component Value Date   HGBA1C 6.2 (H) 12/05/2018   MPG 131 12/05/2018   TSH No results for input(s): TSH in the last 8760 hours.   Allergies:   Allergies  Allergen Reactions   Penicillins Other (See Comments)    Unknown  Has patient had a PCN reaction causing immediate rash, facial/tongue/throat swelling, SOB or lightheadedness with hypotension: {unknown Has patient had a PCN reaction causing severe rash involving mucus membranes or skin necrosis: unknown Has patient had a PCN reaction that required hospitalization {unknown Has patient had a PCN reaction occurring within the last 10 years: no If all of the above answers are "NO", then may proceed with Cephalosporin use.    Medications:    PRN Meds:. There are no discontinued medications. Current Outpatient Medications on File Prior to Visit  Medication Sig Dispense Refill   amLODipine (NORVASC) 5 MG tablet Take 1 tablet (5 mg total) by mouth daily. 90 tablet 3   aspirin 81 MG tablet Take 81 mg by mouth daily.     Calcium Carbonate-Vit D-Min (CALTRATE 600+D PLUS PO) Take by mouth. 1 tablet 2 times a day     camphor-menthol (SARNA) lotion Apply 1 application topically as needed for itching. 222 mL 0   cilostazol (PLETAL) 100 MG tablet Take 1 tablet (100 mg total) by mouth 2 (two) times daily. 180 tablet 1   ezetimibe (ZETIA) 10 MG tablet Take 1 tablet (10 mg total) by mouth daily. 90 tablet 1   furosemide (LASIX) 20 MG tablet TAKE ONE TABLET BY MOUTH EVERY MORNING AND TAKE ONE TABLET BY MOUTH EVERY EVENING 180 tablet 1   latanoprost (XALATAN) 0.005 % ophthalmic  solution Place 1 drop into both eyes at bedtime.      losartan (COZAAR) 100 MG tablet TAKE 1 TABLET EVERY DAY 90 tablet 1   Multiple Vitamins-Minerals (HEALTHY EYES/LUTEIN) TABS Take 1 tablet by mouth daily.     rosuvastatin (CRESTOR) 40 MG tablet TAKE 1 TABLET EVERY DAY (DISCONTINUE PRAVASTATIN) 90 tablet 0   timolol (BETIMOL) 0.5 % ophthalmic solution Place 1 drop into both eyes every morning.      No current facility-administered medications on file prior to visit.     Cardiac Studies:   Lower Extremity Doppler  [06/16/2016]:  No hemodynamically significant stenoses are identified in the right lower extremity arterial system. There is severe diffuse mixed plaque throughout. Moderate velocity increase at the left mid superficial femoral artery suggesting >50% stenosis. There is severe diffuse mixed plaque througout. This exam reveals severely decreased perfusion of the right lower extremity with RABI 0.46 and moderately decreased perfusion of the left lower extremity with LABI 0.69, noted at the post tibial artery level.  Peripheral arteriogram 07/28/2016: Moderate diffuse SFA disease. Right below knee severe diffuse disease with PT run-off. Left PT and Peroneal run-off, diffuse disease  Carotid Doppler   08/12/2017: Mild stenosis in the right internal carotid artery (1-15%). Minimal stenosis in the right common carotid artery (<50%). Antegrade right vertebral artery flow. Bidirectional left vertebral artery flow. May suggest proximal left subclavian stenosis. Compared to the study done on 08/12/2016, right ICA stenosis of less than 50% no longer present. Further studies when clinically indicated.  Remote outpatient cardiac telemetry 11/29/2019 for 1 week: 08/31/2019 through 12/04/2019: Predominant rhythm is normal sinus rhythm.  Minimum heart rate 48, maximum heart rate 131 and average heart rate 67 bpm. Ventricular ectopy 309 22 with 11 ventricular pairs and  0 ventricular tachycardia  runs. 13,034 PACs.  5 supraventricular runs, longest 13 beats.  No atrial fibrillation.  There were 0 patient activated events.  Echocardiogram 01/08/2020: Left ventricle cavity is normal in size. Moderate concentric hypertrophy  of the left ventricle. Normal global wall motion. Normal LV systolic  function with EF 61%. Doppler evidence of grade I (impaired) diastolic  dysfunction, normal LAP.  Trileaflet aortic valve. Moderate calcification. Moderate aortic stenosis.  Aortic valve mean gradient of 35 mmHg, Vmax of 4.2 m/s. Calculated aortic  valve area by continuity equation is 1.7 cm. Moderate (Grade III) aortic  regurgitation.  Mild tricuspid regurgitation. Estimated pulmonary artery systolic pressure  is 23 mmHg.  Compared to previous study in 2018, there is progression of aortic  stenosis severity (MG up from 17 to 35 mmHg).  Lexiscan Tetrofosmin stress test 12/20/2019: No previous exam available for comparison. Lexiscan nuclear stress test performed using 1-day protocol. Stress EKG is non-diagnostic, as this is pharmacological stress test. Rest and stress EKG shows sinus rhythm, RBBB, T wave inversion inferolateral leads, occasional PAC. SPECT images show small sized, mild intensity, mildly reversible perfusion defect in mid to basal inferior myocardium. Stress LVEF calculated 44%, although visually appears normal. No regional abnormalities of myocardial thickening noted. Low risk study.   EKG:  EKG 04/25/2021: Normal sinus rhythm at rate of 69 bpm, left axis deviation, left anterior fascicular block.  Right bundle branch block.  LVH.  Single PAC.  EKG 10/23/2020: Normal sinus rhythm at rate of 61 bpm, left atrial enlargement, normal axis, normal axis.  Right bundle branch block.  LVH with repolarization abnormality, cannot exclude inferior lateral ischemia.   Assessment     ICD-10-CM   1. Essential hypertension  I10 EKG 12-Lead    2. Moderate aortic regurgitation  I35.1 PCV  ECHOCARDIOGRAM COMPLETE    3. PAOD (peripheral arterial occlusive disease) (HCC)  I77.9     4. Hypercholesteremia  E78.00     5. Stage 3a chronic kidney disease (HCC)  N18.31      There are no discontinued medications. No orders of the defined types were placed in this encounter.  Orders Placed This Encounter  Procedures   EKG 12-Lead   PCV ECHOCARDIOGRAM COMPLETE    Standing Status:   Future    Standing Expiration Date:   04/25/2022    Recommendations:   Clayson "Warner Mccreedy"  Nee is a 80 y.o. 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  The patient presents today for 6 month follow up for hypertension, peripheral arterial disease and hyperlipidemia. Overall he is feeling well and has no specific complaints except for chronic symptoms of claudication in bilateral calves.  His physical examination has not changed with absent pedal pulses, symptoms of PAD being stable, continue medical therapy especially in view of chronic stage IIIb kidney disease.  His risk factors are now well controlled, renal function has remained stable over the past 2 years, blood pressure is well controlled, lipids are at goal. He is aortic regurgitation murmur and aortic stenosis murmur appears to be much more prominent today.  I would like to repeat his echocardiogram.  Otherwise he is stable from cardiac standpoint, I will see him back in a year or sooner if problems.  I encouraged him to walk  on a regular basis.   Adrian Prows, MD, [facility] 04/25/2021, 11:52 AM Office: [phone]

## 2021-05-02 ENCOUNTER — Encounter: Payer: Self-pay | Admitting: Family

## 2021-05-02 ENCOUNTER — Other Ambulatory Visit: Payer: Self-pay

## 2021-05-02 ENCOUNTER — Ambulatory Visit (INDEPENDENT_AMBULATORY_CARE_PROVIDER_SITE_OTHER): Payer: Medicare HMO | Admitting: Family

## 2021-05-02 DIAGNOSIS — Z0000 Encounter for general adult medical examination without abnormal findings: Secondary | ICD-10-CM

## 2021-05-02 NOTE — Progress Notes (Signed)
This service is provided via telemedicine  No vital signs collected/recorded due to the encounter was a telemedicine visit.   Location of patient (ex: home, work):  Home.  Patient consents to a telephone visit:  Yes  Location of the provider (ex: office, home):  [employer].   Name of any referring provider:  Wardell Honour, MD   Names of all persons participating in the telemedicine service and their role in the encounter:  Patient, Heriberto Antigua, Box, [place], Webb Silversmith, NP.    Time spent on call: 8 minutes spent on the phone with Medical Assistant.     Subjective:   [name] is a 80 y.o. male who presents for Medicare Annual/Subsequent preventive examination.  Review of Systems     Cardiac Risk Factors include: advanced age (>47men, >74 women);hypertension;male gender;smoking/ tobacco exposure     Objective:    There were no vitals filed for this visit. There is no height or weight on file to calculate BMI.  Advanced Directives 05/02/2021 07/22/2020 04/30/2020 03/21/2020 11/29/2019 11/28/2019 11/20/2019  Does Patient Have a Medical Advance Directive? No No No No No No No  Would patient like information on creating a medical advance directive? No - Patient declined No - Patient declined No - Patient declined Yes (ED - Information included in AVS) No - Patient declined No - Patient declined No - Patient declined    Current Medications (verified) Outpatient Encounter Medications as of 05/02/2021  Medication Sig   amLODipine (NORVASC) 5 MG tablet Take 1 tablet (5 mg total) by mouth daily.   aspirin 81 MG tablet Take 81 mg by mouth daily.   Calcium Carbonate-Vit D-Min (CALTRATE 600+D PLUS PO) Take by mouth. 1 tablet 2 times a day   camphor-menthol (SARNA) lotion Apply 1 application topically as needed for itching.   cilostazol (PLETAL) 100 MG tablet Take 1 tablet (100 mg total) by mouth 2 (two) times daily.   ezetimibe (ZETIA) 10 MG tablet Take 1 tablet  (10 mg total) by mouth daily.   furosemide (LASIX) 20 MG tablet TAKE ONE TABLET BY MOUTH EVERY MORNING AND TAKE ONE TABLET BY MOUTH EVERY EVENING   latanoprost (XALATAN) 0.005 % ophthalmic solution Place 1 drop into both eyes at bedtime.    losartan (COZAAR) 100 MG tablet TAKE 1 TABLET EVERY DAY   Multiple Vitamins-Minerals (HEALTHY EYES/LUTEIN) TABS Take 1 tablet by mouth daily.   rosuvastatin (CRESTOR) 40 MG tablet TAKE 1 TABLET EVERY DAY (DISCONTINUE PRAVASTATIN)   timolol (BETIMOL) 0.5 % ophthalmic solution Place 1 drop into both eyes every morning.    No facility-administered encounter medications on file as of 05/02/2021.    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)    Microscopic hematuria    Nondependent cannabis abuse    Pure hypercholesterolemia  Unspecified late effects of cerebrovascular disease    Unspecified late effects of cerebrovascular disease    Unspecified vitamin D deficiency    Unspecified vitamin D deficiency    Past Surgical History:  Procedure Laterality Date   CATARACT EXTRACTION     CORONARY STENT PLACEMENT     2008   EYE SURGERY     left eye,    cornea repair   PERIPHERAL VASCULAR CATHETERIZATION N/A 07/28/2016   Procedure: Lower Extremity Angiography;  Surgeon: Adrian Prows, MD;  Location: [facility];  Service:  Cardiovascular;  Laterality: N/A;   Family History  Problem Relation Age of Onset   Cancer Father    Social History   Socioeconomic History   Marital status: Divorced    Spouse name: Not on file   Number of children: 2   Years of education: Not on file   Highest education level: Not on file  Occupational History   Not on file  Tobacco Use   Smoking status: Former    Packs/day: 0.50    Years: 13.00    Pack years: 6.50    Types: Cigarettes    Quit date: 03/09/1971    Years since quitting: 50.1   Smokeless tobacco: Never  Vaping Use   Vaping Use: Never used  Substance and Sexual Activity   Alcohol use: No    Alcohol/week: 0.0 standard drinks   Drug use: No   Sexual activity: Not on file  Other Topics Concern   Not on file  Social History Narrative   Not on file   Social Determinants of Health   Financial Resource Strain: Not on file  Food Insecurity: Not on file  Transportation Needs: Not on file  Physical Activity: Not on file  Stress: Not on file  Social Connections: Not on file    Tobacco Counseling Counseling given: Not Answered   Clinical Intake:  Pre-visit preparation completed: No  Pain : No/denies pain     BMI - recorded: 22.23 Nutritional Status: BMI of 19-24  Normal Nutritional Risks: None Diabetes: No  How often do you need to have someone help you when you read instructions, pamphlets, or other written materials from your doctor or pharmacy?: 1 - Never What is the last grade level you completed in school?: 1 year college  Diabetic?No   Interpreter Needed?: No  Information entered by :: Maritta Kief,FNP-C   Activities of Daily Living In your present state of health, do you have any difficulty performing the following activities: 05/02/2021  Hearing? N  Vision? N  Difficulty concentrating or making decisions? N  Walking or climbing stairs? N  Dressing or bathing? N  Doing errands, shopping? N  Preparing Food and eating ? N  Using  the Toilet? N  In the past six months, have you accidently leaked urine? N  Do you have problems with loss of bowel control? N  Managing your Medications? N  Managing your Finances? N  Housekeeping or managing your Housekeeping? N  Some recent data might be hidden    Patient Care Team: Wardell Honour, MD as PCP - General (Family Medicine) Adrian Prows, MD as Consulting Physician (Cardiology) Clent Jacks, MD as Consulting Physician (Ophthalmology) Mal Misty, MD (Inactive) as Consulting Physician (Vascular Surgery) Garvin Fila, MD as Consulting Physician (Neurology) Hayden Pedro, MD as Consulting Physician (Ophthalmology)  Indicate any recent Medical Services you may have received from other than [facility] providers in the past year (date may be  approximate).     Assessment:   This is a routine wellness examination for [name].  Hearing/Vision screen Hearing Screening - Comments:: No Hearing Concerns. Patient doesn't wear hearing aids.  Vision Screening - Comments:: No Vision Concerns. Patient wears prescription glasses. Patient last eye exam was September 2022.  Dietary issues and exercise activities discussed: Current Exercise Habits: Home exercise routine, Type of exercise: Other - see comments (Bike), Time (Minutes): 15, Frequency (Times/Week): 3, Weekly Exercise (Minutes/Week): 45, Intensity: Mild, Exercise limited by: None identified   Goals Addressed             This Visit's Progress    Maintain lifestyle   On track    Patient will maintain Lifestyle       Depression Screen PHQ 2/9 Scores 05/02/2021 07/22/2020 04/30/2020 03/21/2020 08/24/2019 07/20/2019 04/25/2019  PHQ - 2 Score 0 0 0 0 0 0 0    Fall Risk Fall Risk  05/02/2021 07/22/2020 04/30/2020 03/21/2020 11/20/2019  Falls in the past year? 0 0 0 0 0  Number falls in past yr: 0 0 0 0 0  Injury with Fall? 0 0 0 0 0  Risk for fall due to : No Fall Risks - - - -  Follow up Falls evaluation completed - - -  -    FALL RISK PREVENTION PERTAINING TO THE HOME:  Any stairs in or around the home? Yes  If so, are there any without handrails? No  Home free of loose throw rugs in walkways, pet beds, electrical cords, etc? No  Adequate lighting in your home to reduce risk of falls? No   ASSISTIVE DEVICES UTILIZED TO PREVENT FALLS:  Life alert? No  Use of a cane, walker or w/c? No  Grab bars in the bathroom? No  Shower chair or bench in shower? No  Elevated toilet seat or a handicapped toilet? No   TIMED UP AND GO:  Was the test performed? No .  Length of time to ambulate 10 feet: N/A  sec.   Gait slow and steady without use of assistive device  Cognitive Function: MMSE - Mini Mental State Exam 04/18/2018 03/10/2017 03/26/2016  Orientation to time 5 5 5   Orientation to Place 4 5 5   Registration 3 3 3   Attention/ Calculation 5 5 5   Recall 1 2 2   Language- name 2 objects 2 2 2   Language- repeat 1 1 1   Language- follow 3 step command 3 3 2   Language- read & follow direction 1 1 1   Write a sentence 1 1 1   Copy design 1 1 1   Total score 27 29 28      6CIT Screen 05/02/2021 04/30/2020 04/25/2019  What Year? 0 points 0 points 0 points  What month? 0 points 0 points 0 points  What time? 0 points 0 points 0 points  Count back from 20 0 points 0 points 0 points  Months in reverse 0 points 0 points 0 points  Repeat phrase 4 points 6 points -  Total Score 4 6 -    Immunizations Immunization History  Administered Date(s) Administered   Fluad Quad(high Dose 65+) 04/03/2019, 07/22/2020   Influenza Split 04/29/2010   Influenza, High Dose Seasonal PF 07/22/2017, 04/18/2018   Influenza,inj,Quad PF,6+ Mos 06/05/2013, 07/09/2014, 06/14/2015   PFIZER Comirnaty(Gray Top)Covid-19 Tri-Sucrose Vaccine 10/04/2020   PFIZER(Purple Top)SARS-COV-2 Vaccination 03/02/2020, 04/02/2020   Pneumococcal Conjugate-13 10/02/2013   Pneumococcal Polysaccharide-23 11/05/2011   Tdap 04/25/2019    TDAP status: Up to  date  Flu Vaccine status: Due, Education has been provided regarding the importance of this vaccine. Advised may receive this vaccine at local pharmacy or Health Dept. Aware to provide a copy of the vaccination record if obtained from local pharmacy or Health Dept. Verbalized acceptance and understanding.  Pneumococcal vaccine status: Due, Education has been provided regarding the importance of this vaccine. Advised may receive this vaccine at local pharmacy or Health Dept. Aware to provide a copy of the vaccination record if obtained from local pharmacy or Health Dept. Verbalized acceptance and understanding.  Covid-19 vaccine status: Information provided on how to obtain vaccines.   Qualifies for Shingles Vaccine? Yes  No  Zostavax completed No   Shingrix Completed?: No.    Education has been provided regarding the importance of this vaccine. Patient has been advised to call insurance company to determine out of pocket expense if they have not yet received this vaccine. Advised may also receive vaccine at local pharmacy or Health Dept. Verbalized acceptance and understanding.  Screening Tests Health Maintenance  Topic Date Due   Zoster Vaccines- Shingrix (1 of 2) Never done   COVID-19 Vaccine (4 - Booster for Pfizer series) 12/27/2020   INFLUENZA VACCINE  03/03/2021   TETANUS/TDAP  04/24/2029   HPV VACCINES  Aged Out    Health Maintenance  Health Maintenance Due  Topic Date Due   Zoster Vaccines- Shingrix (1 of 2) Never done   COVID-19 Vaccine (4 - Booster for Pfizer series) 12/27/2020   INFLUENZA VACCINE  03/03/2021    Colorectal cancer screening: No longer required.   Lung Cancer Screening: (Low Dose CT Chest recommended if Age 55-80 years, 30 pack-year currently smoking OR have quit w/in 15years.) does not qualify.   Lung Cancer Screening Referral: No   Additional Screening:  Hepatitis C Screening: does not qualify; Completed No  Vision Screening: Recommended annual  ophthalmology exams for early detection of glaucoma and other disorders of the eye. Is the patient up to date with their annual eye exam?  Yes  Who is the provider or what is the name of the office in which the patient attends annual eye exams? Dr.Groat  If pt is not established with a provider, would they like to be referred to a provider to establish care? No .   Dental Screening: Recommended annual dental exams for proper oral hygiene  Community Resource Referral / Chronic Care Management: CRR required this visit?  No   CCM required this visit?  No      Plan:     I have personally reviewed and noted the following in the patient's chart:   Medical and social history Use of alcohol, tobacco or illicit drugs  Current medications and supplements including opioid prescriptions. Patient is not currently taking opioid prescriptions. Functional ability and status Nutritional status Physical activity Advanced directives List of other physicians Hospitalizations, surgeries, and ER visits in previous 12 months Vitals Screenings to include cognitive, depression, and falls Referrals and appointments  In addition, I have reviewed and discussed with patient certain preventive protocols, quality metrics, and best practice recommendations. A written personalized care plan for preventive services as well as general preventive health recommendations were provided to patient.     Sandrea Hughs, NP   05/02/2021   Nurse Notes: Advised to get Influenza ,Shingrix and 2nd COVID-19 booster vaccine.

## 2021-05-02 NOTE — Patient Instructions (Signed)
[name] , Thank you for taking time to come for your Medicare Wellness Visit. I appreciate your ongoing commitment to your health goals. Please review the following plan we discussed and let me know if I can assist you in the future.   Screening recommendations/referrals: Colonoscopy: N/A  Recommended yearly ophthalmology/optometry visit for glaucoma screening and checkup Recommended yearly dental visit for hygiene and checkup  Vaccinations: Influenza vaccine : Due  Pneumococcal vaccine  Tdap vaccine: Up to date  Shingles vaccine: Due     Advanced directives: No   Conditions/risks identified: Advance age male > 32 yrs,Hypertension,Male Gender and Hx of smoking   Next appointment: 1 year   Preventive Care 80 Years and Older, Male Preventive care refers to lifestyle choices and visits with your health care provider that can promote health and wellness. What does preventive care include? A yearly physical exam. This is also called an annual well check. Dental exams once or twice a year. Routine eye exams. Ask your health care provider how often you should have your eyes checked. Personal lifestyle choices, including: Daily care of your teeth and gums. Regular physical activity. Eating a healthy diet. Avoiding tobacco and drug use. Limiting alcohol use. Practicing safe sex. Taking low doses of aspirin every day. Taking vitamin and mineral supplements as recommended by your health care provider. What happens during an annual well check? The services and screenings done by your health care provider during your annual well check will depend on your age, overall health, lifestyle risk factors, and family history of disease. Counseling  Your health care provider may ask you questions about your: Alcohol use. Tobacco use. Drug use. Emotional well-being. Home and relationship well-being. Sexual activity. Eating habits. History of falls. Memory and ability to understand  (cognition). Work and work Statistician. Screening  You may have the following tests or measurements: Height, weight, and BMI. Blood pressure. Lipid and cholesterol levels. These may be checked every 5 years, or more frequently if you are over 66 years old. Skin check. Lung cancer screening. You may have this screening every year starting at age 78 if you have a 30-pack-year history of smoking and currently smoke or have quit within the past 15 years. Fecal occult blood test (FOBT) of the stool. You may have this test every year starting at age 60. Flexible sigmoidoscopy or colonoscopy. You may have a sigmoidoscopy every 5 years or a colonoscopy every 10 years starting at age 79. Prostate cancer screening. Recommendations will vary depending on your family history and other risks. Hepatitis C blood test. Hepatitis B blood test. Sexually transmitted disease (STD) testing. Diabetes screening. This is done by checking your blood sugar (glucose) after you have not eaten for a while (fasting). You may have this done every 1-3 years. Abdominal aortic aneurysm (AAA) screening. You may need this if you are a current or former smoker. Osteoporosis. You may be screened starting at age 16 if you are at high risk. Talk with your health care provider about your test results, treatment options, and if necessary, the need for more tests. Vaccines  Your health care provider may recommend certain vaccines, such as: Influenza vaccine. This is recommended every year. Tetanus, diphtheria, and acellular pertussis (Tdap, Td) vaccine. You may need a Td booster every 10 years. Zoster vaccine. You may need this after age 43. Pneumococcal 13-valent conjugate (PCV13) vaccine. One dose is recommended after age 26. Pneumococcal polysaccharide (PPSV23) vaccine. One dose is recommended after age 63. Talk to your  health care provider about which screenings and vaccines you need and how often you need them. This  information is not intended to replace advice given to you by your health care provider. Make sure you discuss any questions you have with your health care provider. Document Released: 08/16/2015 Document Revised: 04/08/2016 Document Reviewed: 05/21/2015 Elsevier Interactive Patient Education  2017 [place] Prevention in the Home Falls can cause injuries. They can happen to people of all ages. There are many things you can do to make your home safe and to help prevent falls. What can I do on the outside of my home? Regularly fix the edges of walkways and driveways and fix any cracks. Remove anything that might make you trip as you walk through a door, such as a raised step or threshold. Trim any bushes or trees on the path to your home. Use bright outdoor lighting. Clear any walking paths of anything that might make someone trip, such as rocks or tools. Regularly check to see if handrails are loose or broken. Make sure that both sides of any steps have handrails. Any raised decks and porches should have guardrails on the edges. Have any leaves, snow, or ice cleared regularly. Use sand or salt on walking paths during winter. Clean up any spills in your garage right away. This includes oil or grease spills. What can I do in the bathroom? Use night lights. Install grab bars by the toilet and in the tub and shower. Do not use towel bars as grab bars. Use non-skid mats or decals in the tub or shower. If you need to sit down in the shower, use a plastic, non-slip stool. Keep the floor dry. Clean up any water that spills on the floor as soon as it happens. Remove soap buildup in the tub or shower regularly. Attach bath mats securely with double-sided non-slip rug tape. Do not have throw rugs and other things on the floor that can make you trip. What can I do in the bedroom? Use night lights. Make sure that you have a light by your bed that is easy to reach. Do not use any sheets or  blankets that are too big for your bed. They should not hang down onto the floor. Have a firm chair that has side arms. You can use this for support while you get dressed. Do not have throw rugs and other things on the floor that can make you trip. What can I do in the kitchen? Clean up any spills right away. Avoid walking on wet floors. Keep items that you use a lot in easy-to-reach places. If you need to reach something above you, use a strong step stool that has a grab bar. Keep electrical cords out of the way. Do not use floor polish or wax that makes floors slippery. If you must use wax, use non-skid floor wax. Do not have throw rugs and other things on the floor that can make you trip. What can I do with my stairs? Do not leave any items on the stairs. Make sure that there are handrails on both sides of the stairs and use them. Fix handrails that are broken or loose. Make sure that handrails are as long as the stairways. Check any carpeting to make sure that it is firmly attached to the stairs. Fix any carpet that is loose or worn. Avoid having throw rugs at the top or bottom of the stairs. If you do have throw rugs, attach them  to the floor with carpet tape. Make sure that you have a light switch at the top of the stairs and the bottom of the stairs. If you do not have them, ask someone to add them for you. What else can I do to help prevent falls? Wear shoes that: Do not have high heels. Have rubber bottoms. Are comfortable and fit you well. Are closed at the toe. Do not wear sandals. If you use a stepladder: Make sure that it is fully opened. Do not climb a closed stepladder. Make sure that both sides of the stepladder are locked into place. Ask someone to hold it for you, if possible. Clearly mark and make sure that you can see: Any grab bars or handrails. First and last steps. Where the edge of each step is. Use tools that help you move around (mobility aids) if they are  needed. These include: Canes. Walkers. Scooters. Crutches. Turn on the lights when you go into a dark area. Replace any light bulbs as soon as they burn out. Set up your furniture so you have a clear path. Avoid moving your furniture around. If any of your floors are uneven, fix them. If there are any pets around you, be aware of where they are. Review your medicines with your doctor. Some medicines can make you feel dizzy. This can increase your chance of falling. Ask your doctor what other things that you can do to help prevent falls. This information is not intended to replace advice given to you by your health care provider. Make sure you discuss any questions you have with your health care provider. Document Released: 05/16/2009 Document Revised: 12/26/2015 Document Reviewed: 08/24/2014 Elsevier Interactive Patient Education  2017 [employer].

## 2021-05-06 ENCOUNTER — Other Ambulatory Visit: Payer: Self-pay

## 2021-05-06 ENCOUNTER — Other Ambulatory Visit: Payer: Self-pay | Admitting: *Deleted

## 2021-05-06 ENCOUNTER — Encounter: Payer: Self-pay | Admitting: Family Medicine

## 2021-05-06 ENCOUNTER — Ambulatory Visit (INDEPENDENT_AMBULATORY_CARE_PROVIDER_SITE_OTHER): Payer: Medicare HMO | Admitting: Family Medicine

## 2021-05-06 VITALS — BP 150/100 | HR 103 | Temp 97.3°F | Ht 71.0 in | Wt 158.6 lb

## 2021-05-06 DIAGNOSIS — Z23 Encounter for immunization: Secondary | ICD-10-CM | POA: Diagnosis not present

## 2021-05-06 DIAGNOSIS — N1832 Chronic kidney disease, stage 3b: Secondary | ICD-10-CM | POA: Diagnosis not present

## 2021-05-06 DIAGNOSIS — L853 Xerosis cutis: Secondary | ICD-10-CM | POA: Diagnosis not present

## 2021-05-06 DIAGNOSIS — I5032 Chronic diastolic (congestive) heart failure: Secondary | ICD-10-CM | POA: Diagnosis not present

## 2021-05-06 DIAGNOSIS — I10 Essential (primary) hypertension: Secondary | ICD-10-CM

## 2021-05-06 DIAGNOSIS — G3184 Mild cognitive impairment, so stated: Secondary | ICD-10-CM

## 2021-05-06 DIAGNOSIS — I350 Nonrheumatic aortic (valve) stenosis: Secondary | ICD-10-CM | POA: Diagnosis not present

## 2021-05-06 MED ORDER — TRIAMCINOLONE ACETONIDE 0.1 % EX OINT: TOPICAL_OINTMENT | Freq: Two times a day (BID) | CUTANEOUS | 1 refills | Status: DC

## 2021-05-06 MED ORDER — TRIAMCINOLONE ACETONIDE 0.1 % EX LOTN: TOPICAL_LOTION | Freq: Two times a day (BID) | CUTANEOUS | Status: DC

## 2021-05-06 NOTE — Telephone Encounter (Signed)
Transmission Failed to pharmacy.  Tried Twice and Failed.  Printed Rx and had Dr. Sabra Heck sign and faxed to pharmacy.

## 2021-05-06 NOTE — Patient Instructions (Signed)
Continue same meds Apply lotion twice a day until symptoms improve; then apply after bathing once a day

## 2021-05-06 NOTE — Progress Notes (Signed)
Provider:  Alain Honey, MD  Careteam: Patient Care Team: Wardell Honour, MD as PCP - General (Family Medicine) Adrian Prows, MD as Consulting Physician (Cardiology) Clent Jacks, MD as Consulting Physician (Ophthalmology) Mal Misty, MD (Inactive) as Consulting Physician (Vascular Surgery) Garvin Fila, MD as Consulting Physician (Neurology) Hayden Pedro, MD as Consulting Physician (Ophthalmology)  PLACE OF SERVICE:  [place]  Advanced Directive information    Allergies  Allergen Reactions   Penicillins Other (See Comments)    Unknown  Has patient had a PCN reaction causing immediate rash, facial/tongue/throat swelling, SOB or lightheadedness with hypotension: {unknown Has patient had a PCN reaction causing severe rash involving mucus membranes or skin necrosis: unknown Has patient had a PCN reaction that required hospitalization {unknown Has patient had a PCN reaction occurring within the last 10 years: no If all of the above answers are "NO", then may proceed with Cephalosporin use.    Chief Complaint  Patient presents with   Acute Visit    Patient presents today for dry skin on stomach and left leg. He reports itching and area darken in color. He was prescribed Sarna lotion at last visit and reports its not helping.   Quality Metric Gaps    Zoster, Flu and COVID booster vaccines     HPI: Patient is a 80 y.o. male .  Patient is here for follow-up of itching and dry skin on his stomach buttock and left leg.  At last visit I gave him Sarna lotion which is a menthol lotion which did not help. Regarding his chronic diastolic failure.  Has seen cardiologist recently.  He has no edema shortness of breath or other symptoms. Memory seems to be intact per daughter who accompanies him today. Urologist have given him a good report as regards elevated PSA and enlarged prostate.  Review of Systems:  Review of Systems  Constitutional: Negative.    Respiratory: Negative.    Cardiovascular: Negative.   Gastrointestinal: Negative.   Genitourinary: Negative.   Musculoskeletal: Negative.    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 ([place]) 12/11/2018   Hyperlipidemia    Hypertension    Hypertensive kidney disease, benign    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)    Microscopic hematuria    Nondependent cannabis abuse    Pure hypercholesterolemia    Unspecified late effects of cerebrovascular disease    Unspecified late effects of cerebrovascular disease    Unspecified vitamin D deficiency    Unspecified vitamin D deficiency    Past Surgical History:  Procedure Laterality Date   CATARACT EXTRACTION     CORONARY STENT PLACEMENT     2008   EYE SURGERY     left eye,    cornea repair   PERIPHERAL VASCULAR CATHETERIZATION N/A 07/28/2016   Procedure: Lower Extremity Angiography;  Surgeon: Adrian Prows, MD;  Location: [facility];  Service: Cardiovascular;  Laterality: N/A;   Social History:   reports that he quit smoking about  50 years ago. His smoking use included cigarettes. He has a 6.50 pack-year smoking history. He has never used smokeless tobacco. He reports that he does not drink alcohol and does not use drugs.  Family History  Problem Relation Age of Onset   Cancer Father     Medications: Patient's Medications  New Prescriptions   No medications on file  Previous Medications   AMLODIPINE (NORVASC) 5 MG TABLET    Take 1 tablet (5 mg total) by mouth daily.    ASPIRIN 81 MG TABLET    Take 81 mg by mouth daily.   CALCIUM CARBONATE-VIT D-MIN (CALTRATE 600+D PLUS PO)    Take by mouth. 1 tablet 2 times a day   CAMPHOR-MENTHOL (SARNA) LOTION    Apply 1 application topically as needed for itching.   CILOSTAZOL (PLETAL) 100 MG TABLET    Take 1 tablet (100 mg total) by mouth 2 (two) times daily.   EZETIMIBE (ZETIA) 10 MG TABLET    Take 1 tablet (10 mg total) by mouth daily.   FUROSEMIDE (LASIX) 20 MG TABLET    TAKE ONE TABLET BY MOUTH EVERY MORNING AND TAKE ONE TABLET BY MOUTH EVERY EVENING   LATANOPROST (XALATAN) 0.005 % OPHTHALMIC SOLUTION    Place 1 drop into both eyes at bedtime.    LOSARTAN (COZAAR) 100 MG TABLET    TAKE 1 TABLET EVERY DAY   MULTIPLE VITAMINS-MINERALS (HEALTHY EYES/LUTEIN) TABS    Take 1 tablet by mouth daily.   ROSUVASTATIN (CRESTOR) 40 MG TABLET    TAKE 1 TABLET EVERY DAY (DISCONTINUE PRAVASTATIN)   TIMOLOL (BETIMOL) 0.5 % OPHTHALMIC SOLUTION    Place 1 drop into both eyes every morning.   Modified Medications   No medications on file  Discontinued Medications   No medications on file    Physical Exam:  Vitals:   05/06/21 0934  BP: (!) 150/100  Pulse: (!) 103  Temp: (!) 97.3 F (36.3 C)  SpO2: 98%  Weight: 158 lb 9.6 oz (71.9 kg)  Height: 5\' 11"  (1.803 m)   Body mass index is 22.12 kg/m. Wt Readings from Last 3 Encounters:  05/06/21 158 lb 9.6 oz (71.9 kg)  04/25/21 160 lb 12.8 oz (72.9 kg)  12/25/20 159 lb 6.4 oz (72.3 kg)    Physical Exam Vitals and nursing note reviewed.  Constitutional:      Appearance: Normal appearance.  HENT:     Head: Normocephalic.  Cardiovascular:     Rate and Rhythm: Normal rate and regular rhythm.     Heart sounds: Murmur heard.  Pulmonary:     Effort: Pulmonary effort is normal.     Breath sounds: Normal breath sounds.  Skin:    Comments: Rash on lower abdomen below umbilicus left posterior leg and buttock.  Probably related to eczema  Neurological:     Mental Status: He  is alert.    Labs reviewed: Basic Metabolic Panel: Recent Labs    11/11/20 0828  NA 143  K 5.1  CL 105  CO2 21  GLUCOSE 105*  BUN 48*  CREATININE 1.98*  CALCIUM 9.6   Liver Function Tests: Recent Labs    11/11/20 0828  AST 26  ALT 17  ALKPHOS 82  BILITOT 0.2  PROT 7.6  ALBUMIN 4.5   No results for input(s): LIPASE, AMYLASE in the last 8760 hours. No results for input(s): AMMONIA in the last 8760 hours. CBC: Recent Labs    11/11/20 0828  WBC 5.6  HGB  11.5*  HCT 35.9*  MCV 90  PLT 241   Lipid Panel: Recent Labs    11/11/20 0828  CHOL 113  HDL 46  LDLCALC 54  TRIG 59   TSH: No results for input(s): TSH in the last 8760 hours. A1C: Lab Results  Component Value Date   HGBA1C 6.2 (H) 12/05/2018     Assessment/Plan  1. Dry skin dermatitis Will try steroid cream on rash to be used twice a day until symptoms are improved then go to once a day as maintenance - triamcinolone 0.1% oint-Cerave equivalent lotion 1:1 mixture; Apply topically 2 (two) times daily.  Dispense: 454 g; Refill: 1  2. Need for influenza vaccination  - Flu Vaccine QUAD High Dose(Fluad)  3. Nonrheumatic aortic valve stenosis Denies any chest pain but has had syncope in the past possibly related to aortic valve disease  4. Chronic diastolic heart failure ([place]) Well compensated.  Continues on furosemide as well as losartan and amlodipine  5. Stage 3b chronic kidney disease ([place]) Most recent creatinine is 1.98 with BUN of 48 and GFR of 34  6. Essential hypertension Blood pressure is a little elevated today at 150/100.  Daughter apparently monitors this at home and tells me it is not unusual for his pressure to go up somewhat he visits doctors offices  7. Mild cognitive impairment with memory loss This is stable.  There are no complaints or issues today   Alain Honey, MD [place] [phone]

## 2021-05-23 ENCOUNTER — Other Ambulatory Visit: Payer: Self-pay | Admitting: *Deleted

## 2021-05-23 DIAGNOSIS — L853 Xerosis cutis: Secondary | ICD-10-CM

## 2021-05-23 MED ORDER — TRIAMCINOLONE ACETONIDE 0.1 % EX OINT: TOPICAL_OINTMENT | Freq: Two times a day (BID) | CUTANEOUS | 1 refills | Status: DC

## 2021-05-23 NOTE — Telephone Encounter (Signed)
Patient stated that [facility] does not carry the Triamcinolone and requesting it to be sent to [facility] instead.  Rx sent.

## 2021-05-27 ENCOUNTER — Ambulatory Visit: Payer: Medicare HMO

## 2021-05-27 ENCOUNTER — Other Ambulatory Visit: Payer: Self-pay

## 2021-05-27 DIAGNOSIS — I351 Nonrheumatic aortic (valve) insufficiency: Secondary | ICD-10-CM | POA: Diagnosis not present

## 2021-06-02 NOTE — Progress Notes (Signed)
Let patient's caregiver know that the echocardiogram is fairly stable.

## 2021-06-03 ENCOUNTER — Other Ambulatory Visit: Payer: Self-pay

## 2021-06-03 ENCOUNTER — Encounter: Payer: Self-pay | Admitting: Family Medicine

## 2021-06-03 ENCOUNTER — Ambulatory Visit (INDEPENDENT_AMBULATORY_CARE_PROVIDER_SITE_OTHER): Payer: Medicare HMO | Admitting: Family Medicine

## 2021-06-03 DIAGNOSIS — J309 Allergic rhinitis, unspecified: Secondary | ICD-10-CM | POA: Insufficient documentation

## 2021-06-03 DIAGNOSIS — J302 Other seasonal allergic rhinitis: Secondary | ICD-10-CM | POA: Diagnosis not present

## 2021-06-03 NOTE — Progress Notes (Signed)
Provider:  Alain Honey, MD  Careteam: Patient Care Team: Wardell Honour, MD as PCP - General (Family Medicine) Adrian Prows, MD as Consulting Physician (Cardiology) Clent Jacks, MD as Consulting Physician (Ophthalmology) Mal Misty, MD (Inactive) as Consulting Physician (Vascular Surgery) Garvin Fila, MD as Consulting Physician (Neurology) Hayden Pedro, MD as Consulting Physician (Ophthalmology)  PLACE OF SERVICE:  [place]  Advanced Directive information    Allergies  Allergen Reactions   Penicillins Other (See Comments)    Unknown  Has patient had a PCN reaction causing immediate rash, facial/tongue/throat swelling, SOB or lightheadedness with hypotension: {unknown Has patient had a PCN reaction causing severe rash involving mucus membranes or skin necrosis: unknown Has patient had a PCN reaction that required hospitalization {unknown Has patient had a PCN reaction occurring within the last 10 years: no If all of the above answers are "NO", then may proceed with Cephalosporin use.    Chief Complaint  Patient presents with   Acute Visit    Patient presents today for a cough and runny nose for 2 days now. He reports having a COVID test done and the results was negative. Patient had his Flu vaccine on 05/06/2021.     HPI: Patient is a 80 y.o. male patient is brought in by his daughter today with chief complaints of cough and rhinorrhea and sneezing.  He has not had any fever.  Cough is described as nonproductive.  Had a negative COVID test 2 days ago and has also had flu shot for this year.  Review of Systems:  Review of Systems  HENT:  Positive for congestion.   Respiratory:  Positive for cough.   Musculoskeletal: Negative.   Neurological: Negative.   Psychiatric/Behavioral: Negative.    All other systems reviewed and are negative.  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 ([place]) 12/11/2018   Hyperlipidemia    Hypertension    Hypertensive kidney disease, benign    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)    Microscopic hematuria    Nondependent cannabis abuse    Pure hypercholesterolemia    Unspecified late effects of cerebrovascular disease    Unspecified late effects of cerebrovascular disease    Unspecified vitamin D deficiency    Unspecified vitamin D deficiency    Past Surgical History:  Procedure Laterality Date   CATARACT EXTRACTION     CORONARY STENT PLACEMENT     2008   EYE SURGERY     left eye,    cornea repair   PERIPHERAL VASCULAR CATHETERIZATION N/A 07/28/2016   Procedure: Lower Extremity Angiography;  Surgeon: Adrian Prows, MD;  Location: [facility];  Service: Cardiovascular;  Laterality: N/A;   Social History:   reports that he quit smoking about 50 years ago. His smoking use included cigarettes. He has a 6.50 pack-year smoking history. He has never used smokeless tobacco. He reports that he does not drink alcohol and does not use drugs.  Family History  Problem Relation  Age of Onset   Cancer Father     Medications: Patient's Medications  New Prescriptions   No medications on file  Previous Medications   AMLODIPINE (NORVASC) 5 MG TABLET    Take 1 tablet (5 mg total) by mouth daily.   ASPIRIN 81 MG TABLET    Take 81 mg by mouth daily.   CALCIUM CARBONATE-VIT D-MIN (CALTRATE 600+D PLUS PO)    Take by mouth. 1 tablet 2 times a day   CAMPHOR-MENTHOL (SARNA) LOTION    Apply 1 application topically as needed for itching.   CILOSTAZOL  (PLETAL) 100 MG TABLET    Take 1 tablet (100 mg total) by mouth 2 (two) times daily.   EZETIMIBE (ZETIA) 10 MG TABLET    Take 1 tablet (10 mg total) by mouth daily.   FUROSEMIDE (LASIX) 20 MG TABLET    TAKE ONE TABLET BY MOUTH EVERY MORNING AND TAKE ONE TABLET BY MOUTH EVERY EVENING   LATANOPROST (XALATAN) 0.005 % OPHTHALMIC SOLUTION    Place 1 drop into both eyes at bedtime.    LOSARTAN (COZAAR) 100 MG TABLET    TAKE 1 TABLET EVERY DAY   MULTIPLE VITAMINS-MINERALS (HEALTHY EYES/LUTEIN) TABS    Take 1 tablet by mouth daily.   ROSUVASTATIN (CRESTOR) 40 MG TABLET    TAKE 1 TABLET EVERY DAY (DISCONTINUE PRAVASTATIN)   TIMOLOL (BETIMOL) 0.5 % OPHTHALMIC SOLUTION    Place 1 drop into both eyes every morning.    TRIAMCINOLONE 0.1% OINT-CERAVE EQUIVALENT LOTION 1:1 MIXTURE    Apply topically 2 (two) times daily.  Modified Medications   No medications on file  Discontinued Medications   No medications on file    Physical Exam:  Vitals:   06/03/21 1103  BP: 130/60  Pulse: 85  Temp: (!) 96.1 F (35.6 C)  SpO2: 98%  Weight: 156 lb 9.6 oz (71 kg)  Height: 5\' 11"  (1.803 m)   Body mass index is 21.84 kg/m. Wt Readings from Last 3 Encounters:  06/03/21 156 lb 9.6 oz (71 kg)  05/06/21 158 lb 9.6 oz (71.9 kg)  04/25/21 160 lb 12.8 oz (72.9 kg)    Physical Exam Vitals and nursing note reviewed.  Constitutional:      Appearance: Normal appearance.  HENT:     Head: Normocephalic.     Right Ear: Tympanic membrane normal.     Left Ear: Tympanic membrane normal.  Cardiovascular:     Rate and Rhythm: Normal rate and regular rhythm.  Pulmonary:     Effort: Pulmonary effort is normal.     Breath sounds: Normal breath sounds.  Neurological:     General: No focal deficit present.     Mental Status: He is alert and oriented to person, place, and time.    Labs reviewed: Basic Metabolic Panel: Recent Labs    11/11/20 0828  NA 143  K 5.1  CL 105  CO2 21  GLUCOSE 105*  BUN 48*   CREATININE 1.98*  CALCIUM 9.6   Liver Function Tests: Recent Labs    11/11/20 0828  AST 26  ALT 17  ALKPHOS 82  BILITOT 0.2  PROT 7.6  ALBUMIN 4.5   No results for input(s): LIPASE, AMYLASE in the last 8760 hours. No results for input(s): AMMONIA in the last 8760 hours. CBC: Recent Labs    11/11/20 0828  WBC 5.6  HGB 11.5*  HCT 35.9*  MCV 90  PLT 241   Lipid Panel: Recent Labs    11/11/20  0828  CHOL 113  HDL 46  LDLCALC 54  TRIG 59   TSH: No results for input(s): TSH in the last 8760 hours. A1C: Lab Results  Component Value Date   HGBA1C 6.2 (H) 12/05/2018     Assessment/Plan  1. Seasonal allergic rhinitis, unspecified trigger Symptoms are consistent with seasonal allergic rhinitis.  Lungs are clear.  He has had no fever and does not really feel that bad.  I suggested OTC antihistamine such as Allegra for symptomatic relief   Alain Honey, MD [place] [phone]

## 2021-06-12 NOTE — Progress Notes (Signed)
Called and spoke with patient regarding his most recent echocardiogram results.

## 2021-06-23 ENCOUNTER — Other Ambulatory Visit: Payer: Self-pay | Admitting: Family Medicine

## 2021-07-01 ENCOUNTER — Ambulatory Visit: Payer: Medicare HMO | Admitting: Family Medicine

## 2021-07-03 ENCOUNTER — Other Ambulatory Visit: Payer: Self-pay | Admitting: Cardiology

## 2021-07-03 ENCOUNTER — Other Ambulatory Visit: Payer: Self-pay | Admitting: Family Medicine

## 2021-07-03 DIAGNOSIS — E7800 Pure hypercholesterolemia, unspecified: Secondary | ICD-10-CM

## 2021-07-04 ENCOUNTER — Other Ambulatory Visit: Payer: Self-pay

## 2021-07-04 ENCOUNTER — Encounter: Payer: Self-pay | Admitting: Family Medicine

## 2021-07-04 ENCOUNTER — Ambulatory Visit (INDEPENDENT_AMBULATORY_CARE_PROVIDER_SITE_OTHER): Payer: Medicare HMO | Admitting: Family Medicine

## 2021-07-04 VITALS — BP 148/90 | HR 74 | Temp 98.4°F | Ht 71.0 in | Wt 161.1 lb

## 2021-07-04 DIAGNOSIS — I69351 Hemiplegia and hemiparesis following cerebral infarction affecting right dominant side: Secondary | ICD-10-CM

## 2021-07-04 DIAGNOSIS — R739 Hyperglycemia, unspecified: Secondary | ICD-10-CM | POA: Diagnosis not present

## 2021-07-04 DIAGNOSIS — E7800 Pure hypercholesterolemia, unspecified: Secondary | ICD-10-CM

## 2021-07-04 DIAGNOSIS — G3184 Mild cognitive impairment, so stated: Secondary | ICD-10-CM | POA: Diagnosis not present

## 2021-07-04 DIAGNOSIS — I10 Essential (primary) hypertension: Secondary | ICD-10-CM | POA: Diagnosis not present

## 2021-07-04 NOTE — Progress Notes (Signed)
Provider:  Alain Honey, MD  Careteam: Patient Care Team: Wardell Honour, MD as PCP - General (Family Medicine) Adrian Prows, MD as Consulting Physician (Cardiology) Clent Jacks, MD as Consulting Physician (Ophthalmology) Mal Misty, MD (Inactive) as Consulting Physician (Vascular Surgery) Garvin Fila, MD as Consulting Physician (Neurology) Hayden Pedro, MD as Consulting Physician (Ophthalmology)  PLACE OF SERVICE:  [place]  Advanced Directive information    Allergies  Allergen Reactions   Penicillins Other (See Comments)    Unknown  Has patient had a PCN reaction causing immediate rash, facial/tongue/throat swelling, SOB or lightheadedness with hypotension: {unknown Has patient had a PCN reaction causing severe rash involving mucus membranes or skin necrosis: unknown Has patient had a PCN reaction that required hospitalization {unknown Has patient had a PCN reaction occurring within the last 10 years: no If all of the above answers are "NO", then may proceed with Cephalosporin use.    Chief Complaint  Patient presents with   Medical Management of Chronic Issues    Patient presents today for a 1 month follow-up.   Quality Metric Gaps    Zoster and COVID booster     HPI: Patient is a 80 y.o. male .  Patient is here to follow-up blood pressure and dermatitis.  Regarding the dermatitis it has stopped itching but skin is still dry particularly around the umbilicus and on the hip.  He has seen cardiology 2 months ago and had echo and even though his murmurs are louder echo showed no changes. Daughter and patient tells me memory is stable.  He plays chess online regularly for minimal exercise. At his last visit here he was having symptoms of allergic rhinitis.  Using OTC antihistamine with relief of symptoms Review of Systems:  Review of Systems  Constitutional: Negative.   HENT: Negative.    Eyes: Negative.   Respiratory: Negative.     Cardiovascular: Negative.   Gastrointestinal: Negative.   Genitourinary: Negative.   Skin:  Positive for rash.  All other systems reviewed and are negative.  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 ([place]) 12/11/2018   Hyperlipidemia    Hypertension    Hypertensive kidney disease, benign    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)    Microscopic hematuria    Nondependent cannabis abuse    Pure hypercholesterolemia    Unspecified late effects of cerebrovascular disease    Unspecified late effects of cerebrovascular disease    Unspecified vitamin D deficiency    Unspecified vitamin D deficiency    Past Surgical History:  Procedure Laterality Date   CATARACT EXTRACTION     CORONARY STENT PLACEMENT     2008   EYE SURGERY     left eye,    cornea repair   PERIPHERAL VASCULAR CATHETERIZATION N/A 07/28/2016   Procedure: Lower Extremity Angiography;  Surgeon: Adrian Prows, MD;  Location: [facility];  Service: Cardiovascular;  Laterality: N/A;   Social History:   reports that he quit  smoking about 50 years ago. His smoking use included cigarettes. He has a 6.50 pack-year smoking history. He has never used smokeless tobacco. He reports that he does not drink alcohol and does not use drugs.  Family History  Problem Relation Age of Onset   Cancer Father     Medications: Patient's Medications  New Prescriptions   No medications on file  Previous Medications   AMLODIPINE (NORVASC) 5 MG TABLET    Take 1 tablet (5 mg total)  by mouth daily.   ASPIRIN 81 MG TABLET    Take 81 mg by mouth daily.   CALCIUM CARBONATE-VIT D-MIN (CALTRATE 600+D PLUS PO)    Take by mouth. 1 tablet 2 times a day   CAMPHOR-MENTHOL (SARNA) LOTION    Apply 1 application topically as needed for itching.   CILOSTAZOL (PLETAL) 100 MG TABLET    TAKE 1 TABLET TWICE DAILY   EZETIMIBE (ZETIA) 10 MG TABLET    TAKE 1 TABLET EVERY DAY   FUROSEMIDE (LASIX) 20 MG TABLET    TAKE ONE TABLET BY MOUTH EVERY MORNING AND TAKE ONE TABLET BY MOUTH EVERY EVENING   LATANOPROST (XALATAN) 0.005 % OPHTHALMIC SOLUTION    Place 1 drop into both eyes at bedtime.    LOSARTAN (COZAAR) 100 MG TABLET    TAKE 1 TABLET EVERY DAY   MULTIPLE VITAMINS-MINERALS (HEALTHY EYES/LUTEIN) TABS    Take 1 tablet by mouth daily.   ROSUVASTATIN (CRESTOR) 40 MG TABLET    TAKE 1 TABLET EVERY DAY (DISCONTINUE PRAVASTATIN)   TIMOLOL (BETIMOL) 0.5 % OPHTHALMIC SOLUTION    Place 1 drop into both eyes every morning.    TRIAMCINOLONE 0.1% OINT-CERAVE EQUIVALENT LOTION 1:1 MIXTURE    Apply topically 2 (two) times daily.  Modified Medications   No medications on file  Discontinued Medications   No medications on file    Physical Exam:  Vitals:   07/04/21 1104  BP: (!) 148/90  Pulse: 74  Temp: 98.4 F (36.9 C)  SpO2: 99%  Weight: 161 lb 1.6 oz (73.1 kg)  Height: 5\' 11"  (1.803 m)   Body mass index is 22.47 kg/m. Wt Readings from Last 3 Encounters:  07/04/21 161 lb 1.6 oz (73.1 kg)  06/03/21 156 lb 9.6 oz (71 kg)  05/06/21 158 lb 9.6 oz (71.9 kg)    Physical Exam Vitals and nursing note reviewed.  Constitutional:      Appearance: Normal appearance.  HENT:     Nose: Nose normal.  Cardiovascular:     Rate and Rhythm: Normal rate.     Heart sounds: Murmur heard.  Pulmonary:     Effort: Pulmonary effort is normal.     Breath sounds: Normal breath sounds.  Skin:    Comments: Dry skin around umbilicus and left hip  Neurological:     General: No focal deficit present.      Mental Status: He is alert and oriented to person, place, and time.    Labs reviewed: Basic Metabolic Panel: Recent Labs    11/11/20 0828  NA 143  K 5.1  CL 105  CO2 21  GLUCOSE 105*  BUN 48*  CREATININE 1.98*  CALCIUM 9.6   Liver Function Tests: Recent Labs    11/11/20 0828  AST 26  ALT 17  ALKPHOS 82  BILITOT 0.2  PROT 7.6  ALBUMIN 4.5   No results for input(s): LIPASE, AMYLASE in the last 8760 hours. No results for input(s): AMMONIA in the last 8760 hours. CBC:  Recent Labs    11/11/20 0828  WBC 5.6  HGB 11.5*  HCT 35.9*  MCV 90  PLT 241   Lipid Panel: Recent Labs    11/11/20 0828  CHOL 113  HDL 46  LDLCALC 54  TRIG 59   TSH: No results for input(s): TSH in the last 8760 hours. A1C: Lab Results  Component Value Date   HGBA1C 6.2 (H) 12/05/2018     Assessment/Plan 1. Essential hypertension Blood pressure up a little today.  Continues to take losartan and amlodipine.  Monitor pressures at home  2. 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 Pure hypercholesterolemia LDL at goal. Due to rrecheck 4-23  4. Hyperglycemia A1C suggestspre-diabetes. Reminded about carbs and desserts  5. Mild cognitive impairment with memory loss No changes. Continue brain stimulating exercises such as chess    Alain Honey, MD [place] [phone]

## 2021-07-04 NOTE — Patient Instructions (Signed)
May mix steroid cream with vaseline or A&D

## 2021-07-14 ENCOUNTER — Other Ambulatory Visit: Payer: Self-pay | Admitting: Family Medicine

## 2021-10-03 ENCOUNTER — Encounter (INDEPENDENT_AMBULATORY_CARE_PROVIDER_SITE_OTHER): Payer: Medicare HMO | Admitting: Ophthalmology

## 2021-10-03 ENCOUNTER — Other Ambulatory Visit: Payer: Self-pay

## 2021-10-03 DIAGNOSIS — H35342 Macular cyst, hole, or pseudohole, left eye: Secondary | ICD-10-CM

## 2021-10-03 DIAGNOSIS — H35033 Hypertensive retinopathy, bilateral: Secondary | ICD-10-CM

## 2021-10-03 DIAGNOSIS — H43811 Vitreous degeneration, right eye: Secondary | ICD-10-CM | POA: Diagnosis not present

## 2021-10-03 DIAGNOSIS — I10 Essential (primary) hypertension: Secondary | ICD-10-CM | POA: Diagnosis not present

## 2021-10-07 DIAGNOSIS — H401123 Primary open-angle glaucoma, left eye, severe stage: Secondary | ICD-10-CM | POA: Diagnosis not present

## 2021-10-07 DIAGNOSIS — H401112 Primary open-angle glaucoma, right eye, moderate stage: Secondary | ICD-10-CM | POA: Diagnosis not present

## 2021-10-13 ENCOUNTER — Other Ambulatory Visit: Payer: Self-pay | Admitting: Cardiology

## 2021-12-03 ENCOUNTER — Other Ambulatory Visit: Payer: Self-pay | Admitting: Cardiology

## 2021-12-03 DIAGNOSIS — E7800 Pure hypercholesterolemia, unspecified: Secondary | ICD-10-CM

## 2021-12-05 DIAGNOSIS — R972 Elevated prostate specific antigen [PSA]: Secondary | ICD-10-CM | POA: Diagnosis not present

## 2021-12-12 DIAGNOSIS — N400 Enlarged prostate without lower urinary tract symptoms: Secondary | ICD-10-CM | POA: Diagnosis not present

## 2021-12-12 DIAGNOSIS — R972 Elevated prostate specific antigen [PSA]: Secondary | ICD-10-CM | POA: Diagnosis not present

## 2021-12-15 ENCOUNTER — Other Ambulatory Visit: Payer: Self-pay | Admitting: Family Medicine

## 2021-12-23 DIAGNOSIS — R5383 Other fatigue: Secondary | ICD-10-CM | POA: Diagnosis not present

## 2021-12-23 DIAGNOSIS — I959 Hypotension, unspecified: Secondary | ICD-10-CM | POA: Diagnosis not present

## 2021-12-23 DIAGNOSIS — R55 Syncope and collapse: Secondary | ICD-10-CM | POA: Diagnosis not present

## 2021-12-23 DIAGNOSIS — R4020 Unspecified coma: Secondary | ICD-10-CM | POA: Diagnosis not present

## 2022-01-13 ENCOUNTER — Ambulatory Visit (INDEPENDENT_AMBULATORY_CARE_PROVIDER_SITE_OTHER): Payer: Medicare HMO | Admitting: Family Medicine

## 2022-01-13 ENCOUNTER — Encounter: Payer: Self-pay | Admitting: Family Medicine

## 2022-01-13 VITALS — BP 160/100 | HR 63 | Temp 97.3°F | Ht 71.0 in | Wt 157.6 lb

## 2022-01-13 DIAGNOSIS — I10 Essential (primary) hypertension: Secondary | ICD-10-CM | POA: Diagnosis not present

## 2022-01-13 DIAGNOSIS — I69351 Hemiplegia and hemiparesis following cerebral infarction affecting right dominant side: Secondary | ICD-10-CM | POA: Diagnosis not present

## 2022-01-13 DIAGNOSIS — I350 Nonrheumatic aortic (valve) stenosis: Secondary | ICD-10-CM | POA: Diagnosis not present

## 2022-01-13 DIAGNOSIS — I5032 Chronic diastolic (congestive) heart failure: Secondary | ICD-10-CM | POA: Diagnosis not present

## 2022-01-13 DIAGNOSIS — R7303 Prediabetes: Secondary | ICD-10-CM

## 2022-01-13 DIAGNOSIS — E7800 Pure hypercholesterolemia, unspecified: Secondary | ICD-10-CM | POA: Diagnosis not present

## 2022-01-13 DIAGNOSIS — N1832 Chronic kidney disease, stage 3b: Secondary | ICD-10-CM | POA: Diagnosis not present

## 2022-01-13 NOTE — Progress Notes (Signed)
Provider:  Alain Honey, MD  Careteam: Patient Care Team: Wardell Honour, MD as PCP - General (Family Medicine) Adrian Prows, MD as Consulting Physician (Cardiology) Clent Jacks, MD as Consulting Physician (Ophthalmology) Mal Misty, MD (Inactive) as Consulting Physician (Vascular Surgery) Garvin Fila, MD as Consulting Physician (Neurology) Hayden Pedro, MD as Consulting Physician (Ophthalmology)  PLACE OF SERVICE:  [facility]  Advanced Directive information    Allergies  Allergen Reactions   Penicillins Other (See Comments)    Unknown  Has patient had a PCN reaction causing immediate rash, facial/tongue/throat swelling, SOB or lightheadedness with hypotension: {unknown Has patient had a PCN reaction causing severe rash involving mucus membranes or skin necrosis: unknown Has patient had a PCN reaction that required hospitalization {unknown Has patient had a PCN reaction occurring within the last 10 years: no If all of the above answers are "NO", then may proceed with Cephalosporin use.    No chief complaint on file.    HPI: Patient is a 81 y.o. 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  Generally he has been doing well.  He has been monitoring his blood pressure and generally it has been well controlled.  He denies shortness of breath.  He does take Lasix 40 mg a day.  There has been history of mild cognitive impairment but denies any recent changes with memory.  He is followed by cardiology and has upcoming appointment per history.  There is also a history of chronic kidney disease stage IIIb as well as hyperlipidemia.  We will reassess status of those problems with labs today.  Review of Systems:  Review of Systems  Constitutional: Negative.   Eyes: Negative.   Respiratory: Negative.    Cardiovascular: Negative.    Gastrointestinal: Negative.   Musculoskeletal:  Positive for back pain.  Neurological: Negative.   All other systems reviewed and are negative.   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 ([place]) 12/11/2018   Hyperlipidemia    Hypertension    Hypertensive kidney disease, benign    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)    Microscopic hematuria    Nondependent cannabis abuse    Pure hypercholesterolemia    Unspecified late effects of cerebrovascular disease    Unspecified late effects of cerebrovascular disease    Unspecified vitamin D deficiency    Unspecified vitamin D deficiency    Past Surgical History:  Procedure Laterality Date   CATARACT EXTRACTION     CORONARY STENT PLACEMENT     2008   EYE SURGERY     left eye,    cornea repair   PERIPHERAL VASCULAR CATHETERIZATION N/A 07/28/2016   Procedure: Lower Extremity Angiography;  Surgeon: Adrian Prows, MD;  Location: [facility];  Service: Cardiovascular;  Laterality: N/A;   Social History:   reports that he quit smoking about 50 years ago. His smoking use included  cigarettes. He has a 6.50 pack-year smoking history. He has never used smokeless tobacco. He reports that he does not drink alcohol and does not use drugs.  Family History  Problem Relation Age of Onset   Cancer Father     Medications: Patient's Medications  New Prescriptions   No medications on file  Previous Medications   AMLODIPINE (NORVASC) 5 MG TABLET    TAKE 1 TABLET EVERY DAY   ASPIRIN 81  MG TABLET    Take 81 mg by mouth daily.   CALCIUM CARBONATE-VIT D-MIN (CALTRATE 600+D PLUS PO)    Take by mouth. 1 tablet 2 times a day   CAMPHOR-MENTHOL (SARNA) LOTION    Apply 1 application topically as needed for itching.   CILOSTAZOL (PLETAL) 100 MG TABLET    TAKE 1 TABLET TWICE DAILY   EZETIMIBE (ZETIA) 10 MG TABLET    TAKE 1 TABLET EVERY DAY   FUROSEMIDE (LASIX) 20 MG TABLET    TAKE ONE TABLET EVERY MORNING AND TAKE ONE TABLET EVERY EVENING   LATANOPROST (XALATAN) 0.005 % OPHTHALMIC SOLUTION    Place 1 drop into both eyes at bedtime.    LOSARTAN (COZAAR) 100 MG TABLET    TAKE 1 TABLET EVERY DAY   MULTIPLE VITAMINS-MINERALS (HEALTHY EYES/LUTEIN) TABS    Take 1 tablet by mouth daily.   ROSUVASTATIN (CRESTOR) 40 MG TABLET    TAKE 1 TABLET EVERY DAY (DISCONTINUE PRAVASTATIN)   TIMOLOL (BETIMOL) 0.5 % OPHTHALMIC SOLUTION    Place 1 drop into both eyes every morning.    TRIAMCINOLONE 0.1% OINT-CERAVE EQUIVALENT LOTION 1:1 MIXTURE    Apply topically 2 (two) times daily.  Modified Medications   No medications on file  Discontinued Medications   No medications on file    Physical Exam:  Vitals:   01/13/22 1107  Weight: 157 lb 9.6 oz (71.5 kg)  Height: '5\' 11"'$  (1.803 m)   Body mass index is 21.98 kg/m. Wt Readings from Last 3 Encounters:  01/13/22 157 lb 9.6 oz (71.5 kg)  07/04/21 161 lb 1.6 oz (73.1 kg)  06/03/21 156 lb 9.6 oz (71 kg)    Physical Exam Vitals and nursing note reviewed.  Constitutional:      Appearance: Normal appearance.  Cardiovascular:     Rate and Rhythm: Normal rate and regular rhythm.     Heart sounds: Murmur heard.  Pulmonary:     Effort: Pulmonary effort is normal.     Breath sounds: Normal breath sounds.  Abdominal:     Palpations: Abdomen is soft.  Musculoskeletal:     Right lower leg: No edema.     Left lower leg: No edema.  Skin:    Findings: Rash present.     Comments: Rash has improved with triamcinolone cream  Neurological:     General: No  focal deficit present.     Mental Status: He is alert.     Motor: Weakness present.     Comments: Right-sided weakness secondary to old CVA  Psychiatric:        Mood and Affect: Mood normal.        Behavior: Behavior normal.        Thought Content: Thought content normal.     Labs reviewed: Basic Metabolic Panel: No results for input(s): "NA", "K", "CL", "CO2", "GLUCOSE", "BUN", "CREATININE", "CALCIUM", "MG", "PHOS", "TSH" in the last 8760 hours. Liver Function Tests: No results for input(s): "AST", "ALT", "ALKPHOS", "BILITOT", "PROT", "ALBUMIN" in the last 8760 hours. No results  for input(s): "LIPASE", "AMYLASE" in the last 8760 hours. No results for input(s): "AMMONIA" in the last 8760 hours. CBC: No results for input(s): "WBC", "NEUTROABS", "HGB", "HCT", "MCV", "PLT" in the last 8760 hours. Lipid Panel: No results for input(s): "CHOL", "HDL", "LDLCALC", "TRIG", "CHOLHDL", "LDLDIRECT" in the last 8760 hours. TSH: No results for input(s): "TSH" in the last 8760 hours. A1C: Lab Results  Component Value Date   HGBA1C 6.2 (H) 12/05/2018     Assessment/Plan  1. Essential hypertension Blood pressure initially 160/100 but repeat by me was 130/78.  He takes losartan and amlodipine 1 at night and 1 in the morning  2. Pure hypercholesterolemia Continues with as a 10 may have been for cholesterol and LDL was 54 when last assessed 1 year ago  3. Prediabetes A1c 6.23 years ago  4. Stage 3b chronic kidney disease (HCC) Creatinine 1.98 with GFR 34 1 year ago.  If there is decline consider addition of Farxiga  5. Nonrheumatic aortic valve stenosis Denies any chest pain or symptoms consistent with failure  6. Chronic diastolic heart failure (HCC) Asymptomatic at this time continues on Lasix 40 mg  7. 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  He does exercise somewhat weight and a exercise bike but is really unable to walk  significant distances due to his right-sided weakness   Alain Honey, MD [place] [phone]

## 2022-01-14 ENCOUNTER — Other Ambulatory Visit: Payer: Self-pay

## 2022-01-14 ENCOUNTER — Telehealth: Payer: Self-pay | Admitting: Family Medicine

## 2022-01-14 DIAGNOSIS — N1832 Chronic kidney disease, stage 3b: Secondary | ICD-10-CM

## 2022-01-14 DIAGNOSIS — I10 Essential (primary) hypertension: Secondary | ICD-10-CM

## 2022-01-14 LAB — COMPLETE METABOLIC PANEL WITH GFR
AG Ratio: 1.2 (calc) (ref 1.0–2.5)
ALT: 17 U/L (ref 9–46)
AST: 22 U/L (ref 10–35)
Albumin: 4.2 g/dL (ref 3.6–5.1)
Alkaline phosphatase (APISO): 76 U/L (ref 35–144)
BUN/Creatinine Ratio: 21 (calc) (ref 6–22)
BUN: 53 mg/dL — ABNORMAL HIGH (ref 7–25)
CO2: 24 mmol/L (ref 20–32)
Calcium: 9.6 mg/dL (ref 8.6–10.3)
Chloride: 104 mmol/L (ref 98–110)
Creat: 2.58 mg/dL — ABNORMAL HIGH (ref 0.70–1.22)
Globulin: 3.4 g/dL (calc) (ref 1.9–3.7)
Glucose, Bld: 82 mg/dL (ref 65–139)
Potassium: 5.2 mmol/L (ref 3.5–5.3)
Sodium: 137 mmol/L (ref 135–146)
Total Bilirubin: 0.4 mg/dL (ref 0.2–1.2)
Total Protein: 7.6 g/dL (ref 6.1–8.1)
eGFR: 24 mL/min/{1.73_m2} — ABNORMAL LOW (ref >=60)

## 2022-01-14 LAB — LIPID PANEL
Cholesterol: 105 mg/dL (ref <200)
HDL: 45 mg/dL (ref >=40)
LDL Cholesterol (Calc): 45 mg/dL (calc)
Non-HDL Cholesterol (Calc): 60 mg/dL (calc) (ref <130)
Total CHOL/HDL Ratio: 2.3 (calc) (ref <5.0)
Triglycerides: 70 mg/dL (ref <150)

## 2022-01-14 LAB — HEMOGLOBIN A1C
Hgb A1c MFr Bld: 6.2 % of total Hgb — ABNORMAL HIGH (ref <5.7)
Mean Plasma Glucose: 131 mg/dL
eAG (mmol/L): 7.3 mmol/L

## 2022-01-14 MED ORDER — DAPAGLIFLOZIN PROPANEDIOL 10 MG PO TABS: 10.0000 mg | ORAL_TABLET | Freq: Every day | ORAL | 0 refills | Status: DC

## 2022-01-14 NOTE — Telephone Encounter (Signed)
Medication Pended and sent to Dr. Sabra Heck for approval due to [place] Warning.

## 2022-01-14 NOTE — Telephone Encounter (Signed)
FARXIGA 10 MG was sent to [facility]. Pt cannot afford $95 per daughter [name]. She picked up the RX but wants it sent to [facility] to see if it is cheaper.

## 2022-01-14 NOTE — Addendum Note (Signed)
Addended by: Rafael Bihari A on: 01/14/2022 03:51 PM   Modules accepted: Orders

## 2022-01-19 NOTE — Telephone Encounter (Signed)
Patient is wanting to see what the cost would be through [employer].

## 2022-01-27 MED ORDER — DAPAGLIFLOZIN PROPANEDIOL 10 MG PO TABS: 10.0000 mg | ORAL_TABLET | Freq: Every day | ORAL | 1 refills | Status: DC

## 2022-02-02 ENCOUNTER — Telehealth: Payer: Self-pay

## 2022-02-02 MED ORDER — DAPAGLIFLOZIN PROPANEDIOL 10 MG PO TABS: 10.0000 mg | ORAL_TABLET | Freq: Every day | ORAL | 0 refills | Status: DC

## 2022-02-02 NOTE — Telephone Encounter (Signed)
Patient's daughter Jonelle Sidle) called requesting supplies of Farxiga 10 mg  for a patient due to 30 day supply costing $95 for patient. Samples was left upfront for patient's daughter to come pick up.

## 2022-02-09 ENCOUNTER — Other Ambulatory Visit: Payer: Self-pay | Admitting: Family Medicine

## 2022-02-24 ENCOUNTER — Other Ambulatory Visit: Payer: Medicare HMO

## 2022-02-24 DIAGNOSIS — I10 Essential (primary) hypertension: Secondary | ICD-10-CM | POA: Diagnosis not present

## 2022-02-24 DIAGNOSIS — N1832 Chronic kidney disease, stage 3b: Secondary | ICD-10-CM

## 2022-02-24 LAB — COMPLETE METABOLIC PANEL WITH GFR
AG Ratio: 1.2 (calc) (ref 1.0–2.5)
ALT: 15 U/L (ref 9–46)
AST: 18 U/L (ref 10–35)
Albumin: 4.3 g/dL (ref 3.6–5.1)
Alkaline phosphatase (APISO): 74 U/L (ref 35–144)
BUN/Creatinine Ratio: 19 (calc) (ref 6–22)
BUN: 47 mg/dL — ABNORMAL HIGH (ref 7–25)
CO2: 27 mmol/L (ref 20–32)
Calcium: 10.1 mg/dL (ref 8.6–10.3)
Chloride: 103 mmol/L (ref 98–110)
Creat: 2.51 mg/dL — ABNORMAL HIGH (ref 0.70–1.22)
Globulin: 3.5 g/dL (calc) (ref 1.9–3.7)
Glucose, Bld: 117 mg/dL (ref 65–139)
Potassium: 4.9 mmol/L (ref 3.5–5.3)
Sodium: 138 mmol/L (ref 135–146)
Total Bilirubin: 0.4 mg/dL (ref 0.2–1.2)
Total Protein: 7.8 g/dL (ref 6.1–8.1)
eGFR: 25 mL/min/{1.73_m2} — ABNORMAL LOW (ref >=60)

## 2022-02-27 ENCOUNTER — Other Ambulatory Visit: Payer: Self-pay | Admitting: Family Medicine

## 2022-02-27 DIAGNOSIS — N1832 Chronic kidney disease, stage 3b: Secondary | ICD-10-CM

## 2022-02-27 NOTE — Progress Notes (Signed)
Ambulatory referral to nephrology

## 2022-03-02 ENCOUNTER — Other Ambulatory Visit: Payer: Self-pay | Admitting: Family Medicine

## 2022-03-18 ENCOUNTER — Telehealth: Payer: Self-pay

## 2022-03-18 NOTE — Telephone Encounter (Signed)
Patient's daughter called stating she needs prescription for Farxiga sent to [facility]

## 2022-04-03 ENCOUNTER — Other Ambulatory Visit: Payer: Self-pay | Admitting: Nephrology

## 2022-04-03 DIAGNOSIS — N179 Acute kidney failure, unspecified: Secondary | ICD-10-CM

## 2022-04-03 DIAGNOSIS — N1832 Chronic kidney disease, stage 3b: Secondary | ICD-10-CM | POA: Diagnosis not present

## 2022-04-03 DIAGNOSIS — R7303 Prediabetes: Secondary | ICD-10-CM | POA: Diagnosis not present

## 2022-04-03 DIAGNOSIS — N184 Chronic kidney disease, stage 4 (severe): Secondary | ICD-10-CM | POA: Diagnosis not present

## 2022-04-03 DIAGNOSIS — I5032 Chronic diastolic (congestive) heart failure: Secondary | ICD-10-CM

## 2022-04-03 DIAGNOSIS — I129 Hypertensive chronic kidney disease with stage 1 through stage 4 chronic kidney disease, or unspecified chronic kidney disease: Secondary | ICD-10-CM

## 2022-04-03 DIAGNOSIS — I130 Hypertensive heart and chronic kidney disease with heart failure and stage 1 through stage 4 chronic kidney disease, or unspecified chronic kidney disease: Secondary | ICD-10-CM | POA: Diagnosis not present

## 2022-04-04 LAB — COMPREHENSIVE METABOLIC PANEL
Albumin: 4.3 (ref 3.5–5.0)
Calcium: 9.5 (ref 8.7–10.7)

## 2022-04-04 LAB — BASIC METABOLIC PANEL
BUN: 47 — AB (ref 4–21)
CO2: 21 (ref 13–22)
Chloride: 105 (ref 99–108)
Creatinine: 1.9 — AB (ref 0.6–1.3)
Glucose: 120
Potassium: 4.8 mEq/L (ref 3.5–5.1)
Sodium: 140 (ref 137–147)

## 2022-04-07 DIAGNOSIS — H2511 Age-related nuclear cataract, right eye: Secondary | ICD-10-CM | POA: Diagnosis not present

## 2022-04-07 DIAGNOSIS — H401112 Primary open-angle glaucoma, right eye, moderate stage: Secondary | ICD-10-CM | POA: Diagnosis not present

## 2022-04-07 DIAGNOSIS — Z9889 Other specified postprocedural states: Secondary | ICD-10-CM | POA: Diagnosis not present

## 2022-04-07 DIAGNOSIS — Z961 Presence of intraocular lens: Secondary | ICD-10-CM | POA: Diagnosis not present

## 2022-04-10 ENCOUNTER — Ambulatory Visit
Admission: RE | Admit: 2022-04-10 | Discharge: 2022-04-10 | Disposition: A | Discharge status: Home / Self Care | Payer: Medicare HMO | Source: Ambulatory Visit | Attending: Nephrology | Admitting: Nephrology

## 2022-04-10 DIAGNOSIS — I129 Hypertensive chronic kidney disease with stage 1 through stage 4 chronic kidney disease, or unspecified chronic kidney disease: Secondary | ICD-10-CM

## 2022-04-10 DIAGNOSIS — I5032 Chronic diastolic (congestive) heart failure: Secondary | ICD-10-CM

## 2022-04-10 DIAGNOSIS — R7303 Prediabetes: Secondary | ICD-10-CM

## 2022-04-10 DIAGNOSIS — N189 Chronic kidney disease, unspecified: Secondary | ICD-10-CM | POA: Diagnosis not present

## 2022-04-10 DIAGNOSIS — N1832 Chronic kidney disease, stage 3b: Secondary | ICD-10-CM

## 2022-04-10 DIAGNOSIS — N179 Acute kidney failure, unspecified: Secondary | ICD-10-CM | POA: Diagnosis not present

## 2022-04-10 DIAGNOSIS — N400 Enlarged prostate without lower urinary tract symptoms: Secondary | ICD-10-CM | POA: Diagnosis not present

## 2022-04-20 ENCOUNTER — Encounter: Payer: Self-pay | Admitting: Cardiology

## 2022-04-20 ENCOUNTER — Ambulatory Visit: Payer: Medicare HMO | Admitting: Cardiology

## 2022-04-20 VITALS — BP 140/55 | HR 60 | Temp 97.7°F | Resp 16 | Ht 71.0 in | Wt 156.2 lb

## 2022-04-20 DIAGNOSIS — I10 Essential (primary) hypertension: Secondary | ICD-10-CM

## 2022-04-20 DIAGNOSIS — I350 Nonrheumatic aortic (valve) stenosis: Secondary | ICD-10-CM

## 2022-04-20 DIAGNOSIS — I5032 Chronic diastolic (congestive) heart failure: Secondary | ICD-10-CM | POA: Diagnosis not present

## 2022-04-20 DIAGNOSIS — I351 Nonrheumatic aortic (valve) insufficiency: Secondary | ICD-10-CM | POA: Diagnosis not present

## 2022-04-20 DIAGNOSIS — E7800 Pure hypercholesterolemia, unspecified: Secondary | ICD-10-CM

## 2022-04-20 DIAGNOSIS — I779 Disorder of arteries and arterioles, unspecified: Secondary | ICD-10-CM | POA: Diagnosis not present

## 2022-04-20 NOTE — Progress Notes (Unsigned)
Primary Physician/Referring:  Wardell Honour, MD  Patient ID: [name], male    DOB: 1941/06/03, 81 y.o.   MRN: [id number]  No chief complaint on file.   HPI: [name]  is a 81 y.o. 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  The patient presents today for 6 month follow up for hypertension, peripheral arterial disease and hyperlipidemia. Overall he is feeling well and has no specific complaints except for chronic symptoms of claudication in bilateral calves.  No other symptoms, denies chest pain or dyspnea, no PND or orthopnea.  He is accompanied by his God daughter [name].  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 ([place]) 12/11/2018   Hyperlipidemia    Hypertension    Hypertensive kidney disease, benign    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)    Microscopic hematuria    Nondependent cannabis abuse    Pure hypercholesterolemia    Unspecified late effects of cerebrovascular disease    Unspecified late effects of cerebrovascular disease    Unspecified vitamin D deficiency    Unspecified vitamin D deficiency     Past Surgical History:  Procedure Laterality Date   CATARACT EXTRACTION     CORONARY STENT PLACEMENT     2008   EYE SURGERY      left eye,    cornea repair   PERIPHERAL VASCULAR CATHETERIZATION N/A 07/28/2016   Procedure: Lower Extremity Angiography;  Surgeon: Adrian Prows, MD;  Location: [facility];  Service: Cardiovascular;  Laterality: N/A;   Social History   Tobacco Use   Smoking status: Former    Packs/day: 0.50    Years: 13.00    Total pack years: 6.50    Types: Cigarettes    Quit date: 03/09/1971    Years since quitting: 51.1   Smokeless tobacco: Never  Substance Use Topics   Alcohol use: No    Alcohol/week: 0.0 standard drinks of alcohol    Marital Status: Widowed   Review of Systems  Cardiovascular:  Positive for claudication. Negative for chest pain, dyspnea on exertion and leg swelling.  Neurological:  Positive for focal weakness (right hemiparesis).   Objective  Blood pressure (!) 153/55, pulse 60, temperature 97.7 F (36.5 C), temperature source Temporal, resp. rate 16, height '5\' 11"'$  (1.803 m), weight 156 lb 3.2 oz (70.9 kg), SpO2 97 %. Body mass index is 21.79 kg/m.     04/20/2022   11:19 AM 01/13/2022   11:07 AM 07/04/2021   11:04 AM  Vitals with BMI  Height '5\' 11"'$  '5\' 11"'$  '5\' 11"'$   Weight 156 lbs 3 oz 157 lbs 10 oz 161 lbs 2 oz  BMI 21.8 49.70 26.37  Systolic 858 850 277  Diastolic 55 412 90  Pulse 60 63 74  Physical Exam Constitutional:      Appearance: He is well-developed.  Neck:     Thyroid: No thyromegaly.     Vascular: No JVD.  Cardiovascular:     Rate and Rhythm: Normal rate and regular rhythm.     Pulses:          Carotid pulses are  on the right side with bruit and  on the left side with bruit.      Femoral pulses are 2+ on the right side and 2+ on the left side.      Popliteal pulses are 1+ on the right side and 2+ on the left side.       Dorsalis pedis pulses are 0 on the right side and 0 on the left side.       Posterior tibial pulses are 0 on the right side and 0 on the left side.     Heart sounds: S1 normal and S2 normal. Murmur heard.     Early  systolic murmur is present with a grade of 2/6 at the upper right sternal border radiating to the neck.     High-pitched blowing decrescendo early diastolic murmur is present with a grade of 3/4 at the upper right sternal border radiating to the apex.     No gallop.  Pulmonary:     Effort: Pulmonary effort is normal.     Breath sounds: Normal breath sounds.  Abdominal:     General: Bowel sounds are normal.     Palpations: Abdomen is soft.  Musculoskeletal:     Right lower leg: No edema (Bilateral legs loss of hair.).     Left lower leg: No edema (Bilateral legs loss of hair.).    Radiology: CT scan of the head without contrast: 11/29/2019: No acute intracranial abnormality. Findings consistent with age related atrophy and chronic small vessel ischemia The area of encephalomalacia involving the right frontoparietal lobe  CXR 11/28/2009: There is no evidence of acute infiltrate, pleural effusion or pneumothorax. The cardiac silhouette is mildly enlarged. There is mild calcification of the thoracic aorta. The visualized skeletal structures are unremarkable.  Laboratory examination:      Latest Ref Rng & Units 04/04/2022   12:00 AM 02/24/2022   10:52 AM 01/13/2022   11:55 AM  CMP  Glucose 65 - 139 mg/dL  117  82   BUN 4 - 21 47  47  53   Creatinine 0.6 - 1.3 1.9  2.51  2.58   Sodium 137 - 147 140  138  137   Potassium 3.5 - 5.1 mEq/L 4.8  4.9  5.2   Chloride 99 - 108 105  103  104   CO2 13 - '22 21  27  24   '$ Calcium 8.7 - 10.7 9.5  10.1  9.6   Total Protein 6.1 - 8.1 g/dL  7.8  7.6   Total Bilirubin 0.2 - 1.2 mg/dL  0.4  0.4   AST 10 - 35 U/L  18  22   ALT 9 - 46 U/L  15  17       Latest Ref Rng & Units 11/11/2020    8:28 AM 11/28/2019    3:46 PM 08/24/2019   12:15 PM  CBC  WBC 3.4 - 10.8 x10E3/uL 5.6  6.6  5.7   Hemoglobin 13.0 - 17.7 g/dL 11.5  11.7  11.8   Hematocrit 37.5 - 51.0 % 35.9  38.4  36.2   Platelets 150 - 450 x10E3/uL  241  266  266    Lipid Panel  Recent  Labs    01/13/22 1155  CHOL 105  TRIG 70  LDLCALC 45  HDL 45  CHOLHDL 2.3     HEMOGLOBIN A1C Lab Results  Component Value Date   HGBA1C 6.2 (H) 01/13/2022   MPG 131 01/13/2022   TSH No results for input(s): "TSH" in the last 8760 hours.   Allergies:   Allergies  Allergen Reactions   Penicillins Other (See Comments)    Unknown  Has patient had a PCN reaction causing immediate rash, facial/tongue/throat swelling, SOB or lightheadedness with hypotension: {unknown Has patient had a PCN reaction causing severe rash involving mucus membranes or skin necrosis: unknown Has patient had a PCN reaction that required hospitalization {unknown Has patient had a PCN reaction occurring within the last 10 years: no If all of the above answers are "NO", then may proceed with Cephalosporin use.    Medications:    Current Outpatient Medications:    amLODipine (NORVASC) 5 MG tablet, TAKE 1 TABLET EVERY DAY, Disp: 90 tablet, Rfl: 3   aspirin 81 MG tablet, Take 81 mg by mouth daily., Disp: , Rfl:    Calcium Carbonate-Vit D-Min (CALTRATE 600+D PLUS PO), Take by mouth. 1 tablet 2 times a day, Disp: , Rfl:    camphor-menthol (SARNA) lotion, Apply 1 application topically as needed for itching., Disp: 222 mL, Rfl: 0   cilostazol (PLETAL) 100 MG tablet, TAKE 1 TABLET TWICE DAILY, Disp: 180 tablet, Rfl: 2   dapagliflozin propanediol (FARXIGA) 10 MG TABS tablet, Take 1 tablet (10 mg total) by mouth daily before breakfast., Disp: 90 tablet, Rfl: 1   ezetimibe (ZETIA) 10 MG tablet, TAKE 1 TABLET EVERY DAY, Disp: 90 tablet, Rfl: 3   furosemide (LASIX) 20 MG tablet, TAKE 1 TABLET EVERY MORNING AND EVENING, Disp: 180 tablet, Rfl: 1   latanoprost (XALATAN) 0.005 % ophthalmic solution, Place 1 drop into both eyes at bedtime. , Disp: , Rfl:    losartan (COZAAR) 100 MG tablet, TAKE 1 TABLET EVERY DAY, Disp: 90 tablet, Rfl: 1   Multiple Vitamins-Minerals (HEALTHY EYES/LUTEIN) TABS, Take 1 tablet by mouth  daily., Disp: , Rfl:    rosuvastatin (CRESTOR) 40 MG tablet, TAKE 1 TABLET EVERY DAY (DISCONTINUE PRAVASTATIN), Disp: 90 tablet, Rfl: 0   timolol (BETIMOL) 0.5 % ophthalmic solution, Place 1 drop into both eyes every morning. , Disp: , Rfl:    triamcinolone 0.1% oint-Cerave equivalent lotion 1:1 mixture, Apply topically 2 (two) times daily., Disp: 454 g, Rfl: 1     Cardiac Studies:   Lower Extremity Doppler  [06/16/2016]:  No hemodynamically significant stenoses are identified in the right lower extremity arterial system. There is severe diffuse mixed plaque throughout. Moderate velocity increase at the left mid superficial femoral artery suggesting >50% stenosis. There is severe diffuse mixed plaque througout. This exam reveals severely decreased perfusion of the right lower extremity with RABI 0.46 and moderately decreased perfusion of the left lower extremity with LABI 0.69, noted at the post tibial artery level.  Peripheral arteriogram 07/28/2016: Moderate diffuse SFA disease. Right below knee severe diffuse disease with PT run-off. Left PT and Peroneal run-off, diffuse disease  Carotid Doppler   08/12/2017: Mild stenosis in the right internal carotid artery (1-15%). Minimal stenosis in the right common carotid artery (<50%). Antegrade right vertebral artery flow. Bidirectional left vertebral artery flow. May suggest proximal left subclavian stenosis. Compared to the study done on 08/12/2016, right ICA stenosis of  less than 50% no longer present. Further studies when clinically indicated.  Remote outpatient cardiac telemetry 11/29/2019 for 1 week: 08/31/2019 through 12/04/2019: Predominant rhythm is normal sinus rhythm.  Minimum heart rate 48, maximum heart rate 131 and average heart rate 67 bpm. Ventricular ectopy 309 22 with 11 ventricular pairs and 0 ventricular tachycardia runs. 13,034 PACs.  5 supraventricular runs, longest 13 beats.  No atrial fibrillation.  There were 0 patient  activated events.  Lexiscan Tetrofosmin stress test 12/20/2019: No previous exam available for comparison. Lexiscan nuclear stress test performed using 1-day protocol. Stress EKG is non-diagnostic, as this is pharmacological stress test. Rest and stress EKG shows sinus rhythm, RBBB, T wave inversion inferolateral leads, occasional PAC. SPECT images show small sized, mild intensity, mildly reversible perfusion defect in mid to basal inferior myocardium. Stress LVEF calculated 44%, although visually appears normal. No regional abnormalities of myocardial thickening noted. Low risk study.   PCV ECHOCARDIOGRAM COMPLETE 05/27/2021  Narrative Echocardiogram 05/27/2021: Left ventricle cavity is normal in size. Severe concentric hypertrophy of the left ventricle. Normal global wall motion. Normal LV systolic function with EF 57%. Doppler evidence of grade I (impaired) diastolic dysfunction, normal LAP. Left atrial cavity is mildly dilated. Aortic valve probably trileaflet. Moderate aortic valve leaflet calcification. Moderate aortic stenosis. Vmax 3.3 m/sec, mean PG 23 mmHg, AVA 1.1 cm by continuity equation. Moderate (Grade III) aortic regurgitation. Mild (Grade I) mitral regurgitation. Mild tricuspid regurgitation. No evidence of pulmonary hypertension. Compared to previous study on 01/08/2020, mean PG is likely underestimated at 23 mmHg, compared to 35 mmHg then.  EKG:  EKG 04/20/2022: Normal sinus rhythm/sinus bradycardia rate of 59 bpm, left atrial enlargement, left axis deviation, left anterior fascicular block.  Right bundle branch block.  LVH.  Nonspecific T abnormality.  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  Compared to 04/25/2021, no significant change.  Assessment     ICD-10-CM   1. PAOD (peripheral arterial occlusive disease) (HCC)  I77.9     2. Primary hypertension  I10 EKG 12-Lead    3. Moderate aortic regurgitation  I35.1     4. Stage 3a chronic kidney disease (HCC)  N18.31      5. Hypercholesteremia  E78.00      Medications Discontinued During This Encounter  Medication Reason   dapagliflozin propanediol (FARXIGA) 10 MG TABS tablet    No orders of the defined types were placed in this encounter.  Orders Placed This Encounter  Procedures   EKG 12-Lead    Recommendations:   [name] "Warner Mccreedy"  Gappa is a 81 y.o. 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  The patient presents today for 6 month follow up for hypertension, peripheral arterial disease and hyperlipidemia. Overall he is feeling well and has no specific complaints except for chronic symptoms of claudication in bilateral calves.  His physical examination has not changed with absent pedal pulses, symptoms of PAD being stable, continue medical therapy especially in view of chronic stage IIIb kidney disease.  His risk factors are now well controlled, renal function has remained stable over the past 2 years, blood pressure is well controlled, lipids are at goal. He is aortic regurgitation murmur and aortic stenosis murmur appears to be much more prominent today.  I would like to repeat his echocardiogram.  Otherwise he  is stable from cardiac standpoint, I will see him back in a year or sooner if problems.  I encouraged him to walk on a regular basis.   Adrian Prows, MD, [facility] 04/20/2022, 11:27 AM Office: [phone]

## 2022-04-21 ENCOUNTER — Encounter: Payer: Self-pay | Admitting: Cardiology

## 2022-05-05 ENCOUNTER — Encounter: Payer: Self-pay | Admitting: Family

## 2022-05-05 ENCOUNTER — Telehealth: Payer: Self-pay

## 2022-05-05 ENCOUNTER — Ambulatory Visit (INDEPENDENT_AMBULATORY_CARE_PROVIDER_SITE_OTHER): Payer: Medicare HMO | Admitting: Family

## 2022-05-05 DIAGNOSIS — Z0000 Encounter for general adult medical examination without abnormal findings: Secondary | ICD-10-CM | POA: Diagnosis not present

## 2022-05-05 NOTE — Progress Notes (Signed)
This service is provided via telemedicine  No vital signs collected/recorded due to the encounter was a telemedicine visit.   Location of patient (ex: home, work):  home  Patient consents to a telephone visit:  yes see telephone encounter dated 05/05/22  Location of the provider (ex: office, home):  [facility]   Name of any referring provider:  Wardell Honour, MD   Names of all persons participating in the telemedicine service and their role in the encounter:  Brooke CMA, Nelda Bucks Tuck Dulworth NP  Time spent on call:  10 minutes     Subjective:   [name] is a 81 y.o. male who presents for Medicare Annual/Subsequent preventive examination.  Review of Systems     Cardiac Risk Factors include: advanced age (>56mn, >>50women);hypertension;male gender;smoking/ tobacco exposure     Objective:    There were no vitals filed for this visit. There is no height or weight on file to calculate BMI.     05/05/2022    3:32 PM 05/02/2021    9:31 AM 07/22/2020   10:14 AM 04/30/2020    9:24 AM 03/21/2020   10:22 AM 11/29/2019   12:03 AM 11/28/2019   12:11 PM  Advanced Directives  Does Patient Have a Medical Advance Directive? No No No No No No No  Would patient like information on creating a medical advance directive? No - Patient declined No - Patient declined No - Patient declined No - Patient declined Yes (ED - Information included in AVS) No - Patient declined No - Patient declined    Current Medications (verified) Outpatient Encounter Medications as of 05/05/2022  Medication Sig   amLODipine (NORVASC) 5 MG tablet TAKE 1 TABLET EVERY DAY   aspirin 81 MG tablet Take 81 mg by mouth daily.   Calcium Carbonate-Vit D-Min (CALTRATE 600+D PLUS PO) Take by mouth. 1 tablet 2 times a day   camphor-menthol (SARNA) lotion Apply 1 application topically as needed for itching.   dapagliflozin propanediol (FARXIGA) 10 MG TABS tablet Take 1 tablet (10 mg total) by  mouth daily before breakfast.   ezetimibe (ZETIA) 10 MG tablet TAKE 1 TABLET EVERY DAY   furosemide (LASIX) 40 MG tablet Take 40 mg by mouth every Monday, Wednesday, and Friday.   latanoprost (XALATAN) 0.005 % ophthalmic solution Place 1 drop into both eyes at bedtime.    losartan (COZAAR) 100 MG tablet TAKE 1 TABLET EVERY DAY   Multiple Vitamins-Minerals (HEALTHY EYES/LUTEIN) TABS Take 1 tablet by mouth daily.   rosuvastatin (CRESTOR) 40 MG tablet TAKE 1 TABLET EVERY DAY (DISCONTINUE PRAVASTATIN)   timolol (BETIMOL) 0.5 % ophthalmic solution Place 1 drop into both eyes every morning.    triamcinolone 0.1% oint-Cerave equivalent lotion 1:1 mixture Apply topically 2 (two) times daily.   [DISCONTINUED] furosemide (LASIX) 20 MG tablet TAKE 1 TABLET EVERY MORNING AND EVENING (Patient taking differently: Take 40 mg by mouth as directed. Mon, Wed Fri)   No facility-administered encounter medications on file as of 05/05/2022.    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)    Pure hypercholesterolemia    Unspecified vitamin D deficiency    Past Surgical History:  Procedure Laterality Date   CATARACT EXTRACTION     EYE SURGERY     left eye,    cornea repair   PERIPHERAL VASCULAR CATHETERIZATION N/A 07/28/2016   Procedure: Lower Extremity Angiography;  Surgeon: Adrian Prows, MD;  Location: [place] [facility];  Service: Cardiovascular;  Laterality: N/A;   Family History   Problem Relation Age of Onset   Cancer Father    Social History   Socioeconomic History   Marital status: Divorced    Spouse name: Not on file   Number of children: 2   Years of education: Not on file   Highest education level: Not on file  Occupational History   Not on file  Tobacco Use   Smoking status: Former    Packs/day: 0.50    Years: 13.00    Total pack years: 6.50    Types: Cigarettes    Quit date: 03/09/1971    Years since quitting: 51.1   Smokeless tobacco: Never  Vaping Use   Vaping Use: Never used  Substance and Sexual Activity   Alcohol use: No    Alcohol/week: 0.0 standard drinks of alcohol   Drug use: No   Sexual activity: Not on file  Other Topics Concern   Not on file  Social History Narrative   Not on file   Social Determinants of Health   Financial Resource Strain: Low Risk  (04/18/2018)   Overall Financial Resource Strain (CARDIA)    Difficulty of Paying Living Expenses: Not hard at all  Food Insecurity: No Food Insecurity (04/18/2018)   Hunger Vital Sign    Worried About Running Out of Food in the Last Year: Never true    [place] in the Last Year: Never true  Transportation Needs: No Transportation Needs (04/18/2018)   PRAPARE - Hydrologist (Medical): No    Lack of Transportation (Non-Medical): No  Physical Activity: Insufficiently Active (04/18/2018)   Exercise Vital Sign    Days of Exercise per Week: 4 days    Minutes of Exercise per Session: 10 min  Stress: No Stress Concern Present (04/18/2018)   [place]    Feeling of Stress : Not at all  Social Connections: Moderately Isolated (04/18/2018)   Social Connection and Isolation Panel [NHANES]    Frequency of Communication with Friends and Family: More than three times a week    Frequency of Social Gatherings with Friends and Family: More than three times a week    Attends Religious  Services: Never    Marine scientist or Organizations: No    Attends Music therapist: Never    Marital Status: Divorced    Tobacco Counseling Counseling given: Not Answered   Clinical Intake:  Pre-visit preparation completed: No  Pain : No/denies pain     BMI - recorded: 21.79 Nutritional Status: BMI of 19-24  Normal Nutritional Risks: None Diabetes: No  How often do you need to have someone help you when you read instructions, pamphlets, or other written materials from your doctor or pharmacy?: 1 - Never What is the last grade level you completed in school?: One year college  Diabetic?No  Activities of Daily Living    05/05/2022    4:16 PM 05/05/2022    3:31 PM  In your present state of health, do you have any difficulty performing the following activities:  Hearing? 0 0  Vision? 0 0  Difficulty concentrating or making decisions? 0 0  Walking or climbing stairs? 0 0  Dressing or bathing? 0 0  Doing errands, shopping? 0 0  Preparing Food and eating ? N   Using the Toilet? N   In the past six months, have you accidently leaked urine? N   Do you have problems with loss of bowel control? N   Managing your Medications? N   Managing your Finances? N   Housekeeping or managing your Housekeeping? N     Patient Care Team: Wardell Honour, MD as PCP - General (Family Medicine) Adrian Prows, MD as Consulting Physician (Cardiology) Clent Jacks, MD as Consulting Physician (Ophthalmology) Mal Misty, MD (Inactive) as Consulting Physician (Vascular Surgery) Garvin Fila, MD as Consulting Physician (Neurology) Hayden Pedro, MD as Consulting Physician (Ophthalmology)  Indicate any recent Medical Services you may have received from other than [facility] providers in the past year (date may be approximate).     Assessment:   This is a routine wellness examination for [place].  Hearing/Vision screen Hearing Screening - Comments:: No  hearing issues Vision Screening - Comments:: Last eye exam less than 12 months ago with Dr. Katy Fitch  Dietary issues and exercise activities discussed: Current Exercise Habits: Home exercise routine, Type of exercise: strength training/weights, Time (Minutes): 15, Frequency (Times/Week): 4, Weekly Exercise (Minutes/Week): 60, Intensity: Mild, Exercise limited by: None identified   Goals Addressed             This Visit's Progress    Maintain lifestyle   On track    Patient will maintain Lifestyle       Depression Screen    05/05/2022    3:46 PM 05/05/2022    3:30 PM 05/02/2021    9:28 AM 07/22/2020   10:13 AM 04/30/2020    9:20 AM 03/21/2020   10:22 AM 08/24/2019   11:34 AM  PHQ 2/9 Scores  PHQ - 2 Score 0 0 0 0 0 0 0  PHQ- 9 Score 0 0         Fall Risk    05/05/2022    3:46 PM 05/05/2022    3:29 PM 07/04/2021   11:10 AM 05/06/2021    9:38 AM 05/02/2021    9:28 AM  [place] in the past year? 0 0 0 0 0  Number falls in past yr: 0 0 0 0 0  Injury with Fall? 0 0 0 0 0  Risk for fall due to : No Fall Risks No Fall Risks No Fall Risks History of fall(s) No Fall Risks  Follow up Falls evaluation completed Falls evaluation completed Falls evaluation completed;Education provided;Falls prevention discussed Falls evaluation completed;Education provided;Falls prevention discussed Falls evaluation completed    FALL RISK PREVENTION PERTAINING TO THE HOME:  Any stairs in or around the home? Yes  If so, are there any without handrails? Yes  Home free of loose throw rugs in walkways, pet beds, electrical cords, etc? No  Adequate lighting in your home to reduce risk of falls? Yes   ASSISTIVE DEVICES UTILIZED TO PREVENT FALLS:  Life alert? No  Use of a cane, walker or w/c? No  Grab bars in the bathroom? No  Shower chair  or bench in shower? No  Elevated toilet seat or a handicapped toilet? No   TIMED UP AND GO:  Was the test performed? No .  Length of time to ambulate 10  feet: N/A sec.   Gait slow and steady without use of assistive device  Cognitive Function:    04/18/2018    9:58 AM 03/10/2017    9:16 AM 03/26/2016    9:21 AM  MMSE - Mini Mental State Exam  Orientation to time '5 5 5  '$ Orientation to Place '4 5 5  '$ Registration '3 3 3  '$ Attention/ Calculation '5 5 5  '$ Recall '1 2 2  '$ Language- name 2 objects '2 2 2  '$ Language- repeat '1 1 1  '$ Language- follow 3 step command '3 3 2  '$ Language- read & follow direction '1 1 1  '$ Write a sentence '1 1 1  '$ Copy design '1 1 1  '$ Total score '27 29 28        '$ 05/05/2022    3:33 PM 05/02/2021    9:29 AM 04/30/2020    9:21 AM 04/25/2019    9:15 AM  6CIT Screen  What Year? 0 points 0 points 0 points 0 points  What month? 0 points 0 points 0 points 0 points  What time? 0 points 0 points 0 points 0 points  Count back from 20 0 points 0 points 0 points 0 points  Months in reverse  0 points 0 points 0 points  Repeat phrase 0 points 4 points 6 points   Total Score  4 points 6 points     Immunizations Immunization History  Administered Date(s) Administered   Fluad Quad(high Dose 65+) 04/03/2019, 07/22/2020, 05/06/2021   Influenza Split 04/29/2010   Influenza, High Dose Seasonal PF 07/22/2017, 04/18/2018   Influenza,inj,Quad PF,6+ Mos 06/05/2013, 07/09/2014, 06/14/2015   PFIZER Comirnaty(Gray Top)Covid-19 Tri-Sucrose Vaccine 10/04/2020   PFIZER(Purple Top)SARS-COV-2 Vaccination 03/02/2020, 04/02/2020   Pneumococcal Conjugate-13 10/02/2013   Pneumococcal Polysaccharide-23 11/05/2011   Tdap 04/25/2019    TDAP status: Up to date  Flu Vaccine status: Due, Education has been provided regarding the importance of this vaccine. Advised may receive this vaccine at local pharmacy or Health Dept. Aware to provide a copy of the vaccination record if obtained from local pharmacy or Health Dept. Verbalized acceptance and understanding.  Pneumococcal vaccine status: Up to date  Covid-19 vaccine status: Information provided on how to  obtain vaccines.   Qualifies for Shingles Vaccine? Yes   Zostavax completed No   Shingrix Completed?: No.    Education has been provided regarding the importance of this vaccine. Patient has been advised to call insurance company to determine out of pocket expense if they have not yet received this vaccine. Advised may also receive vaccine at local pharmacy or Health Dept. Verbalized acceptance and understanding.  Screening Tests Health Maintenance  Topic Date Due   Zoster Vaccines- Shingrix (1 of 2) Never done   COVID-19 Vaccine (4 - Pfizer series) 11/29/2020   INFLUENZA VACCINE  03/03/2022   TETANUS/TDAP  04/24/2029   Pneumonia Vaccine 55+ Years old  Completed   HPV VACCINES  Aged Out    Health Maintenance  Health Maintenance Due  Topic Date Due   Zoster Vaccines- Shingrix (1 of 2) Never done   COVID-19 Vaccine (4 - Pfizer series) 11/29/2020   INFLUENZA VACCINE  03/03/2022    Colorectal cancer screening: No longer required.   Lung Cancer Screening: (Low Dose CT Chest recommended if Age 75-80 years, 30 pack-year  currently smoking OR have quit w/in 15years.) does not qualify.   Lung Cancer Screening Referral: No   Additional Screening:  Hepatitis C Screening: does not qualify; Completed No   Vision Screening: Recommended annual ophthalmology exams for early detection of glaucoma and other disorders of the eye. Is the patient up to date with their annual eye exam?  Yes  Who is the provider or what is the name of the office in which the patient attends annual eye exams? Dr. Katy Fitch  If pt is not established with a provider, would they like to be referred to a provider to establish care? No .   Dental Screening: Recommended annual dental exams for proper oral hygiene  Community Resource Referral / Chronic Care Management: CRR required this visit?  No   CCM required this visit?  No      Plan:     I have personally reviewed and noted the following in the patient's chart:    Medical and social history Use of alcohol, tobacco or illicit drugs  Current medications and supplements including opioid prescriptions. Patient is not currently taking opioid prescriptions. Functional ability and status Nutritional status Physical activity Advanced directives List of other physicians Hospitalizations, surgeries, and ER visits in previous 12 months Vitals Screenings to include cognitive, depression, and falls Referrals and appointments  In addition, I have reviewed and discussed with patient certain preventive protocols, quality metrics, and best practice recommendations. A written personalized care plan for preventive services as well as general preventive health recommendations were provided to patient.     Sandrea Hughs, NP   05/05/2022   Nurse Notes: Advised to get shingles and COVID-19 booster vaccine at the pharmacy.will schedule appointment for Influenza vaccine at [facility] office.

## 2022-05-05 NOTE — Patient Instructions (Signed)
[name] , Thank you for taking time to come for your Medicare Wellness Visit. I appreciate your ongoing commitment to your health goals. Please review the following plan we discussed and let me know if I can assist you in the future.   Screening recommendations/referrals: Colonoscopy : N/A  Recommended yearly ophthalmology/optometry visit for glaucoma screening and checkup Recommended yearly dental visit for hygiene and checkup  Vaccinations: Influenza vaccine due annually in September/October Pneumococcal vaccine : Up to date  Tdap vaccine  : Up to date  Shingles vaccine : Please get vaccine at your pharmacy     Advanced directives: No   Conditions/risks identified: Cardiac Risk Factors include: advanced age (>8mn, >>74women);hypertension;male gender;smoking/ tobacco exposure  Next appointment: 1 year   Preventive Care 660Years and Older, Male Preventive care refers to lifestyle choices and visits with your health care provider that can promote health and wellness. What does preventive care include? A yearly physical exam. This is also called an annual well check. Dental exams once or twice a year. Routine eye exams. Ask your health care provider how often you should have your eyes checked. Personal lifestyle choices, including: Daily care of your teeth and gums. Regular physical activity. Eating a healthy diet. Avoiding tobacco and drug use. Limiting alcohol use. Practicing safe sex. Taking low doses of aspirin every day. Taking vitamin and mineral supplements as recommended by your health care provider. What happens during an annual well check? The services and screenings done by your health care provider during your annual well check will depend on your age, overall health, lifestyle risk factors, and family history of disease. Counseling  Your health care provider may ask you questions about your: Alcohol use. Tobacco use. Drug use. Emotional well-being. Home and  relationship well-being. Sexual activity. Eating habits. History of falls. Memory and ability to understand (cognition). Work and work eStatistician Screening  You may have the following tests or measurements: Height, weight, and BMI. Blood pressure. Lipid and cholesterol levels. These may be checked every 5 years, or more frequently if you are over 551years old. Skin check. Lung cancer screening. You may have this screening every year starting at age 77865if you have a 30-pack-year history of smoking and currently smoke or have quit within the past 15 years. Fecal occult blood test (FOBT) of the stool. You may have this test every year starting at age 77877 Flexible sigmoidoscopy or colonoscopy. You may have a sigmoidoscopy every 5 years or a colonoscopy every 10 years starting at age 77874 Prostate cancer screening. Recommendations will vary depending on your family history and other risks. Hepatitis C blood test. Hepatitis B blood test. Sexually transmitted disease (STD) testing. Diabetes screening. This is done by checking your blood sugar (glucose) after you have not eaten for a while (fasting). You may have this done every 1-3 years. Abdominal aortic aneurysm (AAA) screening. You may need this if you are a current or former smoker. Osteoporosis. You may be screened starting at age 763if you are at high risk. Talk with your health care provider about your test results, treatment options, and if necessary, the need for more tests. Vaccines  Your health care provider may recommend certain vaccines, such as: Influenza vaccine. This is recommended every year. Tetanus, diphtheria, and acellular pertussis (Tdap, Td) vaccine. You may need a Td booster every 10 years. Zoster vaccine. You may need this after age 81 Pneumococcal 13-valent conjugate (PCV13) vaccine. One dose is recommended after age 81  Pneumococcal polysaccharide (PPSV23) vaccine. One dose is recommended after age 4. Talk to your  health care provider about which screenings and vaccines you need and how often you need them. This information is not intended to replace advice given to you by your health care provider. Make sure you discuss any questions you have with your health care provider. Document Released: 08/16/2015 Document Revised: 04/08/2016 Document Reviewed: 05/21/2015 Elsevier Interactive Patient Education  2017 [place] Prevention in the Home Falls can cause injuries. They can happen to people of all ages. There are many things you can do to make your home safe and to help prevent falls. What can I do on the outside of my home? Regularly fix the edges of walkways and driveways and fix any cracks. Remove anything that might make you trip as you walk through a door, such as a raised step or threshold. Trim any bushes or trees on the path to your home. Use bright outdoor lighting. Clear any walking paths of anything that might make someone trip, such as rocks or tools. Regularly check to see if handrails are loose or broken. Make sure that both sides of any steps have handrails. Any raised decks and porches should have guardrails on the edges. Have any leaves, snow, or ice cleared regularly. Use sand or salt on walking paths during winter. Clean up any spills in your garage right away. This includes oil or grease spills. What can I do in the bathroom? Use night lights. Install grab bars by the toilet and in the tub and shower. Do not use towel bars as grab bars. Use non-skid mats or decals in the tub or shower. If you need to sit down in the shower, use a plastic, non-slip stool. Keep the floor dry. Clean up any water that spills on the floor as soon as it happens. Remove soap buildup in the tub or shower regularly. Attach bath mats securely with double-sided non-slip rug tape. Do not have throw rugs and other things on the floor that can make you trip. What can I do in the bedroom? Use night  lights. Make sure that you have a light by your bed that is easy to reach. Do not use any sheets or blankets that are too big for your bed. They should not hang down onto the floor. Have a firm chair that has side arms. You can use this for support while you get dressed. Do not have throw rugs and other things on the floor that can make you trip. What can I do in the kitchen? Clean up any spills right away. Avoid walking on wet floors. Keep items that you use a lot in easy-to-reach places. If you need to reach something above you, use a strong step stool that has a grab bar. Keep electrical cords out of the way. Do not use floor polish or wax that makes floors slippery. If you must use wax, use non-skid floor wax. Do not have throw rugs and other things on the floor that can make you trip. What can I do with my stairs? Do not leave any items on the stairs. Make sure that there are handrails on both sides of the stairs and use them. Fix handrails that are broken or loose. Make sure that handrails are as long as the stairways. Check any carpeting to make sure that it is firmly attached to the stairs. Fix any carpet that is loose or worn. Avoid having throw rugs at the  top or bottom of the stairs. If you do have throw rugs, attach them to the floor with carpet tape. Make sure that you have a light switch at the top of the stairs and the bottom of the stairs. If you do not have them, ask someone to add them for you. What else can I do to help prevent falls? Wear shoes that: Do not have high heels. Have rubber bottoms. Are comfortable and fit you well. Are closed at the toe. Do not wear sandals. If you use a stepladder: Make sure that it is fully opened. Do not climb a closed stepladder. Make sure that both sides of the stepladder are locked into place. Ask someone to hold it for you, if possible. Clearly mark and make sure that you can see: Any grab bars or handrails. First and last  steps. Where the edge of each step is. Use tools that help you move around (mobility aids) if they are needed. These include: Canes. Walkers. Scooters. Crutches. Turn on the lights when you go into a dark area. Replace any light bulbs as soon as they burn out. Set up your furniture so you have a clear path. Avoid moving your furniture around. If any of your floors are uneven, fix them. If there are any pets around you, be aware of where they are. Review your medicines with your doctor. Some medicines can make you feel dizzy. This can increase your chance of falling. Ask your doctor what other things that you can do to help prevent falls. This information is not intended to replace advice given to you by your health care provider. Make sure you discuss any questions you have with your health care provider. Document Released: 05/16/2009 Document Revised: 12/26/2015 Document Reviewed: 08/24/2014 Elsevier Interactive Patient Education  2017 [employer].

## 2022-05-05 NOTE — Telephone Encounter (Signed)
Mr. chapman, matteucci are scheduled for a virtual visit with your provider today.    Just as we do with appointments in the office, we must obtain your consent to participate.  Your consent will be active for this visit and any virtual visit you may have with one of our providers in the next 365 days.    If you have a MyChart account, I can also send a copy of this consent to you electronically.  All virtual visits are billed to your insurance company just like a traditional visit in the office.  As this is a virtual visit, video technology does not allow for your provider to perform a traditional examination.  This may limit your provider's ability to fully assess your condition.  If your provider identifies any concerns that need to be evaluated in person or the need to arrange testing such as labs, EKG, etc, we will make arrangements to do so.    Although advances in technology are sophisticated, we cannot ensure that it will always work on either your end or our end.  If the connection with a video visit is poor, we may have to switch to a telephone visit.  With either a video or telephone visit, we are not always able to ensure that we have a secure connection.   I need to obtain your verbal consent now.   Are you willing to proceed with your visit today?   CALEN GEISTER has provided verbal consent on 05/05/2022 for a virtual visit (video or telephone).   Veneda Melter, [place] 05/05/2022  3:55 PM  .

## 2022-05-12 ENCOUNTER — Ambulatory Visit (INDEPENDENT_AMBULATORY_CARE_PROVIDER_SITE_OTHER): Payer: Medicare HMO

## 2022-05-12 DIAGNOSIS — Z23 Encounter for immunization: Secondary | ICD-10-CM | POA: Diagnosis not present

## 2022-05-21 ENCOUNTER — Other Ambulatory Visit: Payer: Self-pay | Admitting: Cardiology

## 2022-05-21 ENCOUNTER — Other Ambulatory Visit: Payer: Self-pay | Admitting: Family Medicine

## 2022-05-21 DIAGNOSIS — E7800 Pure hypercholesterolemia, unspecified: Secondary | ICD-10-CM

## 2022-05-27 ENCOUNTER — Ambulatory Visit (INDEPENDENT_AMBULATORY_CARE_PROVIDER_SITE_OTHER): Payer: Medicare HMO | Admitting: Family Medicine

## 2022-05-27 ENCOUNTER — Encounter: Payer: Self-pay | Admitting: Family Medicine

## 2022-05-27 VITALS — BP 140/60 | HR 87 | Temp 97.8°F | Resp 18 | Ht 71.0 in | Wt 154.0 lb

## 2022-05-27 DIAGNOSIS — N1832 Chronic kidney disease, stage 3b: Secondary | ICD-10-CM

## 2022-05-27 DIAGNOSIS — I10 Essential (primary) hypertension: Secondary | ICD-10-CM

## 2022-05-27 DIAGNOSIS — I779 Disorder of arteries and arterioles, unspecified: Secondary | ICD-10-CM | POA: Diagnosis not present

## 2022-05-27 DIAGNOSIS — G3184 Mild cognitive impairment, so stated: Secondary | ICD-10-CM

## 2022-05-27 DIAGNOSIS — I350 Nonrheumatic aortic (valve) stenosis: Secondary | ICD-10-CM | POA: Diagnosis not present

## 2022-05-27 NOTE — Progress Notes (Signed)
Provider:  Alain Honey, MD  Careteam: Patient Care Team: Wardell Honour, MD as PCP - General (Family Medicine) Adrian Prows, MD as Consulting Physician (Cardiology) Clent Jacks, MD as Consulting Physician (Ophthalmology) Mal Misty, MD (Inactive) as Consulting Physician (Vascular Surgery) Garvin Fila, MD as Consulting Physician (Neurology) Hayden Pedro, MD as Consulting Physician (Ophthalmology)  PLACE OF SERVICE:  [place]  Advanced Directive information    Allergies  Allergen Reactions   Penicillins Other (See Comments)    Unknown  Has patient had a PCN reaction causing immediate rash, facial/tongue/throat swelling, SOB or lightheadedness with hypotension: {unknown Has patient had a PCN reaction causing severe rash involving mucus membranes or skin necrosis: unknown Has patient had a PCN reaction that required hospitalization {unknown Has patient had a PCN reaction occurring within the last 10 years: no If all of the above answers are "NO", then may proceed with Cephalosporin use.    Chief Complaint  Patient presents with   Acute Visit    Patient is here to discuss his DL     HPI: Patient is a 81 y.o. male here for further discussion regarding restrictions on his driver's license.  We have been around with this issue.  He is accompanied by his daughter today.  They bring a letter from Department of Transportation citing several concerns.  Paperwork that was filled out was largely incorrect.  He did have a stroke back in 2008 but has no significant sequelae.  There is slight weakness right upper and lower extremity compared to left but just minimally noticed. Loss of consciousness was related to medications he been prescribed by this office.  Since medications have been stopped he has had no further episodes of syncope.  He does however have aortic stenosis which can also be a cause of syncope He has a history of paralysis after the stroke but he has  no evidence of paralysis at this time.  Neurologic exam was done Cardiovascular disease has seen cardiologist last month.  He does have moderate aortic stenosis as well as regurgitation.  He has peripheral artery arch occlusive disease.  He is felt to be stable from cardiac standpoint  Review of Systems:  Review of Systems  Constitutional: Negative.   HENT: Negative.    Eyes: Negative.   Respiratory: Negative.    Cardiovascular: Negative.   Genitourinary: Negative.   Musculoskeletal:  Positive for back pain.  Skin: Negative.   Psychiatric/Behavioral:  Negative for depression, memory loss and substance abuse.   All other systems reviewed and are negative.   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 ([place]) 12/11/2018   Hyperlipidemia    Hypertension    Hypertensive kidney disease, benign    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)    Pure hypercholesterolemia    Unspecified vitamin D deficiency    Past Surgical History:  Procedure Laterality Date   CATARACT EXTRACTION     EYE SURGERY     left eye,    cornea repair   PERIPHERAL VASCULAR CATHETERIZATION N/A 07/28/2016   Procedure: Lower Extremity Angiography;  Surgeon: Adrian Prows, MD;  Location: [facility];  Service: Cardiovascular;  Laterality: N/A;   Social History:   reports that he quit smoking about 51 years ago. His smoking use included cigarettes. He has a 6.50 pack-year smoking history. He has never used smokeless tobacco. He reports that he does not drink alcohol and does not use drugs.  Family History  Problem Relation Age of Onset   Cancer Father      Medications: Patient's Medications  New Prescriptions   No medications on file  Previous Medications   AMLODIPINE (NORVASC) 5 MG TABLET    TAKE 1 TABLET EVERY DAY   ASPIRIN 81 MG TABLET    Take 81 mg by mouth daily.   CALCIUM CARBONATE-VIT D-MIN (CALTRATE 600+D PLUS PO)    Take by mouth. 1 tablet 2 times a day   CAMPHOR-MENTHOL (SARNA) LOTION    Apply 1 application topically as needed for itching.   DAPAGLIFLOZIN PROPANEDIOL (FARXIGA) 10 MG TABS TABLET    Take 1 tablet (10 mg total) by mouth daily before breakfast.   EZETIMIBE (ZETIA) 10 MG TABLET    TAKE 1 TABLET EVERY DAY   FUROSEMIDE (LASIX) 40 MG TABLET    Take 40 mg by mouth every Monday, Wednesday, and Friday.   LATANOPROST (XALATAN) 0.005 % OPHTHALMIC SOLUTION    Place 1 drop into both eyes at bedtime.    LOSARTAN (COZAAR) 100 MG TABLET    TAKE 1 TABLET EVERY DAY   MULTIPLE VITAMINS-MINERALS (HEALTHY EYES/LUTEIN) TABS    Take 1 tablet by mouth daily.   ROSUVASTATIN (CRESTOR) 40 MG TABLET    TAKE 1 TABLET EVERY DAY (DISCONTINUE PRAVASTATIN)   TIMOLOL (BETIMOL) 0.5 % OPHTHALMIC SOLUTION    Place 1 drop into both eyes every morning.    TRIAMCINOLONE 0.1% OINT-CERAVE EQUIVALENT LOTION 1:1 MIXTURE    Apply topically 2 (two) times daily.  Modified Medications   No medications on file  Discontinued Medications   No medications on file    Physical Exam:  Vitals:   05/27/22 1143  BP: (!) 140/60  Pulse: 87  Resp: 18  Temp: 97.8 F (36.6 C)  SpO2: 98%  Weight: 154 lb (69.9 kg)  Height: '5\' 11"'$  (1.803 m)   Body mass index is 21.48 kg/m. Wt Readings from Last 3 Encounters:  05/27/22 154 lb (69.9 kg)  04/20/22 156 lb 3.2 oz (70.9 kg)  01/13/22 157 lb 9.6 oz (71.5 kg)    Physical Exam Vitals and nursing note reviewed.  Constitutional:      Appearance: Normal appearance.  Cardiovascular:     Rate and Rhythm: Normal rate and regular rhythm.     Heart sounds: Murmur heard.  Pulmonary:     Effort: Pulmonary effort is  normal.     Breath sounds: Normal breath sounds.  Neurological:     General: No focal deficit present.     Mental Status: He is alert and oriented to person, place, and time.  Psychiatric:        Mood and Affect: Mood normal.        Behavior: Behavior normal.     Labs reviewed: Basic Metabolic Panel: Recent Labs    01/13/22 1155 02/24/22 1052 04/04/22 0000  NA 137 138 140  K 5.2 4.9 4.8  CL 104 103 105  CO2 '24 27 21  '$ GLUCOSE 82 117  --   BUN 53* 47* 47*  CREATININE 2.58* 2.51* 1.9*  CALCIUM 9.6 10.1 9.5   Liver Function Tests: Recent Labs  01/13/22 1155 02/24/22 1052 04/04/22 0000  AST 22 18  --   ALT 17 15  --   BILITOT 0.4 0.4  --   PROT 7.6 7.8  --   ALBUMIN  --   --  4.3   No results for input(s): "LIPASE", "AMYLASE" in the last 8760 hours. No results for input(s): "AMMONIA" in the last 8760 hours. CBC: No results for input(s): "WBC", "NEUTROABS", "HGB", "HCT", "MCV", "PLT" in the last 8760 hours. Lipid Panel: Recent Labs    01/13/22 1155  CHOL 105  HDL 45  LDLCALC 45  TRIG 70  CHOLHDL 2.3   TSH: No results for input(s): "TSH" in the last 8760 hours. A1C: Lab Results  Component Value Date   HGBA1C 6.2 (H) 01/13/2022     Assessment/Plan  1. Nonrheumatic aortic valve stenosis Followed by cardiology.  Cardiologist feels like he is stable and his valvular heart disease does not present a problem  2. Stage 3b chronic kidney disease ([place]) Followed by nephrology.  He has recently been placed on Farxiga to try and stabilize his renal function  3. Essential hypertension Blood pressure today is good at 140/60 on losartan and amlodipine  4. Mild cognitive impairment with memory loss I cannot find copy of MMSE which his daughter says he had within the last 2 years.  I did ask him a simple question I posed to people who have memory loss and that is what did you eat for supper last night.  He probably tell me and daughter verified generally well this  is not a thyroid test of memory I think it screens pretty well people who have mild cognitive defects and he passed this with flying colors  They will obtain another form from the department of transportation about driver's license.  At this point I do not see any reason to keep him from driving without restrictions   5. PAOD (peripheral arterial occlusive disease) (HCC) Pulses are absent.  There are no balance issues or wounds   Alain Honey, MD [place] [phone]

## 2022-06-17 NOTE — Progress Notes (Signed)
This letter to the state of [facility] Department of Transportation To whom it may concern: I am attaching this letter to his new application for reinstatement of a driver's license without restriction.  After extensive review of previous forms and interviewing and examining the patient I have concluded that the previous form did not have the correct current medical updates or information for Mr. Erie Noe, Sr.  I have now completed an updated and corrected form for Mr. Schwan.  Attached you will find this new state of [facility] Department of Transportation medical report If there are further questions regarding the above please feel free to contact me Sincerely Wenda Low. Sabra Heck, MD

## 2022-06-18 ENCOUNTER — Telehealth: Payer: Self-pay | Admitting: *Deleted

## 2022-06-18 NOTE — Telephone Encounter (Signed)
Form on Desk from DMV#[id number]. Form completed and signed by Dr. Sabra Heck but needing a letter composed.   Composed letter and Pended for Dr. Ammie Ferrier approval.   Patient to come in and sign the DMV form so once the letter is completed we can fax the paperwork to the [facility] office [phone]  Patient is wanting to pick up the paperwork and mail it to the [facility] office. Wants to be called once the paperwork is ready and they will pick up  Letter and Paperwork placed in Dr. Ammie Ferrier folder for signature on Letter. To call patient once completed to pick up.

## 2022-07-14 DIAGNOSIS — I5032 Chronic diastolic (congestive) heart failure: Secondary | ICD-10-CM | POA: Diagnosis not present

## 2022-07-14 DIAGNOSIS — I130 Hypertensive heart and chronic kidney disease with heart failure and stage 1 through stage 4 chronic kidney disease, or unspecified chronic kidney disease: Secondary | ICD-10-CM | POA: Diagnosis not present

## 2022-07-14 DIAGNOSIS — R7303 Prediabetes: Secondary | ICD-10-CM | POA: Diagnosis not present

## 2022-07-14 DIAGNOSIS — N1832 Chronic kidney disease, stage 3b: Secondary | ICD-10-CM | POA: Diagnosis not present

## 2022-07-14 LAB — COMPREHENSIVE METABOLIC PANEL
Albumin: 4.3 (ref 3.5–5.0)
Calcium: 9.9 (ref 8.7–10.7)
eGFR: 38

## 2022-07-14 LAB — BASIC METABOLIC PANEL
BUN: 47 — AB (ref 4–21)
CO2: 26 — AB (ref 13–22)
Chloride: 104 (ref 99–108)
Creatinine: 1.8 — AB (ref 0.6–1.3)
Glucose: 102
Potassium: 4.8 mEq/L (ref 3.5–5.1)
Sodium: 139 (ref 137–147)

## 2022-07-14 LAB — VITAMIN D 25 HYDROXY (VIT D DEFICIENCY, FRACTURES): Vit D, 25-Hydroxy: 46.3

## 2022-07-14 LAB — HEMOGLOBIN A1C: Hemoglobin A1C: 11.6

## 2022-07-14 LAB — PROTEIN / CREATININE RATIO, URINE: Creatinine, Urine: 118.6

## 2022-07-21 ENCOUNTER — Ambulatory Visit: Payer: Medicare HMO | Admitting: Family Medicine

## 2022-07-22 ENCOUNTER — Encounter: Payer: Self-pay | Admitting: Family Medicine

## 2022-07-22 ENCOUNTER — Ambulatory Visit (INDEPENDENT_AMBULATORY_CARE_PROVIDER_SITE_OTHER): Payer: Medicare HMO | Admitting: Family Medicine

## 2022-07-22 VITALS — BP 134/88 | HR 65 | Temp 97.3°F | Ht 71.0 in | Wt 155.4 lb

## 2022-07-22 DIAGNOSIS — D631 Anemia in chronic kidney disease: Secondary | ICD-10-CM | POA: Diagnosis not present

## 2022-07-22 DIAGNOSIS — I350 Nonrheumatic aortic (valve) stenosis: Secondary | ICD-10-CM

## 2022-07-22 DIAGNOSIS — N184 Chronic kidney disease, stage 4 (severe): Secondary | ICD-10-CM | POA: Diagnosis not present

## 2022-07-22 DIAGNOSIS — G3184 Mild cognitive impairment, so stated: Secondary | ICD-10-CM

## 2022-07-22 DIAGNOSIS — I779 Disorder of arteries and arterioles, unspecified: Secondary | ICD-10-CM

## 2022-07-22 DIAGNOSIS — I10 Essential (primary) hypertension: Secondary | ICD-10-CM | POA: Diagnosis not present

## 2022-07-22 NOTE — Progress Notes (Signed)
Provider:  Alain Honey, MD  Careteam: Patient Care Team: Wardell Honour, MD as PCP - General (Family Medicine) Adrian Prows, MD as Consulting Physician (Cardiology) Clent Jacks, MD as Consulting Physician (Ophthalmology) Mal Misty, MD (Inactive) as Consulting Physician (Vascular Surgery) Garvin Fila, MD as Consulting Physician (Neurology) Hayden Pedro, MD as Consulting Physician (Ophthalmology)  PLACE OF SERVICE:  [place]  Advanced Directive information    Allergies  Allergen Reactions   Penicillins Other (See Comments)    Unknown  Has patient had a PCN reaction causing immediate rash, facial/tongue/throat swelling, SOB or lightheadedness with hypotension: {unknown Has patient had a PCN reaction causing severe rash involving mucus membranes or skin necrosis: unknown Has patient had a PCN reaction that required hospitalization {unknown Has patient had a PCN reaction occurring within the last 10 years: no If all of the above answers are "NO", then may proceed with Cephalosporin use.    Chief Complaint  Patient presents with   Medical Management of Chronic Issues    Medical management of Chronic Issues. 6 Month follow up   Quality Metric Gaps    To discuss need for Zoster Vaccine and Covid Vaccine.      HPI: Patient is a 81 y.o. male .  Patient here to check on chronic problems.  We have submitted an application to reinstate his driver's license.  He has had no further syncopal episodes.  He does have a history of aortic stenosis.  Has some arthritic complaints which are somewhat limiting his mobility.  No issues with memory.  He has had no seizures  Review of Systems:  Review of Systems  Constitutional: Negative.   HENT: Negative.    Respiratory: Negative.    Cardiovascular: Negative.   Musculoskeletal: Negative.   Neurological: Negative.   Psychiatric/Behavioral: Negative.      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 ([place]) 12/11/2018   Hyperlipidemia    Hypertension    Hypertensive kidney disease, benign    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)    Pure hypercholesterolemia    Unspecified vitamin D deficiency    Past Surgical History:  Procedure Laterality Date   CATARACT EXTRACTION     EYE SURGERY     left eye,    cornea repair   PERIPHERAL VASCULAR CATHETERIZATION N/A 07/28/2016   Procedure: Lower Extremity Angiography;  Surgeon: Adrian Prows, MD;  Location: [place] [facility];  Service: Cardiovascular;  Laterality: N/A;   Social History:   reports that he quit smoking about 51 years ago. His smoking use included cigarettes. He has a 6.50 pack-year smoking history. He has never used smokeless tobacco. He reports that he does not drink alcohol and does not use drugs.  Family History  Problem Relation Age of Onset   Cancer Father     Medications: Patient's Medications  New Prescriptions   No medications on file  Previous Medications   AMLODIPINE (NORVASC) 5 MG TABLET    TAKE 1 TABLET EVERY DAY   ASPIRIN 81 MG TABLET    Take 81 mg by mouth daily.   CALCIUM  CARBONATE-VIT D-MIN (CALTRATE 600+D PLUS PO)    Take by mouth. 1 tablet 2 times a day   CAMPHOR-MENTHOL (SARNA) LOTION    Apply 1 application topically as needed for itching.   DAPAGLIFLOZIN PROPANEDIOL (FARXIGA) 10 MG TABS TABLET    Take 1 tablet (10 mg total) by mouth daily before breakfast.   EZETIMIBE (ZETIA) 10 MG TABLET    TAKE 1 TABLET EVERY DAY   FUROSEMIDE (LASIX) 40 MG TABLET    Take 40 mg by mouth every Monday, Wednesday, and Friday.   LATANOPROST (XALATAN) 0.005 % OPHTHALMIC SOLUTION    Place 1  drop into both eyes at bedtime.    LOSARTAN (COZAAR) 100 MG TABLET    TAKE 1 TABLET EVERY DAY   MULTIPLE VITAMINS-MINERALS (HEALTHY EYES/LUTEIN) TABS    Take 1 tablet by mouth daily.   ROSUVASTATIN (CRESTOR) 40 MG TABLET    TAKE 1 TABLET EVERY DAY (DISCONTINUE PRAVASTATIN)   TIMOLOL (BETIMOL) 0.5 % OPHTHALMIC SOLUTION    Place 1 drop into both eyes every morning.    TRIAMCINOLONE 0.1% OINT-CERAVE EQUIVALENT LOTION 1:1 MIXTURE    Apply topically 2 (two) times daily.  Modified Medications   No medications on file  Discontinued Medications   No medications on file    Physical Exam:  Vitals:   07/22/22 1000  BP: 134/88  Pulse: 65  Temp: (!) 97.3 F (36.3 C)  SpO2: 99%  Weight: 155 lb 6.4 oz (70.5 kg)  Height: '5\' 11"'$  (1.803 m)   Body mass index is 21.67 kg/m. Wt Readings from Last 3 Encounters:  07/22/22 155 lb 6.4 oz (70.5 kg)  05/27/22 154 lb (69.9 kg)  04/20/22 156 lb 3.2 oz (70.9 kg)    Physical Exam Vitals and nursing note reviewed.  Constitutional:      Appearance: Normal appearance.  Cardiovascular:     Rate and Rhythm: Normal rate. Rhythm irregular.     Heart sounds: Murmur heard.  Pulmonary:     Effort: Pulmonary effort is normal.     Breath sounds: Normal breath sounds.  Abdominal:     General: Bowel sounds are normal.     Palpations: Abdomen is soft.  Skin:    General: Skin is warm and dry.  Neurological:     General: No focal deficit present.     Mental Status: He is alert and oriented to person, place, and time.     Labs reviewed: Basic Metabolic Panel: Recent Labs    01/13/22 1155 02/24/22 1052 04/04/22 0000  NA 137 138 140  K 5.2 4.9 4.8  CL 104 103 105  CO2 '24 27 21  '$ GLUCOSE 82 117  --   BUN 53* 47* 47*  CREATININE 2.58* 2.51* 1.9*  CALCIUM 9.6 10.1 9.5   Liver Function Tests: Recent Labs    01/13/22 1155 02/24/22 1052 04/04/22 0000  AST 22 18  --   ALT 17 15  --   BILITOT 0.4 0.4  --   PROT 7.6 7.8  --   ALBUMIN  --   --  4.3    No results for input(s): "LIPASE", "AMYLASE" in the last 8760 hours. No results for input(s): "AMMONIA" in the last 8760 hours. CBC: No results for input(s): "WBC", "NEUTROABS", "HGB", "HCT", "MCV", "PLT" in the last 8760 hours. Lipid Panel: Recent Labs    01/13/22 1155  CHOL 105  HDL 45  LDLCALC 45  TRIG 70  CHOLHDL 2.3   TSH: No results for input(s): "TSH" in  the last 8760 hours. A1C: Lab Results  Component Value Date   HGBA1C 6.2 (H) 01/13/2022     Assessment/Plan  1. Anemia in stage 4 chronic kidney disease ([place]) Now followed by nephrology.  I have not heard about recent test results from last week but he was told that things are stable  2. Nonrheumatic aortic valve stenosis No change in quality of murmur and no recent syncope  3. Essential hypertension Blood pressure controlled at 134/88.  Continue same  4. PAOD (peripheral arterial occlusive disease) (HCC) No complaints of pain numbness tingling or edema in lower extremities  5. Mild cognitive impairment with memory loss Patient is appropriate and answers in conversation today.  If there is cognitive impairment it is very mild and should not affect his ability to drive   Alain Honey, MD [place] [phone]

## 2022-07-23 ENCOUNTER — Encounter: Payer: Self-pay | Admitting: Family Medicine

## 2022-07-23 NOTE — Progress Notes (Signed)
1/21

## 2022-07-24 ENCOUNTER — Other Ambulatory Visit (HOSPITAL_BASED_OUTPATIENT_CLINIC_OR_DEPARTMENT_OTHER): Payer: Self-pay

## 2022-07-24 MED ORDER — COMIRNATY 30 MCG/0.3ML IM SUSY
PREFILLED_SYRINGE | INTRAMUSCULAR | 0 refills | Status: DC
  Filled 2022-07-24: qty 0.3, 1d supply, fill #0

## 2022-08-03 ENCOUNTER — Other Ambulatory Visit: Payer: Self-pay | Admitting: Family Medicine

## 2022-10-07 ENCOUNTER — Encounter (INDEPENDENT_AMBULATORY_CARE_PROVIDER_SITE_OTHER): Payer: Medicare HMO | Admitting: Ophthalmology

## 2022-10-07 DIAGNOSIS — H35342 Macular cyst, hole, or pseudohole, left eye: Secondary | ICD-10-CM

## 2022-10-07 DIAGNOSIS — H43811 Vitreous degeneration, right eye: Secondary | ICD-10-CM | POA: Diagnosis not present

## 2022-10-07 DIAGNOSIS — H35033 Hypertensive retinopathy, bilateral: Secondary | ICD-10-CM

## 2022-10-07 DIAGNOSIS — I10 Essential (primary) hypertension: Secondary | ICD-10-CM

## 2022-10-08 DIAGNOSIS — Z0100 Encounter for examination of eyes and vision without abnormal findings: Secondary | ICD-10-CM | POA: Diagnosis not present

## 2022-10-08 DIAGNOSIS — H401112 Primary open-angle glaucoma, right eye, moderate stage: Secondary | ICD-10-CM | POA: Diagnosis not present

## 2022-10-08 DIAGNOSIS — H2511 Age-related nuclear cataract, right eye: Secondary | ICD-10-CM | POA: Diagnosis not present

## 2022-10-08 DIAGNOSIS — Z9889 Other specified postprocedural states: Secondary | ICD-10-CM | POA: Diagnosis not present

## 2022-10-08 DIAGNOSIS — Z961 Presence of intraocular lens: Secondary | ICD-10-CM | POA: Diagnosis not present

## 2022-10-14 ENCOUNTER — Other Ambulatory Visit: Payer: Self-pay | Admitting: Family Medicine

## 2022-10-14 NOTE — Telephone Encounter (Signed)
Spoke with [name], patients daughter about refill request for Lasix 20 mg take 1 by mouth twice daily.  Per Jonelle Sidle Dr.Foster [facility]) has patient taking a 20 mg tablet, 2 by mouth on M, W, F.  [name] was informed we will deny the request from [facility] for the wrong instructions and dosage with the indication to contact Dr.Foster.

## 2022-10-23 ENCOUNTER — Other Ambulatory Visit: Payer: Self-pay | Admitting: Cardiology

## 2022-11-10 ENCOUNTER — Emergency Department (HOSPITAL_COMMUNITY): Payer: Medicare HMO

## 2022-11-10 ENCOUNTER — Other Ambulatory Visit: Payer: Self-pay

## 2022-11-10 ENCOUNTER — Inpatient Hospital Stay (HOSPITAL_COMMUNITY)
Admission: EM | Admit: 2022-11-10 | Discharge: 2022-11-14 | DRG: 378 | Disposition: A | Discharge status: Home Health | Payer: Medicare HMO | Attending: Internal Medicine | Admitting: Internal Medicine

## 2022-11-10 ENCOUNTER — Telehealth: Payer: Self-pay

## 2022-11-10 DIAGNOSIS — K5731 Diverticulosis of large intestine without perforation or abscess with bleeding: Secondary | ICD-10-CM | POA: Diagnosis not present

## 2022-11-10 DIAGNOSIS — G8929 Other chronic pain: Secondary | ICD-10-CM | POA: Diagnosis present

## 2022-11-10 DIAGNOSIS — K648 Other hemorrhoids: Secondary | ICD-10-CM | POA: Diagnosis present

## 2022-11-10 DIAGNOSIS — N179 Acute kidney failure, unspecified: Secondary | ICD-10-CM | POA: Insufficient documentation

## 2022-11-10 DIAGNOSIS — K635 Polyp of colon: Secondary | ICD-10-CM | POA: Diagnosis present

## 2022-11-10 DIAGNOSIS — N401 Enlarged prostate with lower urinary tract symptoms: Secondary | ICD-10-CM | POA: Diagnosis present

## 2022-11-10 DIAGNOSIS — N183 Chronic kidney disease, stage 3 unspecified: Secondary | ICD-10-CM | POA: Diagnosis not present

## 2022-11-10 DIAGNOSIS — K922 Gastrointestinal hemorrhage, unspecified: Secondary | ICD-10-CM | POA: Diagnosis not present

## 2022-11-10 DIAGNOSIS — M79604 Pain in right leg: Secondary | ICD-10-CM | POA: Diagnosis not present

## 2022-11-10 DIAGNOSIS — I5032 Chronic diastolic (congestive) heart failure: Secondary | ICD-10-CM | POA: Diagnosis present

## 2022-11-10 DIAGNOSIS — E1122 Type 2 diabetes mellitus with diabetic chronic kidney disease: Secondary | ICD-10-CM | POA: Diagnosis not present

## 2022-11-10 DIAGNOSIS — D630 Anemia in neoplastic disease: Secondary | ICD-10-CM | POA: Diagnosis not present

## 2022-11-10 DIAGNOSIS — Z7984 Long term (current) use of oral hypoglycemic drugs: Secondary | ICD-10-CM

## 2022-11-10 DIAGNOSIS — K5730 Diverticulosis of large intestine without perforation or abscess without bleeding: Secondary | ICD-10-CM | POA: Diagnosis not present

## 2022-11-10 DIAGNOSIS — Z1152 Encounter for screening for COVID-19: Secondary | ICD-10-CM

## 2022-11-10 DIAGNOSIS — Z7982 Long term (current) use of aspirin: Secondary | ICD-10-CM | POA: Diagnosis not present

## 2022-11-10 DIAGNOSIS — E7800 Pure hypercholesterolemia, unspecified: Secondary | ICD-10-CM | POA: Diagnosis present

## 2022-11-10 DIAGNOSIS — I350 Nonrheumatic aortic (valve) stenosis: Secondary | ICD-10-CM | POA: Diagnosis not present

## 2022-11-10 DIAGNOSIS — E875 Hyperkalemia: Secondary | ICD-10-CM | POA: Diagnosis present

## 2022-11-10 DIAGNOSIS — D120 Benign neoplasm of cecum: Secondary | ICD-10-CM | POA: Diagnosis present

## 2022-11-10 DIAGNOSIS — I130 Hypertensive heart and chronic kidney disease with heart failure and stage 1 through stage 4 chronic kidney disease, or unspecified chronic kidney disease: Secondary | ICD-10-CM | POA: Diagnosis not present

## 2022-11-10 DIAGNOSIS — D62 Acute posthemorrhagic anemia: Secondary | ICD-10-CM | POA: Diagnosis not present

## 2022-11-10 DIAGNOSIS — K5521 Angiodysplasia of colon with hemorrhage: Secondary | ICD-10-CM | POA: Diagnosis not present

## 2022-11-10 DIAGNOSIS — R109 Unspecified abdominal pain: Secondary | ICD-10-CM | POA: Diagnosis not present

## 2022-11-10 DIAGNOSIS — T465X5A Adverse effect of other antihypertensive drugs, initial encounter: Secondary | ICD-10-CM | POA: Diagnosis present

## 2022-11-10 DIAGNOSIS — R0602 Shortness of breath: Secondary | ICD-10-CM | POA: Diagnosis not present

## 2022-11-10 DIAGNOSIS — I69351 Hemiplegia and hemiparesis following cerebral infarction affecting right dominant side: Secondary | ICD-10-CM

## 2022-11-10 DIAGNOSIS — I10 Essential (primary) hypertension: Secondary | ICD-10-CM | POA: Diagnosis not present

## 2022-11-10 DIAGNOSIS — Z79899 Other long term (current) drug therapy: Secondary | ICD-10-CM | POA: Diagnosis not present

## 2022-11-10 DIAGNOSIS — I2489 Other forms of acute ischemic heart disease: Secondary | ICD-10-CM | POA: Diagnosis present

## 2022-11-10 DIAGNOSIS — R000 Tachycardia, unspecified: Secondary | ICD-10-CM | POA: Diagnosis not present

## 2022-11-10 DIAGNOSIS — D649 Anemia, unspecified: Secondary | ICD-10-CM | POA: Diagnosis not present

## 2022-11-10 DIAGNOSIS — I959 Hypotension, unspecified: Secondary | ICD-10-CM | POA: Diagnosis not present

## 2022-11-10 DIAGNOSIS — I5030 Unspecified diastolic (congestive) heart failure: Secondary | ICD-10-CM | POA: Diagnosis not present

## 2022-11-10 DIAGNOSIS — N1832 Chronic kidney disease, stage 3b: Secondary | ICD-10-CM | POA: Diagnosis present

## 2022-11-10 DIAGNOSIS — I351 Nonrheumatic aortic (valve) insufficiency: Secondary | ICD-10-CM | POA: Diagnosis not present

## 2022-11-10 DIAGNOSIS — R739 Hyperglycemia, unspecified: Secondary | ICD-10-CM | POA: Diagnosis not present

## 2022-11-10 DIAGNOSIS — Z87891 Personal history of nicotine dependence: Secondary | ICD-10-CM

## 2022-11-10 DIAGNOSIS — R112 Nausea with vomiting, unspecified: Secondary | ICD-10-CM | POA: Diagnosis not present

## 2022-11-10 DIAGNOSIS — R0902 Hypoxemia: Secondary | ICD-10-CM | POA: Diagnosis not present

## 2022-11-10 DIAGNOSIS — Z880 Allergy status to penicillin: Secondary | ICD-10-CM | POA: Diagnosis not present

## 2022-11-10 DIAGNOSIS — K921 Melena: Secondary | ICD-10-CM | POA: Diagnosis not present

## 2022-11-10 DIAGNOSIS — K625 Hemorrhage of anus and rectum: Secondary | ICD-10-CM | POA: Diagnosis not present

## 2022-11-10 DIAGNOSIS — R7989 Other specified abnormal findings of blood chemistry: Secondary | ICD-10-CM | POA: Diagnosis not present

## 2022-11-10 LAB — PROTIME-INR
INR: 1.2 (ref 0.8–1.2)
Prothrombin Time: 15.2 seconds (ref 11.4–15.2)

## 2022-11-10 LAB — COMPREHENSIVE METABOLIC PANEL
ALT: 26 U/L (ref 0–44)
AST: 28 U/L (ref 15–41)
Albumin: 3.1 g/dL — ABNORMAL LOW (ref 3.5–5.0)
Alkaline Phosphatase: 46 U/L (ref 38–126)
Anion gap: 14 (ref 5–15)
BUN: 68 mg/dL — ABNORMAL HIGH (ref 8–23)
CO2: 18 mmol/L — ABNORMAL LOW (ref 22–32)
Calcium: 9.2 mg/dL (ref 8.9–10.3)
Chloride: 109 mmol/L (ref 98–111)
Creatinine, Ser: 2.39 mg/dL — ABNORMAL HIGH (ref 0.61–1.24)
GFR, Estimated: 27 mL/min — ABNORMAL LOW (ref >60)
Glucose, Bld: 187 mg/dL — ABNORMAL HIGH (ref 70–99)
Potassium: 5.5 mmol/L — ABNORMAL HIGH (ref 3.5–5.1)
Sodium: 141 mmol/L (ref 135–145)
Total Bilirubin: 0.2 mg/dL — ABNORMAL LOW (ref 0.3–1.2)
Total Protein: 6.3 g/dL — ABNORMAL LOW (ref 6.5–8.1)

## 2022-11-10 LAB — CBC WITH DIFFERENTIAL/PLATELET
Abs Immature Granulocytes: 0.03 10*3/uL (ref 0.00–0.07)
Basophils Absolute: 0 10*3/uL (ref 0.0–0.1)
Basophils Relative: 0 %
Eosinophils Absolute: 0.2 10*3/uL (ref 0.0–0.5)
Eosinophils Relative: 3 %
HCT: 18.4 % — ABNORMAL LOW (ref 39.0–52.0)
Hemoglobin: 5.6 g/dL — CL (ref 13.0–17.0)
Immature Granulocytes: 0 %
Lymphocytes Relative: 12 %
Lymphs Abs: 1 10*3/uL (ref 0.7–4.0)
MCH: 29.2 pg (ref 26.0–34.0)
MCHC: 30.4 g/dL (ref 30.0–36.0)
MCV: 95.8 fL (ref 80.0–100.0)
Monocytes Absolute: 0.8 10*3/uL (ref 0.1–1.0)
Monocytes Relative: 10 %
Neutro Abs: 6.4 10*3/uL (ref 1.7–7.7)
Neutrophils Relative %: 75 %
Platelets: 236 10*3/uL (ref 150–400)
RBC: 1.92 MIL/uL — ABNORMAL LOW (ref 4.22–5.81)
RDW: 16.2 % — ABNORMAL HIGH (ref 11.5–15.5)
WBC: 8.5 10*3/uL (ref 4.0–10.5)
nRBC: 0 % (ref 0.0–0.2)

## 2022-11-10 LAB — TROPONIN I (HIGH SENSITIVITY)
Troponin I (High Sensitivity): 108 ng/L (ref <18)
Troponin I (High Sensitivity): 107 ng/L (ref <18)

## 2022-11-10 LAB — PREPARE RBC (CROSSMATCH)

## 2022-11-10 MED ORDER — SODIUM CHLORIDE 0.9% IV SOLUTION: Freq: Once | INTRAVENOUS | Status: AC

## 2022-11-10 MED ORDER — SODIUM CHLORIDE 0.9 % IV SOLN: INTRAVENOUS | Status: AC

## 2022-11-10 NOTE — ED Notes (Signed)
Goddaughter Elmarie Shiley [phone] would like an update asap

## 2022-11-10 NOTE — ED Provider Notes (Signed)
[place] EMERGENCY DEPARTMENT AT [facility] Provider Note   CSN: [id number] Arrival date & time: 11/10/22  2040     History  Chief Complaint  Patient presents with   Rectal Bleeding    David CHRISCO is a 82 y.o. male with a past medical history significant for CKD stage III, diastolic heart failure, hypertension, aortic valve stenosis, hyperlipidemia, PAD, chronic low back pain, and previous CVA with mild residual right-sided weakness who presents to the ED due to rectal bleeding.  Daughter at bedside notes that patient told her he has had "red stool" for the past week.  Daughter noticed blood in stool for the first time today.  While patient was in the bathroom, God daughter found patient where his upper extremities were shaking and he was "staring off".  Goddaughter notes that patient did not lose consciousness.  No urinary incontinence.  No history of seizures.  Goddaughter believes patient was slightly confused following the episode.  Goddaughter then notes that patient had numerous episodes of nonbloody, nonbilious emesis. Patient is a poor historian and denies any rectal bleeding.  Patient's only complaint is right lower extremity burning sensation.  Patient admits to chronic low back pain.  Patient states pain feels better with movement of right lower extremity.  Patient has a history of PAD.  History obtained from patient and past medical records. No interpreter used during encounter.       Home Medications Prior to Admission medications   Medication Sig Start Date End Date Taking? Authorizing Provider  amLODipine (NORVASC) 5 MG tablet TAKE 1 TABLET EVERY DAY 10/23/22   Yates Decamp, MD  aspirin 81 MG tablet Take 81 mg by mouth daily.    [provider]  Calcium Carbonate-Vit D-Min (CALTRATE 600+D PLUS PO) Take by mouth. 1 tablet 2 times a day    [provider]  camphor-menthol (SARNA) lotion Apply 1 application topically as needed for itching. 01/01/21    Frederica Kuster, MD  COVID-19 mRNA vaccine [phone] (COMIRNATY) syringe Inject into the muscle. 07/24/22   Judyann Munson, MD  dapagliflozin propanediol (FARXIGA) 10 MG TABS tablet Take 1 tablet (10 mg total) by mouth daily before breakfast. 01/27/22   Frederica Kuster, MD  ezetimibe (ZETIA) 10 MG tablet TAKE 1 TABLET EVERY DAY 02/09/22   Frederica Kuster, MD  furosemide (LASIX) 20 MG tablet Take 40 mg by mouth every Monday, Wednesday, and Friday. Prescribed By Dr.Foster, Lawson Fiscal ([facility]) 08/06/22   [provider]  latanoprost (XALATAN) 0.005 % ophthalmic solution Place 1 drop into both eyes at bedtime.  12/18/15   [provider]  losartan (COZAAR) 100 MG tablet TAKE 1 TABLET EVERY DAY 08/04/22   Frederica Kuster, MD  Multiple Vitamins-Minerals (HEALTHY EYES/LUTEIN) TABS Take 1 tablet by mouth daily.    [provider]  rosuvastatin (CRESTOR) 40 MG tablet TAKE 1 TABLET EVERY DAY (DISCONTINUE PRAVASTATIN) 05/21/22   Yates Decamp, MD  timolol (BETIMOL) 0.5 % ophthalmic solution Place 1 drop into both eyes every morning.     [provider]  triamcinolone 0.1% oint-Cerave equivalent lotion 1:1 mixture Apply topically 2 (two) times daily. 05/23/21   Frederica Kuster, MD      Allergies    Penicillins    Review of Systems   Review of Systems  Gastrointestinal:  Positive for blood in stool, nausea and vomiting.  Musculoskeletal:  Positive for arthralgias.    Physical Exam Updated Vital Signs BP (!) 154/48 (  BP Location: Right Arm)   Pulse (!) 101   Temp (!) 97.4 F (36.3 C) (Rectal)   Ht  (1.753 m)   Wt 71.7 kg   SpO2 100%   BMI 23.33 kg/m  Physical Exam Vitals and nursing note reviewed.  Constitutional:      General: He is not in acute distress.    Appearance: Normal appearance. He is not ill-appearing.  HENT:     Head: Normocephalic.  Eyes:     Pupils: Pupils are equal, round, and reactive to light.  Neck:     Thyroid:  No thyromegaly.     Vascular: No carotid bruit.  Cardiovascular:     Rate and Rhythm: Regular rhythm. Tachycardia present.     Pulses: Normal pulses.     Heart sounds: Normal heart sounds, S1 normal and S2 normal. No murmur heard.    No friction rub. No gallop.  Pulmonary:     Effort: Pulmonary effort is normal. No respiratory distress.     Breath sounds: Normal breath sounds. No wheezing, rhonchi or rales.  Abdominal:     General: Abdomen is flat. There is no distension or abdominal bruit.     Palpations: Abdomen is soft. There is no pulsatile mass.     Tenderness: There is no abdominal tenderness. There is no guarding or rebound.  Genitourinary:    Comments: Rectal exam performed with chaperone in room.  Gross blood on exam. Musculoskeletal:        General: Normal range of motion.     Cervical back: Neck supple.     Right lower leg: No edema.     Left lower leg: No edema.  Skin:    General: Skin is warm and dry.  Neurological:     General: No focal deficit present.     Mental Status: He is alert.  Psychiatric:        Mood and Affect: Mood normal.        Behavior: Behavior normal.     ED Results / Procedures / Treatments   Labs (all labs ordered are listed, but only abnormal results are displayed) Labs Reviewed  CBC WITH DIFFERENTIAL/PLATELET - Abnormal; Notable for the following components:      Result Value   RBC 1.92 (*)    Hemoglobin 5.6 (*)    HCT 18.4 (*)    RDW 16.2 (*)    All other components within normal limits  COMPREHENSIVE METABOLIC PANEL - Abnormal; Notable for the following components:   Potassium 5.5 (*)    CO2 18 (*)    Glucose, Bld 187 (*)    BUN 68 (*)    Creatinine, Ser 2.39 (*)    Total Protein 6.3 (*)    Albumin 3.1 (*)    Total Bilirubin 0.2 (*)    GFR, Estimated 27 (*)    All other components within normal limits  TROPONIN I (HIGH SENSITIVITY) - Abnormal; Notable for the following components:   Troponin I (High Sensitivity) 108 (*)     All other components within normal limits  TROPONIN I (HIGH SENSITIVITY) - Abnormal; Notable for the following components:   Troponin I (High Sensitivity) 107 (*)    All other components within normal limits  RESP PANEL BY RT-PCR (RSV, FLU A&B, COVID)  RVPGX2  PROTIME-INR  POC OCCULT BLOOD, ED  TYPE AND SCREEN  PREPARE RBC (CROSSMATCH)    EKG EKG Interpretation  Date/Time:  Tuesday November 10 2022 20:47:46 EDT Ventricular Rate:  95 PR Interval:    QRS Duration: 143 QT Interval:  384 QTC Calculation: 483 R Axis:   -19 Text Interpretation: indeterminate rhythm Right bundle branch block Abnrm T, consider ischemia, anterolateral lds Artifact in lead(s) I II III aVR aVL aVF V1 V2 Confirmed by Benjiman Core [phone]) on 11/10/2022 9:43:29 PM  Radiology DG Chest Portable 1 View  Result Date: 11/10/2022 CLINICAL DATA:  Shortness of breath EXAM: PORTABLE CHEST 1 VIEW COMPARISON:  11/29/2019 FINDINGS: No acute airspace disease. Stable cardiomediastinal silhouette with aortic atherosclerosis. No pneumothorax. IMPRESSION: No active disease. Electronically Signed   By: Jasmine Pang M.D.   On: 11/10/2022 22:14    Procedures .Critical Care  Performed by: Mannie Stabile, PA-C Authorized by: Mannie Stabile, PA-C   Critical care provider statement:    Critical care time (minutes):  38   Critical care was necessary to treat or prevent imminent or life-threatening deterioration of the following conditions:  Metabolic crisis and circulatory failure   Critical care was time spent personally by me on the following activities:  Development of treatment plan with patient or surrogate, discussions with consultants, evaluation of patient's response to treatment, examination of patient, ordering and review of laboratory studies, ordering and review of radiographic studies, ordering and performing treatments and interventions, pulse oximetry, re-evaluation of patient's condition and review of old  charts   I assumed direction of critical care for this patient from another provider in my specialty: no     Care discussed with: admitting provider       Medications Ordered in ED Medications  0.9 %  sodium chloride infusion (Manually program via Guardrails IV Fluids) (has no administration in time range)    ED Course/ Medical Decision Making/ A&P Clinical Course as of 11/10/22 2353  Tue Nov 10, 2022  2320 Discussed risk and benefits of blood transfusion with patient at bedside.  Patient agreeable to have blood transfusion.  2 units ordered. [CA]    Clinical Course User Index [CA] Mannie Stabile, PA-C                             Medical Decision Making Amount and/or Complexity of Data Reviewed Independent Historian: caregiver External Data Reviewed: notes. Labs: ordered. Decision-making details documented in ED Course. Radiology: ordered and independent interpretation performed. Decision-making details documented in ED Course. ECG/medicine tests: ordered and independent interpretation performed. Decision-making details documented in ED Course.  Risk Prescription drug management. Decision regarding hospitalization.   This patient presents to the ED for concern of rectal bleeding, RLE pain, this involves an extensive number of treatment options, and is a complaint that carries with it a high risk of complications and morbidity.  The differential diagnosis includes Gi bleed, hemorrhoids, diverticulitis, or radiculopathy, PAD, arterial occlusion  82 year old male presents to the ED due to hematochezia x 1 week.  Goddaughter at bedside notes that patient had an episode while in the toilet with upper extremity movements associated with "staring off".  No loss of consciousness.  No history of seizures.  Patient is a poor historian and is not forthcoming with information during initial evaluation.  Patient only admits to right lower extremity pain which he describes as a burning  sensation with improvement with movement of leg.  Admits to chronic low back pain.  Patient has a history of PAD.  Goddaughter also notes numerous episodes of nonbloody, nonbilious emesis.  Rectal exam performed with chaperone in room  which was positive for gross blood. Type and screen ordered. Labs to check hemoglobin. Unknown etiology of shaking while on toilet? Seizure? Near syncope? Cardiac event? Patient denies chest pain. Suspect symptoms related to GI bleed/ symptomatic anemia given rectal exam. Unable to palpate pulses; however per previous cardiology note, pulses nonpalpable. Bilateral feet cold. No color changes. Lower suspicion for arterial occlusion. Pain either related to PAD vs. Lumbar radiculopathy from chronic low back pain. No weakness. No infectious symptoms to suggest infectious etiology. Will hold off on imaging at this time. Discussed with Dr. Rubin PayorPickering who evaluated patient at bedside and agrees with assessment and plan.  CBC significant for hemoglobin at 5.6.  Patient agreeable to blood transfusion as noted above.  CMP significant for hyperkalemia 5.5.  AKI creatinine 2.39 and BUN at 68.  Troponin elevated at 108.  Patient denies chest pain.  EKG nonischemic.  Possibly related to significant anemia? Informed Dr. Orvan FalconerBeavers with GI about patient via secure chat. GI team will see patient tomorrow AM.   11:26 PM reassessed patient at bedside.  Patient's BP 107/40 and tachycardic at 105.  Patient resting comfortably in bed.  Denies any complaints.  11:48 PM Discussed with Dr. Roda ShuttersXu with [facility] who agrees to admit. CT abdomen without contrast ordered after discussion with patient.         Final Clinical Impression(s) / ED Diagnoses Final diagnoses:  Hematochezia  Nausea and vomiting, unspecified vomiting type    Rx / DC Orders ED Discharge Orders     None         Jesusita Okaberman, Gustava Berland C, PA-C 11/10/22 2353    Benjiman CorePickering, Nathan, MD 11/11/22 2330

## 2022-11-10 NOTE — Telephone Encounter (Signed)
Daughter wants to know if he can resume Cilostazol due to pain in right leg burning and ankle pain on right side

## 2022-11-10 NOTE — Telephone Encounter (Signed)
Sure

## 2022-11-10 NOTE — ED Triage Notes (Signed)
Pt BIB [facility] from home. CC: blood in stool, right leg pain. Pt had 2 vomiting episodes, mumbling noticed by roommate.  Hx: stroke 2008, HTN

## 2022-11-10 NOTE — ED Notes (Signed)
[phone] tiffany pt goddaughter left and wanted to leave her number in case of any questions or concerns and possible updates.

## 2022-11-10 NOTE — H&P (Incomplete)
History and Physical    [name] NFA:[id number] DOB: 1941-07-12 DOA: 11/10/2022  PCP: Frederica Kuster, MD Patient coming from: home  I have personally briefly reviewed patient's old medical records in [facility]  Chief Complaint: blood in stool  HPI: [name] is a 82 y.o. male with medical history significant of BPH, HTN, HLD, NIDDM2, HPpEF, CKDIIIb presents to the ED due to above complaints  ED Course:  Data reviewed: ,Blood pressure (!) 154/48, pulse (!) 101, temperature (!) 97.4 F (36.3 C), temperature source Rectal, height  (1.753 m), weight 71.7 kg, SpO2 100 %.  Cxr: No acute airspace disease. Stable cardiomediastinal silhouette with aortic atherosclerosis. No pneumothorax.  Labs:  Hgb 5.6 K 5.5, blood glucose 187, BUN 68, creatinine 2.39  Lft low albumin at 3.1, low total protein of 6.3 otherwise unremarkable   Rectal exam by EDP showed gross blood, EDP ordered 2 units of blood, called [facility] will see patient the morning  Review of Systems: As per HPI otherwise all other systems reviewed and are negative.   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)    Pure hypercholesterolemia    Unspecified vitamin D deficiency     Past Surgical History:  Procedure Laterality Date   CATARACT EXTRACTION     EYE SURGERY     left eye,    cornea repair   PERIPHERAL VASCULAR CATHETERIZATION N/A 07/28/2016   Procedure: Lower Extremity Angiography;  Surgeon: Yates Decamp, MD;   Location: [facility] INVASIVE CV LAB;  Service: Cardiovascular;  Laterality: N/A;    Social History  reports that he quit smoking about 51 years ago. His smoking use included cigarettes. He has a 6.50 pack-year smoking history. He has never used smokeless tobacco. He reports that he does not drink alcohol and does not use drugs.  Allergies  Allergen Reactions   Penicillins Other (See Comments)    Unknown  Has patient had a PCN reaction causing immediate rash, facial/tongue/throat swelling, SOB or lightheadedness with hypotension: {unknown Has patient had a PCN reaction causing severe rash involving mucus membranes or skin necrosis: unknown Has patient had a PCN reaction that required hospitalization {unknown Has patient had a PCN reaction occurring within the last 10 years: no If all of the above answers are "NO", then may proceed with Cephalosporin use.    Family History  Problem Relation Age of Onset   Cancer Father     Prior to Admission medications   Medication Sig Start Date End Date Taking? Authorizing Provider  amLODipine (NORVASC) 5 MG tablet TAKE 1 TABLET EVERY DAY 10/23/22   Yates Decamp, MD  aspirin 81 MG tablet Take 81 mg by mouth daily.    [provider]  Calcium Carbonate-Vit D-Min (CALTRATE 600+D PLUS PO) Take by mouth. 1 tablet 2 times a day    [provider]  camphor-menthol (SARNA) lotion Apply 1 application topically as needed for itching. 01/01/21   Frederica Kuster, MD  COVID-19 mRNA vaccine 2023-2024 (COMIRNATY) syringe Inject into the muscle. 07/24/22   Judyann MunsonSnider, Cynthia, MD  dapagliflozin propanediol (FARXIGA) 10 MG TABS tablet Take 1 tablet (10 mg total) by mouth daily before breakfast. 01/27/22   Frederica KusterMiller, Stephen M, MD  ezetimibe (ZETIA) 10 MG tablet TAKE 1 TABLET EVERY DAY 02/09/22   Frederica KusterMiller, Stephen M, MD  furosemide (LASIX) 20 MG tablet Take 40 mg by mouth every Monday, Wednesday, and Friday. Prescribed By Dr.Foster, Lawson FiscalLori ([facility])  08/06/22   [provider]  latanoprost (XALATAN) 0.005 % ophthalmic solution Place 1 drop into both eyes at bedtime.  12/18/15   [provider]  losartan (COZAAR) 100 MG tablet TAKE 1 TABLET EVERY DAY 08/04/22   Frederica KusterMiller, Stephen M, MD  Multiple Vitamins-Minerals (HEALTHY EYES/LUTEIN) TABS Take 1 tablet by mouth daily.    [provider]  rosuvastatin (CRESTOR) 40 MG tablet TAKE 1 TABLET EVERY DAY (DISCONTINUE PRAVASTATIN) 05/21/22   Yates DecampGanji, Jay, MD  timolol (BETIMOL) 0.5 % ophthalmic solution Place 1 drop into both eyes every morning.     [provider]  triamcinolone 0.1% oint-Cerave equivalent lotion 1:1 mixture Apply topically 2 (two) times daily. 05/23/21   Frederica KusterMiller, Stephen M, MD    Physical Exam: Vitals:   11/11/22 0117 11/11/22 0130 11/11/22 0219 11/11/22 0246  BP: 132/66 (!) 139/107 (!) 134/48 (!) 133/58  Pulse: 99 98 87 87  Resp: 18 18 16 18   Temp: 98.8 F (37.1 C) 97.7 F (36.5 C) 97.7 F (36.5 C) 98 F (36.7 C)  TempSrc: Oral Oral Oral Oral  SpO2:  100% 100% 100%  Weight:      Height:        Constitutional: NAD, calm, comfortable Eyes: PERRL, lids and conjunctivae normal ENMT: Mucous membranes are moist.  Respiratory: clear to auscultation bilaterally, no wheezing, no crackles. Normal respiratory effort. No accessory muscle use.  Cardiovascular: Regular rate and rhythm,  + murmur.  Abdomen: no tenderness, not distended, Bowel sounds positive.  Musculoskeletal: no clubbing / cyanosis. No joint deformity upper and lower extremities. Good ROM, no contractures. Normal muscle tone.  Skin: no rashes, lesions, ulcers. No induration Neurologic: CN 2-12 grossly intact. Sensation intact, Strength 5/5 in all 4.  Psychiatric: Normal judgment and insight. Alert and oriented x 3. Normal mood.    Labs on Admission: I have personally reviewed following labs and imaging studies  CBC: Recent Labs  Lab 11/10/22 2138  WBC 8.5  NEUTROABS 6.4  HGB 5.6*   HCT 18.4*  MCV 95.8  PLT 236    Basic Metabolic Panel: Recent Labs  Lab 11/10/22 2138  NA 141  K 5.5*  CL 109  CO2 18*  GLUCOSE 187*  BUN 68*  CREATININE 2.39*  CALCIUM 9.2    GFR: Estimated Creatinine Clearance: 24.2 mL/min (A) (by C-G formula based on SCr of 2.39 mg/dL (H)).  Liver Function Tests: Recent Labs  Lab 11/10/22 2138  AST 28  ALT 26  ALKPHOS 46  BILITOT 0.2*  PROT 6.3*  ALBUMIN 3.1*    Urine analysis:    Component Value Date/Time   COLORURINE YELLOW 11/28/2019 2323   APPEARANCEUR CLEAR 11/28/2019 2323   LABSPEC 1.014 11/28/2019 2323   PHURINE 5.0 11/28/2019 2323   GLUCOSEU NEGATIVE 11/28/2019 2323   HGBUR NEGATIVE 11/28/2019 2323   BILIRUBINUR NEGATIVE 11/28/2019 2323   KETONESUR NEGATIVE 11/28/2019 2323   PROTEINUR NEGATIVE 11/28/2019 2323   UROBILINOGEN 1.0 01/02/2007 0322   NITRITE NEGATIVE 11/28/2019 2323  LEUKOCYTESUR TRACE (A) 11/28/2019 2323    Radiological Exams on Admission: DG Chest Portable 1 View  Result Date: 11/10/2022 CLINICAL DATA:  Shortness of breath EXAM: PORTABLE CHEST 1 VIEW COMPARISON:  11/29/2019 FINDINGS: No acute airspace disease. Stable cardiomediastinal silhouette with aortic atherosclerosis. No pneumothorax. IMPRESSION: No active disease. Electronically Signed   By: Jasmine Pang M.D.   On: 11/10/2022 22:14    EKG: Independently reviewed.   Assessment/Plan Principal Problem:   GI bleed Active Problems:   Aortic valve stenosis   Acute blood loss anemia   AKI (acute kidney injury)    Blood loss anemia/hematochezia -Admit to progressive bed, monitor hemoglobin, give blood transfusion, hold aspirin -N.p.o. -GI will see in the morning  N/V Reports vomited x2 prior to coming to the ED, currently denies n/v Ct  ab/pel noncontrast   Brief Confusion? Per family, patient denies confusion, he is oriented x3 currently  AKI on CKD 3B Likely prerenal in the setting of GI bleed  check ua Hold losartan and  Lasix Renal dosing meds  Troponin elevation No chest pain, no acute ST-T changes Likely demand site ischemia Obtain echocardiogram   mild hypokalemia Hold losartan, gentle hydration, repeat bmp in am  HTN Hold home BP meds in the setting of GI bleed, as needed hydralazine  Chronic HFpEF Hold lasix, he does not appear volume overloaded ,strict intake and output  Aortic stenosis + murmur, denies chest pain, denies syncope Update echo  DVT prophylaxis: scd's   Code Status:   Full Family Communication:  patient  Patient is from: home   Anticipated DC to: home   Anticipated DC date:  >24hrs   Consults called:  [facility] Admission status:  Inpatient  Severity of Illness: The appropriate patient status for this patient is INPATIENT due to history and comorbidities, severity of illness, required intensity of service to ensure the patient's safety and to avoid risk of adverse events/further clinical deterioration.  Severity of illness/comorbidities: Hematochezia/severe anemia Intensity of service: tests, high frequency of surveillance, interventions It is not anticipated that the patient will be medically stable for discharge from the hospital within 2 midnights of admission.    Voice Recognition Reubin Milan dictation system was used to create this note, attempts have been made to correct errors. Please contact the author with questions and/or clarifications.  Albertine Grates MD PhD FACP [facility]  How to contact the [facility] Attending or Consulting provider 7A - 7P or covering provider during after hours 7P -7A, for this patient?   Check the care team in [facility] and look for a) attending/consulting [facility] provider listed and b) the [facility] team listed Log into [email or web address] and use [place]'s universal password to access. If you do not have the password, please contact the hospital operator. Locate the [facility], The provider you are looking for under [facility] and page to a number that you can be  directly reached. If you still have difficulty reaching the provider, please page the [facility] (Director on Call) for the Hospitalists listed on amion for assistance.  11/11/2022, 3:24 AM

## 2022-11-11 ENCOUNTER — Inpatient Hospital Stay (HOSPITAL_COMMUNITY): Payer: Medicare HMO

## 2022-11-11 ENCOUNTER — Other Ambulatory Visit: Payer: Self-pay

## 2022-11-11 DIAGNOSIS — R7989 Other specified abnormal findings of blood chemistry: Secondary | ICD-10-CM

## 2022-11-11 DIAGNOSIS — N179 Acute kidney failure, unspecified: Secondary | ICD-10-CM | POA: Insufficient documentation

## 2022-11-11 DIAGNOSIS — D62 Acute posthemorrhagic anemia: Secondary | ICD-10-CM | POA: Insufficient documentation

## 2022-11-11 DIAGNOSIS — N183 Chronic kidney disease, stage 3 unspecified: Secondary | ICD-10-CM | POA: Diagnosis not present

## 2022-11-11 DIAGNOSIS — D649 Anemia, unspecified: Secondary | ICD-10-CM

## 2022-11-11 DIAGNOSIS — I5030 Unspecified diastolic (congestive) heart failure: Secondary | ICD-10-CM

## 2022-11-11 DIAGNOSIS — K921 Melena: Secondary | ICD-10-CM

## 2022-11-11 DIAGNOSIS — I350 Nonrheumatic aortic (valve) stenosis: Secondary | ICD-10-CM

## 2022-11-11 LAB — BPAM RBC
Blood Product Expiration Date: 202405072359
Blood Product Expiration Date: 202405072359
Blood Product Expiration Date: 202405072359
ISSUE DATE / TIME: 202404101735

## 2022-11-11 LAB — CBC
HCT: 16.6 % — ABNORMAL LOW (ref 39.0–52.0)
Hemoglobin: 5.4 g/dL — CL (ref 13.0–17.0)
MCH: 29.8 pg (ref 26.0–34.0)
MCHC: 32.5 g/dL (ref 30.0–36.0)
MCV: 91.7 fL (ref 80.0–100.0)
Platelets: 184 10*3/uL (ref 150–400)
RBC: 1.81 MIL/uL — ABNORMAL LOW (ref 4.22–5.81)
RDW: 15.7 % — ABNORMAL HIGH (ref 11.5–15.5)
WBC: 8 10*3/uL (ref 4.0–10.5)
nRBC: 0 % (ref 0.0–0.2)

## 2022-11-11 LAB — URINALYSIS, ROUTINE W REFLEX MICROSCOPIC
Bilirubin Urine: NEGATIVE
Glucose, UA: 100 mg/dL — AB
Hgb urine dipstick: NEGATIVE
Ketones, ur: NEGATIVE mg/dL
Leukocytes,Ua: NEGATIVE
Nitrite: NEGATIVE
Protein, ur: NEGATIVE mg/dL
Specific Gravity, Urine: 1.015 (ref 1.005–1.030)
pH: 5.5 (ref 5.0–8.0)

## 2022-11-11 LAB — BASIC METABOLIC PANEL
Anion gap: 9 (ref 5–15)
BUN: 68 mg/dL — ABNORMAL HIGH (ref 8–23)
CO2: 18 mmol/L — ABNORMAL LOW (ref 22–32)
Calcium: 8.5 mg/dL — ABNORMAL LOW (ref 8.9–10.3)
Chloride: 114 mmol/L — ABNORMAL HIGH (ref 98–111)
Creatinine, Ser: 2.15 mg/dL — ABNORMAL HIGH (ref 0.61–1.24)
GFR, Estimated: 30 mL/min — ABNORMAL LOW (ref >60)
Glucose, Bld: 128 mg/dL — ABNORMAL HIGH (ref 70–99)
Potassium: 5.1 mmol/L (ref 3.5–5.1)
Sodium: 141 mmol/L (ref 135–145)

## 2022-11-11 LAB — PREPARE RBC (CROSSMATCH)

## 2022-11-11 LAB — RESP PANEL BY RT-PCR (RSV, FLU A&B, COVID)  RVPGX2
Influenza A by PCR: NEGATIVE
Influenza B by PCR: NEGATIVE
Resp Syncytial Virus by PCR: NEGATIVE
SARS Coronavirus 2 by RT PCR: NEGATIVE

## 2022-11-11 LAB — TYPE AND SCREEN

## 2022-11-11 LAB — POC OCCULT BLOOD, ED: Fecal Occult Bld: POSITIVE — AB

## 2022-11-11 LAB — GLUCOSE, CAPILLARY
Glucose-Capillary: 127 mg/dL — ABNORMAL HIGH (ref 70–99)
Glucose-Capillary: 126 mg/dL — ABNORMAL HIGH (ref 70–99)

## 2022-11-11 MED ORDER — SODIUM CHLORIDE 0.9% IV SOLUTION: Freq: Once | INTRAVENOUS | Status: AC

## 2022-11-11 MED ORDER — LATANOPROST 0.005 % OP SOLN
1.0000 [drp] | Freq: Every day | OPHTHALMIC | Status: DC
  Administered 2022-11-11 – 2022-11-13 (×3): 1 [drp] via OPHTHALMIC
  Filled 2022-11-11: qty 2.5

## 2022-11-11 MED ORDER — PEG-KCL-NACL-NASULF-NA ASC-C 100 G PO SOLR
0.5000 | Freq: Once | ORAL | Status: AC
  Administered 2022-11-11: 100 g via ORAL

## 2022-11-11 MED ORDER — ROSUVASTATIN CALCIUM 20 MG PO TABS
40.0000 mg | ORAL_TABLET | Freq: Every day | ORAL | Status: DC
  Administered 2022-11-11 – 2022-11-14 (×4): 40 mg via ORAL
  Filled 2022-11-11 (×4): qty 2

## 2022-11-11 MED ORDER — PEG-KCL-NACL-NASULF-NA ASC-C 100 G PO SOLR
0.5000 | Freq: Once | ORAL | Status: AC
  Administered 2022-11-11: 100 g via ORAL
  Filled 2022-11-11: qty 1

## 2022-11-11 MED ORDER — TIMOLOL MALEATE 0.5 % OP SOLN
1.0000 [drp] | Freq: Every day | OPHTHALMIC | Status: DC
  Administered 2022-11-11 – 2022-11-14 (×4): 1 [drp] via OPHTHALMIC
  Filled 2022-11-11: qty 5

## 2022-11-11 MED ORDER — EZETIMIBE 10 MG PO TABS: 10.0000 mg | ORAL_TABLET | Freq: Every day | ORAL | Status: DC

## 2022-11-11 MED ORDER — PEG-KCL-NACL-NASULF-NA ASC-C 100 G PO SOLR: 1.0000 | Freq: Once | ORAL | Status: DC

## 2022-11-11 MED ORDER — INSULIN ASPART 100 UNIT/ML IJ SOLN
0.0000 [IU] | Freq: Three times a day (TID) | INTRAMUSCULAR | Status: DC
  Administered 2022-11-12 – 2022-11-13 (×2): 1 [IU] via SUBCUTANEOUS

## 2022-11-11 MED ORDER — TIMOLOL HEMIHYDRATE 0.5 % OP SOLN: 1.0000 [drp] | OPHTHALMIC | Status: DC

## 2022-11-11 MED ORDER — INSULIN ASPART 100 UNIT/ML IJ SOLN: 0.0000 [IU] | Freq: Every day | INTRAMUSCULAR | Status: DC

## 2022-11-11 MED ORDER — ONDANSETRON HCL 4 MG/2ML IJ SOLN
4.0000 mg | Freq: Once | INTRAMUSCULAR | Status: AC
  Administered 2022-11-11: 4 mg via INTRAVENOUS
  Filled 2022-11-11: qty 2

## 2022-11-11 NOTE — H&P (View-Only) (Signed)
Consultation Note   Referring Provider:  [facility] PCP: Frederica Kuster, MD Primary Gastroenterologist: Gentry Fitz        Reason for consultation: hematochezia  Hospital Day: 2   Assessment   82 year old male with the following:  *Hemodynamically stable painless hematochezia ( dark red blood on exam).  Suspect diverticular hemorrhage  *Severe anemia ( acute on chronic). Hgb 5, down from 11.5 in April 2022 S/p 1 u PRBC. Follow up CBC pending  *Elevated high sensitivity troponin.  No chest pain . Secondary to demand ischemia?  *AKI on CKD 3 Cr 2.39 ( baseline~ 1.8)  *Diastolic heart failure Echocardiogram results are pending  *Aortic valve stenosis   Plan   -Awaiting follow up CBC results -Clear liquids okay -CT angio not ideal given AKI / CKD3. Hopefully bleeding will stop.  -Will need colonoscopy at some point.  Could possibly be done tomorrow if he is medically stable. The risks and benefits of colonoscopy with possible polypectomy / biopsies were discussed and the patient agrees to proceed.  -Unlikely an upper GI bleed but we did discuss that sometimes bleeding can be from an upper source necessitating upper endoscopy. He adamantly refuses EGD should it become necessary at some point.    History of Present Illness   Patient is a 82 y.o. year old male with a past medical history of  CKD3  diastolic heart failure, HTN, AVS, PAD, CVA, diverticulosis, enlarged prostate see [facility] for any additional medical problems.  Patient brought to ED with blood in stool. Family found him confused and shaking.  He describes black stool for about 3 days. No abdominal pain. He had episode of emesis containing food yesterday. No blood in emesis. He takes a daily baby asa. No other NSAID use. Not anticoagulated. Appetite has been good, no recent weight loss. He has never had a colonoscopy. No FMH of colon cancer.   ED workup:  WBC  8.5, hemoglobin 5.6, MCV 95 INR 1.2 Potassium 5.5, BUN 68, creatinine 2.39 High-sensitivity troponin 108 >> 107    CT scan without contrast Diffuse colonic diverticulosis.  No active diverticulitis.No evidence of bowel obstruction. Aortoiliac atherosclerosis. Prostate enlargement.   Previous GI History / Evaluation :  none   Recent Labs    11/10/22 2138  WBC 8.5  HGB 5.6*  HCT 18.4*  PLT 236   Recent Labs    11/10/22 2138  NA 141  K 5.5*  CL 109  CO2 18*  GLUCOSE 187*  BUN 68*  CREATININE 2.39*  CALCIUM 9.2   Recent Labs    11/10/22 2138  PROT 6.3*  ALBUMIN 3.1*  AST 28  ALT 26  ALKPHOS 46  BILITOT 0.2*   No results for input(s): "HEPBSAG", "HCVAB", "HEPAIGM", "HEPBIGM" in the last 72 hours. 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) 12/11/2018   Hyperlipidemia    Hypertension    Hypertensive kidney disease, benign    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)    Pure hypercholesterolemia    Unspecified vitamin D deficiency     Past Surgical History:  Procedure Laterality Date   CATARACT EXTRACTION     EYE SURGERY     left eye,    cornea repair   PERIPHERAL VASCULAR CATHETERIZATION N/A 07/28/2016   Procedure: Lower Extremity Angiography;  Surgeon: Yates DecampJay Ganji, MD;  [facility];  Service: Cardiovascular;  Laterality: N/A;    Family History  Problem Relation Age of Onset   Cancer Father     Prior to Admission medications   Medication Sig Start Date End Date Taking? Authorizing Provider  amLODipine (NORVASC) 5 MG tablet  TAKE 1 TABLET EVERY DAY Patient taking differently: Take 5 mg by mouth daily. 10/23/22   Yates DecampGanji, Jay, MD  aspirin 81 MG tablet Take 81 mg by mouth daily.    [provider]  Calcium Carbonate-Vit D-Min (CALTRATE 600+D PLUS PO) Take by mouth. 1 tablet 2 times a day    [provider]  camphor-menthol (SARNA) lotion Apply 1 application topically as needed for itching. 01/01/21   Frederica KusterMiller, Stephen M, MD  COVID-19 mRNA vaccine [id number] (COMIRNATY) syringe Inject into the muscle. 07/24/22   Judyann MunsonSnider, Cynthia, MD  dapagliflozin propanediol (FARXIGA) 10 MG TABS tablet Take 1 tablet (10 mg total) by mouth daily before breakfast. 01/27/22   Frederica KusterMiller, Stephen M, MD  ezetimibe (ZETIA) 10 MG tablet TAKE 1 TABLET EVERY DAY Patient taking differently: Take 10 mg by mouth daily. 02/09/22   Frederica KusterMiller, Stephen M, MD  furosemide (LASIX) 20 MG tablet Take 40 mg by mouth every Monday, Wednesday, and Friday. Prescribed By Dr.Foster, Lawson FiscalLori ([facility]) 08/06/22   [provider]  latanoprost (XALATAN) 0.005 % ophthalmic solution Place 1 drop into both eyes at bedtime.  12/18/15   [provider]  losartan (COZAAR) 100 MG tablet TAKE 1 TABLET EVERY DAY Patient taking differently: Take 100 mg by mouth daily. 08/04/22   Frederica KusterMiller, Stephen M, MD  Multiple Vitamins-Minerals (HEALTHY EYES/LUTEIN) TABS Take 1 tablet by mouth daily.    [provider]  rosuvastatin (CRESTOR) 40 MG tablet TAKE 1 TABLET EVERY DAY (DISCONTINUE PRAVASTATIN) Patient taking differently: Take 40 mg by mouth daily. 05/21/22   Yates DecampGanji, Jay, MD  timolol (BETIMOL) 0.5 % ophthalmic solution Place 1 drop into both eyes every morning.     [provider]  triamcinolone 0.1% oint-Cerave equivalent lotion 1:1 mixture Apply topically 2 (two) times daily. 05/23/21   Frederica KusterMiller, Stephen M, MD    Current Facility-Administered Medications  Medication Dose Route Frequency Provider Last Rate Last Admin   0.9 %  sodium  chloride infusion   Intravenous Continuous Albertine GratesXu, Fang, MD 75 mL/hr at 11/11/22 0246 New Bag at 11/11/22 0246   Current Outpatient Medications  Medication Sig Dispense Refill   amLODipine (NORVASC) 5 MG tablet TAKE 1 TABLET EVERY DAY (Patient taking differently: Take 5 mg by mouth daily.) 90 tablet 3   aspirin 81 MG tablet Take 81 mg by mouth daily.     Calcium Carbonate-Vit D-Min (CALTRATE 600+D PLUS PO) Take by mouth. 1 tablet 2 times a day     camphor-menthol (SARNA) lotion Apply 1 application topically as needed for itching. 222 mL 0   COVID-19 mRNA vaccine 2023-2024 (COMIRNATY) syringe Inject  into the muscle. 0.3 mL 0   dapagliflozin propanediol (FARXIGA) 10 MG TABS tablet Take 1 tablet (10 mg total) by mouth daily before breakfast. 90 tablet 1   ezetimibe (ZETIA) 10 MG tablet TAKE 1 TABLET EVERY DAY (Patient taking differently: Take 10 mg by mouth daily.) 90 tablet 3   furosemide (LASIX) 20 MG tablet Take 40 mg by mouth every Monday, Wednesday, and Friday. Prescribed By Dr.Foster, Lori ([facility])     latanoprost (XALATAN) 0.005 % ophthalmic solution Place 1 drop into both eyes at bedtime.      losartan (COZAAR) 100 MG tablet TAKE 1 TABLET EVERY DAY (Patient taking differently: Take 100 mg by mouth daily.) 90 tablet 3   Multiple Vitamins-Minerals (HEALTHY EYES/LUTEIN) TABS Take 1 tablet by mouth daily.     rosuvastatin (CRESTOR) 40 MG tablet TAKE 1 TABLET EVERY DAY (DISCONTINUE PRAVASTATIN) (Patient taking differently: Take 40 mg by mouth daily.) 90 tablet 10   timolol (BETIMOL) 0.5 % ophthalmic solution Place 1 drop into both eyes every morning.      triamcinolone 0.1% oint-Cerave equivalent lotion 1:1 mixture Apply topically 2 (two) times daily. 454 g 1    Allergies as of 11/10/2022 - Review Complete 11/10/2022  Allergen Reaction Noted   Penicillins Other (See Comments) 03/06/2011    Social History   Socioeconomic History   Marital status: Divorced    Spouse name:  Not on file   Number of children: 2   Years of education: Not on file   Highest education level: Not on file  Occupational History   Not on file  Tobacco Use   Smoking status: Former    Packs/day: 0.50    Years: 13.00    Additional pack years: 0.00    Total pack years: 6.50    Types: Cigarettes    Quit date: 03/09/1971    Years since quitting: 51.7   Smokeless tobacco: Never  Vaping Use   Vaping Use: Never used  Substance and Sexual Activity   Alcohol use: No    Alcohol/week: 0.0 standard drinks of alcohol   Drug use: No   Sexual activity: Not on file  Other Topics Concern   Not on file  Social History Narrative   Not on file   Social Determinants of Health   Financial Resource Strain: Low Risk  (04/18/2018)   Overall Financial Resource Strain (CARDIA)    Difficulty of Paying Living Expenses: Not hard at all  Food Insecurity: No Food Insecurity (04/18/2018)   Hunger Vital Sign    Worried About Running Out of Food in the Last Year: Never true    Ran Out of Food in the Last Year: Never true  Transportation Needs: No Transportation Needs (04/18/2018)   PRAPARE - Transportation    Lack of Transportation (Medical): No    Lack of Transportation (Non-Medical): No  Physical Activity: Insufficiently Active (04/18/2018)   Exercise Vital Sign    Days of Exercise per Week: 4 days    Minutes of Exercise per Session: 10 min  Stress: No Stress Concern Present (04/18/2018)   [employer] - Occupational Stress Questionnaire    Feeling of Stress : Not at all  Social Connections: Moderately Isolated (04/18/2018)   Social Connection and Isolation Panel [NHANES]    Frequency of Communication with Friends and Family: More than three times a week    Frequency of Social Gatherings with Friends and Family: More than three times a week    Attends   Religious Services: Never    Active Member of Clubs or Organizations: No    Attends Banker Meetings: Never     Marital Status: Divorced  Catering manager Violence: Not At Risk (04/18/2018)   Humiliation, Afraid, Rape, and Kick questionnaire    Fear of Current or Ex-Partner: No    Emotionally Abused: No    Physically Abused: No    Sexually Abused: No    Review of Systems: All systems reviewed and negative except where noted in HPI.  Physical Exam: Vital signs in last 24 hours: Temp:  [97.4 F (36.3 C)-98.9 F (37.2 C)] 98.1 F (36.7 C) (04/10 0630) Pulse Rate:  [78-101] 78 (04/10 0800) Resp:  [15-23] 15 (04/10 0800) BP: (114-154)/(46-107) 140/52 (04/10 0800) SpO2:  [100 %] 100 % (04/10 0800) Weight:  [71.7 kg] 71.7 kg (04/09 2053)    General:  Alert male in NAD Psych:  Pleasant, cooperative. Normal mood and affect Eyes: Pupils equal Ears:  Normal auditory acuity Nose: No deformity, discharge or lesions Neck:  Supple, no masses felt Lungs:  Clear to auscultation.  Heart:  Regular rate, regular rhythm. Loud murmur present Abdomen:  Soft, nondistended, nontender, active bowel sounds, no masses felt Rectal :  dark red blood in vault.  Msk: Symmetrical without gross deformities.  Neurologic:  Alert, oriented, grossly normal neurologically Extremities : No edema Skin:  Intact without significant lesions.    Intake/Output from previous day: 04/09 0701 - 04/10 0700 In: 370 [I.V.:20; Blood:350] Out: -  Intake/Output this shift:  No intake/output data recorded.    Principal Problem:   GI bleed Active Problems:   Aortic valve stenosis   Acute blood loss anemia   AKI (acute kidney injury)    Willette Cluster, NP-C @  11/11/2022, 9:07 AM

## 2022-11-11 NOTE — ED Notes (Signed)
Lab contacted concerning pt rpt cbc, lab needs recollect on pt sample. Floor RN Tamika notified.

## 2022-11-11 NOTE — Progress Notes (Signed)
Hgb did not improve after a unit of blood. Discussed with TRH. Another two units of blood being ordered.

## 2022-11-11 NOTE — TOC Initial Note (Signed)
Transition of Care [facility]) - Initial/Assessment Note    Patient Details  Name: [name] MRN: [id number] Date of Birth: [birth year]  [facility] CM/SW Contact:    Lawerance Sabal, RN Phone Number: 11/11/2022, 1:47 PM  Clinical Narrative:                  Patient admitted from home w adult children for GIB. Pending workup.  No active HH in [place]  Expected Discharge Plan: Home/Self Care Barriers to Discharge: Continued Medical Work up   Patient Goals and CMS Choice            Expected Discharge Plan and Services       Living arrangements for the past 2 months: Single Family Home                                      Prior Living Arrangements/Services Living arrangements for the past 2 months: Single Family Home Lives with:: Adult Children                   Activities of Daily Living   ADL Screening (condition at time of admission) Patient's cognitive ability adequate to safely complete daily activities?: Yes Is the patient deaf or have difficulty hearing?: No Does the patient have difficulty seeing, even when wearing glasses/contacts?: No Does the patient have difficulty concentrating, remembering, or making decisions?: No Patient able to express need for assistance with ADLs?: Yes Does the patient have difficulty dressing or bathing?: No Independently performs ADLs?: Yes (appropriate for developmental age) Does the patient have difficulty walking or climbing stairs?: No Weakness of Legs: None Weakness of Arms/Hands: None  Permission Sought/Granted                  Emotional Assessment              Admission diagnosis:  Hematochezia [K92.1] GI bleed [K92.2] Nausea and vomiting, unspecified vomiting type [R11.2] Patient Active Problem List   Diagnosis Date Noted   Acute blood loss anemia 11/11/2022   Acute kidney injury 11/11/2022   GI bleed 11/10/2022   Allergic rhinitis 06/03/2021   Mild cognitive impairment with  memory loss 07/22/2020   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 12/11/2018   Moderate aortic regurgitation 11/18/2017   Aortic valve stenosis 03/14/2017   PAOD (peripheral arterial occlusive disease) 11/02/2016   Claudication of lower extremity 07/26/2016   Glaucoma 12/27/2015   Essential hypertension 12/27/2015   Anemia in chronic renal disease 11/12/2014   Hyperglycemia 11/12/2014   Carotid stenosis, symptomatic, with infarction 06/05/2013   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 [place] [address] Phone: [phone] Fax: [phone]  [facility] - [address] [place] - [address] [place] [address] Phone: [phone] Fax: [phone]  [facility] (Now [facility]) - [address] [place], [place] - [address] [place] [address] Phone: [phone] Fax: [phone]     Social Determinants of Health (SDOH) Social History: SDOH Screenings   Food Insecurity: No  Food Insecurity (11/11/2022)  Housing: Low Risk  (11/11/2022)  Transportation Needs: No Transportation Needs (11/11/2022)  Utilities: Not At Risk (11/11/2022)  Alcohol Screen: Low Risk  (07/21/2018)  Depression (PHQ2-9): Low Risk  (05/05/2022)  Financial Resource Strain: Low Risk  (04/18/2018)  Physical Activity: Insufficiently Active (04/18/2018)  Social Connections: Moderately Isolated (04/18/2018)  Stress: No Stress Concern Present (04/18/2018)  Tobacco Use: Medium Risk (07/22/2022)   SDOH Interventions:     Readmission Risk Interventions     No data to display

## 2022-11-11 NOTE — Progress Notes (Signed)
PROGRESS NOTE    [name]  ION:[id number] DOB: [birth year] DOA: 11/10/2022 PCP: Frederica Kuster, MD    Brief Narrative:  82 year old gentleman with history of BPH, hypertension, hyperlipidemia, type 2 diabetes, heart failure with preserved ejection fraction, CKD stage IIIb presented to the emergency room with about 5 days of intermittent bloody stool.  In the emergency room blood pressures stable.  Hemoglobin 5.6 with recent hemoglobin of 11 last year.  No history of colonoscopy.  Creatinine 2.39 at about baseline.  Blood transfusion was started in the ER.  Admitted with GI consultation.   Assessment & Plan:   Acute lower GI bleeding, suspect diverticular bleeding. Anemia of acute blood loss:  Presentation hemoglobin 5.6-1 unit of PRBC transfusion overnight-repeat hemoglobin pending.  Currently no evidence of ongoing bleeding.  Continue maintenance IV fluids.  Will transfuse if less than 8. Seen by GI.  Offered upper GI endoscopy and colonoscopy.  Patient declined upper GI endoscopy.  Colonoscopy for tomorrow.  Clears.  AKI on CKD stage IIIb: Likely prerenal in the setting of GI bleed.  On maintenance IV fluids.  Repeat chemistry pending.  Troponin elevation: Likely demand ischemia.  No evidence of acute coronary syndrome.  Hyperkalemia: In the setting of using losartan.  On IV fluids.  Recheck levels today.  Essential hypertension: Blood pressure medications on hold.  As needed hydralazine.  Chronic heart failure with preserved ejection fraction: Lasix on hold.  Euvolemic.  Aortic stenosis by history: Admitting physician ordered echocardiogram.  Will follow-up.    DVT prophylaxis: SCDs Start: 11/11/22 1326SCDs   Code Status: Full code Family Communication: None at bedside Disposition Plan: Status is: Inpatient Remains inpatient appropriate because: Active GI bleeding, inpatient procedures planned     Consultants:  Gastroenterology  Procedures:   None  Antimicrobials:  None   Subjective: Patient seen and examined.  He was still at the emergency room.  Denies any complaints.  Denies any nausea vomiting after initial episode yesterday.  Reported to dark bowel movement since coming to the emergency room for last 12 hours.  No active bleeding or hemorrhagic substance seen.  Objective: Vitals:   11/11/22 0915 11/11/22 1016 11/11/22 1200 11/11/22 1323  BP:   (!) 135/54 (!) 148/32  Pulse: 86  82 88  Resp: Temp:  98.6 F (37 C)  97.9 F (36.6 C)  TempSrc:  Oral  Oral  SpO2: 100%  100% 100%  Weight:      Height:        Intake/Output Summary (Last 24 hours) at 11/11/2022 1331 Last data filed at 11/11/2022 0438 Gross per 24 hour  Intake 370 ml  Output --  Net 370 ml   Filed Weights   11/10/22 2053  Weight: 71.7 kg    Examination:  General exam: Appears calm. Respiratory system: Clear to auscultation. Respiratory effort normal. Cardiovascular system: S1 & S2 heard, RRR. No pedal edema. Gastrointestinal system: Soft.  Nontender.  Bowel sound present.   Central nervous system: Alert and oriented. No focal neurological deficits. Extremities: Symmetric 5 x 5 power. Skin: No rashes, lesions or ulcers Psychiatry: Judgement and insight appear normal. Mood & affect appropriate.     Data Reviewed: I have personally reviewed following labs and imaging studies  CBC: Recent Labs  Lab 11/10/22 2138  WBC 8.5  NEUTROABS 6.4  HGB 5.6*  HCT 18.4*  MCV 95.8  PLT 236   Basic Metabolic Panel: Recent Labs  Lab 11/10/22 2138  NA  141  K 5.5*  CL 109  CO2 18*  GLUCOSE 187*  BUN 68*  CREATININE 2.39*  CALCIUM 9.2   GFR: Estimated Creatinine Clearance: 24.2 mL/min (A) (by C-G formula based on SCr of 2.39 mg/dL (H)). Liver Function Tests: Recent Labs  Lab 11/10/22 2138  AST 28  ALT 26  ALKPHOS 46  BILITOT 0.2*  PROT 6.3*  ALBUMIN 3.1*   No results for input(s): "LIPASE", "AMYLASE" in the last 168  hours. No results for input(s): "AMMONIA" in the last 168 hours. Coagulation Profile: Recent Labs  Lab 11/10/22 2250  INR 1.2   Cardiac Enzymes: No results for input(s): "CKTOTAL", "CKMB", "CKMBINDEX", "TROPONINI" in the last 168 hours. BNP (last 3 results) No results for input(s): "PROBNP" in the last 8760 hours. HbA1C: No results for input(s): "HGBA1C" in the last 72 hours. CBG: No results for input(s): "GLUCAP" in the last 168 hours. Lipid Profile: No results for input(s): "CHOL", "HDL", "LDLCALC", "TRIG", "CHOLHDL", "LDLDIRECT" in the last 72 hours. Thyroid Function Tests: No results for input(s): "TSH", "T4TOTAL", "FREET4", "T3FREE", "THYROIDAB" in the last 72 hours. Anemia Panel: No results for input(s): "VITAMINB12", "FOLATE", "FERRITIN", "TIBC", "IRON", "RETICCTPCT" in the last 72 hours. Sepsis Labs: No results for input(s): "PROCALCITON", "LATICACIDVEN" in the last 168 hours.  Recent Results (from the past 240 hour(s))  Resp panel by RT-PCR (RSV, Flu A&B, Covid) Anterior Nasal Swab     Status: None   Collection Time: 11/10/22  1:03 AM   Specimen: Anterior Nasal Swab  Result Value Ref Range Status   SARS Coronavirus 2 by RT PCR NEGATIVE NEGATIVE Final   Influenza A by PCR NEGATIVE NEGATIVE Final   Influenza B by PCR NEGATIVE NEGATIVE Final    Comment: (NOTE) The Xpert Xpress SARS-CoV-2/FLU/RSV plus assay is intended as an aid in the diagnosis of influenza from Nasopharyngeal swab specimens and should not be used as a sole basis for treatment. Nasal washings and aspirates are unacceptable for Xpert Xpress SARS-CoV-2/FLU/RSV testing.  Fact Sheet for Patients: BloggerCourse.comhttps://www.fda.gov/media/152166/download  Fact Sheet for Healthcare Providers: SeriousBroker.ithttps://www.fda.gov/media/152162/download  This test is not yet approved or cleared by the [place] FDA and has been authorized for detection and/or diagnosis of SARS-CoV-2 by FDA under an Emergency Use Authorization (EUA).  This EUA will remain in effect (meaning this test can be used) for the duration of the COVID-19 declaration under Section 564(b)(1) of the Act, 21 U.S.C. section 360bbb-3(b)(1), unless the authorization is terminated or revoked.     Resp Syncytial Virus by PCR NEGATIVE NEGATIVE Final    Comment: (NOTE) Fact Sheet for Patients: BloggerCourse.comhttps://www.fda.gov/media/152166/download  Fact Sheet for Healthcare Providers: SeriousBroker.ithttps://www.fda.gov/media/152162/download  This test is not yet approved or cleared by the [place] FDA and has been authorized for detection and/or diagnosis of SARS-CoV-2 by FDA under an Emergency Use Authorization (EUA). This EUA will remain in effect (meaning this test can be used) for the duration of the COVID-19 declaration under Section 564(b)(1) of the Act, 21 U.S.C. section 360bbb-3(b)(1), unless the authorization is terminated or revoked.  Performed at [facility], [address] [place], [place] [address]          Radiology Studies: CT ABDOMEN PELVIS WO CONTRAST  Result Date: 11/11/2022 CLINICAL DATA:  Abdominal pain, vomiting EXAM: CT ABDOMEN AND PELVIS WITHOUT CONTRAST TECHNIQUE: Multidetector CT imaging of the abdomen and pelvis was performed following the standard protocol without IV contrast. RADIATION DOSE REDUCTION: This exam was performed according to the departmental dose-optimization program which  includes automated exposure control, adjustment of the mA and/or kV according to patient size and/or use of iterative reconstruction technique. COMPARISON:  None Available. FINDINGS: Lower chest: No acute abnormality Hepatobiliary: No focal hepatic abnormality. Gallbladder unremarkable. Pancreas: No focal abnormality or ductal dilatation. Spleen: No focal abnormality.  Normal size. Adrenals/Urinary Tract: No adrenal abnormality. No focal renal abnormality. No stones or hydronephrosis. Urinary bladder is unremarkable. Stomach/Bowel: Diffuse colonic  diverticulosis. No active diverticulitis. Stomach and small bowel grossly unremarkable. No bowel obstruction. Vascular/Lymphatic: Diffuse aortoiliac atherosclerosis. No evidence of aneurysm or adenopathy. Reproductive: Markedly enlarged prostate with a transverse diameter of 6.4 cm. Other: No free fluid or free air. Musculoskeletal: No acute bony abnormality. IMPRESSION: Diffuse colonic diverticulosis.  No active diverticulitis. No evidence of bowel obstruction. Aortoiliac atherosclerosis. Prostate enlargement. Electronically Signed   By: Charlett Nose M.D.   On: 11/11/2022 03:33   DG Chest Portable 1 View  Result Date: 11/10/2022 CLINICAL DATA:  Shortness of breath EXAM: PORTABLE CHEST 1 VIEW COMPARISON:  11/29/2019 FINDINGS: No acute airspace disease. Stable cardiomediastinal silhouette with aortic atherosclerosis. No pneumothorax. IMPRESSION: No active disease. Electronically Signed   By: Jasmine Pang M.D.   On: 11/10/2022 22:14        Scheduled Meds:  ezetimibe  10 mg Oral Daily   latanoprost  1 drop Both Eyes QHS   rosuvastatin  40 mg Oral Daily   [START ON 11/12/2022] timolol  1 drop Both Eyes [id number]   Continuous Infusions:  sodium chloride 75 mL/hr at 11/11/22 0246     LOS: 1 day    Time spent: 35 minutes    Dorcas Carrow, MD [facility] Pager [phone]

## 2022-11-11 NOTE — Consult Note (Signed)
Consultation Note   Referring Provider:  [facility] PCP: Frederica Kuster, MD Primary Gastroenterologist: Gentry Fitz        Reason for consultation: hematochezia  Hospital Day: 2   Assessment   82 year old male with the following:  *Hemodynamically stable painless hematochezia ( dark red blood on exam).  Suspect diverticular hemorrhage  *Severe anemia ( acute on chronic). Hgb 5, down from 11.5 in April 2022 S/p 1 u PRBC. Follow up CBC pending  *Elevated high sensitivity troponin.  No chest pain . Secondary to demand ischemia?  *AKI on CKD 3 Cr 2.39 ( baseline~ 1.8)  *Diastolic heart failure Echocardiogram results are pending  *Aortic valve stenosis   Plan   -Awaiting follow up CBC results -Clear liquids okay -CT angio not ideal given AKI / CKD3. Hopefully bleeding will stop.  -Will need colonoscopy at some point.  Could possibly be done tomorrow if he is medically stable. The risks and benefits of colonoscopy with possible polypectomy / biopsies were discussed and the patient agrees to proceed.  -Unlikely an upper GI bleed but we did discuss that sometimes bleeding can be from an upper source necessitating upper endoscopy. He adamantly refuses EGD should it become necessary at some point.    History of Present Illness   Patient is a 82 y.o. year old male with a past medical history of  CKD3  diastolic heart failure, HTN, AVS, PAD, CVA, diverticulosis, enlarged prostate see [facility] for any additional medical problems.  Patient brought to ED with blood in stool. Family found him confused and shaking.  He describes black stool for about 3 days. No abdominal pain. He had episode of emesis containing food yesterday. No blood in emesis. He takes a daily baby asa. No other NSAID use. Not anticoagulated. Appetite has been good, no recent weight loss. He has never had a colonoscopy. No FMH of colon cancer.   ED workup:  WBC  8.5, hemoglobin 5.6, MCV 95 INR 1.2 Potassium 5.5, BUN 68, creatinine 2.39 High-sensitivity troponin 108 >> 107    CT scan without contrast Diffuse colonic diverticulosis.  No active diverticulitis.No evidence of bowel obstruction. Aortoiliac atherosclerosis. Prostate enlargement.   Previous GI History / Evaluation :  none   Recent Labs    11/10/22 2138  WBC 8.5  HGB 5.6*  HCT 18.4*  PLT 236   Recent Labs    11/10/22 2138  NA 141  K 5.5*  CL 109  CO2 18*  GLUCOSE 187*  BUN 68*  CREATININE 2.39*  CALCIUM 9.2   Recent Labs    11/10/22 2138  PROT 6.3*  ALBUMIN 3.1*  AST 28  ALT 26  ALKPHOS 46  BILITOT 0.2*   No results for input(s): "HEPBSAG", "HCVAB", "HEPAIGM", "HEPBIGM" in the last 72 hours. 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) 12/11/2018   Hyperlipidemia    Hypertension    Hypertensive kidney disease, benign    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)    Pure hypercholesterolemia    Unspecified vitamin D deficiency     Past Surgical History:  Procedure Laterality Date   CATARACT EXTRACTION     EYE SURGERY     left eye,    cornea repair   PERIPHERAL VASCULAR CATHETERIZATION N/A 07/28/2016   Procedure: Lower Extremity Angiography;  Surgeon: Yates DecampJay Ganji, MD;  [facility];  Service: Cardiovascular;  Laterality: N/A;    Family History  Problem Relation Age of Onset   Cancer Father     Prior to Admission medications   Medication Sig Start Date End Date Taking? Authorizing Provider  amLODipine (NORVASC) 5 MG tablet  TAKE 1 TABLET EVERY DAY Patient taking differently: Take 5 mg by mouth daily. 10/23/22   Yates DecampGanji, Jay, MD  aspirin 81 MG tablet Take 81 mg by mouth daily.    [provider]  Calcium Carbonate-Vit D-Min (CALTRATE 600+D PLUS PO) Take by mouth. 1 tablet 2 times a day    [provider]  camphor-menthol (SARNA) lotion Apply 1 application topically as needed for itching. 01/01/21   Frederica KusterMiller, Stephen M, MD  COVID-19 mRNA vaccine [id number] (COMIRNATY) syringe Inject into the muscle. 07/24/22   Judyann MunsonSnider, Cynthia, MD  dapagliflozin propanediol (FARXIGA) 10 MG TABS tablet Take 1 tablet (10 mg total) by mouth daily before breakfast. 01/27/22   Frederica KusterMiller, Stephen M, MD  ezetimibe (ZETIA) 10 MG tablet TAKE 1 TABLET EVERY DAY Patient taking differently: Take 10 mg by mouth daily. 02/09/22   Frederica KusterMiller, Stephen M, MD  furosemide (LASIX) 20 MG tablet Take 40 mg by mouth every Monday, Wednesday, and Friday. Prescribed By Dr.Foster, Lawson FiscalLori ([facility]) 08/06/22   [provider]  latanoprost (XALATAN) 0.005 % ophthalmic solution Place 1 drop into both eyes at bedtime.  12/18/15   [provider]  losartan (COZAAR) 100 MG tablet TAKE 1 TABLET EVERY DAY Patient taking differently: Take 100 mg by mouth daily. 08/04/22   Frederica KusterMiller, Stephen M, MD  Multiple Vitamins-Minerals (HEALTHY EYES/LUTEIN) TABS Take 1 tablet by mouth daily.    [provider]  rosuvastatin (CRESTOR) 40 MG tablet TAKE 1 TABLET EVERY DAY (DISCONTINUE PRAVASTATIN) Patient taking differently: Take 40 mg by mouth daily. 05/21/22   Yates DecampGanji, Jay, MD  timolol (BETIMOL) 0.5 % ophthalmic solution Place 1 drop into both eyes every morning.     [provider]  triamcinolone 0.1% oint-Cerave equivalent lotion 1:1 mixture Apply topically 2 (two) times daily. 05/23/21   Frederica KusterMiller, Stephen M, MD    Current Facility-Administered Medications  Medication Dose Route Frequency Provider Last Rate Last Admin   0.9 %  sodium  chloride infusion   Intravenous Continuous Albertine GratesXu, Fang, MD 75 mL/hr at 11/11/22 0246 New Bag at 11/11/22 0246   Current Outpatient Medications  Medication Sig Dispense Refill   amLODipine (NORVASC) 5 MG tablet TAKE 1 TABLET EVERY DAY (Patient taking differently: Take 5 mg by mouth daily.) 90 tablet 3   aspirin 81 MG tablet Take 81 mg by mouth daily.     Calcium Carbonate-Vit D-Min (CALTRATE 600+D PLUS PO) Take by mouth. 1 tablet 2 times a day     camphor-menthol (SARNA) lotion Apply 1 application topically as needed for itching. 222 mL 0   COVID-19 mRNA vaccine 2023-2024 (COMIRNATY) syringe Inject  into the muscle. 0.3 mL 0   dapagliflozin propanediol (FARXIGA) 10 MG TABS tablet Take 1 tablet (10 mg total) by mouth daily before breakfast. 90 tablet 1   ezetimibe (ZETIA) 10 MG tablet TAKE 1 TABLET EVERY DAY (Patient taking differently: Take 10 mg by mouth daily.) 90 tablet 3   furosemide (LASIX) 20 MG tablet Take 40 mg by mouth every Monday, Wednesday, and Friday. Prescribed By Dr.Foster, Lawson Fiscal ([facility])     latanoprost (XALATAN) 0.005 % ophthalmic solution Place 1 drop into both eyes at bedtime.      losartan (COZAAR) 100 MG tablet TAKE 1 TABLET EVERY DAY (Patient taking differently: Take 100 mg by mouth daily.) 90 tablet 3   Multiple Vitamins-Minerals (HEALTHY EYES/LUTEIN) TABS Take 1 tablet by mouth daily.     rosuvastatin (CRESTOR) 40 MG tablet TAKE 1 TABLET EVERY DAY (DISCONTINUE PRAVASTATIN) (Patient taking differently: Take 40 mg by mouth daily.) 90 tablet 10   timolol (BETIMOL) 0.5 % ophthalmic solution Place 1 drop into both eyes every morning.      triamcinolone 0.1% oint-Cerave equivalent lotion 1:1 mixture Apply topically 2 (two) times daily. 454 g 1    Allergies as of 11/10/2022 - Review Complete 11/10/2022  Allergen Reaction Noted   Penicillins Other (See Comments) 03/06/2011    Social History   Socioeconomic History   Marital status: Divorced    Spouse name:  Not on file   Number of children: 2   Years of education: Not on file   Highest education level: Not on file  Occupational History   Not on file  Tobacco Use   Smoking status: Former    Packs/day: 0.50    Years: 13.00    Additional pack years: 0.00    Total pack years: 6.50    Types: Cigarettes    Quit date: 03/09/1971    Years since quitting: 51.7   Smokeless tobacco: Never  Vaping Use   Vaping Use: Never used  Substance and Sexual Activity   Alcohol use: No    Alcohol/week: 0.0 standard drinks of alcohol   Drug use: No   Sexual activity: Not on file  Other Topics Concern   Not on file  Social History Narrative   Not on file   Social Determinants of Health   Financial Resource Strain: Low Risk  (04/18/2018)   Overall Financial Resource Strain (CARDIA)    Difficulty of Paying Living Expenses: Not hard at all  Food Insecurity: No Food Insecurity (04/18/2018)   Hunger Vital Sign    Worried About Running Out of Food in the Last Year: Never true    Ran Out of Food in the Last Year: Never true  Transportation Needs: No Transportation Needs (04/18/2018)   PRAPARE - Administrator, Civil Service (Medical): No    Lack of Transportation (Non-Medical): No  Physical Activity: Insufficiently Active (04/18/2018)   Exercise Vital Sign    Days of Exercise per Week: 4 days    Minutes of Exercise per Session: 10 min  Stress: No Stress Concern Present (04/18/2018)   [employer] of Occupational Health - Occupational Stress Questionnaire    Feeling of Stress : Not at all  Social Connections: Moderately Isolated (04/18/2018)   Social Connection and Isolation Panel [NHANES]    Frequency of Communication with Friends and Family: More than three times a week    Frequency of Social Gatherings with Friends and Family: More than three times a week    Attends  Religious Services: Never    Active Member of Clubs or Organizations: No    Attends Banker Meetings: Never     Marital Status: Divorced  Catering manager Violence: Not At Risk (04/18/2018)   Humiliation, Afraid, Rape, and Kick questionnaire    Fear of Current or Ex-Partner: No    Emotionally Abused: No    Physically Abused: No    Sexually Abused: No    Review of Systems: All systems reviewed and negative except where noted in HPI.  Physical Exam: Vital signs in last 24 hours: Temp:  [97.4 F (36.3 C)-98.9 F (37.2 C)] 98.1 F (36.7 C) (04/10 0630) Pulse Rate:  [78-101] 78 (04/10 0800) Resp:  [15-23] 15 (04/10 0800) BP: (114-154)/(46-107) 140/52 (04/10 0800) SpO2:  [100 %] 100 % (04/10 0800) Weight:  [71.7 kg] 71.7 kg (04/09 2053)    General:  Alert male in NAD Psych:  Pleasant, cooperative. Normal mood and affect Eyes: Pupils equal Ears:  Normal auditory acuity Nose: No deformity, discharge or lesions Neck:  Supple, no masses felt Lungs:  Clear to auscultation.  Heart:  Regular rate, regular rhythm. Loud murmur present Abdomen:  Soft, nondistended, nontender, active bowel sounds, no masses felt Rectal :  dark red blood in vault.  Msk: Symmetrical without gross deformities.  Neurologic:  Alert, oriented, grossly normal neurologically Extremities : No edema Skin:  Intact without significant lesions.    Intake/Output from previous day: 04/09 0701 - 04/10 0700 In: 370 [I.V.:20; Blood:350] Out: -  Intake/Output this shift:  No intake/output data recorded.    Principal Problem:   GI bleed Active Problems:   Aortic valve stenosis   Acute blood loss anemia   AKI (acute kidney injury)    Willette Cluster, NP-C @  11/11/2022, 9:07 AM

## 2022-11-11 NOTE — ED Notes (Signed)
.ED TO INPATIENT HANDOFF REPORT  ED Nurse Name and Phone #: [phone]  S Name/Age/Gender [name] 82 y.o. male Room/Bed: 038C/038C  Code Status   Code Status: Full Code  Home/SNF/Other Home Patient oriented to: self, place, time, and situation Is this baseline? Yes   Triage Complete: Triage complete  Chief Complaint GI bleed [K92.2]  Triage Note Pt BIB [facility] from home. CC: blood in stool, right leg pain. Pt had 2 vomiting episodes, mumbling noticed by roommate.  Hx: stroke 2008, HTN    Allergies Allergies  Allergen Reactions   Penicillins Other (See Comments)    Unknown  Has patient had a PCN reaction causing immediate rash, facial/tongue/throat swelling, SOB or lightheadedness with hypotension: {unknown Has patient had a PCN reaction causing severe rash involving mucus membranes or skin necrosis: unknown Has patient had a PCN reaction that required hospitalization {unknown Has patient had a PCN reaction occurring within the last 10 years: no If all of the above answers are "NO", then may proceed with Cephalosporin use.    Level of Care/Admitting Diagnosis ED Disposition     ED Disposition  Admit   Condition  --   Comment  [facility] [[id number]]  Level of Care: Telemetry Medical [104]  [facility] or [facility] if equivalent level of care is available:: No  Covid Evaluation: Confirmed COVID Negative  Diagnosis: GI bleed [[id number]][[id number]]  Admitting Physician: Albertine GratesXU, FANG [[id number]]  Attending Physician: Albertine GratesXU, FANG [[id number]]  Certification:: I certify this patient will need inpatient services for at least 2 midnights  Estimated Length of Stay: 2          B Medical/Surgery History Past Medical History:  Diagnosis Date   Anemia    Cataract    traumatic   Chronic diastolic heart failure (HCC)    Chronic kidney disease, stage III (moderate) (HCC)    Depressive disorder, not elsewhere classified    Elevated  prostate specific antigen (PSA)    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   Hyperlipidemia    Hypertension    Hypertensive kidney disease, benign    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)    Pure hypercholesterolemia    Unspecified vitamin D deficiency    Past Surgical History:  Procedure Laterality Date   CATARACT EXTRACTION     EYE SURGERY     left eye,    cornea repair   PERIPHERAL VASCULAR CATHETERIZATION N/A 07/28/2016   Procedure: Lower Extremity Angiography;  Surgeon: Yates DecampJay Ganji, MD;  [facility];  Service: Cardiovascular;  Laterality: N/A;     A IV Location/Drains/Wounds Patient Lines/Drains/Airways Status     Active Line/Drains/Airways     Name Placement date Placement time Site Days   Peripheral IV 11/29/19 20 G Left Forearm 11/29/19  0004  Forearm  1078   Peripheral IV 11/10/22 20 G Anterior;Left;Proximal Forearm 11/10/22  2108  Forearm  1            Intake/Output Last 24 hours  Intake/Output Summary (Last 24 hours) at 11/11/2022 1211 Last data filed at 11/11/2022 0438 Gross per 24 hour  Intake 370 ml  Output --  Net 370 ml    Labs/Imaging Results for orders placed or performed during the hospital encounter of 11/10/22 (from the past 48 hour(s))  Resp panel by RT-PCR (RSV, Flu A&B, Covid) Anterior Nasal Swab  Status: None   Collection Time: 11/10/22  1:03 AM   Specimen: Anterior Nasal Swab  Result Value Ref Range   SARS Coronavirus 2 by RT PCR NEGATIVE NEGATIVE   Influenza A by PCR NEGATIVE NEGATIVE   Influenza B by PCR NEGATIVE NEGATIVE    Comment: (NOTE) The Xpert Xpress SARS-CoV-2/FLU/RSV plus assay is intended as an aid in the diagnosis of influenza from Nasopharyngeal swab specimens and should not be used as a sole basis for treatment. Nasal washings and aspirates are unacceptable for Xpert Xpress  SARS-CoV-2/FLU/RSV testing.  Fact Sheet for Patients: BloggerCourse.com  Fact Sheet for Healthcare Providers: SeriousBroker.it  This test is not yet approved or cleared by the [place] FDA and has been authorized for detection and/or diagnosis of SARS-CoV-2 by FDA under an Emergency Use Authorization (EUA). This EUA will remain in effect (meaning this test can be used) for the duration of the COVID-19 declaration under Section 564(b)(1) of the Act, 21 U.S.C. section 360bbb-3(b)(1), unless the authorization is terminated or revoked.     Resp Syncytial Virus by PCR NEGATIVE NEGATIVE    Comment: (NOTE) Fact Sheet for Patients: BloggerCourse.com  Fact Sheet for Healthcare Providers: SeriousBroker.it  This test is not yet approved or cleared by the [place] FDA and has been authorized for detection and/or diagnosis of SARS-CoV-2 by FDA under an Emergency Use Authorization (EUA). This EUA will remain in effect (meaning this test can be used) for the duration of the COVID-19 declaration under Section 564(b)(1) of the Act, 21 U.S.C. section 360bbb-3(b)(1), unless the authorization is terminated or revoked.  Performed at [facility], [address] [place], [place] [address]   CBC with Differential     Status: Abnormal   Collection Time: 11/10/22  9:38 PM  Result Value Ref Range   WBC 8.5 4.0 - 10.5 K/uL   RBC 1.92 (L) 4.22 - 5.81 MIL/uL   Hemoglobin 5.6 (LL) 13.0 - 17.0 g/dL    Comment: REPEATED TO VERIFY THIS CRITICAL RESULT HAS VERIFIED AND BEEN CALLED TO BRANT,M RN BY FARZANEH AMIREHSANI ON 04 09 2024 AT 2216, AND HAS BEEN READ BACK.     HCT 18.4 (L) 39.0 - 52.0 %   MCV 95.8 80.0 - 100.0 fL   MCH 29.2 26.0 - 34.0 pg   MCHC 30.4 30.0 - 36.0 g/dL   RDW 60.4 (H) 54.0 - 98.1 %   Platelets 236 150 - 400 K/uL   nRBC 0.0 0.0 - 0.2 %   Neutrophils Relative % 75 %    Neutro Abs 6.4 1.7 - 7.7 K/uL   Lymphocytes Relative 12 %   Lymphs Abs 1.0 0.7 - 4.0 K/uL   Monocytes Relative 10 %   Monocytes Absolute 0.8 0.1 - 1.0 K/uL   Eosinophils Relative 3 %   Eosinophils Absolute 0.2 0.0 - 0.5 K/uL   Basophils Relative 0 %   Basophils Absolute 0.0 0.0 - 0.1 K/uL   Immature Granulocytes 0 %   Abs Immature Granulocytes 0.03 0.00 - 0.07 K/uL    Comment: Performed at [facility], [address], [place], [place] [address]  Comprehensive metabolic panel     Status: Abnormal   Collection Time: 11/10/22  9:38 PM  Result Value Ref Range   Sodium 141 135 - 145 mmol/L   Potassium 5.5 (H) 3.5 - 5.1 mmol/L   Chloride 109 98 - 111 mmol/L   CO2 18 (L) 22 - 32 mmol/L   Glucose, Bld 187 (  H) 70 - 99 mg/dL    Comment: Glucose reference range applies only to samples taken after fasting for at least 8 hours.   BUN 68 (H) 8 - 23 mg/dL   Creatinine, Ser 1.61 (H) 0.61 - 1.24 mg/dL   Calcium 9.2 8.9 - 09.6 mg/dL   Total Protein 6.3 (L) 6.5 - 8.1 g/dL   Albumin 3.1 (L) 3.5 - 5.0 g/dL   AST 28 15 - 41 U/L   ALT 26 0 - 44 U/L   Alkaline Phosphatase 46 38 - 126 U/L   Total Bilirubin 0.2 (L) 0.3 - 1.2 mg/dL   GFR, Estimated 27 (L) >60 mL/min    Comment: (NOTE) Calculated using the CKD-EPI Creatinine Equation (2021)    Anion gap 14 5 - 15    Comment: Performed at [facility], [address], [place], [place] [address]  Type and screen [facility]     Status: None (Preliminary result)   Collection Time: 11/10/22  9:38 PM  Result Value Ref Range   ABO/RH(D) O POS    Antibody Screen NEG    Sample Expiration 11/13/2022,2359    Unit Number [id number]    Blood Component Type RBC LR PHER1    Unit division 00    Status of Unit ISSUED    Transfusion Status OK TO TRANSFUSE    Crossmatch Result      Compatible Performed at [facility], [address] [place], [place] [address]    Unit Number [id number]    Blood Component Type RED  CELLS,LR    Unit division 00    Status of Unit ALLOCATED    Transfusion Status OK TO TRANSFUSE    Crossmatch Result Compatible   Troponin I (High Sensitivity)     Status: Abnormal   Collection Time: 11/10/22  9:38 PM  Result Value Ref Range   Troponin I (High Sensitivity) 108 (HH) <18 ng/L    Comment: CRITICAL RESULT CALLED TO, READ BACK BY AND VERIFIED WITH A. ARTEAGA,RN. 11/10/22. LPAIT (NOTE) Elevated high sensitivity troponin I (hsTnI) values and significant  changes across serial measurements may suggest ACS but many other  chronic and acute conditions are known to elevate hsTnI results.  Refer to the "Links" section for chest pain algorithms and additional  guidance. Performed at [facility], [address] [place], [place] [address]   Protime-INR     Status: None   Collection Time: 11/10/22 10:50 PM  Result Value Ref Range   Prothrombin Time 15.2 11.4 - 15.2 seconds   INR 1.2 0.8 - 1.2    Comment: (NOTE) INR goal varies based on device and disease states. Performed at [facility], [address] [place], [place] [address]   Troponin I (High Sensitivity)     Status: Abnormal   Collection Time: 11/10/22 10:50 PM  Result Value Ref Range   Troponin I (High Sensitivity) 107 (HH) <18 ng/L    Comment: CRITICAL VALUE NOTED. VALUE IS CONSISTENT WITH PREVIOUSLY REPORTED/CALLED VALUE (NOTE) Elevated high sensitivity troponin I (hsTnI) values and significant  changes across serial measurements may suggest ACS but many other  chronic and acute conditions are known to elevate hsTnI results.  Refer to the "Links" section for chest pain algorithms and additional  guidance. Performed at [facility], [address] [place], [place] [address]   Prepare RBC (crossmatch)     Status: None   Collection Time: 11/10/22 11:05 PM  Result Value Ref Range   Order Confirmation      ORDER PROCESSED BY BLOOD BANK Performed at [facility], [address]  [place], [place] [address]   POC occult blood, ED Provider will collect     Status: Abnormal   Collection Time: 11/11/22 12:54 AM  Result Value Ref Range   Fecal Occult Bld POSITIVE (A) NEGATIVE  Urinalysis, Routine w reflex microscopic -Urine, Clean Catch     Status: Abnormal   Collection Time: 11/11/22  4:58 AM  Result Value Ref Range   Color, Urine YELLOW YELLOW   APPearance CLEAR CLEAR   Specific Gravity, Urine 1.015 1.005 - 1.030   pH 5.5 5.0 - 8.0   Glucose, UA 100 (A) NEGATIVE mg/dL   Hgb urine dipstick NEGATIVE NEGATIVE   Bilirubin Urine NEGATIVE NEGATIVE   Ketones, ur NEGATIVE NEGATIVE mg/dL   Protein, ur NEGATIVE NEGATIVE mg/dL   Nitrite NEGATIVE NEGATIVE   Leukocytes,Ua NEGATIVE NEGATIVE    Comment: Microscopic not done on urines with negative protein, blood, leukocytes, nitrite, or glucose < 500 mg/dL. Performed at [facility], [address], [place], [place] [address]    CT ABDOMEN PELVIS WO CONTRAST  Result Date: 11/11/2022 CLINICAL DATA:  Abdominal pain, vomiting EXAM: CT ABDOMEN AND PELVIS WITHOUT CONTRAST TECHNIQUE: Multidetector CT imaging of the abdomen and pelvis was performed following the standard protocol without IV contrast. RADIATION DOSE REDUCTION: This exam was performed according to the departmental dose-optimization program which includes automated exposure control, adjustment of the mA and/or kV according to patient size and/or use of iterative reconstruction technique. COMPARISON:  None Available. FINDINGS: Lower chest: No acute abnormality Hepatobiliary: No focal hepatic abnormality. Gallbladder unremarkable. Pancreas: No focal abnormality or ductal dilatation. Spleen: No focal abnormality.  Normal size. Adrenals/Urinary Tract: No adrenal abnormality. No focal renal abnormality. No stones or hydronephrosis. Urinary bladder is unremarkable. Stomach/Bowel: Diffuse colonic diverticulosis. No active diverticulitis. Stomach and small bowel grossly unremarkable.  No bowel obstruction. Vascular/Lymphatic: Diffuse aortoiliac atherosclerosis. No evidence of aneurysm or adenopathy. Reproductive: Markedly enlarged prostate with a transverse diameter of 6.4 cm. Other: No free fluid or free air. Musculoskeletal: No acute bony abnormality. IMPRESSION: Diffuse colonic diverticulosis.  No active diverticulitis. No evidence of bowel obstruction. Aortoiliac atherosclerosis. Prostate enlargement. Electronically Signed   By: Charlett Nose M.D.   On: 11/11/2022 03:33   DG Chest Portable 1 View  Result Date: 11/10/2022 CLINICAL DATA:  Shortness of breath EXAM: PORTABLE CHEST 1 VIEW COMPARISON:  11/29/2019 FINDINGS: No acute airspace disease. Stable cardiomediastinal silhouette with aortic atherosclerosis. No pneumothorax. IMPRESSION: No active disease. Electronically Signed   By: Jasmine Pang M.D.   On: 11/10/2022 22:14    Pending Labs Unresulted Labs (From admission, onward)     Start     Ordered   11/11/22 1003  Comprehensive metabolic panel  ONCE - STAT,   STAT        11/11/22 1003   11/11/22 1002  CBC with Differential  ONCE - STAT,   STAT        11/11/22 1002   Signed and Held  CBC  Tomorrow morning,   R        Signed and Held   Signed and Held  Basic metabolic panel  Tomorrow morning,   R        Signed and Held            Vitals/Pain Today's Vitals   11/11/22 0900 11/11/22 0915 11/11/22 1016  11/11/22 1200  BP: (!) 141/55   (!) 135/54  Pulse: 84 86  82  Resp: (!) 22 18  20   Temp:   98.6 F (37 C)   TempSrc:   Oral   SpO2: 99% 100%  100%  Weight:      Height:      PainSc:        Isolation Precautions No active isolations  Medications Medications  0.9 %  sodium chloride infusion ( Intravenous New Bag/Given 11/11/22 0246)  0.9 %  sodium chloride infusion (Manually program via Guardrails IV Fluids) (0 mLs Intravenous Stopped 11/11/22 0438)    Mobility walks     Focused Assessments Cardiac Assessment Handoff:  Cardiac Rhythm: Sinus  tachycardia No results found for: "CKTOTAL", "CKMB", "CKMBINDEX", "TROPONINI" No results found for: "DDIMER" Does the Patient currently have chest pain? No   , Neuro Assessment Handoff:  Swallow screen pass? Yes  Cardiac Rhythm: Sinus tachycardia       Neuro Assessment: Within Defined Limits Neuro Checks:      Has TPA been given? No If patient is a Neuro Trauma and patient is going to OR before floor call report to 4N Charge nurse: [phone] or [phone]   R Recommendations: See Admitting Provider Note  Report given to:   Additional Notes: NA

## 2022-11-12 ENCOUNTER — Inpatient Hospital Stay (HOSPITAL_COMMUNITY): Payer: Medicare HMO | Admitting: Certified Registered Nurse Anesthetist

## 2022-11-12 ENCOUNTER — Encounter (HOSPITAL_COMMUNITY)
Admission: EM | Disposition: A | Discharge status: Home Health | Payer: Self-pay | Source: Home / Self Care | Attending: Internal Medicine

## 2022-11-12 ENCOUNTER — Inpatient Hospital Stay (HOSPITAL_COMMUNITY): Payer: Medicare HMO

## 2022-11-12 ENCOUNTER — Encounter (HOSPITAL_COMMUNITY): Payer: Self-pay | Admitting: Internal Medicine

## 2022-11-12 DIAGNOSIS — D120 Benign neoplasm of cecum: Secondary | ICD-10-CM | POA: Diagnosis not present

## 2022-11-12 DIAGNOSIS — K5521 Angiodysplasia of colon with hemorrhage: Secondary | ICD-10-CM

## 2022-11-12 DIAGNOSIS — I351 Nonrheumatic aortic (valve) insufficiency: Secondary | ICD-10-CM

## 2022-11-12 DIAGNOSIS — R7989 Other specified abnormal findings of blood chemistry: Secondary | ICD-10-CM

## 2022-11-12 DIAGNOSIS — K648 Other hemorrhoids: Secondary | ICD-10-CM

## 2022-11-12 DIAGNOSIS — I350 Nonrheumatic aortic (valve) stenosis: Secondary | ICD-10-CM

## 2022-11-12 DIAGNOSIS — I10 Essential (primary) hypertension: Secondary | ICD-10-CM

## 2022-11-12 DIAGNOSIS — K5731 Diverticulosis of large intestine without perforation or abscess with bleeding: Secondary | ICD-10-CM

## 2022-11-12 DIAGNOSIS — K5730 Diverticulosis of large intestine without perforation or abscess without bleeding: Secondary | ICD-10-CM

## 2022-11-12 DIAGNOSIS — Z87891 Personal history of nicotine dependence: Secondary | ICD-10-CM

## 2022-11-12 DIAGNOSIS — D62 Acute posthemorrhagic anemia: Secondary | ICD-10-CM | POA: Diagnosis not present

## 2022-11-12 DIAGNOSIS — K921 Melena: Secondary | ICD-10-CM | POA: Diagnosis not present

## 2022-11-12 HISTORY — PX: COLONOSCOPY WITH PROPOFOL: SHX5780

## 2022-11-12 HISTORY — PX: HOT HEMOSTASIS: SHX5433

## 2022-11-12 LAB — CBC WITH DIFFERENTIAL/PLATELET
Abs Immature Granulocytes: 0.02 K/uL (ref 0.00–0.07)
Basophils Absolute: 0 K/uL (ref 0.0–0.1)
Basophils Relative: 0 %
Eosinophils Absolute: 0.2 K/uL (ref 0.0–0.5)
Eosinophils Relative: 4 %
HCT: 21.1 % — ABNORMAL LOW (ref 39.0–52.0)
Hemoglobin: 6.9 g/dL — CL (ref 13.0–17.0)
Immature Granulocytes: 0 %
Lymphocytes Relative: 18 %
Lymphs Abs: 1.2 K/uL (ref 0.7–4.0)
MCH: 29.7 pg (ref 26.0–34.0)
MCHC: 32.7 g/dL (ref 30.0–36.0)
MCV: 90.9 fL (ref 80.0–100.0)
Monocytes Absolute: 0.7 K/uL (ref 0.1–1.0)
Monocytes Relative: 11 %
Neutro Abs: 4.4 K/uL (ref 1.7–7.7)
Neutrophils Relative %: 67 %
Platelets: 144 K/uL — ABNORMAL LOW (ref 150–400)
RBC: 2.32 MIL/uL — ABNORMAL LOW (ref 4.22–5.81)
RDW: 15.1 % (ref 11.5–15.5)
WBC: 6.6 K/uL (ref 4.0–10.5)
nRBC: 0 % (ref 0.0–0.2)

## 2022-11-12 LAB — ECHOCARDIOGRAM COMPLETE
AR max vel: 1.03 cm2
AV Area VTI: 0.87 cm2
AV Area mean vel: 0.91 cm2
AV Mean grad: 51 mmHg
AV Peak grad: 83.2 mmHg
Ao pk vel: 4.56 m/s
Area-P 1/2: 2.95 cm2
Height: 69 in
S' Lateral: 3.5 cm
Weight: 2529.12 oz

## 2022-11-12 LAB — BASIC METABOLIC PANEL WITH GFR
Anion gap: 8 (ref 5–15)
BUN: 59 mg/dL — ABNORMAL HIGH (ref 8–23)
CO2: 18 mmol/L — ABNORMAL LOW (ref 22–32)
Calcium: 7.8 mg/dL — ABNORMAL LOW (ref 8.9–10.3)
Chloride: 115 mmol/L — ABNORMAL HIGH (ref 98–111)
Creatinine, Ser: 1.93 mg/dL — ABNORMAL HIGH (ref 0.61–1.24)
GFR, Estimated: 34 mL/min — ABNORMAL LOW
Glucose, Bld: 137 mg/dL — ABNORMAL HIGH (ref 70–99)
Potassium: 4.7 mmol/L (ref 3.5–5.1)
Sodium: 141 mmol/L (ref 135–145)

## 2022-11-12 LAB — PREPARE RBC (CROSSMATCH)

## 2022-11-12 LAB — MAGNESIUM: Magnesium: 2.1 mg/dL (ref 1.7–2.4)

## 2022-11-12 LAB — HEMOGLOBIN AND HEMATOCRIT, BLOOD
HCT: 21.1 % — ABNORMAL LOW (ref 39.0–52.0)
HCT: 22 % — ABNORMAL LOW (ref 39.0–52.0)
Hemoglobin: 7.3 g/dL — ABNORMAL LOW (ref 13.0–17.0)
Hemoglobin: 7.4 g/dL — ABNORMAL LOW (ref 13.0–17.0)

## 2022-11-12 LAB — TYPE AND SCREEN
ABO/RH(D): O POS
Antibody Screen: NEGATIVE
Unit division: 0
Unit division: 0

## 2022-11-12 LAB — BPAM RBC
Blood Product Expiration Date: 202404182359
ISSUE DATE / TIME: 202404100003
ISSUE DATE / TIME: 202404110433
Unit Type and Rh: 5100
Unit Type and Rh: 5100

## 2022-11-12 LAB — GLUCOSE, CAPILLARY
Glucose-Capillary: 103 mg/dL — ABNORMAL HIGH (ref 70–99)
Glucose-Capillary: 161 mg/dL — ABNORMAL HIGH (ref 70–99)
Glucose-Capillary: 79 mg/dL (ref 70–99)

## 2022-11-12 SURGERY — COLONOSCOPY WITH PROPOFOL
Anesthesia: Monitor Anesthesia Care

## 2022-11-12 MED ORDER — PROPOFOL 500 MG/50ML IV EMUL
INTRAVENOUS | Status: DC | PRN
  Administered 2022-11-12: 100 ug/kg/min via INTRAVENOUS

## 2022-11-12 MED ORDER — SODIUM CHLORIDE 0.9% IV SOLUTION: Freq: Once | INTRAVENOUS | Status: AC

## 2022-11-12 MED ORDER — ORAL CARE MOUTH RINSE: 15.0000 mL | OROMUCOSAL | Status: DC | PRN

## 2022-11-12 MED ORDER — PHENYLEPHRINE 80 MCG/ML (10ML) SYRINGE FOR IV PUSH (FOR BLOOD PRESSURE SUPPORT)
PREFILLED_SYRINGE | INTRAVENOUS | Status: DC | PRN
  Administered 2022-11-12: 160 ug via INTRAVENOUS
  Administered 2022-11-12 (×2): 80 ug via INTRAVENOUS

## 2022-11-12 MED ORDER — LIDOCAINE 2% (20 MG/ML) 5 ML SYRINGE
INTRAMUSCULAR | Status: DC | PRN
  Administered 2022-11-12: 40 mg via INTRAVENOUS

## 2022-11-12 MED ORDER — LACTATED RINGERS IV SOLN: INTRAVENOUS | Status: DC

## 2022-11-12 MED ORDER — SODIUM CHLORIDE 0.9% IV SOLUTION
Freq: Once | INTRAVENOUS | Status: AC
  Administered 2022-11-12: 10 mL/h via INTRAVENOUS

## 2022-11-12 MED ORDER — SODIUM CHLORIDE 0.9 % IV SOLN: INTRAVENOUS | Status: DC

## 2022-11-12 SURGICAL SUPPLY — 22 items

## 2022-11-12 NOTE — Progress Notes (Signed)
     Patient Name: [name]           DOB: 06-03-1941  MRN: [id number]      Admission Date: 11/10/2022  Attending Provider: Dorcas Carrow, MD  Primary Diagnosis: GI bleed   Level of care: Progressive   OVERNIGHT PROGRESS REPORT  Transferred patient to progressive unit for closer monitoring related to GI bleed, hemoglobin 5.4, and blood transfusions.  Patient remains hemodynamically stable, alert and oriented.   GI is planning colonoscopy tomorrow.  Patient tolerating colonoscopy prep.   Anthoney Harada, DNP, ACNPC- AG [facility]

## 2022-11-12 NOTE — Transfer of Care (Signed)
Immediate Anesthesia Transfer of Care Note  Patient: [name]  Procedure(s) Performed: COLONOSCOPY WITH PROPOFOL HOT HEMOSTASIS (ARGON PLASMA COAGULATION/BICAP)  Patient Location: Endoscopy Unit  Anesthesia Type:MAC  Level of Consciousness: sedated  Airway & Oxygen Therapy: Patient Spontanous Breathing and Patient connected to face mask oxygen  Post-op Assessment: Report given to RN and Post -op Vital signs reviewed and stable  Post vital signs: Reviewed and stable  Last Vitals:  Vitals Value Taken Time  BP 129/32 11/12/22 1148  Temp    Pulse 61 11/12/22 1149  Resp 15 11/12/22 1150  SpO2 100 % 11/12/22 1149  Vitals shown include unvalidated device data.  Last Pain:  Vitals:   11/12/22 1015  TempSrc: Oral  PainSc: 0-No pain         Complications: No notable events documented.

## 2022-11-12 NOTE — Progress Notes (Signed)
Echocardiogram 2D Echocardiogram has been performed.  [name] 11/12/2022, 3:01 PM

## 2022-11-12 NOTE — Progress Notes (Signed)
Attempted Echocardiogram, patient went to Endoscopy, will try again later

## 2022-11-12 NOTE — Anesthesia Preprocedure Evaluation (Addendum)
Anesthesia Evaluation  Patient identified by MRN, date of birth, ID band Patient awake    Reviewed: Allergy & Precautions, NPO status , Patient's Chart, lab work & pertinent test results  History of Anesthesia Complications Negative for: history of anesthetic complications  Airway Mallampati: II  TM Distance: >3 FB Neck ROM: Full    Dental  (+) Poor Dentition, Missing,    Pulmonary former smoker   Pulmonary exam normal        Cardiovascular hypertension, Pt. on medications + Peripheral Vascular Disease  Normal cardiovascular exam     Neuro/Psych    Depression    negative neurological ROS     GI/Hepatic Neg liver ROS,,,hematochezia   Endo/Other  negative endocrine ROS    Renal/GU Renal InsufficiencyRenal disease  negative genitourinary   Musculoskeletal negative musculoskeletal ROS (+)    Abdominal   Peds  Hematology  (+) Blood dyscrasia (Hgb 6.9), anemia   Anesthesia Other Findings Day of surgery medications reviewed with patient.  Reproductive/Obstetrics negative OB ROS                              Anesthesia Physical Anesthesia Plan  ASA: 3 and emergent  Anesthesia Plan: MAC   Post-op Pain Management: Minimal or no pain anticipated   Induction:   PONV Risk Score and Plan: 1 and Treatment may vary due to age or medical condition and Propofol infusion  Airway Management Planned: Natural Airway and Simple Face Mask  Additional Equipment: None  Intra-op Plan:   Post-operative Plan:   Informed Consent: I have reviewed the patients History and Physical, chart, labs and discussed the procedure including the risks, benefits and alternatives for the proposed anesthesia with the patient or authorized representative who has indicated his/her understanding and acceptance.       Plan Discussed with: CRNA  Anesthesia Plan Comments:         Anesthesia Quick  Evaluation

## 2022-11-12 NOTE — Anesthesia Procedure Notes (Signed)
Procedure Name: MAC Date/Time: 11/12/2022 10:55 AM  Performed by: Jodell Cipro, CRNAPre-anesthesia Checklist: Patient identified, Emergency Drugs available, Suction available and Patient being monitored Patient Re-evaluated:Patient Re-evaluated prior to induction Oxygen Delivery Method: Simple face mask Placement Confirmation: positive ETCO2 Dental Injury: Teeth and Oropharynx as per pre-operative assessment

## 2022-11-12 NOTE — Progress Notes (Signed)
PROGRESS NOTE    RAZ VANDIEST  VZD:[id number] DOB: [birth year] DOA: 11/10/2022 PCP: Frederica Kuster, MD    Brief Narrative:  82 year old gentleman with history of BPH, hypertension, hyperlipidemia, type 2 diabetes, heart failure with preserved ejection fraction, CKD stage IIIb presented to the emergency room with about 5 days of intermittent bloody stool.  In the emergency room blood pressures stable.  Hemoglobin 5.6 with recent hemoglobin of 11 last year.  No history of colonoscopy.  Creatinine 2.39 at about baseline.  Blood transfusion was started in the ER.  Admitted with GI consultation.   Assessment & Plan:   Acute lower GI bleeding, suspected diverticular bleeding. Anemia of acute blood loss:  Presentation hemoglobin 5.6 -3 units of PRBC transfusion -6.9.   Further 1 unit today, recheck after transfusion.  He may need further PRBC.   Continue maintenance IV fluids.  Will transfuse if less than 8. Underwent colonoscopy today Blood in the entire examined colon, diverticulosis in the sigmoid colon and descending colon.  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  Internal hemorrhoids. Active lesion was cauterized.  Hopefully he will stop bleeding.  AKI on CKD stage IIIb: Likely prerenal in the setting of GI bleed.  On maintenance IV fluids.  Repeat levels stabilized to his baseline.  Troponin elevation: Likely demand ischemia.  No evidence of acute coronary syndrome.  Hyperkalemia: In the setting of using losartan.  Treated with iv fluids. Potassium is normalized.   Essential hypertension: Blood pressure medications on hold.  As needed hydralazine.  Chronic heart failure with preserved ejection fraction: Lasix on hold.  Euvolemic.  Aortic stenosis by history: Admitting physician ordered echocardiogram.  Will follow-up.    DVT prophylaxis: SCDs Start: 11/11/22 1326SCDs   Code Status: Full code Family Communication: Daughter on the phone.   Disposition Plan: Status is: Inpatient Remains inpatient appropriate because: Active GI bleeding, inpatient procedures planned     Consultants:  Gastroenterology  Procedures:  None  Antimicrobials:  None   Subjective:  Patient seen and examined. Came back from procedure. Overnight bowel prep with blood mixed stool.  Colonoscopy findings discussed. He was given additional PRBC overnight.  Objective: Vitals:   11/12/22 1220 11/12/22 1222 11/12/22 1245 11/12/22 1300  BP:  (!) 135/48 (!) 143/51   Pulse:  73 70 68  Resp: (!) 25 13 19 20   Temp:      TempSrc:      SpO2:  97% 99% 100%  Weight:      Height:        Intake/Output Summary (Last 24 hours) at 11/12/2022 1312 Last data filed at 11/12/2022 1245 Gross per 24 hour  Intake 1760.75 ml  Output --  Net 1760.75 ml   Filed Weights   11/10/22 2053 11/12/22 1015  Weight: 71.7 kg 71.7 kg    Examination:  General exam: Appears calm and comfortable.  Respiratory system: Clear to auscultation. Respiratory effort normal. Cardiovascular system: S1 & S2 heard, RRR. No pedal edema. Gastrointestinal system: Soft.  Nontender.  Bowel sound present.   Central nervous system: Alert and oriented. No focal neurological deficits. Extremities: Symmetric 5 x 5 power. Skin: No rashes, lesions or ulcers Psychiatry: Judgement and insight appear normal. Mood & affect appropriate.     Data Reviewed: I have personally reviewed following labs and imaging studies  CBC: Recent Labs  Lab 11/10/22 2138 11/11/22 1350 11/12/22 0113  WBC 8.5 8.0 6.6  NEUTROABS 6.4  --  4.4  HGB 5.6* 5.4*  6.9*  HCT 18.4* 16.6* 21.1*  MCV 95.8 91.7 90.9  PLT 236 184 144*   Basic Metabolic Panel: Recent Labs  Lab 11/10/22 2138 11/11/22 1350 11/12/22 0113  NA 141 141 141  K 5.5* 5.1 4.7  CL 109 114* 115*  CO2 18* 18* 18*  GLUCOSE 187* 128* 137*  BUN 68* 68* 59*  CREATININE 2.39* 2.15* 1.93*  CALCIUM 9.2 8.5* 7.8*  MG  --   --  2.1    GFR: Estimated Creatinine Clearance: 30 mL/min (A) (by C-G formula based on SCr of 1.93 mg/dL (H)). Liver Function Tests: Recent Labs  Lab 11/10/22 2138  AST 28  ALT 26  ALKPHOS 46  BILITOT 0.2*  PROT 6.3*  ALBUMIN 3.1*   No results for input(s): "LIPASE", "AMYLASE" in the last 168 hours. No results for input(s): "AMMONIA" in the last 168 hours. Coagulation Profile: Recent Labs  Lab 11/10/22 2250  INR 1.2   Cardiac Enzymes: No results for input(s): "CKTOTAL", "CKMB", "CKMBINDEX", "TROPONINI" in the last 168 hours. BNP (last 3 results) No results for input(s): "PROBNP" in the last 8760 hours. HbA1C: No results for input(s): "HGBA1C" in the last 72 hours. CBG: Recent Labs  Lab 11/11/22 1745 11/11/22 1940 11/12/22 1259  GLUCAP 127* 126* 103*   Lipid Profile: No results for input(s): "CHOL", "HDL", "LDLCALC", "TRIG", "CHOLHDL", "LDLDIRECT" in the last 72 hours. Thyroid Function Tests: No results for input(s): "TSH", "T4TOTAL", "FREET4", "T3FREE", "THYROIDAB" in the last 72 hours. Anemia Panel: No results for input(s): "VITAMINB12", "FOLATE", "FERRITIN", "TIBC", "IRON", "RETICCTPCT" in the last 72 hours. Sepsis Labs: No results for input(s): "PROCALCITON", "LATICACIDVEN" in the last 168 hours.  Recent Results (from the past 240 hour(s))  Resp panel by RT-PCR (RSV, Flu A&B, Covid) Anterior Nasal Swab     Status: None   Collection Time: 11/10/22  1:03 AM   Specimen: Anterior Nasal Swab  Result Value Ref Range Status   SARS Coronavirus 2 by RT PCR NEGATIVE NEGATIVE Final   Influenza A by PCR NEGATIVE NEGATIVE Final   Influenza B by PCR NEGATIVE NEGATIVE Final    Comment: (NOTE) The Xpert Xpress SARS-CoV-2/FLU/RSV plus assay is intended as an aid in the diagnosis of influenza from Nasopharyngeal swab specimens and should not be used as a sole basis for treatment. Nasal washings and aspirates are unacceptable for Xpert Xpress SARS-CoV-2/FLU/RSV testing.  Fact  Sheet for Patients: BloggerCourse.comhttps://www.fda.gov/media/152166/download  Fact Sheet for Healthcare Providers: SeriousBroker.ithttps://www.fda.gov/media/152162/download  This test is not yet approved or cleared by the [place] FDA and has been authorized for detection and/or diagnosis of SARS-CoV-2 by FDA under an Emergency Use Authorization (EUA). This EUA will remain in effect (meaning this test can be used) for the duration of the COVID-19 declaration under Section 564(b)(1) of the Act, 21 U.S.C. section 360bbb-3(b)(1), unless the authorization is terminated or revoked.     Resp Syncytial Virus by PCR NEGATIVE NEGATIVE Final    Comment: (NOTE) Fact Sheet for Patients: BloggerCourse.comhttps://www.fda.gov/media/152166/download  Fact Sheet for Healthcare Providers: SeriousBroker.ithttps://www.fda.gov/media/152162/download  This test is not yet approved or cleared by the [place] FDA and has been authorized for detection and/or diagnosis of SARS-CoV-2 by FDA under an Emergency Use Authorization (EUA). This EUA will remain in effect (meaning this test can be used) for the duration of the COVID-19 declaration under Section 564(b)(1) of the Act, 21 U.S.C. section 360bbb-3(b)(1), unless the authorization is terminated or revoked.  Performed at [facility], [address] [place], [place] [address]  Radiology Studies: CT ABDOMEN PELVIS WO CONTRAST  Result Date: 11/11/2022 CLINICAL DATA:  Abdominal pain, vomiting EXAM: CT ABDOMEN AND PELVIS WITHOUT CONTRAST TECHNIQUE: Multidetector CT imaging of the abdomen and pelvis was performed following the standard protocol without IV contrast. RADIATION DOSE REDUCTION: This exam was performed according to the departmental dose-optimization program which includes automated exposure control, adjustment of the mA and/or kV according to patient size and/or use of iterative reconstruction technique. COMPARISON:  None Available. FINDINGS: Lower chest: No acute abnormality  Hepatobiliary: No focal hepatic abnormality. Gallbladder unremarkable. Pancreas: No focal abnormality or ductal dilatation. Spleen: No focal abnormality.  Normal size. Adrenals/Urinary Tract: No adrenal abnormality. No focal renal abnormality. No stones or hydronephrosis. Urinary bladder is unremarkable. Stomach/Bowel: Diffuse colonic diverticulosis. No active diverticulitis. Stomach and small bowel grossly unremarkable. No bowel obstruction. Vascular/Lymphatic: Diffuse aortoiliac atherosclerosis. No evidence of aneurysm or adenopathy. Reproductive: Markedly enlarged prostate with a transverse diameter of 6.4 cm. Other: No free fluid or free air. Musculoskeletal: No acute bony abnormality. IMPRESSION: Diffuse colonic diverticulosis.  No active diverticulitis. No evidence of bowel obstruction. Aortoiliac atherosclerosis. Prostate enlargement. Electronically Signed   By: Charlett Nose M.D.   On: 11/11/2022 03:33   DG Chest Portable 1 View  Result Date: 11/10/2022 CLINICAL DATA:  Shortness of breath EXAM: PORTABLE CHEST 1 VIEW COMPARISON:  11/29/2019 FINDINGS: No acute airspace disease. Stable cardiomediastinal silhouette with aortic atherosclerosis. No pneumothorax. IMPRESSION: No active disease. Electronically Signed   By: Jasmine Pang M.D.   On: 11/10/2022 22:14        Scheduled Meds:  insulin aspart  0-5 Units Subcutaneous QHS   insulin aspart  0-6 Units Subcutaneous TID WC   latanoprost  1 drop Both Eyes QHS   rosuvastatin  40 mg Oral Daily   timolol  1 drop Both Eyes Daily   Continuous Infusions:     LOS: 2 days    Time spent: 35 minutes    Dorcas Carrow, MD [facility] Pager [phone]

## 2022-11-12 NOTE — Op Note (Signed)
[facility] Patient Name: [name] Procedure Date : 11/12/2022 MRN: [id number] Attending MD: Willaim Rayas. Adela Lank , MD, [id number] Date of Birth: 1941/02/16 CSN: [id number] Age: 82 Admit Type: Inpatient Procedure:                Colonoscopy Indications:              Hematochezia - suspected lower GI bleed, no prior                            colonoscopy. CTA negative, diverticulosis noted. Providers:                Willaim Rayas. Adela Lank, MD, Delton Prairie, RN,                            Kandice Robinsons, Technician Referring MD:              Medicines:                Monitored Anesthesia Care Complications:            No immediate complications. Estimated blood loss:                            Minimal. Estimated Blood Loss:     Estimated blood loss was minimal. Procedure:                Pre-Anesthesia Assessment:                           - Prior to the procedure, a History and Physical                            was performed, and patient medications and                            allergies were reviewed. The patient's tolerance of                            previous anesthesia was also reviewed. The risks                            and benefits of the procedure and the sedation                            options and risks were discussed with the patient.                            All questions were answered, and informed consent                            was obtained. Prior Anticoagulants: The patient has                            taken no anticoagulant or antiplatelet agents. ASA  Grade Assessment: III - A patient with severe                            systemic disease. After reviewing the risks and                            benefits, the patient was deemed in satisfactory                            condition to undergo the procedure.                           After obtaining informed consent, the colonoscope                             was passed under direct vision. Throughout the                            procedure, the patient's blood pressure, pulse, and                            oxygen saturations were monitored continuously. The                            PCF-190TL ([id number]) Olympus colonoscope was                            introduced through the anus and advanced to the the                            cecum, identified by appendiceal orifice and                            ileocecal valve. The colonoscopy was performed                            without difficulty. The patient tolerated the                            procedure well. The quality of the bowel                            preparation was fair. The ileocecal valve,                            appendiceal orifice, and rectum were photographed. Scope In: 11:09:06 AM Scope Out: 11:43:40 AM Scope Withdrawal Time: 0 hours 16 minutes 52 seconds  Total Procedure Duration: 0 hours 34 minutes 34 seconds  Findings:      The perianal and digital rectal examinations were normal.      Red and maroon blood and clot was found in the entire colon which       required extensive lavage.      Multiple medium-mouthed diverticula were found in the sigmoid colon,       descending colon,  transverse colon and ascending colon. No active       bleeding noted in any of these areas.      A single medium-sized angiodysplastic lesion with active bleeding was       found at the ileocecal valve with fresh blood in the area. Fulguration       to stop the bleeding by argon plasma was successful.      A 15 mm polyp was found in the cecum, near the AO but not invading it.       The polyp was sessile. It was not removed given recent bleeding.      Internal hemorrhoids were found during retroflexion. The hemorrhoids       were moderate.      The exam was otherwise without abnormality of what was visualized. No       other polyps seen but due to prep very small or flat polyps may not have        been appreciated. Impression:               - Preparation of the colon was fair due to recent                            bleeding.                           - Blood in the entire examined colon.                           - Diverticulosis in the sigmoid colon, in the                            descending colon, in the transverse colon and in                            the ascending colon.                           - A single actively bleeding colonic                            angiodysplastic lesion. Treated with argon plasma                            coagulation (APC).                           - One 15 mm polyp in the cecum - not removed.                           - Internal hemorrhoids.                           - The examination was otherwise normal. Recommendation:           - Return patient to hospital ward for ongoing care.                           - Clear liquid diet okay  for now                           - Continue present medications.                           - Repeat Hgb, monitor response to transfusion and                            for recurrent bleeding. Additional PRBC if needed                           - Will need outpatient follow up with consideration                            for repeat colonoscopy to remove cecal polyp in                            upcoming months if he is willing                           - We will reassess him in the AM, call with                            questions or recurrent bleeding in the interim. Procedure Code(s):        --- Professional ---                           [phone], Colonoscopy, flexible; with control of                            bleeding, any method Diagnosis Code(s):        --- Professional ---                           K64.8, Other hemorrhoids                           K92.2, Gastrointestinal hemorrhage, unspecified                           K55.21, Angiodysplasia of colon with hemorrhage                            D12.0, Benign neoplasm of cecum                           K92.1, Melena (includes Hematochezia)                           K57.30, Diverticulosis of large intestine without                            perforation or abscess without bleeding CPT copyright 2022 American Medical Association. All rights reserved. The codes documented in this report are preliminary and upon coder review may  be revised  to meet current compliance requirements. Viviann Spare P. Cadin Luka, MD 11/12/2022 12:06:00 PM This report has been signed electronically. Number of Addenda: 0

## 2022-11-12 NOTE — Anesthesia Postprocedure Evaluation (Signed)
Anesthesia Post Note  Patient: [name]  Procedure(s) Performed: COLONOSCOPY WITH PROPOFOL HOT HEMOSTASIS (ARGON PLASMA COAGULATION/BICAP)     Patient location during evaluation: PACU Anesthesia Type: MAC Level of consciousness: awake and alert Pain management: pain level controlled Vital Signs Assessment: post-procedure vital signs reviewed and stable Respiratory status: spontaneous breathing, nonlabored ventilation and respiratory function stable Cardiovascular status: blood pressure returned to baseline Postop Assessment: no apparent nausea or vomiting Anesthetic complications: no   No notable events documented.  Last Vitals:  Vitals:   11/12/22 1220 11/12/22 1222  BP:  (!) 135/48  Pulse:  73  Resp: (!) 25 13  Temp:    SpO2:  97%    Last Pain:  Vitals:   11/12/22 1205  TempSrc:   PainSc: 0-No pain                 Shanda Howells

## 2022-11-12 NOTE — Progress Notes (Signed)
Pt is transferred to [id number] safely.

## 2022-11-12 NOTE — Interval H&P Note (Signed)
History and Physical Interval Note: Patient had additional bleeding with bowel prep overnight - bright red blood. Hgb remained in 6s and given additional PRBC overnight. He denies any pain. Hemodynamics stable.   He wishes to proceed with colonoscopy following discussion of risks / benefits. I have discussed that this may find the likely source of his symptoms - diverticular bleeding, but rule out polyps, etc, as he has never had a prior colonoscopy. If the colonoscopy does not show a clear source for his symptoms, EGD is recommended. This was recommended to him yesterday and he declined. I again recommended an upper endoscopy to him while he is sedated today to rule out upper tract bleeding given BUN is elevated. We could do colonoscopy first and then EGD if needed pending findings if needed. Again following discussion of this, risks / benefits, he is adamantly opposed to an upper endoscopy today. He understands if he declines EGD without a clear source on colonoscopy we may not be able to find a cause for his bleeding which could otherwise persist, and further prolong his hospitalization. Again he declines endoscopy. Hopefully we find a source on his colonoscopy. Further recommendations pending the results.    11/12/2022 10:52 AM  [name]  has presented today for surgery, with the diagnosis of hematochezia.  The various methods of treatment have been discussed with the patient and family. After consideration of risks, benefits and other options for treatment, the patient has consented to  Procedure(s): COLONOSCOPY WITH PROPOFOL (N/A) as a surgical intervention.  The patient's history has been reviewed, patient examined, no change in status, stable for surgery.  I have reviewed the patient's chart and labs.  Questions were answered to the patient's satisfaction.     Viviann Spare P Javana Schey

## 2022-11-12 NOTE — Progress Notes (Signed)
RN noticed bloody stool when the pt having bowel movement on bedside commode. Pt alert oriented and ongoing 2nd unit of Blood transfusion. Pt stated that he had 4x BM started from my shift. On call provider notified. Pt will transfer to 5W for close monitoring.

## 2022-11-13 ENCOUNTER — Telehealth: Payer: Self-pay

## 2022-11-13 DIAGNOSIS — D62 Acute posthemorrhagic anemia: Secondary | ICD-10-CM | POA: Diagnosis not present

## 2022-11-13 DIAGNOSIS — D649 Anemia, unspecified: Secondary | ICD-10-CM | POA: Diagnosis not present

## 2022-11-13 DIAGNOSIS — D120 Benign neoplasm of cecum: Secondary | ICD-10-CM

## 2022-11-13 DIAGNOSIS — N179 Acute kidney failure, unspecified: Secondary | ICD-10-CM | POA: Diagnosis not present

## 2022-11-13 DIAGNOSIS — N183 Chronic kidney disease, stage 3 unspecified: Secondary | ICD-10-CM | POA: Diagnosis not present

## 2022-11-13 DIAGNOSIS — K921 Melena: Secondary | ICD-10-CM | POA: Diagnosis not present

## 2022-11-13 LAB — TYPE AND SCREEN
Unit division: 0
Unit division: 0
Unit division: 0

## 2022-11-13 LAB — BASIC METABOLIC PANEL
Anion gap: 6 (ref 5–15)
BUN: 30 mg/dL — ABNORMAL HIGH (ref 8–23)
CO2: 21 mmol/L — ABNORMAL LOW (ref 22–32)
Calcium: 7.9 mg/dL — ABNORMAL LOW (ref 8.9–10.3)
Chloride: 112 mmol/L — ABNORMAL HIGH (ref 98–111)
Creatinine, Ser: 1.46 mg/dL — ABNORMAL HIGH (ref 0.61–1.24)
GFR, Estimated: 48 mL/min — ABNORMAL LOW (ref >60)
Glucose, Bld: 93 mg/dL (ref 70–99)
Potassium: 4.3 mmol/L (ref 3.5–5.1)
Sodium: 139 mmol/L (ref 135–145)

## 2022-11-13 LAB — CBC
HCT: 22.2 % — ABNORMAL LOW (ref 39.0–52.0)
Hemoglobin: 7.2 g/dL — ABNORMAL LOW (ref 13.0–17.0)
MCH: 29.4 pg (ref 26.0–34.0)
MCHC: 32.4 g/dL (ref 30.0–36.0)
MCV: 90.6 fL (ref 80.0–100.0)
Platelets: 132 10*3/uL — ABNORMAL LOW (ref 150–400)
RBC: 2.45 MIL/uL — ABNORMAL LOW (ref 4.22–5.81)
RDW: 16.9 % — ABNORMAL HIGH (ref 11.5–15.5)
WBC: 6.6 10*3/uL (ref 4.0–10.5)
nRBC: 0 % (ref 0.0–0.2)

## 2022-11-13 LAB — BPAM RBC
Blood Product Expiration Date: 202405082359
ISSUE DATE / TIME: 202404102047
ISSUE DATE / TIME: 202404111633
Unit Type and Rh: 5100
Unit Type and Rh: 5100
Unit Type and Rh: 5100

## 2022-11-13 LAB — GLUCOSE, CAPILLARY
Glucose-Capillary: 81 mg/dL (ref 70–99)
Glucose-Capillary: 106 mg/dL — ABNORMAL HIGH (ref 70–99)
Glucose-Capillary: 156 mg/dL — ABNORMAL HIGH (ref 70–99)

## 2022-11-13 MED ORDER — SODIUM CHLORIDE 0.9 % IV SOLN
250.0000 mg | Freq: Every day | INTRAVENOUS | Status: AC
  Administered 2022-11-13 – 2022-11-14 (×2): 250 mg via INTRAVENOUS
  Filled 2022-11-13 (×2): qty 20

## 2022-11-13 NOTE — Progress Notes (Signed)
PROGRESS NOTE    EMMITTE SURGEON  JXB:[id number] DOB: 08/08/1940 DOA: 11/10/2022 PCP: Frederica Kuster, MD    Brief Narrative:  82 year old gentleman with history of BPH, hypertension, hyperlipidemia, type 2 diabetes, heart failure with preserved ejection fraction, CKD stage IIIb presented to the emergency room with about 5 days of intermittent bloody stool.  In the emergency room blood pressures stable.  Hemoglobin 5.6 with recent hemoglobin of 11 last year.  No history of colonoscopy.  Creatinine 2.39. Blood transfusion was started in the ER.  Admitted with GI consultation. Patient was resuscitated and underwent colonoscopy and found to have bleeding angiodysplastic lesion.  Patient received total 5 units of PRBC.    Assessment & Plan:   Acute lower GI bleeding, suspected diverticular bleeding. Anemia of acute blood loss:  Presentation hemoglobin 5.6, received total 5 units of PRBC with some heart destabilization now.  Hemoglobin remains 7.2 without further drop overnight. Recheck hemoglobin tomorrow.  While he is in the hospital, will transfuse IV iron. Advance to regular diet, wait for bowel movements to ensure no further bleeding. Colonoscopy with Blood in the entire examined colon, diverticulosis in the sigmoid colon and descending colon.  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  Internal hemorrhoids. Active lesion was cauterized.  Hopefully he will stop bleeding.  AKI on CKD stage IIIb: Prerenal.  Improved.  Troponin elevation: Likely demand ischemia.  No evidence of acute coronary syndrome.  Hyperkalemia: In the setting of using losartan.  Treated with iv fluids. Potassium is normalized.   Essential hypertension: Blood pressure medications on hold.  As needed hydralazine.  Chronic heart failure with preserved ejection fraction: Lasix on hold.  Euvolemic.  Aortic stenosis by history: Admitting physician ordered echocardiogram.  Shows severe  aortic stenosis.  Followed by Dr. Jacinto Halim.  Advance diet.  Mobilize in the hallway.  Can come out of the telemetry monitor.  Wait for bowel movement to ensure no further bleeding.  Anticipate home tomorrow.    DVT prophylaxis: SCDs Start: 11/11/22 1326SCDs   Code Status: Full code Family Communication: Daughter at the bedside 4/11. Disposition Plan: Status is: Inpatient Remains inpatient appropriate because: Anemia.     Consultants:  Gastroenterology  Procedures:  None  Antimicrobials:  None   Subjective:  Patient seen and examined.  Denies any complaints.  Denies any nausea vomiting.  He was happy to be eating regular diet.  No bowel movement since procedure yesterday.  Hemoglobin 7.2.  Objective: Vitals:   11/13/22 0016 11/13/22 0747 11/13/22 0800 11/13/22 1133  BP: (!) 124/54 (!) 116/93  (!) 125/52  Pulse: 74 70  66  Resp: Temp: 98.4 F (36.9 C)  98.1 F (36.7 C)   TempSrc: Oral  Oral   SpO2:  100% 97% 100%  Weight:      Height:        Intake/Output Summary (Last 24 hours) at 11/13/2022 1158 Last data filed at 11/13/2022 0939 Gross per 24 hour  Intake 1035 ml  Output 775 ml  Net 260 ml    Filed Weights   11/10/22 2053 11/12/22 1015  Weight: 71.7 kg 71.7 kg    Examination:  General exam: Appears calm and comfortable.  Respiratory system: Clear to auscultation. Respiratory effort normal. Cardiovascular system: S1 & S2 heard, RRR. No pedal edema. Gastrointestinal system: Soft.  Nontender.  Bowel sound present.   Central nervous system: Alert and oriented. No focal neurological deficits. Extremities: Symmetric 5 x 5 power.  Skin: No rashes, lesions or ulcers Psychiatry: Judgement and insight appear normal. Mood & affect appropriate.     Data Reviewed: I have personally reviewed following labs and imaging studies  CBC: Recent Labs  Lab 11/10/22 2138 11/11/22 1350 11/12/22 0113 11/12/22 1345 11/12/22 1902 11/13/22 0534  WBC 8.5 8.0  6.6  --   --  6.6  NEUTROABS 6.4  --  4.4  --   --   --   HGB 5.6* 5.4* 6.9* 7.3* 7.4* 7.2*  HCT 18.4* 16.6* 21.1* 21.1* 22.0* 22.2*  MCV 95.8 91.7 90.9  --   --  90.6  PLT 236 184 144*  --   --  132*    Basic Metabolic Panel: Recent Labs  Lab 11/10/22 2138 11/11/22 1350 11/12/22 0113 11/13/22 0534  NA 141 141 141 139  K 5.5* 5.1 4.7 4.3  CL 109 114* 115* 112*  CO2 18* 18* 18* 21*  GLUCOSE 187* 128* 137* 93  BUN 68* 68* 59* 30*  CREATININE 2.39* 2.15* 1.93* 1.46*  CALCIUM 9.2 8.5* 7.8* 7.9*  MG  --   --  2.1  --     GFR: Estimated Creatinine Clearance: 39.7 mL/min (A) (by C-G formula based on SCr of 1.46 mg/dL (H)). Liver Function Tests: Recent Labs  Lab 11/10/22 2138  AST 28  ALT 26  ALKPHOS 46  BILITOT 0.2*  PROT 6.3*  ALBUMIN 3.1*    No results for input(s): "LIPASE", "AMYLASE" in the last 168 hours. No results for input(s): "AMMONIA" in the last 168 hours. Coagulation Profile: Recent Labs  Lab 11/10/22 2250  INR 1.2    Cardiac Enzymes: No results for input(s): "CKTOTAL", "CKMB", "CKMBINDEX", "TROPONINI" in the last 168 hours. BNP (last 3 results) No results for input(s): "PROBNP" in the last 8760 hours. HbA1C: No results for input(s): "HGBA1C" in the last 72 hours. CBG: Recent Labs  Lab 11/12/22 1259 11/12/22 1645 11/12/22 2159 11/13/22 0746 11/13/22 1144  GLUCAP 103* 161* 79 81 106*    Lipid Profile: No results for input(s): "CHOL", "HDL", "LDLCALC", "TRIG", "CHOLHDL", "LDLDIRECT" in the last 72 hours. Thyroid Function Tests: No results for input(s): "TSH", "T4TOTAL", "FREET4", "T3FREE", "THYROIDAB" in the last 72 hours. Anemia Panel: No results for input(s): "VITAMINB12", "FOLATE", "FERRITIN", "TIBC", "IRON", "RETICCTPCT" in the last 72 hours. Sepsis Labs: No results for input(s): "PROCALCITON", "LATICACIDVEN" in the last 168 hours.  Recent Results (from the past 240 hour(s))  Resp panel by RT-PCR (RSV, Flu A&B, Covid) Anterior Nasal  Swab     Status: None   Collection Time: 11/10/22  1:03 AM   Specimen: Anterior Nasal Swab  Result Value Ref Range Status   SARS Coronavirus 2 by RT PCR NEGATIVE NEGATIVE Final   Influenza A by PCR NEGATIVE NEGATIVE Final   Influenza B by PCR NEGATIVE NEGATIVE Final    Comment: (NOTE) The Xpert Xpress SARS-CoV-2/FLU/RSV plus assay is intended as an aid in the diagnosis of influenza from Nasopharyngeal swab specimens and should not be used as a sole basis for treatment. Nasal washings and aspirates are unacceptable for Xpert Xpress SARS-CoV-2/FLU/RSV testing.  Fact Sheet for Patients: BloggerCourse.com  Fact Sheet for Healthcare Providers: SeriousBroker.it  This test is not yet approved or cleared by the [place] FDA and has been authorized for detection and/or diagnosis of SARS-CoV-2 by FDA under an Emergency Use Authorization (EUA). This EUA will remain in effect (meaning this test can be used) for the duration of the COVID-19 declaration under Section 564(b)(1)  of the Act, 21 U.S.C. section 360bbb-3(b)(1), unless the authorization is terminated or revoked.     Resp Syncytial Virus by PCR NEGATIVE NEGATIVE Final    Comment: (NOTE) Fact Sheet for Patients: BloggerCourse.com  Fact Sheet for Healthcare Providers: SeriousBroker.it  This test is not yet approved or cleared by the [place] FDA and has been authorized for detection and/or diagnosis of SARS-CoV-2 by FDA under an Emergency Use Authorization (EUA). This EUA will remain in effect (meaning this test can be used) for the duration of the COVID-19 declaration under Section 564(b)(1) of the Act, 21 U.S.C. section 360bbb-3(b)(1), unless the authorization is terminated or revoked.  Performed at [facility], [address], [place], [place] [address]          Radiology Studies: ECHOCARDIOGRAM  COMPLETE  Result Date: 11/12/2022    ECHOCARDIOGRAM REPORT   Patient Name:   [name] Date of Exam: 11/12/2022 Medical Rec #:  [id number]       Height:       69.0 in Accession #:    [id number]      Weight:       158.1 lb Date of Birth:  1940-11-26       BSA:          1.869 m Patient Age:    81 years        BP:           145/66 mmHg Patient Gender: M               HR:           65 bpm. Exam Location:  Inpatient Procedure: 2D Echo, Cardiac Doppler and Color Doppler Indications:    Elevated Troponin  History:        Patient has prior history of Echocardiogram examinations, most                 recent 06/01/2021. Risk Factors:Hypertension and Dyslipidemia.                 CKD, stage 3.  Sonographer:    Lucendia Herrlich Referring Phys: [id number] FANG XU IMPRESSIONS  1. Left ventricular ejection fraction, by estimation, is 60 to 65%. The left ventricle has normal function. The left ventricle has no regional wall motion abnormalities. There is moderate asymmetric left ventricular hypertrophy of the infero-lateral segment. Left ventricular diastolic parameters are consistent with Grade I diastolic dysfunction (impaired relaxation). Elevated left ventricular end-diastolic pressure.  2. Right ventricular systolic function is normal. The right ventricular size is normal. There is mildly elevated pulmonary artery systolic pressure. The estimated right ventricular systolic pressure is 36.2 mmHg.  3. The mitral valve is degenerative. Trivial mitral valve regurgitation. No evidence of mitral stenosis.  4. The aortic valve is calcified. Aortic valve regurgitation is mild. Severe aortic valve stenosis. Aortic valve area, by VTI measures 0.87 cm. Aortic valve mean gradient measures 51.0 mmHg. Aortic valve Vmax measures 4.56 m/s.  5. 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 There is borderline dilatation of the aortic root, measuring 37 mm.  6. The inferior vena cava is normal in size with greater than  50% respiratory variability, suggesting right atrial pressure of 3 mmHg. FINDINGS  Left Ventricle: Left ventricular ejection fraction, by estimation, is 60 to 65%. The left ventricle has normal function. The left ventricle has no regional wall motion abnormalities. The left ventricular internal cavity size was normal in size. There is  moderate asymmetric left ventricular hypertrophy of the infero-lateral segment. Left ventricular diastolic parameters are consistent with Grade I diastolic dysfunction (impaired relaxation). Elevated left ventricular end-diastolic pressure. Right Ventricle: The right ventricular size is normal. No increase in right ventricular wall thickness. Right ventricular systolic function is normal. There is mildly elevated pulmonary artery systolic pressure. The tricuspid regurgitant velocity is 2.88  m/s, and with an assumed right atrial pressure of 3 mmHg, the estimated right ventricular systolic pressure is 36.2 mmHg. Left Atrium: Left atrial size was normal in size. Right Atrium: Right atrial size was normal in size. Pericardium: There is no evidence of pericardial effusion. Mitral Valve: The mitral valve is degenerative in appearance. There is mild thickening of the mitral valve leaflet(s). There is mild calcification of the mitral valve leaflet(s). Mild mitral annular calcification. Trivial mitral valve regurgitation. No evidence of mitral valve stenosis. Tricuspid Valve: The tricuspid valve is normal in structure. Tricuspid valve regurgitation is mild . No evidence of tricuspid stenosis. Aortic Valve: The aortic valve is calcified. Aortic valve regurgitation is mild. Severe aortic stenosis is present. Aortic valve mean gradient measures 51.0 mmHg. Aortic valve peak gradient measures 83.2 mmHg. Aortic valve area, by VTI measures 0.87 cm. Pulmonic Valve: The pulmonic valve was normal in structure. Pulmonic valve regurgitation is trivial. No evidence of pulmonic stenosis. Aorta: The aortic  root is normal in size and structure. 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 There is borderline dilatation of the aortic root, measuring 37 mm. Venous: The inferior vena cava is normal in size with greater than 50% respiratory variability, suggesting right atrial pressure of 3 mmHg. IAS/Shunts: No atrial level shunt detected by color flow Doppler.  LEFT VENTRICLE PLAX 2D LVIDd:         4.90 cm   Diastology LVIDs:         3.50 cm   LV e' medial:    6.31 cm/s LV PW:         1.60 cm   LV E/e' medial:  15.1 LV IVS:        0.90 cm   LV e' lateral:   4.68 cm/s LVOT diam:     2.30 cm   LV E/e' lateral: 20.4 LV SV:         101 LV SV Index:   54 LVOT Area:     4.15 cm  RIGHT VENTRICLE             IVC RV S prime:     12.30 cm/s  IVC diam: 1.80 cm LEFT ATRIUM             Index        RIGHT ATRIUM           Index LA diam:        4.20 cm 2.25 cm/m   RA Area:     16.90 cm LA Vol (A2C):   59.0 ml 31.56 ml/m  RA Volume:   38.10 ml  20.38 ml/m LA Vol (A4C):   60.1 ml 32.15 ml/m LA Biplane Vol: 62.7 ml 33.54 ml/m  AORTIC VALVE AV Area (Vmax):    1.03 cm AV Area (Vmean):   0.91 cm AV Area (VTI):     0.87 cm AV Vmax:           456.00 cm/s AV Vmean:          339.000 cm/s AV VTI:  1.150 m AV Peak Grad:      83.2 mmHg AV Mean Grad:      51.0 mmHg LVOT Vmax:         113.00 cm/s LVOT Vmean:        74.233 cm/s LVOT VTI:          0.242 m LVOT/AV VTI ratio: 0.21  AORTA Ao Root diam: 3.70 cm Ao Asc diam:  4.40 cm MITRAL VALVE                TRICUSPID VALVE MV Area (PHT): 2.95 cm     TR Peak grad:   33.2 mmHg MV Decel Time: 257 msec     TR Vmax:        288.00 cm/s MV E velocity: 95.40 cm/s MV A velocity: 127.00 cm/s  SHUNTS MV E/A ratio:  0.75         Systemic VTI:  0.24 m                             Systemic Diam: 2.30 cm Armanda Magic MD Electronically signed by Armanda Magic MD Signature Date/Time: 11/12/2022/3:35:02 PM    Final         Scheduled Meds:  insulin aspart  0-5 Units  Subcutaneous QHS   insulin aspart  0-6 Units Subcutaneous TID WC   latanoprost  1 drop Both Eyes QHS   rosuvastatin  40 mg Oral Daily   timolol  1 drop Both Eyes Daily   Continuous Infusions:  ferric gluconate (FERRLECIT) IVPB        LOS: 3 days    Time spent: 35 minutes    Dorcas Carrow, MD [facility] Pager [phone]

## 2022-11-13 NOTE — Care Management Important Message (Signed)
Important Message  Patient Details  Name: [name] MRN: [id number] Date of Birth: 09/03/1940   Medicare Important Message Given:  Yes     [name] 11/13/2022, 2:46 PM

## 2022-11-13 NOTE — Telephone Encounter (Signed)
Patient has been scheduled for a 101-month hospital follow up appt with Willette Cluster, NP on Monday, 01/18/23 at 10 am. Appt information will be on discharge summary and I have mailed a copy to the patient's address on file.

## 2022-11-13 NOTE — Telephone Encounter (Signed)
-----   Message from Unk Lightning, [place] sent at 11/13/2022 11:39 AM EDT ----- Regarding: appt Banessa Mao-patient needs follow-up appointment with Willette Cluster in the next 2 to 3 months to discuss removal of cecal polyp found at time of colonoscopy during this hospitalization.  Thanks, JLL

## 2022-11-13 NOTE — Progress Notes (Signed)
Patient family advised patient is not a diabetic after I gave 1 unit of insulin as ordered for CBG 156.  I contacted Dr. Corrie Mckusick and he advised to stop checking blood sugars.

## 2022-11-13 NOTE — Plan of Care (Signed)
  Problem: Education: Goal: Knowledge of General Education information will improve Description: Including pain rating scale, medication(s)/side effects and non-pharmacologic comfort measures Outcome: Progressing   Problem: Clinical Measurements: Goal: Ability to maintain clinical measurements within normal limits will improve Outcome: Progressing Goal: Will remain free from infection Outcome: Progressing Goal: Diagnostic test results will improve Outcome: Progressing Goal: Respiratory complications will improve Outcome: Progressing Goal: Cardiovascular complication will be avoided Outcome: Progressing   Problem: Health Behavior/Discharge Planning: Goal: Ability to manage health-related needs will improve Outcome: Progressing   Problem: Activity: Goal: Risk for activity intolerance will decrease Outcome: Progressing   Problem: Nutrition: Goal: Adequate nutrition will be maintained Outcome: Progressing   Problem: Coping: Goal: Level of anxiety will decrease Outcome: Progressing   Problem: Elimination: Goal: Will not experience complications related to bowel motility Outcome: Progressing Goal: Will not experience complications related to urinary retention Outcome: Progressing   Problem: Pain Managment: Goal: General experience of comfort will improve Outcome: Progressing   Problem: Safety: Goal: Ability to remain free from injury will improve Outcome: Progressing   Problem: Skin Integrity: Goal: Risk for impaired skin integrity will decrease Outcome: Progressing   Problem: Education: Goal: Ability to describe self-care measures that may prevent or decrease complications (Diabetes Survival Skills Education) will improve Outcome: Progressing Goal: Individualized Educational Video(s) Outcome: Progressing   Problem: Coping: Goal: Ability to adjust to condition or change in health will improve Outcome: Progressing   Problem: Fluid Volume: Goal: Ability to  maintain a balanced intake and output will improve Outcome: Progressing   Problem: Health Behavior/Discharge Planning: Goal: Ability to identify and utilize available resources and services will improve Outcome: Progressing Goal: Ability to manage health-related needs will improve Outcome: Progressing   Problem: Metabolic: Goal: Ability to maintain appropriate glucose levels will improve Outcome: Progressing   Problem: Nutritional: Goal: Maintenance of adequate nutrition will improve Outcome: Progressing Goal: Progress toward achieving an optimal weight will improve Outcome: Progressing   Problem: Skin Integrity: Goal: Risk for impaired skin integrity will decrease Outcome: Progressing   Problem: Tissue Perfusion: Goal: Adequacy of tissue perfusion will improve Outcome: Progressing

## 2022-11-13 NOTE — Progress Notes (Signed)
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  Hemoglobin stable since 11/12/2022 at 7.3--> 7.4--> 1.2, no further signs of bleeding  This morning patient is found trying asleep in his bed, he is comfortable with no acute concerns.  No changes overnight, he has not had a bowel movement since time of procedure.   Objective   Vital signs in last 24 hours: Temp:  [96.6 F (35.9 C)-98.9 F (37.2 C)] 98.1 F (36.7 C) (04/12 0800) Pulse Rate:  [60-77] 70 (04/12 0747) Resp:  [13-25] 14 (04/12 0747) BP: (110-143)/(32-93) 116/93 (04/12 0747) SpO2:  [95 %-100 %] 97 % (04/12 0800) Last BM Date : 11/12/22 General:   AA male in NAD Heart:  Regular rate and rhythm; no murmurs Lungs: Respirations even and unlabored, lungs CTA bilaterally Abdomen:  Soft, nontender and nondistended. Normal bowel sounds. Psych:  Cooperative. Normal mood and affect.  Intake/Output from previous day: 04/11 0701 - 04/12 0700 In: 1535 [P.O.:700; I.V.:520; Blood:315] Out: 400 [Urine:400] Intake/Output this shift: Total I/O In: -  Out: 375 [Urine:375]  Lab Results: Recent Labs    11/11/22 1350 11/12/22 0113 11/12/22 1345 11/12/22 1902 11/13/22 0534  WBC 8.0 6.6  --   --  6.6  HGB 5.4* 6.9* 7.3* 7.4* 7.2*  HCT 16.6* 21.1* 21.1* 22.0* 22.2*  PLT 184 144*  --   --  132*   BMET Recent Labs    11/11/22 1350 11/12/22 0113 11/13/22 0534  NA 141 141 139  K 5.1 4.7 4.3  CL 114* 115* 112*  CO2 18* 18* 21*  GLUCOSE 128* 137* 93  BUN 68* 59* 30*  CREATININE 2.15* 1.93* 1.46*  CALCIUM 8.5* 7.8* 7.9*   LFT Recent Labs    11/10/22 2138  PROT 6.3*   ALBUMIN 3.1*  AST 28  ALT 26  ALKPHOS 46  BILITOT 0.2*   PT/INR Recent Labs    11/10/22 2250  LABPROT 15.2  INR 1.2    Studies/Results: ECHOCARDIOGRAM COMPLETE  Result Date: 11/12/2022    ECHOCARDIOGRAM REPORT   Patient Name:   FEDERICO MAIORINO Date of Exam: 11/12/2022 Medical Rec #:  [id number]       Height:       69.0 in Accession #:    [id number]      Weight:       158.1 lb Date of Birth:  21-Dec-1940       BSA:          1.869 m Patient Age:    81 years        BP:           145/66 mmHg Patient Gender: M               HR:           65 bpm. Exam Location:  Inpatient Procedure: 2D Echo, Cardiac Doppler and Color Doppler Indications:    Elevated Troponin  History:        Patient has prior history of Echocardiogram examinations, most                 recent 06/01/2021. Risk Factors:Hypertension and Dyslipidemia.  CKD, stage 3.  Sonographer:    Lucendia Herrlich Referring Phys: [id number] FANG XU IMPRESSIONS  1. Left ventricular ejection fraction, by estimation, is 60 to 65%. The left ventricle has normal function. The left ventricle has no regional wall motion abnormalities. There is moderate asymmetric left ventricular hypertrophy of the infero-lateral segment. Left ventricular diastolic parameters are consistent with Grade I diastolic dysfunction (impaired relaxation). Elevated left ventricular end-diastolic pressure.  2. Right ventricular systolic function is normal. The right ventricular size is normal. There is mildly elevated pulmonary artery systolic pressure. The estimated right ventricular systolic pressure is 36.2 mmHg.  3. The mitral valve is degenerative. Trivial mitral valve regurgitation. No evidence of mitral stenosis.  4. The aortic valve is calcified. Aortic valve regurgitation is mild. Severe aortic valve stenosis. Aortic valve area, by VTI measures 0.87 cm. Aortic valve mean gradient measures 51.0 mmHg. Aortic valve Vmax measures 4.56 m/s.  5. There is mild dilatation of  the ascending aorta, measuring 44 mm. There is borderline dilatation of the aortic root, measuring 37 mm.  6. The inferior vena cava is normal in size with greater than 50% respiratory variability, suggesting right atrial pressure of 3 mmHg. FINDINGS  Left Ventricle: Left ventricular ejection fraction, by estimation, is 60 to 65%. The left ventricle has normal function. The left ventricle has no regional wall motion abnormalities. The left ventricular internal cavity size was normal in size. There is  moderate asymmetric left ventricular hypertrophy of the infero-lateral segment. Left ventricular diastolic parameters are consistent with Grade I diastolic dysfunction (impaired relaxation). Elevated left ventricular end-diastolic pressure. Right Ventricle: The right ventricular size is normal. No increase in right ventricular wall thickness. Right ventricular systolic function is normal. There is mildly elevated pulmonary artery systolic pressure. The tricuspid regurgitant velocity is 2.88  m/s, and with an assumed right atrial pressure of 3 mmHg, the estimated right ventricular systolic pressure is 36.2 mmHg. Left Atrium: Left atrial size was normal in size. Right Atrium: Right atrial size was normal in size. Pericardium: There is no evidence of pericardial effusion. Mitral Valve: The mitral valve is degenerative in appearance. There is mild thickening of the mitral valve leaflet(s). There is mild calcification of the mitral valve leaflet(s). Mild mitral annular calcification. Trivial mitral valve regurgitation. No evidence of mitral valve stenosis. Tricuspid Valve: The tricuspid valve is normal in structure. Tricuspid valve regurgitation is mild . No evidence of tricuspid stenosis. Aortic Valve: The aortic valve is calcified. Aortic valve regurgitation is mild. Severe aortic stenosis is present. Aortic valve mean gradient measures 51.0 mmHg. Aortic valve peak gradient measures 83.2 mmHg. Aortic valve area, by VTI  measures 0.87 cm. Pulmonic Valve: The pulmonic valve was normal in structure. Pulmonic valve regurgitation is trivial. No evidence of pulmonic stenosis. Aorta: The aortic root is normal in size and structure. 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 There is borderline dilatation of the aortic root, measuring 37 mm. Venous: The inferior vena cava is normal in size with greater than 50% respiratory variability, suggesting right atrial pressure of 3 mmHg. IAS/Shunts: No atrial level shunt detected by color flow Doppler.  LEFT VENTRICLE PLAX 2D LVIDd:         4.90 cm   Diastology LVIDs:         3.50 cm   LV e' medial:    6.31 cm/s LV PW:         1.60 cm   LV E/e' medial:  15.1 LV IVS:  0.90 cm   LV e' lateral:   4.68 cm/s LVOT diam:     2.30 cm   LV E/e' lateral: 20.4 LV SV:         101 LV SV Index:   54 LVOT Area:     4.15 cm  RIGHT VENTRICLE             IVC RV S prime:     12.30 cm/s  IVC diam: 1.80 cm LEFT ATRIUM             Index        RIGHT ATRIUM           Index LA diam:        4.20 cm 2.25 cm/m   RA Area:     16.90 cm LA Vol (A2C):   59.0 ml 31.56 ml/m  RA Volume:   38.10 ml  20.38 ml/m LA Vol (A4C):   60.1 ml 32.15 ml/m LA Biplane Vol: 62.7 ml 33.54 ml/m  AORTIC VALVE AV Area (Vmax):    1.03 cm AV Area (Vmean):   0.91 cm AV Area (VTI):     0.87 cm AV Vmax:           456.00 cm/s AV Vmean:          339.000 cm/s AV VTI:            1.150 m AV Peak Grad:      83.2 mmHg AV Mean Grad:      51.0 mmHg LVOT Vmax:         113.00 cm/s LVOT Vmean:        74.233 cm/s LVOT VTI:          0.242 m LVOT/AV VTI ratio: 0.21  AORTA Ao Root diam: 3.70 cm Ao Asc diam:  4.40 cm MITRAL VALVE                TRICUSPID VALVE MV Area (PHT): 2.95 cm     TR Peak grad:   33.2 mmHg MV Decel Time: 257 msec     TR Vmax:        288.00 cm/s MV E velocity: 95.40 cm/s MV A velocity: 127.00 cm/s  SHUNTS MV E/A ratio:  0.75         Systemic VTI:  0.24 m                             Systemic Diam: 2.30 cm Armanda Magic MD Electronically signed by Armanda Magic MD Signature Date/Time: 11/12/2022/3:35:02 PM    Final      Assessment / Plan:   Assessment: 1.  Hematochezia: With drop in hemoglobin initially, now stable over the past 24 hours, status post colonoscopy 11/12/2022 with treatment of an AVM 2.  AKI on CKD stage III 3.  Cecal polyp: Will need to have follow-up to discuss repeat colonoscopy for removal as an outpatient 4.  Diastolic heart failure 5.  Aortic valve stenosis 6.  Anemia: due to hematochezia as above  Plan: 1.  Per Dr. Adela Lank patient will be arranged for outpatient follow-up for consideration of repeat colonoscopy with removal of cecal polyp.  Will arrange appointment with one of the apps in the coming months to discuss further.   GI will sign off at this time, please call us back if you have any further questions or concerns.   LOS: 3 days   Unk Lightning  11/13/2022, 11:33 AM

## 2022-11-14 DIAGNOSIS — K922 Gastrointestinal hemorrhage, unspecified: Secondary | ICD-10-CM | POA: Diagnosis not present

## 2022-11-14 LAB — CBC
HCT: 23.1 % — ABNORMAL LOW (ref 39.0–52.0)
Hemoglobin: 7.4 g/dL — ABNORMAL LOW (ref 13.0–17.0)
MCH: 29.2 pg (ref 26.0–34.0)
MCHC: 32 g/dL (ref 30.0–36.0)
MCV: 91.3 fL (ref 80.0–100.0)
Platelets: 133 10*3/uL — ABNORMAL LOW (ref 150–400)
RBC: 2.53 MIL/uL — ABNORMAL LOW (ref 4.22–5.81)
RDW: 16.1 % — ABNORMAL HIGH (ref 11.5–15.5)
WBC: 11.1 10*3/uL — ABNORMAL HIGH (ref 4.0–10.5)
nRBC: 0.4 % — ABNORMAL HIGH (ref 0.0–0.2)

## 2022-11-14 LAB — BASIC METABOLIC PANEL
Anion gap: 9 (ref 5–15)
BUN: 28 mg/dL — ABNORMAL HIGH (ref 8–23)
CO2: 20 mmol/L — ABNORMAL LOW (ref 22–32)
Calcium: 7.9 mg/dL — ABNORMAL LOW (ref 8.9–10.3)
Chloride: 106 mmol/L (ref 98–111)
Creatinine, Ser: 1.56 mg/dL — ABNORMAL HIGH (ref 0.61–1.24)
GFR, Estimated: 44 mL/min — ABNORMAL LOW (ref >60)
Glucose, Bld: 107 mg/dL — ABNORMAL HIGH (ref 70–99)
Potassium: 4.4 mmol/L (ref 3.5–5.1)
Sodium: 135 mmol/L (ref 135–145)

## 2022-11-14 NOTE — Progress Notes (Signed)
    Durable Medical Equipment  (From admission, onward)           Start     Ordered   11/14/22 1132  For home use only DME Bedside commode  Once       Question:  Patient needs a bedside commode to treat with the following condition  Answer:  Weakness   11/14/22 1131           Patient confined to room without bathroom, will need bedside commode.

## 2022-11-14 NOTE — Evaluation (Signed)
Occupational Therapy Evaluation Patient Details Name: [name] MRN: [id number] DOB: 1941-06-23 Today's Date: 11/14/2022   History of Present Illness Pt is a 82 yr old male who presented 11/10/22 due to blood in stool. Pt suspected diverticular bleeding and has required transfustion. PMH: BPH, HTN, HLD, NIDDM2, HPpEF, CKDIIIb, pt self reproted stroke in 2008   Clinical Impression   Pt at PLOF reported they live with god daughter who is at home most of the day and did not require an assisted device. Pt in this session required set up to min guard for LE ADLS. Pt was able to ambulate within room level at this time with RW and min guard but reported they needed to rest and requested to lay back into bed. Pt currently with functional limitations due to the deficits listed below (see OT Problem List).  Pt will benefit from acute skilled OT to increase their safety and independence with ADL and functional mobility for ADL to facilitate discharge.        Recommendations for follow up therapy are one component of a multi-disciplinary discharge planning process, led by the attending physician.  Recommendations may be updated based on patient status, additional functional criteria and insurance authorization.   Assistance Recommended at Discharge Frequent or constant Supervision/Assistance  Patient can return home with the following A little help with walking and/or transfers;A little help with bathing/dressing/bathroom;Assistance with cooking/housework;Assist for transportation    Functional Status Assessment  Patient has had a recent decline in their functional status and demonstrates the ability to make significant improvements in function in a reasonable and predictable amount of time.  Equipment Recommendations  Tub/shower seat    Recommendations for Other Services       Precautions / Restrictions Precautions Precautions: Fall Precaution Comments: Pt reported increase in pain in BLE with  increase in ambulation Restrictions Weight Bearing Restrictions: No      Mobility Bed Mobility Overal bed mobility: Modified Independent             General bed mobility comments: HOB elevated    Transfers Overall transfer level: Needs assistance Equipment used: Rolling walker (2 wheels) Transfers: Sit to/from Stand Sit to Stand: Min guard                  Balance Overall balance assessment: Needs assistance Sitting-balance support: Feet supported Sitting balance-Leahy Scale: Good Sitting balance - Comments: eating food at EOB   Standing balance support: Bilateral upper extremity supported, No upper extremity supported Standing balance-Leahy Scale: Fair Standing balance comment: Pt on first attempt required walker due to unsteadiness but was able to take about 7 ft of ambulation with no walker with min guard                           ADL either performed or assessed with clinical judgement   ADL Overall ADL's : Needs assistance/impaired Eating/Feeding: Independent;Sitting   Grooming: Wash/dry hands;Wash/dry face;Oral care;Applying deodorant;Set up;Sitting   Upper Body Bathing: Set up;Sitting   Lower Body Bathing: Min guard;Sit to/from stand   Upper Body Dressing : Set up;Sitting   Lower Body Dressing: Min guard;Sit to/from stand   Toilet Transfer: Min guard;Cueing for safety;Cueing for sequencing;Rolling walker (2 wheels)   Toileting- Architect and Hygiene: Min guard;Minimal assistance;Sit to/from stand   Tub/ Shower Transfer: Minimal assistance;Cueing for safety;Cueing for sequencing;Rolling walker (2 wheels)   Functional mobility during ADLs: Min guard;Rolling walker (2 wheels);Cueing for sequencing;Cueing for  safety       Vision Baseline Vision/History: 1 Wears glasses Ability to See in Adequate Light: 0 Adequate Patient Visual Report: No change from baseline Vision Assessment?: No apparent visual deficits      Perception Perception Perception Tested?: No   Praxis      Pertinent Vitals/Pain Pain Assessment Pain Assessment: Faces Faces Pain Scale: Hurts a little bit Pain Location: Pt reports BLE pain with increase in ambulation but did not report a number Pain Descriptors / Indicators: Discomfort, Grimacing Pain Intervention(s): Limited activity within patient's tolerance, Monitored during session, Repositioned     Hand Dominance Right   Extremity/Trunk Assessment Upper Extremity Assessment Upper Extremity Assessment: Overall WFL for tasks assessed   Lower Extremity Assessment Lower Extremity Assessment: Defer to PT evaluation   Cervical / Trunk Assessment Cervical / Trunk Assessment: Kyphotic   Communication Communication Communication: No difficulties   Cognition Arousal/Alertness: Awake/alert Behavior During Therapy: WFL for tasks assessed/performed Overall Cognitive Status: Within Functional Limits for tasks assessed                                       General Comments       Exercises     Shoulder Instructions      Home Living Family/patient expects to be discharged to:: Private residence Living Arrangements: Other (Comment) (God daughter Elmarie Shiley who is present most of the day) Available Help at Discharge: Family Type of Home: House Home Access: Stairs to enter Secretary/administrator of Steps: 3-4 Entrance Stairs-Rails: None Home Layout: One level     Bathroom Shower/Tub: Chief Strategy Officer: Standard Bathroom Accessibility: Yes   Home Equipment: None;Grab bars - tub/shower          Prior Functioning/Environment Prior Level of Function : Independent/Modified Independent                        OT Problem List: Decreased strength;Decreased activity tolerance;Impaired balance (sitting and/or standing);Pain      OT Treatment/Interventions: Self-care/ADL training;DME and/or AE instruction;Therapeutic  activities;Patient/family education;Balance training    OT Goals(Current goals can be found in the care plan section) Acute Rehab OT Goals Patient Stated Goal: to rest OT Goal Formulation: With patient Time For Goal Achievement: 11/28/22 Potential to Achieve Goals: Good  OT Frequency: Min 2X/week    Co-evaluation              AM-PAC OT "6 Clicks" Daily Activity     Outcome Measure Help from another person eating meals?: None Help from another person taking care of personal grooming?: None Help from another person toileting, which includes using toliet, bedpan, or urinal?: A Little Help from another person bathing (including washing, rinsing, drying)?: A Little Help from another person to put on and taking off regular upper body clothing?: None Help from another person to put on and taking off regular lower body clothing?: A Little 6 Click Score: 21   End of Session Equipment Utilized During Treatment: Gait belt;Rolling walker (2 wheels) Nurse Communication: Mobility status  Activity Tolerance: Patient tolerated treatment well Patient left: in bed;with call bell/phone within reach;with bed alarm set  OT Visit Diagnosis: Unsteadiness on feet (R26.81);Other abnormalities of gait and mobility (R26.89);Muscle weakness (generalized) (M62.81)                Time: 8166-1969 OT Time Calculation (min): 24 min Charges:  OT  General Charges $OT Visit: 1 Visit OT Evaluation $OT Eval Low Complexity: 1 Low OT Treatments $Self Care/Home Management : 8-22 mins  Alphia Moh OTR/L  Acute Rehab Services  [phone] office number    Alphia Moh 11/14/2022, 10:52 AM

## 2022-11-14 NOTE — Discharge Summary (Signed)
Physician Discharge Summary  CORAN DIPAOLA MVH:[id number] DOB: Oct 28, 1940 DOA: 11/10/2022  PCP: Frederica Kuster, MD  Admit date: 11/10/2022 Discharge date: 11/14/2022  Admitted From: Home Disposition: Home with home health  Recommendations for Outpatient Follow-up:  Follow up with PCP in 1-2 weeks Please obtain BMP/CBC in one week GI clinic will schedule follow-up  Home Health: PT/OT Equipment/Devices: Bedside commode  Discharge Condition: Fair CODE STATUS: Full code Diet recommendation: Low-salt diet  Discharge summary: 82 year old gentleman with history of BPH, hypertension, hyperlipidemia, heart failure with preserved ejection fraction, CKD stage IIIb presented to the emergency room with about 5 days of intermittent bloody stool.  In the emergency room blood pressures stable.  Hemoglobin 5.6 with recent hemoglobin of 11 last year.  No history of colonoscopy.  Creatinine 2.39. Blood transfusion was started in the ER.  Admitted with GI consultation. Patient was resuscitated and underwent colonoscopy and found to have bleeding angiodysplastic lesion.  Patient received total 5 units of PRBC.      Acute lower GI bleeding, suspected diverticular bleeding. Anemia of acute blood loss:   Presentation hemoglobin 5.6, received total 5 units of PRBC with stabilization now.  Hemoglobin remains 7.2-7.4 without further drop since last 48 hours. Given 2 units of IV iron in the hospital. Clinically improving.  Further bowel movements without blood. Colonoscopy with Blood in the entire examined colon, diverticulosis in the sigmoid colon and descending colon.  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  Internal hemorrhoids. Active lesion was cauterized.  No evidence of further bleeding. GI recommended follow-up colonoscopy, patient will schedule follow-up.   AKI on CKD stage IIIb: Prerenal.  Improved.   Troponin elevation: Likely demand ischemia.  No evidence of  acute coronary syndrome.   Hyperkalemia: In the setting of using losartan.  Treated with iv fluids. Potassium is normalized.  Go back on losartan.   Essential hypertension: Stable.  Resume medications on discharge.   Chronic heart failure with preserved ejection fraction: Euvolemic.  Resume Lasix.   Aortic stenosis by history: Admitting physician ordered echocardiogram.  Shows severe aortic stenosis.  Followed by Dr. Jacinto Halim.   Patient will benefit with home health PT OT.  Going home with support system from family.  Will need recheck of hemoglobin in 1 week.   Discharge Diagnoses:  Principal Problem:   Lower GI bleed Active Problems:   Aortic valve stenosis   Acute blood loss anemia   Acute kidney injury   AVM (arteriovenous malformation) of colon with hemorrhage    Discharge Instructions  Discharge Instructions     Call MD for:  extreme fatigue   Complete by: As directed    Call MD for:  persistant dizziness or light-headedness   Complete by: As directed    Diet - low sodium heart healthy   Complete by: As directed    Face-to-face encounter (required for Medicare/Medicaid patients)   Complete by: As directed    I Dorcas Carrow certify that this patient is under my care and that I, or a nurse practitioner or physician's assistant working with me, had a face-to-face encounter that meets the physician face-to-face encounter requirements with this patient on 11/14/2022. The encounter with the patient was in whole, or in part for the following medical condition(s) which is the primary reason for home health care (List medical condition): GI bleeding   The encounter with the patient was in whole, or in part, for the following medical condition, which is the primary reason for home  health care: GI bleeding   I certify that, based on my findings, the following services are medically necessary home health services: Physical therapy   Reason for Medically Necessary Home Health Services:  Therapy- Home Adaptation to Facilitate Safety   My clinical findings support the need for the above services: Unsafe ambulation due to balance issues   Further, I certify that my clinical findings support that this patient is homebound due to: Unsafe ambulation due to balance issues   Home Health   Complete by: As directed    To provide the following care/treatments:  PT OT     Increase activity slowly   Complete by: As directed       Allergies as of 11/14/2022       Reactions   Penicillins Other (See Comments)   Unknown Has patient had a PCN reaction causing immediate rash, facial/tongue/throat swelling, SOB or lightheadedness with hypotension: {unknown Has patient had a PCN reaction causing severe rash involving mucus membranes or skin necrosis: unknown Has patient had a PCN reaction that required hospitalization {unknown Has patient had a PCN reaction occurring within the last 10 years: no If all of the above answers are "NO", then may proceed with Cephalosporin use.        Medication List     TAKE these medications    amLODipine 5 MG tablet Commonly known as: NORVASC TAKE 1 TABLET EVERY DAY   aspirin 81 MG tablet Take 81 mg by mouth daily.   CALTRATE 600+D PLUS PO Take by mouth. 1 tablet 2 times a day   Comirnaty syringe Generic drug: COVID-19 mRNA vaccine 2023-2024 Inject into the muscle.   dapagliflozin propanediol 10 MG Tabs tablet Commonly known as: Farxiga Take 1 tablet (10 mg total) by mouth daily before breakfast.   ezetimibe 10 MG tablet Commonly known as: ZETIA TAKE 1 TABLET EVERY DAY   furosemide 20 MG tablet Commonly known as: LASIX Take 40 mg by mouth every Monday, Wednesday, and Friday. Prescribed By Dr.Foster, Lawson Fiscal ([facility])   Healthy Eyes/Lutein Tabs Take 1 tablet by mouth daily.   latanoprost 0.005 % ophthalmic solution Commonly known as: XALATAN Place 1 drop into both eyes at bedtime.   losartan 100 MG  tablet Commonly known as: COZAAR TAKE 1 TABLET EVERY DAY   rosuvastatin 40 MG tablet Commonly known as: CRESTOR TAKE 1 TABLET EVERY DAY (DISCONTINUE PRAVASTATIN) What changed: See the new instructions.   Sarna lotion Generic drug: camphor-menthol Apply 1 application topically as needed for itching.   timolol 0.5 % ophthalmic solution Commonly known as: BETIMOL Place 1 drop into both eyes every morning.   triamcinolone 0.1% oint-Cerave equivalent lotion 1:1 mixture Apply topically 2 (two) times daily.        Allergies  Allergen Reactions   Penicillins Other (See Comments)    Unknown  Has patient had a PCN reaction causing immediate rash, facial/tongue/throat swelling, SOB or lightheadedness with hypotension: {unknown Has patient had a PCN reaction causing severe rash involving mucus membranes or skin necrosis: unknown Has patient had a PCN reaction that required hospitalization {unknown Has patient had a PCN reaction occurring within the last 10 years: no If all of the above answers are "NO", then may proceed with Cephalosporin use.    Consultations: Gastroenterology   Procedures/Studies: ECHOCARDIOGRAM COMPLETE  Result Date: 11/12/2022    ECHOCARDIOGRAM REPORT   Patient Name:   [name] Date of Exam: 11/12/2022 Medical Rec #:  [id number]  Height:       69.0 in Accession #:    [id number]      Weight:       158.1 lb Date of Birth:  1941-03-13       BSA:          1.869 m Patient Age:    81 years        BP:           145/66 mmHg Patient Gender: M               HR:           65 bpm. Exam Location:  Inpatient Procedure: 2D Echo, Cardiac Doppler and Color Doppler Indications:    Elevated Troponin  History:        Patient has prior history of Echocardiogram examinations, most                 recent 06/01/2021. Risk Factors:Hypertension and Dyslipidemia.                 CKD, stage 3.  Sonographer:    Lucendia Herrlich Referring Phys: [id number] FANG XU IMPRESSIONS  1. Left  ventricular ejection fraction, by estimation, is 60 to 65%. The left ventricle has normal function. The left ventricle has no regional wall motion abnormalities. There is moderate asymmetric left ventricular hypertrophy of the infero-lateral segment. Left ventricular diastolic parameters are consistent with Grade I diastolic dysfunction (impaired relaxation). Elevated left ventricular end-diastolic pressure.  2. Right ventricular systolic function is normal. The right ventricular size is normal. There is mildly elevated pulmonary artery systolic pressure. The estimated right ventricular systolic pressure is 36.2 mmHg.  3. The mitral valve is degenerative. Trivial mitral valve regurgitation. No evidence of mitral stenosis.  4. The aortic valve is calcified. Aortic valve regurgitation is mild. Severe aortic valve stenosis. Aortic valve area, by VTI measures 0.87 cm. Aortic valve mean gradient measures 51.0 mmHg. Aortic valve Vmax measures 4.56 m/s.  5. 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 There is borderline dilatation of the aortic root, measuring 37 mm.  6. The inferior vena cava is normal in size with greater than 50% respiratory variability, suggesting right atrial pressure of 3 mmHg. FINDINGS  Left Ventricle: Left ventricular ejection fraction, by estimation, is 60 to 65%. The left ventricle has normal function. The left ventricle has no regional wall motion abnormalities. The left ventricular internal cavity size was normal in size. There is  moderate asymmetric left ventricular hypertrophy of the infero-lateral segment. Left ventricular diastolic parameters are consistent with Grade I diastolic dysfunction (impaired relaxation). Elevated left ventricular end-diastolic pressure. Right Ventricle: The right ventricular size is normal. No increase in right ventricular wall thickness. Right ventricular systolic function is normal. There is mildly elevated pulmonary artery systolic  pressure. The tricuspid regurgitant velocity is 2.88  m/s, and with an assumed right atrial pressure of 3 mmHg, the estimated right ventricular systolic pressure is 36.2 mmHg. Left Atrium: Left atrial size was normal in size. Right Atrium: Right atrial size was normal in size. Pericardium: There is no evidence of pericardial effusion. Mitral Valve: The mitral valve is degenerative in appearance. There is mild thickening of the mitral valve leaflet(s). There is mild calcification of the mitral valve leaflet(s). Mild mitral annular calcification. Trivial mitral valve regurgitation. No evidence of mitral valve stenosis. Tricuspid Valve: The tricuspid valve is normal in structure. Tricuspid valve regurgitation is mild . No evidence of tricuspid stenosis. Aortic  Valve: The aortic valve is calcified. Aortic valve regurgitation is mild. Severe aortic stenosis is present. Aortic valve mean gradient measures 51.0 mmHg. Aortic valve peak gradient measures 83.2 mmHg. Aortic valve area, by VTI measures 0.87 cm. Pulmonic Valve: The pulmonic valve was normal in structure. Pulmonic valve regurgitation is trivial. No evidence of pulmonic stenosis. Aorta: The aortic root is normal in size and structure. 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 There is borderline dilatation of the aortic root, measuring 37 mm. Venous: The inferior vena cava is normal in size with greater than 50% respiratory variability, suggesting right atrial pressure of 3 mmHg. IAS/Shunts: No atrial level shunt detected by color flow Doppler.  LEFT VENTRICLE PLAX 2D LVIDd:         4.90 cm   Diastology LVIDs:         3.50 cm   LV e' medial:    6.31 cm/s LV PW:         1.60 cm   LV E/e' medial:  15.1 LV IVS:        0.90 cm   LV e' lateral:   4.68 cm/s LVOT diam:     2.30 cm   LV E/e' lateral: 20.4 LV SV:         101 LV SV Index:   54 LVOT Area:     4.15 cm  RIGHT VENTRICLE             IVC RV S prime:     12.30 cm/s  IVC diam: 1.80 cm LEFT  ATRIUM             Index        RIGHT ATRIUM           Index LA diam:        4.20 cm 2.25 cm/m   RA Area:     16.90 cm LA Vol (A2C):   59.0 ml 31.56 ml/m  RA Volume:   38.10 ml  20.38 ml/m LA Vol (A4C):   60.1 ml 32.15 ml/m LA Biplane Vol: 62.7 ml 33.54 ml/m  AORTIC VALVE AV Area (Vmax):    1.03 cm AV Area (Vmean):   0.91 cm AV Area (VTI):     0.87 cm AV Vmax:           456.00 cm/s AV Vmean:          339.000 cm/s AV VTI:            1.150 m AV Peak Grad:      83.2 mmHg AV Mean Grad:      51.0 mmHg LVOT Vmax:         113.00 cm/s LVOT Vmean:        74.233 cm/s LVOT VTI:          0.242 m LVOT/AV VTI ratio: 0.21  AORTA Ao Root diam: 3.70 cm Ao Asc diam:  4.40 cm MITRAL VALVE                TRICUSPID VALVE MV Area (PHT): 2.95 cm     TR Peak grad:   33.2 mmHg MV Decel Time: 257 msec     TR Vmax:        288.00 cm/s MV E velocity: 95.40 cm/s MV A velocity: 127.00 cm/s  SHUNTS MV E/A ratio:  0.75         Systemic VTI:  0.24 m  Systemic Diam: 2.30 cm Armanda Magic MD Electronically signed by Armanda Magic MD Signature Date/Time: 11/12/2022/3:35:02 PM    Final    CT ABDOMEN PELVIS WO CONTRAST  Result Date: 11/11/2022 CLINICAL DATA:  Abdominal pain, vomiting EXAM: CT ABDOMEN AND PELVIS WITHOUT CONTRAST TECHNIQUE: Multidetector CT imaging of the abdomen and pelvis was performed following the standard protocol without IV contrast. RADIATION DOSE REDUCTION: This exam was performed according to the departmental dose-optimization program which includes automated exposure control, adjustment of the mA and/or kV according to patient size and/or use of iterative reconstruction technique. COMPARISON:  None Available. FINDINGS: Lower chest: No acute abnormality Hepatobiliary: No focal hepatic abnormality. Gallbladder unremarkable. Pancreas: No focal abnormality or ductal dilatation. Spleen: No focal abnormality.  Normal size. Adrenals/Urinary Tract: No adrenal abnormality. No focal renal abnormality.  No stones or hydronephrosis. Urinary bladder is unremarkable. Stomach/Bowel: Diffuse colonic diverticulosis. No active diverticulitis. Stomach and small bowel grossly unremarkable. No bowel obstruction. Vascular/Lymphatic: Diffuse aortoiliac atherosclerosis. No evidence of aneurysm or adenopathy. Reproductive: Markedly enlarged prostate with a transverse diameter of 6.4 cm. Other: No free fluid or free air. Musculoskeletal: No acute bony abnormality. IMPRESSION: Diffuse colonic diverticulosis.  No active diverticulitis. No evidence of bowel obstruction. Aortoiliac atherosclerosis. Prostate enlargement. Electronically Signed   By: Charlett Nose M.D.   On: 11/11/2022 03:33   DG Chest Portable 1 View  Result Date: 11/10/2022 CLINICAL DATA:  Shortness of breath EXAM: PORTABLE CHEST 1 VIEW COMPARISON:  11/29/2019 FINDINGS: No acute airspace disease. Stable cardiomediastinal silhouette with aortic atherosclerosis. No pneumothorax. IMPRESSION: No active disease. Electronically Signed   By: Jasmine Pang M.D.   On: 11/10/2022 22:14   (Echo, Carotid, EGD, Colonoscopy, ERCP)    Subjective: Patient seen and examined.  No overnight events.  Eating regular diet.  He had 2 bowel movements yesterday without any evidence of blood.   Discharge Exam: Vitals:   11/13/22 2000 11/14/22 0800  BP: (!) 126/97 137/60  Pulse: 85 88  Resp: 20 20  Temp: 99.2 F (37.3 C)   SpO2: 98% 100%   Vitals:   11/13/22 1133 11/13/22 1531 11/13/22 2000 11/14/22 0800  BP: (!) 125/52 (!) 129/51 (!) 126/97 137/60  Pulse: 66 89 85 88  Resp: 15 (!) Temp:   99.2 F (37.3 C)   TempSrc:   Oral   SpO2: 100% 99% 98% 100%  Weight:      Height:        General: Pt is alert, awake, not in acute distress Cardiovascular: RRR, S1/S2 +, no rubs, no gallops Respiratory: CTA bilaterally, no wheezing, no rhonchi Abdominal: Soft, NT, ND, bowel sounds + Extremities: no edema, no cyanosis    The results of significant diagnostics  from this hospitalization (including imaging, microbiology, ancillary and laboratory) are listed below for reference.     Microbiology: Recent Results (from the past 240 hour(s))  Resp panel by RT-PCR (RSV, Flu A&B, Covid) Anterior Nasal Swab     Status: None   Collection Time: 11/10/22  1:03 AM   Specimen: Anterior Nasal Swab  Result Value Ref Range Status   SARS Coronavirus 2 by RT PCR NEGATIVE NEGATIVE Final   Influenza A by PCR NEGATIVE NEGATIVE Final   Influenza B by PCR NEGATIVE NEGATIVE Final    Comment: (NOTE) The Xpert Xpress SARS-CoV-2/FLU/RSV plus assay is intended as an aid in the diagnosis of influenza from Nasopharyngeal swab specimens and should not be used as a sole basis for treatment. Nasal washings  and aspirates are unacceptable for Xpert Xpress SARS-CoV-2/FLU/RSV testing.  Fact Sheet for Patients: BloggerCourse.com  Fact Sheet for Healthcare Providers: SeriousBroker.it  This test is not yet approved or cleared by the [place] FDA and has been authorized for detection and/or diagnosis of SARS-CoV-2 by FDA under an Emergency Use Authorization (EUA). This EUA will remain in effect (meaning this test can be used) for the duration of the COVID-19 declaration under Section 564(b)(1) of the Act, 21 U.S.C. section 360bbb-3(b)(1), unless the authorization is terminated or revoked.     Resp Syncytial Virus by PCR NEGATIVE NEGATIVE Final    Comment: (NOTE) Fact Sheet for Patients: BloggerCourse.com  Fact Sheet for Healthcare Providers: SeriousBroker.it  This test is not yet approved or cleared by the [place] FDA and has been authorized for detection and/or diagnosis of SARS-CoV-2 by FDA under an Emergency Use Authorization (EUA). This EUA will remain in effect (meaning this test can be used) for the duration of the COVID-19 declaration under Section  564(b)(1) of the Act, 21 U.S.C. section 360bbb-3(b)(1), unless the authorization is terminated or revoked.  Performed at [facility], [address], [place], [place] [address]      Labs: BNP (last 3 results) No results for input(s): "BNP" in the last 8760 hours. Basic Metabolic Panel: Recent Labs  Lab 11/10/22 2138 11/11/22 1350 11/12/22 0113 11/13/22 0534 11/14/22 0251  NA 141 141 141 139 135  K 5.5* 5.1 4.7 4.3 4.4  CL 109 114* 115* 112* 106  CO2 18* 18* 18* 21* 20*  GLUCOSE 187* 128* 137* 93 107*  BUN 68* 68* 59* 30* 28*  CREATININE 2.39* 2.15* 1.93* 1.46* 1.56*  CALCIUM 9.2 8.5* 7.8* 7.9* 7.9*  MG  --   --  2.1  --   --    Liver Function Tests: Recent Labs  Lab 11/10/22 2138  AST 28  ALT 26  ALKPHOS 46  BILITOT 0.2*  PROT 6.3*  ALBUMIN 3.1*   No results for input(s): "LIPASE", "AMYLASE" in the last 168 hours. No results for input(s): "AMMONIA" in the last 168 hours. CBC: Recent Labs  Lab 11/10/22 2138 11/11/22 1350 11/12/22 0113 11/12/22 1345 11/12/22 1902 11/13/22 0534 11/14/22 0251  WBC 8.5 8.0 6.6  --   --  6.6 11.1*  NEUTROABS 6.4  --  4.4  --   --   --   --   HGB 5.6* 5.4* 6.9* 7.3* 7.4* 7.2* 7.4*  HCT 18.4* 16.6* 21.1* 21.1* 22.0* 22.2* 23.1*  MCV 95.8 91.7 90.9  --   --  90.6 91.3  PLT 236 184 144*  --   --  132* 133*   Cardiac Enzymes: No results for input(s): "CKTOTAL", "CKMB", "CKMBINDEX", "TROPONINI" in the last 168 hours. BNP: Invalid input(s): "POCBNP" CBG: Recent Labs  Lab 11/12/22 1645 11/12/22 2159 11/13/22 0746 11/13/22 1144 11/13/22 1530  GLUCAP 161* 79 81 106* 156*   D-Dimer No results for input(s): "DDIMER" in the last 72 hours. Hgb A1c No results for input(s): "HGBA1C" in the last 72 hours. Lipid Profile No results for input(s): "CHOL", "HDL", "LDLCALC", "TRIG", "CHOLHDL", "LDLDIRECT" in the last 72 hours. Thyroid function studies No results for input(s): "TSH", "T4TOTAL", "T3FREE", "THYROIDAB" in the  last 72 hours.  Invalid input(s): "FREET3" Anemia work up No results for input(s): "VITAMINB12", "FOLATE", "FERRITIN", "TIBC", "IRON", "RETICCTPCT" in the last 72 hours. Urinalysis    Component Value Date/Time   COLORURINE YELLOW 11/11/2022 0458   APPEARANCEUR CLEAR 11/11/2022 0458  LABSPEC 1.015 11/11/2022 0458   PHURINE 5.5 11/11/2022 0458   GLUCOSEU 100 (A) 11/11/2022 0458   HGBUR NEGATIVE 11/11/2022 0458   BILIRUBINUR NEGATIVE 11/11/2022 0458   KETONESUR NEGATIVE 11/11/2022 0458   PROTEINUR NEGATIVE 11/11/2022 0458   UROBILINOGEN 1.0 01/02/2007 0322   NITRITE NEGATIVE 11/11/2022 0458   LEUKOCYTESUR NEGATIVE 11/11/2022 0458   Sepsis Labs Recent Labs  Lab 11/11/22 1350 11/12/22 0113 11/13/22 0534 11/14/22 0251  WBC 8.0 6.6 6.6 11.1*   Microbiology Recent Results (from the past 240 hour(s))  Resp panel by RT-PCR (RSV, Flu A&B, Covid) Anterior Nasal Swab     Status: None   Collection Time: 11/10/22  1:03 AM   Specimen: Anterior Nasal Swab  Result Value Ref Range Status   SARS Coronavirus 2 by RT PCR NEGATIVE NEGATIVE Final   Influenza A by PCR NEGATIVE NEGATIVE Final   Influenza B by PCR NEGATIVE NEGATIVE Final    Comment: (NOTE) The Xpert Xpress SARS-CoV-2/FLU/RSV plus assay is intended as an aid in the diagnosis of influenza from Nasopharyngeal swab specimens and should not be used as a sole basis for treatment. Nasal washings and aspirates are unacceptable for Xpert Xpress SARS-CoV-2/FLU/RSV testing.  Fact Sheet for Patients: BloggerCourse.com  Fact Sheet for Healthcare Providers: SeriousBroker.it  This test is not yet approved or cleared by the [place] FDA and has been authorized for detection and/or diagnosis of SARS-CoV-2 by FDA under an Emergency Use Authorization (EUA). This EUA will remain in effect (meaning this test can be used) for the duration of the COVID-19 declaration under Section  564(b)(1) of the Act, 21 U.S.C. section 360bbb-3(b)(1), unless the authorization is terminated or revoked.     Resp Syncytial Virus by PCR NEGATIVE NEGATIVE Final    Comment: (NOTE) Fact Sheet for Patients: BloggerCourse.com  Fact Sheet for Healthcare Providers: SeriousBroker.it  This test is not yet approved or cleared by the [place] FDA and has been authorized for detection and/or diagnosis of SARS-CoV-2 by FDA under an Emergency Use Authorization (EUA). This EUA will remain in effect (meaning this test can be used) for the duration of the COVID-19 declaration under Section 564(b)(1) of the Act, 21 U.S.C. section 360bbb-3(b)(1), unless the authorization is terminated or revoked.  Performed at [facility], [address], [place], [place] [address]      Time coordinating discharge:  35 minutes  SIGNED:   Dorcas Carrow, MD  [facility] 11/14/2022, 10:59 AM

## 2022-11-14 NOTE — Progress Notes (Signed)
PT Cancellation Note  Patient Details Name: [name] MRN: [id number] DOB: 1941-01-05   Cancelled Treatment:    Reason Eval/Treat Not Completed: Other (comment) (This am, OT asked PT to check on pt in pm to give him time to rest.  Upon return, pt d/c'd home.)   Bevelyn Buckles 11/14/2022, 2:34 PM Priya Matsen M,PT Acute Rehab Services [phone]

## 2022-11-14 NOTE — TOC Transition Note (Signed)
Transition of Care [facility]) - CM/SW Discharge Note   Patient Details  Name: [name] MRN: [id number] Date of Birth: 01/20/1941  Transition of Care [facility]) CM/SW Contact:  Lawerance Sabal, RN Phone Number: 11/14/2022, 11:36 AM   Clinical Narrative:     Sherron Monday w patient over the phone.  HH services set up with [facility], bedside commode to be delivered to the room prior to DC.    Final next level of care: Home w Home Health Services Barriers to Discharge: No Barriers Identified   Patient Goals and CMS Choice CMS Medicare.gov Compare Post Acute Care list provided to:: Patient    Discharge Placement                         Discharge Plan and Services Additional resources added to the After Visit Summary for                  DME Arranged: Bedside commode DME Agency: [employer] Date DME Agency Contacted: 11/14/22 Time DME Agency Contacted: 1135 Representative spoke with at DME Agency: Leavy Cella HH Arranged: PT, OT HH Agency: [facility] Date [facility] Agency Contacted: 11/14/22 Time HH Agency Contacted: 1136 Representative spoke with at [facility] Agency: Kandee Keen  Social Determinants of Health (SDOH) Interventions SDOH Screenings   Food Insecurity: No Food Insecurity (11/11/2022)  Housing: Low Risk  (11/11/2022)  Transportation Needs: No Transportation Needs (11/11/2022)  Utilities: Not At Risk (11/11/2022)  Alcohol Screen: Low Risk  (07/21/2018)  Depression (PHQ2-9): Low Risk  (05/05/2022)  Financial Resource Strain: Low Risk  (04/18/2018)  Physical Activity: Insufficiently Active (04/18/2018)  Social Connections: Moderately Isolated (04/18/2018)  Stress: No Stress Concern Present (04/18/2018)  Tobacco Use: Medium Risk (11/12/2022)     Readmission Risk Interventions     No data to display

## 2022-11-15 ENCOUNTER — Encounter (HOSPITAL_COMMUNITY): Payer: Self-pay | Admitting: Gastroenterology

## 2022-11-16 ENCOUNTER — Other Ambulatory Visit: Payer: Self-pay

## 2022-11-16 ENCOUNTER — Ambulatory Visit (HOSPITAL_COMMUNITY)
Admission: EM | Admit: 2022-11-16 | Discharge: 2022-11-16 | Disposition: A | Discharge status: Home / Self Care | Payer: Medicare HMO | Attending: Internal Medicine | Admitting: Internal Medicine

## 2022-11-16 ENCOUNTER — Emergency Department (HOSPITAL_COMMUNITY)
Admission: EM | Admit: 2022-11-16 | Discharge: 2022-11-16 | Disposition: A | Discharge status: Home / Self Care | Payer: Medicare HMO | Attending: Emergency Medicine | Admitting: Emergency Medicine

## 2022-11-16 ENCOUNTER — Emergency Department (HOSPITAL_BASED_OUTPATIENT_CLINIC_OR_DEPARTMENT_OTHER): Payer: Medicare HMO

## 2022-11-16 ENCOUNTER — Emergency Department (HOSPITAL_COMMUNITY): Payer: Medicare HMO

## 2022-11-16 ENCOUNTER — Encounter (HOSPITAL_COMMUNITY): Payer: Self-pay | Admitting: Emergency Medicine

## 2022-11-16 ENCOUNTER — Ambulatory Visit: Payer: Medicare HMO | Admitting: Nurse Practitioner

## 2022-11-16 DIAGNOSIS — M7989 Other specified soft tissue disorders: Secondary | ICD-10-CM | POA: Diagnosis not present

## 2022-11-16 DIAGNOSIS — I808 Phlebitis and thrombophlebitis of other sites: Secondary | ICD-10-CM | POA: Diagnosis not present

## 2022-11-16 DIAGNOSIS — R0602 Shortness of breath: Secondary | ICD-10-CM

## 2022-11-16 DIAGNOSIS — I82619 Acute embolism and thrombosis of superficial veins of unspecified upper extremity: Secondary | ICD-10-CM | POA: Diagnosis not present

## 2022-11-16 DIAGNOSIS — M79602 Pain in left arm: Secondary | ICD-10-CM | POA: Diagnosis present

## 2022-11-16 DIAGNOSIS — Z7982 Long term (current) use of aspirin: Secondary | ICD-10-CM | POA: Diagnosis not present

## 2022-11-16 DIAGNOSIS — J90 Pleural effusion, not elsewhere classified: Secondary | ICD-10-CM | POA: Diagnosis not present

## 2022-11-16 LAB — CBC WITH DIFFERENTIAL/PLATELET
Abs Immature Granulocytes: 0.04 10*3/uL (ref 0.00–0.07)
Basophils Absolute: 0 10*3/uL (ref 0.0–0.1)
Basophils Relative: 0 %
Eosinophils Absolute: 0.2 10*3/uL (ref 0.0–0.5)
Eosinophils Relative: 2 %
HCT: 24.4 % — ABNORMAL LOW (ref 39.0–52.0)
Hemoglobin: 8 g/dL — ABNORMAL LOW (ref 13.0–17.0)
Immature Granulocytes: 1 %
Lymphocytes Relative: 12 %
Lymphs Abs: 0.9 10*3/uL (ref 0.7–4.0)
MCH: 30.2 pg (ref 26.0–34.0)
MCHC: 32.8 g/dL (ref 30.0–36.0)
MCV: 92.1 fL (ref 80.0–100.0)
Monocytes Absolute: 0.9 10*3/uL (ref 0.1–1.0)
Monocytes Relative: 12 %
Neutro Abs: 5.5 10*3/uL (ref 1.7–7.7)
Neutrophils Relative %: 73 %
Platelets: 173 10*3/uL (ref 150–400)
RBC: 2.65 MIL/uL — ABNORMAL LOW (ref 4.22–5.81)
RDW: 16.8 % — ABNORMAL HIGH (ref 11.5–15.5)
WBC: 7.6 10*3/uL (ref 4.0–10.5)
nRBC: 0.3 % — ABNORMAL HIGH (ref 0.0–0.2)

## 2022-11-16 LAB — COMPREHENSIVE METABOLIC PANEL
ALT: 29 U/L (ref 0–44)
AST: 42 U/L — ABNORMAL HIGH (ref 15–41)
Albumin: 2.5 g/dL — ABNORMAL LOW (ref 3.5–5.0)
Alkaline Phosphatase: 60 U/L (ref 38–126)
Anion gap: 8 (ref 5–15)
BUN: 26 mg/dL — ABNORMAL HIGH (ref 8–23)
CO2: 17 mmol/L — ABNORMAL LOW (ref 22–32)
Calcium: 7.9 mg/dL — ABNORMAL LOW (ref 8.9–10.3)
Chloride: 111 mmol/L (ref 98–111)
Creatinine, Ser: 1.83 mg/dL — ABNORMAL HIGH (ref 0.61–1.24)
GFR, Estimated: 37 mL/min — ABNORMAL LOW (ref >60)
Glucose, Bld: 137 mg/dL — ABNORMAL HIGH (ref 70–99)
Potassium: 4.4 mmol/L (ref 3.5–5.1)
Sodium: 136 mmol/L (ref 135–145)
Total Bilirubin: 0.4 mg/dL (ref 0.3–1.2)
Total Protein: 5.5 g/dL — ABNORMAL LOW (ref 6.5–8.1)

## 2022-11-16 LAB — LACTIC ACID, PLASMA: Lactic Acid, Venous: 1.9 mmol/L (ref 0.5–1.9)

## 2022-11-16 MED ORDER — CEPHALEXIN 500 MG PO CAPS: 500.0000 mg | ORAL_CAPSULE | Freq: Three times a day (TID) | ORAL | 0 refills | Status: DC

## 2022-11-16 MED ORDER — ACETAMINOPHEN 325 MG PO TABS
650.0000 mg | ORAL_TABLET | Freq: Once | ORAL | Status: AC
  Administered 2022-11-16: 650 mg via ORAL
  Filled 2022-11-16: qty 2

## 2022-11-16 MED ORDER — CEPHALEXIN 250 MG PO CAPS
500.0000 mg | ORAL_CAPSULE | Freq: Once | ORAL | Status: AC
  Administered 2022-11-16: 500 mg via ORAL
  Filled 2022-11-16: qty 2

## 2022-11-16 NOTE — ED Triage Notes (Signed)
Patient presents to [facility] for evaluation after waking up this morning and becoming short of breath after using the bathroom to have a bowel movement.  Patient recently admitted for blood transfusions due to colon bleed.

## 2022-11-16 NOTE — ED Triage Notes (Signed)
Patient here for evaluation of shortness of breath and left arm redness and tenderness around where he recently have an IV from a hospital admission. Patient is alert, oriented, speaking in complete sentences, and is in no apparent distress at this time.

## 2022-11-16 NOTE — Discharge Instructions (Addendum)
[name]  Thank you for allowing [place] to take care of you today.  You came to the Emergency Department today because you are having some shortness of breath and you had redness and swelling in your left upper arm.  You do not have an elevated white cell count, but we do think that you have some inflammation concerning for cellulitis, localized infection, we are going to give you an antibiotic called Keflex that you should take 3 times a day for the next 7 days.  We did an ultrasound to look for any deep vein thrombosis, you do not have any deep vein thrombosis (a blood clot in the deep veins), you do have a blood clot in the superficial veins (superficial thrombophlebitis).  We talked to the hospitalist team, and they declined to admit you.  We do not need to give you anticoagulation for an arm thrombophlebitis, we only need to anticoagulate thrombophlebitis when it is in the leg.  You can use Tylenol for pain control, this is an anti-inflammatory medication that you buy over-the-counter, not an anticoagulant.  You can also do warm compresses for pain.  We will also give you the antibiotics, and that should help with the pain and inflammation.  You should come back to the ED in 3 days for reevaluation, please come back sooner if the wound is enlarging, getting significantly more painful, if you are developing a severe fever, or you have any new or different symptoms.Marland Kitchen  To-Do: 1. Please follow-up with your primary doctor within 2 days / as soon as possible.    Please return to the Emergency Department or call 911 if you experience have worsening of your symptoms, or do not get better, new or different chest pain, shortness of breath, severe or significantly worsening pain, high fever, severe confusion, pass out or have any reason to think that you need emergency medical care.   We hope you feel better soon.   Department of Emergency Medicine [facility]

## 2022-11-16 NOTE — ED Notes (Signed)
Patient is being discharged from the Urgent Care and sent to the Emergency Department via Private Vehicle . Per Dorann Ou, patient is in need of higher level of care due to Shortness of Breath with recent admission and blood transfusions. Patient is aware and verbalizes understanding of plan of care.  Vitals:   11/16/22 1033  BP: (!) 146/64  Pulse: 91  Resp: (!) 24  SpO2: 100%

## 2022-11-16 NOTE — ED Provider Notes (Signed)
I was called into triage to evaluate patient by nursing staff.  Reports several hour history of shortness of breath.  He was recently hospitalized due to GI bleed and required transfusions.  He is not having any chest pain but is feeling generally weak.  He is also concerned because there is a red swollen area of his left forearm where the IV had been placed.  EKG was obtained that showed normal sinus rhythm with ventricular rate of 86 bpm; compared to 11/10/2022 tracing T wave inversion in inferior leads that is new.  Discussed with caregiver that the safest thing to do is go to the emergency room since we do not have imaging capabilities in urgent care.  They are agreeable and will go directly to [facility], ER.  He was stable at the time of discharge.   Jeani Hawking, PA-C 11/16/22 1054

## 2022-11-16 NOTE — Progress Notes (Signed)
Left upper ext venous  has been completed. Refer to [facility] under chart review to view preliminary results.   11/16/2022  3:43 PM Olanna Percifield, Gerarda Gunther

## 2022-11-16 NOTE — ED Provider Notes (Signed)
  [place] EMERGENCY DEPARTMENT AT [address] [facility] Transfer of Care Note I assumed care of MICCO GOODFELLOW on 11/16/2022 at 3 AM from [place], [place].   Briefly, PERVIS SCHUENEMAN is a 82 y.o. male who: PMHx: Heart failure, aortic valve stenosis, recent GI bleed, CKD, HTN, HLD P/w mild shortness of breath, area of redness, tenderness, swelling to left forearm where he recently had IV when he was recently hospitalized Shortness of breath felt to be low risk by prior provider, chest x-ray does have small pleural effusion, however patient with normal respiratory rate and effort and saturating 99% on room air    Plan at the time of handoff: Follow-up DVT ultrasound   Please refer to the original provider's note for additional information regarding the care of RALF MATESIC.  Reassessment: I personally reassessed the patient: Patient complained of continued pain and swelling of the arm.   Vital Signs:  ED Triage Vitals  Enc Vitals Group     BP 11/16/22 1110 (!) 159/70     Pulse Rate 11/16/22 1110 96     Resp 11/16/22 1137 (!) 24     Temp 11/16/22 1110 99 F (37.2 C)     Temp Source 11/16/22 1308 Oral     SpO2 11/16/22 1110 100 %     Weight --      Height --      Head Circumference --      Peak Flow --      Pain Score 11/16/22 1110 5     Pain Loc --      Pain Edu? --      Excl. in GC? --      Hemodynamics:  The patient is hemodynamically stable. Mental Status:  The patient is alert  Additional MDM: Followed up DVT ultrasound, patient does not have acute DVT, does have evidence of superficial thrombophlebitis, concern for surrounding cellulitis as well, patient without significant leukocytosis, however did become slightly febrile while in the ED.  Given medical complexity, initially consulted hospitalist for admission, spoke with Dr. Gloriann Loan, who initially declined admission, then stated if vascular surgery felt that patient needed admission and evaluated the patient in the  ED he would consider admission.  Do not feel that vascular surgery would benefit this patient.  Overall, given upper extremity superficial thrombophlebitis is not an indication for anticoagulation, do feel that it is reasonable for patient to have trial of outpatient management.  Discussed warm compresses, Keflex, close follow-up.  Discussed with patient and goddaughter at bedside that if patient is unable to have close PCP follow-up, can be present to the ED in 3 days for recheck.  They were amenable to this plan, discharged in stable condition.  Disposition: DISCHARGE: I believe that the patient is safe for discharge home with outpatient follow-up. Patient was informed of all pertinent physical exam, laboratory, and imaging findings. Patient's suspected etiology of their symptom presentation was discussed with the patient and all questions were answered. We discussed following up with PCP or ED. I provided thorough ED return precautions. The patient feels safe and comfortable with this plan.  Patient seen in conjunction with Dr. Javier Glazier, MD Emergency Medicine, PGY-2    Curley Spice, MD 11/17/22 9735    Derwood Kaplan, MD 11/17/22 [phone]

## 2022-11-16 NOTE — ED Notes (Signed)
Placed a bedside toilet in room

## 2022-11-16 NOTE — ED Notes (Signed)
Ice pack to left lower arm.

## 2022-11-16 NOTE — ED Notes (Signed)
Denny Peon, APP made aware of patient in triage, this RN perfmorning EKG at this time

## 2022-11-16 NOTE — ED Provider Notes (Signed)
[place] EMERGENCY DEPARTMENT AT [facility] Provider Note   CSN: [id number] Arrival date & time: 11/16/22  1103     History {Add pertinent medical, surgical, social history, OB history to HPI:1} Chief Complaint  Patient presents with   Shortness of Breath    [name] is a 82 y.o. male.  Pt complains of shortness of breath and pain in his left arm.  Pt had a blood transfusion and now arm is red and swollen.  Pt has had increasing shortness of breath.  Pt was recently admitted for a gi bleed on 4/11  The history is provided by the patient. No language interpreter was used.  Shortness of Breath Severity:  Moderate Onset quality:  Gradual Duration:  1 week Timing:  Constant Progression:  Worsening Chronicity:  New Relieved by:  Nothing Worsened by:  Nothing Ineffective treatments:  None tried Associated symptoms: no vomiting        Home Medications Prior to Admission medications   Medication Sig Start Date End Date Taking? Authorizing Provider  amLODipine (NORVASC) 5 MG tablet TAKE 1 TABLET EVERY DAY Patient taking differently: Take 5 mg by mouth daily. 10/23/22   Yates Decamp, MD  aspirin 81 MG tablet Take 81 mg by mouth daily.    [provider]  Calcium Carbonate-Vit D-Min (CALTRATE 600+D PLUS PO) Take by mouth. 1 tablet 2 times a day    [provider]  camphor-menthol (SARNA) lotion Apply 1 application topically as needed for itching. 01/01/21   Frederica Kuster, MD  COVID-19 mRNA vaccine [phone] (COMIRNATY) syringe Inject into the muscle. 07/24/22   Judyann Munson, MD  dapagliflozin propanediol (FARXIGA) 10 MG TABS tablet Take 1 tablet (10 mg total) by mouth daily before breakfast. 01/27/22   Frederica Kuster, MD  ezetimibe (ZETIA) 10 MG tablet TAKE 1 TABLET EVERY DAY Patient taking differently: Take 10 mg by mouth daily. 02/09/22   Frederica Kuster, MD  furosemide (LASIX) 20 MG tablet Take 40 mg by mouth every Monday, Wednesday,  and Friday. Prescribed By Dr.Foster, Lawson Fiscal ([facility]) 08/06/22   [provider]  latanoprost (XALATAN) 0.005 % ophthalmic solution Place 1 drop into both eyes at bedtime.  12/18/15   [provider]  losartan (COZAAR) 100 MG tablet TAKE 1 TABLET EVERY DAY Patient taking differently: Take 100 mg by mouth daily. 08/04/22   Frederica Kuster, MD  Multiple Vitamins-Minerals (HEALTHY EYES/LUTEIN) TABS Take 1 tablet by mouth daily.    [provider]  rosuvastatin (CRESTOR) 40 MG tablet TAKE 1 TABLET EVERY DAY (DISCONTINUE PRAVASTATIN) Patient taking differently: Take 40 mg by mouth daily. 05/21/22   Yates Decamp, MD  timolol (BETIMOL) 0.5 % ophthalmic solution Place 1 drop into both eyes every morning.     [provider]  triamcinolone 0.1% oint-Cerave equivalent lotion 1:1 mixture Apply topically 2 (two) times daily. 05/23/21   Frederica Kuster, MD      Allergies    Penicillins    Review of Systems   Review of Systems  Respiratory:  Positive for shortness of breath.   Gastrointestinal:  Negative for vomiting.  All other systems reviewed and are negative.   Physical Exam Updated Vital Signs BP (!) 157/50 (BP Location: Left Arm)   Pulse 75   Temp 98.7 F (37.1 C) (Oral)   Resp 18   SpO2 100%  Physical Exam Vitals and nursing note reviewed.  Constitutional:      Appearance: He is  well-developed.  HENT:     Head: Normocephalic.  Cardiovascular:     Rate and Rhythm: Normal rate and regular rhythm.  Pulmonary:     Effort: Pulmonary effort is normal.     Breath sounds: No decreased breath sounds.  Abdominal:     General: There is no distension.  Musculoskeletal:        General: Normal range of motion.     Cervical back: Normal range of motion.  Skin:    General: Skin is warm.  Neurological:     Mental Status: He is alert and oriented to person, place, and time.     ED Results / Procedures / Treatments   Labs (all labs ordered  are listed, but only abnormal results are displayed) Labs Reviewed  COMPREHENSIVE METABOLIC PANEL - Abnormal; Notable for the following components:      Result Value   CO2 17 (*)    Glucose, Bld 137 (*)    BUN 26 (*)    Creatinine, Ser 1.83 (*)    Calcium 7.9 (*)    Total Protein 5.5 (*)    Albumin 2.5 (*)    AST 42 (*)    GFR, Estimated 37 (*)    All other components within normal limits  CBC WITH DIFFERENTIAL/PLATELET - Abnormal; Notable for the following components:   RBC 2.65 (*)    Hemoglobin 8.0 (*)    HCT 24.4 (*)    RDW 16.8 (*)    nRBC 0.3 (*)    All other components within normal limits  LACTIC ACID, PLASMA  LACTIC ACID, PLASMA    EKG None  Radiology DG Chest 2 View  Result Date: 11/16/2022 CLINICAL DATA:  Shortness of breath. EXAM: CHEST - 2 VIEW COMPARISON:  November 10, 2022. FINDINGS: The heart size and mediastinal contours are within normal limits. Left lung is clear. Minimal right pleural effusion is noted. No definite consolidative process is noted. The visualized skeletal structures are unremarkable. IMPRESSION: Minimal right pleural effusion. Aortic Atherosclerosis (ICD10-I70.0). Electronically Signed   By: Lupita Raider M.D.   On: 11/16/2022 11:53    Procedures Procedures  {Document cardiac monitor, telemetry assessment procedure when appropriate:1}  Medications Ordered in ED Medications - No data to display  ED Course/ Medical Decision Making/ A&P   {   Click here for ABCD2, HEART and other calculatorsREFRESH Note before signing :1}                          Medical Decision Making Amount and/or Complexity of Data Reviewed Labs: ordered. Radiology: ordered.   ***  {Document critical care time when appropriate:1} {Document review of labs and clinical decision tools ie heart score, Chads2Vasc2 etc:1}  {Document your independent review of radiology images, and any outside records:1} {Document your discussion with family members, caretakers, and  with consultants:1} {Document social determinants of health affecting pt's care:1} {Document your decision making why or why not admission, treatments were needed:1} Final Clinical Impression(s) / ED Diagnoses Final diagnoses:  None    Rx / DC Orders ED Discharge Orders     None

## 2022-11-17 ENCOUNTER — Telehealth: Payer: Self-pay

## 2022-11-17 NOTE — Transitions of Care (Post Inpatient/ED Visit) (Signed)
11/17/2022  Name: [name] MRN: [id number] DOB: [birth year]  Today's TOC FU Call Status: Today's TOC FU Call Status:: Successful TOC FU Call Competed TOC FU Call Complete Date: 11/17/22  Transition Care Management Follow-up Telephone Call Date of Discharge: 11/14/22 Discharge Facility[facility]) Type of Discharge: Inpatient Admission Primary Inpatient Discharge Diagnosis:: "hematochezia,N&V, lower GI bleed" How have you been since you were released from the hospital?: Better (Pt states he has had no further GI bleeding-had a normal BM this morning-appeitite has been good.) Any questions or concerns?: Yes Patient Questions/Concerns:: pt shared that he went to ED on yesterday to be evaluated for swelling to left arm-instructed to elevate arm, apply heat and given abx therapy to take Patient Questions/Concerns Addressed: Other: (reviewed with sx mgmt measures and s/s of worsening condition and when to seek medical attention-he voiced understanding-goes to see PCP tomorrow for further eval/follow up)  Items Reviewed: Did you receive and understand the discharge instructions provided?: Yes Medications obtained and verified?: Yes (Medications Reviewed) Any new allergies since your discharge?: No Dietary orders reviewed?: Yes Type of Diet Ordered:: low salt/heart healthy Do you have support at home?: Yes People in Home: other relative(s) Name of Support/Comfort Primary Source: "goddaughter"  Home Care and Equipment/Supplies: Were Home Health Services Ordered?: Yes Name of Home Health Agency:: [facility] Has Agency set up a time to come to your home?: No (patient states staff called yesterday while he was in the ED and are supposed to call him back-confirmed he has contact info for agency and will contact them if he has not heard from them in the next 24hrs) Any new equipment or medical supplies ordered?: Yes Name of Medical supply agency?: [facility] Were you able to get the  equipment/medical supplies?: Yes Do you have any questions related to the use of the equipment/supplies?: No  Functional Questionnaire: Do you need assistance with bathing/showering or dressing?: No Do you need assistance with meal preparation?: Yes Do you need assistance with eating?: No Do you have difficulty maintaining continence: No Do you need assistance with getting out of bed/getting out of a chair/moving?: No Do you have difficulty managing or taking your medications?: No  Follow up appointments reviewed: PCP Follow-up appointment confirmed?: Yes Date of PCP follow-up appointment?: 11/18/22 Follow-up Provider: Carilyn Goodpasture [facility] Follow-up appointment confirmed?: Yes Date of Specialist follow-up appointment?: 01/18/23 Follow-Up Specialty Provider:: Midge Minium Do you need transportation to your follow-up appointment?: No Do you understand care options if your condition(s) worsen?: Yes-patient verbalized understanding  SDOH Interventions Today    Flowsheet Row Most Recent Value  SDOH Interventions   Food Insecurity Interventions Intervention Not Indicated      TOC Interventions Today    Flowsheet Row Most Recent Value  TOC Interventions   TOC Interventions Discussed/Reviewed TOC Interventions Discussed, S/S of infection      Interventions Today    Flowsheet Row Most Recent Value  General Interventions   General Interventions Discussed/Reviewed General Interventions Discussed, Doctor Visits  Doctor Visits Discussed/Reviewed PCP, Doctor Visits Discussed  PCP/Specialist Visits Compliance with follow-up visit  Education Interventions   Education Provided Provided Education  Provided Verbal Education On Nutrition, When to see the doctor, Medication, Other  [reviewed mgmt of left arm swelling]  Nutrition Interventions   Nutrition Discussed/Reviewed Nutrition Discussed, Adding fruits and vegetables, Decreasing salt  Pharmacy Interventions    Pharmacy Dicussed/Reviewed Pharmacy Topics Discussed, Medications and their functions  Safety Interventions   Safety Discussed/Reviewed Safety Discussed  Enzo [name], RN,BSN,CCM [facility] Care Management Care Management Community Coordinator Direct Phone: [phone] Toll Free: [phone] Fax: [phone]

## 2022-11-18 ENCOUNTER — Encounter: Payer: Self-pay | Admitting: Family Medicine

## 2022-11-18 ENCOUNTER — Telehealth: Payer: Self-pay | Admitting: Gastroenterology

## 2022-11-18 ENCOUNTER — Ambulatory Visit (INDEPENDENT_AMBULATORY_CARE_PROVIDER_SITE_OTHER): Payer: Medicare HMO | Admitting: Family Medicine

## 2022-11-18 VITALS — BP 122/72 | HR 80 | Temp 97.3°F | Ht 69.0 in | Wt 160.0 lb

## 2022-11-18 DIAGNOSIS — N184 Chronic kidney disease, stage 4 (severe): Secondary | ICD-10-CM | POA: Diagnosis not present

## 2022-11-18 DIAGNOSIS — I5032 Chronic diastolic (congestive) heart failure: Secondary | ICD-10-CM | POA: Diagnosis not present

## 2022-11-18 DIAGNOSIS — D62 Acute posthemorrhagic anemia: Secondary | ICD-10-CM | POA: Diagnosis not present

## 2022-11-18 DIAGNOSIS — D631 Anemia in chronic kidney disease: Secondary | ICD-10-CM | POA: Diagnosis not present

## 2022-11-18 DIAGNOSIS — K922 Gastrointestinal hemorrhage, unspecified: Secondary | ICD-10-CM | POA: Diagnosis not present

## 2022-11-18 LAB — CBC WITH DIFFERENTIAL/PLATELET
MCH: 29.6 pg (ref 27.0–33.0)
RBC: 2.84 10*6/uL — ABNORMAL LOW (ref 4.20–5.80)
Total Lymphocyte: 15.2 %
WBC: 7.2 10*3/uL (ref 3.8–10.8)

## 2022-11-18 NOTE — Telephone Encounter (Signed)
[name] sent me a message regarding an urgent referral for a GI bleed. Patient is scheduled with Willette Cluster on 01/18/23 and needs sooner appt. Please advise?

## 2022-11-18 NOTE — Progress Notes (Signed)
Provider:  Jacalyn Lefevre, MD  Careteam: Patient Care Team: Frederica Kuster, MD as PCP - General (Family Medicine) Yates Decamp, MD as Consulting Physician (Cardiology) Ernesto Rutherford, MD as Consulting Physician (Ophthalmology) Pryor Ochoa, MD (Inactive) as Consulting Physician (Vascular Surgery) Micki Riley, MD as Consulting Physician (Neurology) Sherrie [name], MD as Consulting Physician (Ophthalmology)  PLACE OF SERVICE:  [facility] CLINIC  Advanced Directive information    Allergies  Allergen Reactions   Penicillins Other (See Comments)    Unknown reaction    Chief Complaint  Patient presents with   Hospitalization Follow-up    Patient presents today for a hospitalization follow-up. He was admitted into [facility] on 11/10/22-11/14/22 for lower GI bleed.     HPI: Patient is a 82 y.o. male this is hospital follow-up visit.  Patient was hospitalized from April 9 to April 13 with a GI bleed resulting in fairly severe anemia which required transfusions.  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  Also was seen earlier this week with redness and swelling in his left arm.  Had ultrasound which did not show DVT but did show phlebitis.  He denies any chest pain but does have some shortness of breath. He also complains of some burning in his legs.  He has no diabetes but does have history of chronic back pain and disc disease Apparently, he was not placed on iron at the time of discharge and I have suggested that he pick up an iron supplement to take every other day.  Review of Systems:  Review of Systems  Constitutional:  Positive for malaise/fatigue.  Respiratory:  Positive for cough.   Cardiovascular: Negative.  Negative for chest pain and leg swelling.  Gastrointestinal:  Positive for blood in stool.  Musculoskeletal:  Positive for back pain.  Neurological:  Positive for weakness.  Psychiatric/Behavioral:  Negative.    All other systems reviewed and are negative.   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   Hyperlipidemia    Hypertension    Hypertensive kidney disease, benign    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)    Pure hypercholesterolemia    Unspecified vitamin D deficiency    Past Surgical History:  Procedure Laterality Date   CATARACT EXTRACTION     COLONOSCOPY WITH PROPOFOL N/A 11/12/2022   Procedure: COLONOSCOPY WITH PROPOFOL;  Surgeon: Benancio Deeds, MD;  Location: [facility] ENDOSCOPY;  Service: Gastroenterology;  Laterality: N/A;   EYE SURGERY     left eye,    cornea repair   HOT HEMOSTASIS N/A 11/12/2022   Procedure: HOT HEMOSTASIS (ARGON PLASMA COAGULATION/BICAP);  Surgeon: Benancio Deeds, MD;  Location: [facility] ENDOSCOPY;  Service: Gastroenterology;  Laterality: N/A;   PERIPHERAL VASCULAR CATHETERIZATION N/A 07/28/2016   Procedure: Lower Extremity Angiography;  Surgeon: Yates Decamp, MD;  Location: [facility] INVASIVE CV LAB;  Service: Cardiovascular;  Laterality: N/A;   Social History:   reports that he quit smoking about 51 years ago. His smoking use included cigarettes. He has a 6.50 pack-year smoking history. He has never used smokeless  tobacco. He reports that he does not drink alcohol and does not use drugs.  Family History  Problem Relation Age of Onset   Cancer Father     Medications: Patient's Medications  New Prescriptions   No medications on file  Previous Medications   AMLODIPINE (NORVASC) 5 MG TABLET    TAKE 1 TABLET EVERY DAY   CEPHALEXIN (KEFLEX) 500 MG CAPSULE    Take 1 capsule (500 mg total) by mouth 3  (three) times daily.   CHOLECALCIFEROL (VITAMIN D-3 PO)    Take 1 tablet by mouth 2 (two) times daily.   DAPAGLIFLOZIN PROPANEDIOL (FARXIGA) 10 MG TABS TABLET    Take 1 tablet (10 mg total) by mouth daily before breakfast.   EZETIMIBE (ZETIA) 10 MG TABLET    TAKE 1 TABLET EVERY DAY   FUROSEMIDE (LASIX) 20 MG TABLET    Take 20 mg by mouth 2 (two) times daily. Prescribed By Dr.Foster, Lawson Fiscal ([facility])   LATANOPROST (XALATAN) 0.005 % OPHTHALMIC SOLUTION    Place 1 drop into both eyes at bedtime.    LOSARTAN (COZAAR) 100 MG TABLET    TAKE 1 TABLET EVERY DAY   MULTIPLE VITAMINS-MINERALS (OCUVITE ADULT 50+) CAPS    Take 1 capsule by mouth daily.   ROSUVASTATIN (CRESTOR) 40 MG TABLET    TAKE 1 TABLET EVERY DAY (DISCONTINUE PRAVASTATIN)   TIMOLOL (BETIMOL) 0.5 % OPHTHALMIC SOLUTION    Place 1 drop into both eyes every morning.    TRIAMCINOLONE 0.1% OINT-CERAVE EQUIVALENT LOTION 1:1 MIXTURE    Apply topically 2 (two) times daily.  Modified Medications   No medications on file  Discontinued Medications   No medications on file    Physical Exam:  Vitals:   11/18/22 0907  Weight: 160 lb (72.6 kg)  Height:  (1.753 m)   Body mass index is 23.63 kg/m. Wt Readings from Last 3 Encounters:  11/18/22 160 lb (72.6 kg)  11/12/22 158 lb 1.1 oz (71.7 kg)  07/22/22 155 lb 6.4 oz (70.5 kg)    Physical Exam Vitals and nursing note reviewed.  Constitutional:      Appearance: Normal appearance.  Cardiovascular:     Rate and Rhythm: Normal rate and regular rhythm.     Heart sounds: Murmur heard.  Pulmonary:     Effort: Pulmonary effort is normal.     Breath sounds: Normal breath sounds.  Musculoskeletal:     Comments: Exam of the left forearm does decrease in redness compared to some marks placed on his arm when diagnosis was made 2 to 3 days ago.  Daughter is using Ace wrap and warm compresses  Neurological:     General: No focal deficit present.     Mental Status: He is alert  and oriented to person, place, and time.     Labs reviewed: Basic Metabolic Panel: Recent Labs    11/12/22 0113 11/13/22 0534 11/14/22 0251 11/16/22 1111  NA 141 139 135 136  K 4.7 4.3 4.4 4.4  CL 115* 112* 106 111  CO2 18* 21* 20* 17*  GLUCOSE 137* 93 107* 137*  BUN 59* 30* 28* 26*  CREATININE 1.93* 1.46* 1.56* 1.83*  CALCIUM 7.8* 7.9* 7.9* 7.9*  MG 2.1  --   --   --    Liver Function Tests: Recent Labs    02/24/22 1052 04/04/22 0000 07/14/22 0000 11/10/22 2138 11/16/22 1111  AST 18  --   --  28 42*  ALT 15  --   --  26 29  ALKPHOS  --   --   --  46 60  BILITOT 0.4  --   --  0.2* 0.4  PROT 7.8  --   --  6.3* 5.5*  ALBUMIN  --    < > 4.3 3.1* 2.5*   < > = values in this interval not displayed.   No results for input(s): "LIPASE", "AMYLASE" in the last 8760 hours. No results for input(s): "AMMONIA" in the last 8760 hours. CBC: Recent Labs    11/10/22 2138 11/11/22 1350 11/12/22 0113 11/12/22 1345 11/13/22 0534 11/14/22 0251 11/16/22 1111  WBC 8.5   < > 6.6  --  6.6 11.1* 7.6  NEUTROABS 6.4  --  4.4  --   --   --  5.5  HGB 5.6*   < > 6.9*   < > 7.2* 7.4* 8.0*  HCT 18.4*   < > 21.1*   < > 22.2* 23.1* 24.4*  MCV 95.8   < > 90.9  --  90.6 91.3 92.1  PLT 236   < > 144*  --  132* 133* 173   < > = values in this interval not displayed.   Lipid Panel: Recent Labs    01/13/22 1155  CHOL 105  HDL 45  LDLCALC 45  TRIG 70  CHOLHDL 2.3   TSH: No results for input(s): "TSH" in the last 8760 hours. A1C: Lab Results  Component Value Date   HGBA1C 11.6 07/14/2022     Assessment/Plan 1. Gastrointestinal hemorrhage, unspecified gastrointestinal hemorrhage type By history no further GI bleeding but patient remains symptomatic from anemia.  Will begin iron supplementation and referral back to GI for follow-up  2. Acute blood loss anemia   3. Anemia in stage 4 chronic kidney disease Patient now has 2 reasons for anemia GI bleed as well as chronic kidney  disease'  I have asked him to limit his physical activity although I do not think he was very active prior to latest GI bleed  4. Chronic diastolic heart failure Hea and symptoms to watch for rt failure is no worse.  He is at high risk with anemia for worsening and I have discussed signs  5. Lower GI bleed Referral back to GI for follow-up    Jacalyn Lefevre, MD [facility] [phone]

## 2022-11-18 NOTE — Telephone Encounter (Signed)
[name], please see 11/13/22 telephone encounter. Dr. Adela Lank performed colonoscopy as inpatient on 11/12/22. Victorino Dike requested an office f/u in 2-3 months.

## 2022-11-18 NOTE — Telephone Encounter (Signed)
Reviewed chart. Was hospitalized for a GI bleed, followed by Dr. Tomasa Rand at that time, I helped with colonoscopy during his stay. 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 Dr. Hyacinth Meeker saw him for follow up today, he reports there is no active GI bleeding currently, Hgb is pending from labs this AM. As long as that the Hgb is improving and no further bleeding this is a non urgent follow up and could wait until June. He does need a follow up colonoscopy at some point to remove colon polyps, not again not urgent.  Bonita Quin, this patient belongs to Dr. Tomasa Rand, can you follow up CBC which remains pending, as long as improving I think okay to wait until June. He should otherwise by on iron. If anemia not improving or bleeding then will need to discuss options. Thanks

## 2022-11-19 ENCOUNTER — Telehealth: Payer: Self-pay

## 2022-11-19 LAB — CBC WITH DIFFERENTIAL/PLATELET
Absolute Monocytes: 907 cells/uL (ref 200–950)
Basophils Absolute: 29 cells/uL (ref 0–200)
Basophils Relative: 0.4 %
Eosinophils Absolute: 540 cells/uL — ABNORMAL HIGH (ref 15–500)
Eosinophils Relative: 7.5 %
HCT: 26.1 % — ABNORMAL LOW (ref 38.5–50.0)
Hemoglobin: 8.4 g/dL — ABNORMAL LOW (ref 13.2–17.1)
Lymphs Abs: 1094 cells/uL (ref 850–3900)
MCHC: 32.2 g/dL (ref 32.0–36.0)
MCV: 91.9 fL (ref 80.0–100.0)
MPV: 10.1 fL (ref 7.5–12.5)
Monocytes Relative: 12.6 %
Neutro Abs: 4630 cells/uL (ref 1500–7800)
Neutrophils Relative %: 64.3 %
Platelets: 234 10*3/uL (ref 140–400)
RDW: 14.8 % (ref 11.0–15.0)

## 2022-11-19 LAB — COMPLETE METABOLIC PANEL WITH GFR
AG Ratio: 1.3 (calc) (ref 1.0–2.5)
ALT: 21 U/L (ref 9–46)
AST: 24 U/L (ref 10–35)
Albumin: 3.1 g/dL — ABNORMAL LOW (ref 3.6–5.1)
Alkaline phosphatase (APISO): 52 U/L (ref 35–144)
BUN/Creatinine Ratio: 16 (calc) (ref 6–22)
BUN: 25 mg/dL (ref 7–25)
CO2: 21 mmol/L (ref 20–32)
Calcium: 8.3 mg/dL — ABNORMAL LOW (ref 8.6–10.3)
Chloride: 110 mmol/L (ref 98–110)
Creat: 1.61 mg/dL — ABNORMAL HIGH (ref 0.70–1.22)
Globulin: 2.4 g/dL (calc) (ref 1.9–3.7)
Glucose, Bld: 148 mg/dL — ABNORMAL HIGH (ref 65–99)
Potassium: 4.2 mmol/L (ref 3.5–5.3)
Sodium: 139 mmol/L (ref 135–146)
Total Bilirubin: 0.3 mg/dL (ref 0.2–1.2)
Total Protein: 5.5 g/dL — ABNORMAL LOW (ref 6.1–8.1)
eGFR: 43 mL/min/{1.73_m2} — ABNORMAL LOW (ref >=60)

## 2022-11-19 NOTE — Telephone Encounter (Signed)
Hgb has improved at 8.4.

## 2022-11-19 NOTE — Telephone Encounter (Signed)
Threasa Alpha with [facility] called stating they will not be evaluating patient for Home Health PT due to speaking with the daughter and being informed that provider told them to hold off for a while.  Message sent to Dr. Jacalyn Lefevre

## 2022-11-25 ENCOUNTER — Ambulatory Visit (HOSPITAL_COMMUNITY)
Admission: RE | Admit: 2022-11-25 | Discharge: 2022-11-25 | Disposition: A | Discharge status: Home / Self Care | Payer: Medicare HMO | Source: Ambulatory Visit | Attending: Vascular Surgery | Admitting: Vascular Surgery

## 2022-11-25 ENCOUNTER — Encounter: Payer: Self-pay | Admitting: Family

## 2022-11-25 ENCOUNTER — Ambulatory Visit (INDEPENDENT_AMBULATORY_CARE_PROVIDER_SITE_OTHER): Payer: Medicare HMO | Admitting: Family

## 2022-11-25 VITALS — BP 148/60 | HR 66 | Temp 97.3°F | Resp 14 | Ht 69.0 in | Wt 160.0 lb

## 2022-11-25 DIAGNOSIS — I808 Phlebitis and thrombophlebitis of other sites: Secondary | ICD-10-CM | POA: Diagnosis not present

## 2022-11-25 DIAGNOSIS — I809 Phlebitis and thrombophlebitis of unspecified site: Secondary | ICD-10-CM

## 2022-11-25 DIAGNOSIS — M7989 Other specified soft tissue disorders: Secondary | ICD-10-CM | POA: Diagnosis not present

## 2022-11-25 MED ORDER — DOXYCYCLINE HYCLATE 100 MG PO TABS
100.0000 mg | ORAL_TABLET | Freq: Two times a day (BID) | ORAL | 0 refills | Status: AC
Start: 2022-11-25 — End: 2022-12-02

## 2022-11-25 MED ORDER — ALIGN PREBIOTIC-PROBIOTIC 5-1.25 MG-GM PO CHEW
1.0000 | CHEWABLE_TABLET | Freq: Every day | ORAL | 0 refills | Status: AC
Start: 2022-11-25 — End: 2022-12-05

## 2022-11-25 NOTE — Progress Notes (Signed)
Provider: Jakaree Pickard FNP-C  Frederica Kuster, MD  Patient Care Team: Frederica Kuster, MD as PCP - General (Family Medicine) Yates Decamp, MD as Consulting Physician (Cardiology) Ernesto Rutherford, MD as Consulting Physician (Ophthalmology) Pryor Ochoa, MD (Inactive) as Consulting Physician (Vascular Surgery) Micki Riley, MD as Consulting Physician (Neurology) Sherrie [name], MD as Consulting Physician (Ophthalmology)  Extended Emergency Contact Information Primary Emergency Contact: Person,[name] Home Phone: [phone] Mobile Phone: [phone] Relation: Daughter Secondary Emergency Contact: Donald Siva, [place] Home Phone: [phone] Relation: Daughter  Code Status:  Full Code  Goals of care: Advanced Directive information    11/12/2022   10:06 AM  Advanced Directives  Does Patient Have a Medical Advance Directive? No  Would patient like information on creating a medical advance directive? No - Patient declined     Chief Complaint  Patient presents with   phlebitis    Left arm recheck. It is doing a little better. Has some swelling and they want another Ultrasound to check for blood clots again. Said he had superficial blood clots. He finished his abx yesterday.     HPI:  Pt is a 82 y.o. male seen today for an acute visit for evaluation of left arm swelling and soreness.He is here with daughter who provides HPI information.States patient had blood Transfusion on 11/10/2022 - 11/12/2022.Had Phlebitis was treated with one week of Keflex three times daily x 7 days.states ultrasound done showed small blood clots under the skin.Has been applying warm compressors daily.  Daughter states arm still swollen,red and sore to touch.  He denies any fever,chills,cough,chest tightness,chest pain,palpitation or shortness of breath. Daughter request another round of antibiotics and U/S to evaluate blood clots.  On chart  review,patient has allergies to PCN states passed out he was given PCN injection several years ago.Daughter states took Keflex without any allergic reaction.  Venous Ultrasound done 11/16/2022 showed acute superficial vein thrombosis involving left cephalic vein.    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   Hyperlipidemia    Hypertension    Hypertensive kidney disease, benign    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)    Pure hypercholesterolemia    Unspecified vitamin D deficiency    Past Surgical History:  Procedure Laterality Date   CATARACT EXTRACTION     COLONOSCOPY WITH PROPOFOL N/A 11/12/2022   Procedure: COLONOSCOPY WITH PROPOFOL;  Surgeon: Benancio Deeds, MD;  Location[facility] END[facility]COPY;  Service: Gastroenterology;  Laterality: N/A;   EYE SURGERY     left eye,    cornea repair   HOT HEMOSTASIS N/A 11/12/2022   Procedure: HOT HEMOSTASIS (ARGON PLASMA COAGULATION/BICAP);  Surgeon: Benancio Deeds, MD;  Location: [facility] ENDOSCOPY;  Service: Gastroenterology;  Laterality: N/A;   PERIPHERAL VASCULAR CATHETERIZATION N/A 07/28/2016   Procedure: Lower Extremity Angiography;  Surgeon: Yates Decamp, MD;  Location: [facility] INVASIVE CV LAB;  Service: Cardiovascular;  Laterality: N/A;    Allergies  Allergen Reactions   Penicillins Other (See Comments)    Unknown reaction    Outpatient Encounter Medications as  of 11/25/2022  Medication Sig   amLODipine (NORVASC) 5 MG tablet TAKE 1 TABLET EVERY DAY (Patient taking differently: Take 5 mg by mouth daily.)   cephALEXin (KEFLEX) 500 MG capsule Take 1 capsule (500 mg total) by mouth 3 (three) times  daily.   Cholecalciferol (VITAMIN D-3 PO) Take 1 tablet by mouth 2 (two) times daily.   dapagliflozin propanediol (FARXIGA) 10 MG TABS tablet Take 1 tablet (10 mg total) by mouth daily before breakfast.   ezetimibe (ZETIA) 10 MG tablet TAKE 1 TABLET EVERY DAY (Patient taking differently: Take 10 mg by mouth every evening.)   furosemide (LASIX) 20 MG tablet Take 20 mg by mouth 2 (two) times daily. Prescribed By Dr.Foster, Lawson Fiscal ([facility])   latanoprost (XALATAN) 0.005 % ophthalmic solution Place 1 drop into both eyes at bedtime.    losartan (COZAAR) 100 MG tablet TAKE 1 TABLET EVERY DAY (Patient taking differently: Take 100 mg by mouth every evening.)   Multiple Vitamins-Minerals (OCUVITE ADULT 50+) CAPS Take 1 capsule by mouth daily.   rosuvastatin (CRESTOR) 40 MG tablet TAKE 1 TABLET EVERY DAY (DISCONTINUE PRAVASTATIN) (Patient taking differently: Take 40 mg by mouth every evening.)   timolol (BETIMOL) 0.5 % ophthalmic solution Place 1 drop into both eyes every morning.    triamcinolone 0.1% oint-Cerave equivalent lotion 1:1 mixture Apply topically 2 (two) times daily. (Patient taking differently: Apply 1 application  topically daily.)   No facility-administered encounter medications on file as of 11/25/2022.    Review of Systems  Constitutional:  Negative for appetite change, chills, fatigue, fever and unexpected weight change.  Respiratory:  Negative for cough, chest tightness, shortness of breath and wheezing.   Cardiovascular:  Negative for chest pain, palpitations and leg swelling.  Musculoskeletal:  Positive for gait problem. Negative for arthralgias, back pain, joint swelling and myalgias.       Left forearm swelling sore and red   Skin:  Negative for color change, pallor, rash and wound.  Neurological:  Negative for dizziness, syncope, speech difficulty, weakness, light-headedness, numbness and headaches.  Hematological:  Does not bruise/bleed easily.     Immunization History  Administered Date(s) Administered   COVID-19, mRNA, vaccine(Comirnaty)12 years and older 07/24/2022   Fluad Quad(high Dose 65+) 04/03/2019, 07/22/2020, 05/06/2021, 05/12/2022   Influenza Split 04/29/2010   Influenza, High Dose Seasonal PF 07/22/2017, 04/18/2018   Influenza,inj,Quad PF,6+ Mos 06/05/2013, 07/09/2014, 06/14/2015   PFIZER Comirnaty(Gray Top)Covid-19 Tri-Sucrose Vaccine 10/04/2020   PFIZER(Purple Top)SARS-COV-2 Vaccination 03/02/2020, 04/02/2020   Pneumococcal Conjugate-13 10/02/2013   Pneumococcal Polysaccharide-23 11/05/2011   Tdap 04/25/2019   Pertinent  Health Maintenance Due  Topic Date Due   INFLUENZA VACCINE  03/04/2023      05/06/2021    9:38 AM 07/04/2021   11:10 AM 05/05/2022    3:29 PM 05/05/2022    3:46 PM 11/18/2022    9:14 AM  Fall Risk  Falls in the past year? 0 0 0 0 0  Was there an injury with Fall? 0 0 0 0 0  Fall Risk Category Calculator 0 0 0 0 0  Fall Risk Category (Retired) Low Low Low Low   (RETIRED) Patient Fall Risk Level Low fall risk Low fall risk Low fall risk Low fall risk   Patient at Risk for Falls Due to History of fall(s) No Fall Risks No Fall Risks No Fall Risks History of fall(s)  Fall risk Follow up Falls evaluation completed;Education provided;Falls prevention discussed Falls evaluation completed;Education provided;Falls prevention discussed Falls evaluation completed  Falls evaluation completed Falls evaluation completed   Functional Status Survey:    Vitals:   11/25/22 1050  BP: (!) 148/60  Pulse: 66  Resp: 14  Temp: (!) 97.3 F (36.3 C)  TempSrc: Temporal  SpO2: 98%  Weight: 160 lb (72.6 kg)  Height:  (1.753 m)   Body mass index is 23.63 kg/m. Physical Exam Vitals reviewed.  Constitutional:      General: He is not in acute distress.    Appearance: Normal appearance. He is normal weight. He is not ill-appearing or diaphoretic.  HENT:     Head: Normocephalic.  Cardiovascular:      Rate and Rhythm: Normal rate and regular rhythm.     Pulses: Normal pulses.     Heart sounds: Murmur heard.     No friction rub. No gallop.  Pulmonary:     Effort: Pulmonary effort is normal. No respiratory distress.     Breath sounds: Normal breath sounds. No wheezing, rhonchi or rales.  Chest:     Chest wall: No tenderness.  Musculoskeletal:        General: No tenderness. Normal range of motion.     Right lower leg: No edema.     Left lower leg: No edema.     Comments: Left forearm erythema,slight warm to touch and swollen.Moves extremities without any difficulties.Previous IV site without any drainage.    Skin:    General: Skin is warm and dry.     Coloration: Skin is not pale.     Findings: No bruising, lesion or rash.     Comments: Left forearm erythema.   Neurological:     Mental Status: He is alert and oriented to person, place, and time.     Motor: No weakness.     Gait: Gait abnormal.  Psychiatric:        Mood and Affect: Mood normal.        Speech: Speech normal.        Behavior: Behavior normal.     Labs reviewed: Recent Labs    11/12/22 0113 11/13/22 0534 11/14/22 0251 11/16/22 1111 11/18/22 0945  NA 141   < > 135 136 139  K 4.7   < > 4.4 4.4 4.2  CL 115*   < > 106 111 110  CO2 18*   < > 20* 17* 21  GLUCOSE 137*   < > 107* 137* 148*  BUN 59*   < > 28* 26* 25  CREATININE 1.93*   < > 1.56* 1.83* 1.61*  CALCIUM 7.8*   < > 7.9* 7.9* 8.3*  MG 2.1  --   --   --   --    < > = values in this interval not displayed.   Recent Labs    07/14/22 0000 11/10/22 2138 11/16/22 1111 11/18/22 0945  AST  --  28 42* 24  ALT  --  ALKPHOS  --  46 60  --   BILITOT  --  0.2* 0.4 0.3  PROT  --  6.3* 5.5* 5.5*  ALBUMIN 4.3 3.1* 2.5*  --    Recent Labs    11/12/22 0113 11/12/22 1345 11/14/22 0251 11/16/22 1111 11/18/22 0945  WBC 6.6   < > 11.1* 7.6 7.2  NEUTROABS 4.4  --   --  5.5 4,630  HGB 6.9*   < > 7.4* 8.0* 8.4*  HCT 21.1*   < > 23.1* 24.4* 26.1*   MCV 90.9   < >  91.3 92.1 91.9  PLT 144*   < > 133* 173 234   < > = values in this interval not displayed.   No results found for: "TSH" Lab Results  Component Value Date   HGBA1C 11.6 07/14/2022   Lab Results  Component Value Date   CHOL 105 01/13/2022   HDL 45 01/13/2022   LDLCALC 45 01/13/2022   TRIG 70 01/13/2022   CHOLHDL 2.3 01/13/2022    Significant Diagnostic Results in last 30 days:  VAS [place] UPPER EXTREMITY VENOUS DUPLEX  Result Date: 11/16/2022 UPPER VENOUS STUDY  Patient Name:  SUSAN ARANA  Date of Exam:   11/16/2022 Medical Rec #: [id number]        Accession #:    [id number] Date of Birth: 12/18/1940        Patient Gender: M Patient Age:   83 years Exam Location:  [facility] Procedure:      VAS [place] UPPER EXTREMITY VENOUS DUPLEX Referring Phys: LESLIE SOFIA --------------------------------------------------------------------------------  Indications: Swelling, Pain, redness left forearm due to multiple IV needle sticks., and SOB Performing Technologist: [place] Sink Sturdivant RDMS, RVT  Examination Guidelines: A complete evaluation includes B-mode imaging, spectral Doppler, color Doppler, and power Doppler as needed of all accessible portions of each vessel. Bilateral testing is considered an integral part of a complete examination. Limited examinations for reoccurring indications may be performed as noted.  Right Findings: +----------+------------+---------+-----------+----------+-------+ RIGHT     CompressiblePhasicitySpontaneousPropertiesSummary +----------+------------+---------+-----------+----------+-------+ Subclavian               Yes       Yes                      +----------+------------+---------+-----------+----------+-------+  Left Findings: +----------+------------+---------+-----------+----------+-------+ LEFT      CompressiblePhasicitySpontaneousPropertiesSummary +----------+------------+---------+-----------+----------+-------+ IJV            Full       Yes       Yes                      +----------+------------+---------+-----------+----------+-------+ Subclavian    Full       Yes       Yes                      +----------+------------+---------+-----------+----------+-------+ Axillary      Full                                          +----------+------------+---------+-----------+----------+-------+ Brachial      Full                                          +----------+------------+---------+-----------+----------+-------+ Cephalic      None       No        No                Acute  +----------+------------+---------+-----------+----------+-------+ Basilic       Full                                          +----------+------------+---------+-----------+----------+-------+ Acute thrombus in the left proximal forearm extending distally.  Summary:  Right: No  evidence of thrombosis in the subclavian.  Left: No evidence of deep vein thrombosis in the upper extremity. Findings consistent with acute superficial vein thrombosis involving the left cephalic vein.  *See table(s) above for measurements and observations.  Diagnosing physician: Sherald Hess MD Electronically signed by Sherald Hess MD on 11/16/2022 at 4:40:11 PM.    Final    DG Chest 2 View  Result Date: 11/16/2022 CLINICAL DATA:  Shortness of breath. EXAM: CHEST - 2 VIEW COMPARISON:  November 10, 2022. FINDINGS: The heart size and mediastinal contours are within normal limits. Left lung is clear. Minimal right pleural effusion is noted. No definite consolidative process is noted. The visualized skeletal structures are unremarkable. IMPRESSION: Minimal right pleural effusion. Aortic Atherosclerosis (ICD10-I70.0). Electronically Signed   By: Lupita Raider M.D.   On: 11/16/2022 11:53   ECHOCARDIOGRAM COMPLETE  Result Date: 11/12/2022    ECHOCARDIOGRAM REPORT   Patient Name:   DONYELL DING Date of Exam: 11/12/2022 Medical Rec #:  [id number]        Height:       69.0 in Accession #:    [id number]      Weight:       158.1 lb Date of Birth:  September 02, 1940       BSA:          1.869 m Patient Age:    81 years        BP:           145/66 mmHg Patient Gender: M               HR:           65 bpm. Exam Location:  Inpatient Procedure: 2D Echo, Cardiac Doppler and Color Doppler Indications:    Elevated Troponin  History:        Patient has prior history of Echocardiogram examinations, most                 recent 06/01/2021. Risk Factors:Hypertension and Dyslipidemia.                 CKD, stage 3.  Sonographer:    Lucendia Herrlich Referring Phys: [id number] FANG XU IMPRESSIONS  1. Left ventricular ejection fraction, by estimation, is 60 to 65%. The left ventricle has normal function. The left ventricle has no regional wall motion abnormalities. There is moderate asymmetric left ventricular hypertrophy of the infero-lateral segment. Left ventricular diastolic parameters are consistent with Grade I diastolic dysfunction (impaired relaxation). Elevated left ventricular end-diastolic pressure.  2. Right ventricular systolic function is normal. The right ventricular size is normal. There is mildly elevated pulmonary artery systolic pressure. The estimated right ventricular systolic pressure is 36.2 mmHg.  3. The mitral valve is degenerative. Trivial mitral valve regurgitation. No evidence of mitral stenosis.  4. The aortic valve is calcified. Aortic valve regurgitation is mild. Severe aortic valve stenosis. Aortic valve area, by VTI measures 0.87 cm. Aortic valve mean gradient measures 51.0 mmHg. Aortic valve Vmax measures 4.56 m/s.  5. 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 There is borderline dilatation of the aortic root, measuring 37 mm.  6. The inferior vena cava is normal in size with greater than 50% respiratory variability, suggesting right atrial pressure of 3 mmHg. FINDINGS  Left Ventricle: Left ventricular ejection fraction, by estimation, is  60 to 65%. The left ventricle has normal function. The left ventricle has no regional wall motion abnormalities. The left ventricular internal cavity  size was normal in size. There is  moderate asymmetric left ventricular hypertrophy of the infero-lateral segment. Left ventricular diastolic parameters are consistent with Grade I diastolic dysfunction (impaired relaxation). Elevated left ventricular end-diastolic pressure. Right Ventricle: The right ventricular size is normal. No increase in right ventricular wall thickness. Right ventricular systolic function is normal. There is mildly elevated pulmonary artery systolic pressure. The tricuspid regurgitant velocity is 2.88  m/s, and with an assumed right atrial pressure of 3 mmHg, the estimated right ventricular systolic pressure is 36.2 mmHg. Left Atrium: Left atrial size was normal in size. Right Atrium: Right atrial size was normal in size. Pericardium: There is no evidence of pericardial effusion. Mitral Valve: The mitral valve is degenerative in appearance. There is mild thickening of the mitral valve leaflet(s). There is mild calcification of the mitral valve leaflet(s). Mild mitral annular calcification. Trivial mitral valve regurgitation. No evidence of mitral valve stenosis. Tricuspid Valve: The tricuspid valve is normal in structure. Tricuspid valve regurgitation is mild . No evidence of tricuspid stenosis. Aortic Valve: The aortic valve is calcified. Aortic valve regurgitation is mild. Severe aortic stenosis is present. Aortic valve mean gradient measures 51.0 mmHg. Aortic valve peak gradient measures 83.2 mmHg. Aortic valve area, by VTI measures 0.87 cm. Pulmonic Valve: The pulmonic valve was normal in structure. Pulmonic valve regurgitation is trivial. No evidence of pulmonic stenosis. Aorta: The aortic root is normal in size and structure. 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 There is borderline dilatation of the aortic  root, measuring 37 mm. Venous: The inferior vena cava is normal in size with greater than 50% respiratory variability, suggesting right atrial pressure of 3 mmHg. IAS/Shunts: No atrial level shunt detected by color flow Doppler.  LEFT VENTRICLE PLAX 2D LVIDd:         4.90 cm   Diastology LVIDs:         3.50 cm   LV e' medial:    6.31 cm/s LV PW:         1.60 cm   LV E/e' medial:  15.1 LV IVS:        0.90 cm   LV e' lateral:   4.68 cm/s LVOT diam:     2.30 cm   LV E/e' lateral: 20.4 LV SV:         101 LV SV Index:   54 LVOT Area:     4.15 cm  RIGHT VENTRICLE             IVC RV S prime:     12.30 cm/s  IVC diam: 1.80 cm LEFT ATRIUM             Index        RIGHT ATRIUM           Index LA diam:        4.20 cm 2.25 cm/m   RA Area:     16.90 cm LA Vol (A2C):   59.0 ml 31.56 ml/m  RA Volume:   38.10 ml  20.38 ml/m LA Vol (A4C):   60.1 ml 32.15 ml/m LA Biplane Vol: 62.7 ml 33.54 ml/m  AORTIC VALVE AV Area (Vmax):    1.03 cm AV Area (Vmean):   0.91 cm AV Area (VTI):     0.87 cm AV Vmax:           456.00 cm/s AV Vmean:          339.000 cm/s AV VTI:  1.150 m AV Peak Grad:      83.2 mmHg AV Mean Grad:      51.0 mmHg LVOT Vmax:         113.00 cm/s LVOT Vmean:        74.233 cm/s LVOT VTI:          0.242 m LVOT/AV VTI ratio: 0.21  AORTA Ao Root diam: 3.70 cm Ao Asc diam:  4.40 cm MITRAL VALVE                TRICUSPID VALVE MV Area (PHT): 2.95 cm     TR Peak grad:   33.2 mmHg MV Decel Time: 257 msec     TR Vmax:        288.00 cm/s MV E velocity: 95.40 cm/s MV A velocity: 127.00 cm/s  SHUNTS MV E/A ratio:  0.75         Systemic VTI:  0.24 m                             Systemic Diam: 2.30 cm Armanda Magic MD Electronically signed by Armanda Magic MD Signature Date/Time: 11/12/2022/3:35:02 PM    Final    CT ABDOMEN PELVIS WO CONTRAST  Result Date: 11/11/2022 CLINICAL DATA:  Abdominal pain, vomiting EXAM: CT ABDOMEN AND PELVIS WITHOUT CONTRAST TECHNIQUE: Multidetector CT imaging of the abdomen and pelvis was  performed following the standard protocol without IV contrast. RADIATION DOSE REDUCTION: This exam was performed according to the departmental dose-optimization program which includes automated exposure control, adjustment of the mA and/or kV according to patient size and/or use of iterative reconstruction technique. COMPARISON:  None Available. FINDINGS: Lower chest: No acute abnormality Hepatobiliary: No focal hepatic abnormality. Gallbladder unremarkable. Pancreas: No focal abnormality or ductal dilatation. Spleen: No focal abnormality.  Normal size. Adrenals/Urinary Tract: No adrenal abnormality. No focal renal abnormality. No stones or hydronephrosis. Urinary bladder is unremarkable. Stomach/Bowel: Diffuse colonic diverticulosis. No active diverticulitis. Stomach and small bowel grossly unremarkable. No bowel obstruction. Vascular/Lymphatic: Diffuse aortoiliac atherosclerosis. No evidence of aneurysm or adenopathy. Reproductive: Markedly enlarged prostate with a transverse diameter of 6.4 cm. Other: No free fluid or free air. Musculoskeletal: No acute bony abnormality. IMPRESSION: Diffuse colonic diverticulosis.  No active diverticulitis. No evidence of bowel obstruction. Aortoiliac atherosclerosis. Prostate enlargement. Electronically Signed   By: Charlett Nose M.D.   On: 11/11/2022 03:33   DG Chest Portable 1 View  Result Date: 11/10/2022 CLINICAL DATA:  Shortness of breath EXAM: PORTABLE CHEST 1 VIEW COMPARISON:  11/29/2019 FINDINGS: No acute airspace disease. Stable cardiomediastinal silhouette with aortic atherosclerosis. No pneumothorax. IMPRESSION: No active disease. Electronically Signed   By: Jasmine Pang M.D.   On: 11/10/2022 22:14    Assessment/Plan 1. Left arm swelling Status post blood transfusion 11/10/2022 - 11/12/2022 superficial vein thrombus on left cephalic vein noted on 11/16/2022. - forearm swollen,red,warm and sore to touch.IV site without any drainage.  - stat left upper arm  Ultrasound ordered made aware imaging center will call to schedule appointment.Provider was Notified by referral Coordinator Laverle Patter appointment scheduled for today at 2:30 pm.Referral coordinator will notify patient.  - VAS [place] UPPER EXTREMITY VENOUS DUPLEX; Future  2. Phlebitis Reports developed after blood transfusion Status post treatment with Keflex but not resolved.allergic to PCN with loss of consciousness reported.per daughter did not have any reaction with keflex.will start on doxycycline as below - Advised to notify provider if symptoms worsen or fail to improve  -  doxycycline (VIBRA-TABS) 100 MG tablet; Take 1 tablet (100 mg total) by mouth 2 (two) times daily for 7 days.  Dispense: 14 tablet; Refill: 0 - Bacillus Coagulans-Inulin (ALIGN PREBIOTIC-PROBIOTIC) 5-1.25 MG-GM CHEW; Chew 1 tablet by mouth daily for 10 days.  Dispense: 10 tablet; Refill: 0 - VAS [place] UPPER EXTREMITY VENOUS DUPLEX; Future    Family/ staff Communication: Reviewed plan of care with patient and daughter verbalized understanding  Labs/tests ordered: - VAS [place] UPPER EXTREMITY VENOUS DUPLEX; Future  Next Appointment: Return if symptoms worsen or fail to improve.   Caesar Bookman, NP

## 2022-11-25 NOTE — Patient Instructions (Signed)
-   notify provider if symptoms worsen or fail to improve  

## 2022-12-02 ENCOUNTER — Telehealth: Payer: Self-pay

## 2022-12-02 NOTE — Telephone Encounter (Signed)
Patient's daughter Elmarie Shiley called requesting that an ultrasound be done to see if bloos clot in patient's arma has resolved before starting another course of antibiotics. He has finished antibiotics.  Message sent to Richarda Blade, NP

## 2022-12-02 NOTE — Telephone Encounter (Signed)
Schedule followup to evaluate.

## 2022-12-03 ENCOUNTER — Ambulatory Visit (INDEPENDENT_AMBULATORY_CARE_PROVIDER_SITE_OTHER): Payer: Medicare HMO | Admitting: Adult Health

## 2022-12-03 ENCOUNTER — Encounter: Payer: Self-pay | Admitting: Adult Health

## 2022-12-03 VITALS — BP 138/62 | HR 65 | Temp 97.9°F | Resp 16 | Ht 69.0 in | Wt 152.6 lb

## 2022-12-03 DIAGNOSIS — D62 Acute posthemorrhagic anemia: Secondary | ICD-10-CM | POA: Diagnosis not present

## 2022-12-03 DIAGNOSIS — I10 Essential (primary) hypertension: Secondary | ICD-10-CM | POA: Diagnosis not present

## 2022-12-03 DIAGNOSIS — I808 Phlebitis and thrombophlebitis of other sites: Secondary | ICD-10-CM

## 2022-12-03 LAB — CBC WITH DIFFERENTIAL/PLATELET
Absolute Monocytes: 544 cells/uL (ref 200–950)
MPV: 9.3 fL (ref 7.5–12.5)
Monocytes Relative: 13.6 %
Neutrophils Relative %: 52.5 %
RBC: 3.46 10*6/uL — ABNORMAL LOW (ref 4.20–5.80)
WBC: 4 10*3/uL (ref 3.8–10.8)

## 2022-12-03 NOTE — Progress Notes (Addendum)
[facility] clinic  Provider: Kenard Gower DNP  Code Status:  Full Code  Goals of Care:     11/12/2022   10:06 AM  Advanced Directives  Does Patient Have a Medical Advance Directive? No  Would patient like information on creating a medical advance directive? No - Patient declined     Chief Complaint  Patient presents with   Follow-up    Fololw up on blood clot. Finished abx and asking for scan to make sure its gone. Also asking for labs to recheck everything.     HPI: Patient is a 82 y.o. male seen today for an acute visit for follow up of left arm swelling and phlebitis. He was accompanied by his goddaughter.He had blood transfusions on 11/10/22 to 11/12/22. He had complication of phlebitis and was treated with Keflex for 7 days. Ultrasound done done showed small blood clots under the skin. Warm compress were applied.   He followed up in the clinic  on 11/25/22 and was given Doxycycline X 7 days. He continued to do warm compress to left arm.  Today, left arm is still with 1+edema. No redness nor tenderness. Goddaughter requested for ultrasound of left arm.    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)    Pure hypercholesterolemia    Unspecified vitamin D deficiency     Past Surgical History:  Procedure Laterality Date   CATARACT EXTRACTION     COLONOSCOPY WITH  PROPOFOL N/A 11/12/2022   Procedure: COLONOSCOPY WITH PROPOFOL;  Surgeon: Benancio Deeds, MD;  Location: [facility] ENDOSCOPY;  Service: Gastroenterology;  Laterality: N/A;   EYE SURGERY     left eye,    cornea repair   HOT HEMOSTASIS N/A 11/12/2022   Procedure: HOT HEMOSTASIS (ARGON PLASMA COAGULATION/BICAP);  Surgeon: Benancio Deeds, MD;  Location: [facility] ENDOSCOPY;  Service: Gastroenterology;  Laterality: N/A;   PERIPHERAL VASCULAR CATHETERIZATION N/A 07/28/2016   Procedure: Lower Extremity Angiography;  Surgeon: Yates Decamp, MD;  Location: [facility] INVASIVE CV LAB;  Service: Cardiovascular;  Laterality: N/A;    Allergies  Allergen Reactions   Penicillins Other (See Comments)    Unknown reaction    Outpatient Encounter Medications as of 12/03/2022  Medication Sig   amLODipine (NORVASC) 5 MG tablet TAKE 1 TABLET EVERY DAY (Patient taking differently: Take 5 mg by mouth daily.)   Bacillus Coagulans-Inulin (ALIGN PREBIOTIC-PROBIOTIC) 5-1.25 MG-GM CHEW Chew 1 tablet by mouth daily for 10 days.   Cholecalciferol (VITAMIN D-3 PO) Take 1 tablet by mouth 2 (two) times daily.   dapagliflozin propanediol (FARXIGA) 10 MG TABS tablet Take 1 tablet (10 mg total) by mouth daily before breakfast.   ezetimibe (ZETIA) 10 MG tablet TAKE 1 TABLET EVERY DAY (Patient taking differently: Take 10 mg by mouth every evening.)   furosemide (LASIX) 20 MG  tablet Take 20 mg by mouth 2 (two) times daily. Prescribed By Dr.Foster, Lawson Fiscal ([facility])   latanoprost (XALATAN) 0.005 % ophthalmic solution Place 1 drop into both eyes at bedtime.    losartan (COZAAR) 100 MG tablet TAKE 1 TABLET EVERY DAY (Patient taking differently: Take 100 mg by mouth every evening.)   Multiple Vitamins-Minerals (OCUVITE ADULT 50+) CAPS Take 1 capsule by mouth daily.   rosuvastatin (CRESTOR) 40 MG tablet TAKE 1 TABLET EVERY DAY (DISCONTINUE PRAVASTATIN) (Patient taking differently: Take 40 mg by mouth every evening.)   timolol  (BETIMOL) 0.5 % ophthalmic solution Place 1 drop into both eyes every morning.    triamcinolone 0.1% oint-Cerave equivalent lotion 1:1 mixture Apply topically 2 (two) times daily. (Patient taking differently: Apply 1 application  topically daily.)   No facility-administered encounter medications on file as of 12/03/2022.    Review of Systems:  Review of Systems  Constitutional:  Negative for activity change, appetite change and fever.  HENT:  Negative for sore throat.   Eyes: Negative.   Cardiovascular:  Negative for chest pain and leg swelling.  Gastrointestinal:  Negative for abdominal distention, diarrhea and vomiting.  Genitourinary:  Negative for dysuria, frequency and urgency.  Skin:  Negative for color change.  Neurological:  Negative for dizziness and headaches.  Psychiatric/Behavioral:  Negative for behavioral problems and sleep disturbance. The patient is not nervous/anxious.     Health Maintenance  Topic Date Due   Zoster Vaccines- Shingrix (1 of 2) Never done   INFLUENZA VACCINE  03/04/2023   Medicare Annual Wellness (AWV)  05/06/2023   DTaP/Tdap/Td (2 - Td or Tdap) 04/24/2029   Pneumonia Vaccine 53+ Years old  Completed   COVID-19 Vaccine  Completed   HPV VACCINES  Aged Out    Physical Exam: Vitals:   12/03/22 1425  BP: 138/62  Pulse: 65  Resp: 16  Temp: 97.9 F (36.6 C)  TempSrc: Temporal  SpO2: 98%  Weight: 152 lb 9.6 oz (69.2 kg)  Height: 5\' 9"  (1.753 m)   Body mass index is 22.54 kg/m. Physical Exam Constitutional:      Appearance: Normal appearance.  HENT:     Head: Normocephalic and atraumatic.     Mouth/Throat:     Mouth: Mucous membranes are moist.  Eyes:     Conjunctiva/sclera: Conjunctivae normal.  Cardiovascular:     Rate and Rhythm: Normal rate and regular rhythm.     Pulses: Normal pulses.     Heart sounds: Murmur heard.  Pulmonary:     Effort: Pulmonary effort is normal.     Breath sounds: Normal breath sounds.  Abdominal:      General: Bowel sounds are normal.     Palpations: Abdomen is soft.  Musculoskeletal:        General: Swelling present. Normal range of motion.     Cervical back: Normal range of motion.     Comments: Left arm 1+edema  Skin:    General: Skin is warm and dry.  Neurological:     General: No focal deficit present.     Mental Status: He is alert and oriented to person, place, and time.  Psychiatric:        Mood and Affect: Mood normal.        Behavior: Behavior normal.        Thought Content: Thought content normal.        Judgment: Judgment normal.     Labs reviewed: Basic Metabolic Panel: Recent Labs  11/12/22 0113 11/13/22 0534 11/14/22 0251 11/16/22 1111 11/18/22 0945  NA 141   < > 135 136 139  K 4.7   < > 4.4 4.4 4.2  CL 115*   < > 106 111 110  CO2 18*   < > 20* 17* 21  GLUCOSE 137*   < > 107* 137* 148*  BUN 59*   < > 28* 26* 25  CREATININE 1.93*   < > 1.56* 1.83* 1.61*  CALCIUM 7.8*   < > 7.9* 7.9* 8.3*  MG 2.1  --   --   --   --    < > = values in this interval not displayed.   Liver Function Tests: Recent Labs    07/14/22 0000 11/10/22 2138 11/16/22 1111 11/18/22 0945  AST  --  28 42* 24  ALT  --  26 29 21   ALKPHOS  --  46 60  --   BILITOT  --  0.2* 0.4 0.3  PROT  --  6.3* 5.5* 5.5*  ALBUMIN 4.3 3.1* 2.5*  --    No results for input(s): "LIPASE", "AMYLASE" in the last 8760 hours. No results for input(s): "AMMONIA" in the last 8760 hours. CBC: Recent Labs    11/12/22 0113 11/12/22 1345 11/14/22 0251 11/16/22 1111 11/18/22 0945  WBC 6.6   < > 11.1* 7.6 7.2  NEUTROABS 4.4  --   --  5.5 4,630  HGB 6.9*   < > 7.4* 8.0* 8.4*  HCT 21.1*   < > 23.1* 24.4* 26.1*  MCV 90.9   < > 91.3 92.1 91.9  PLT 144*   < > 133* 173 234   < > = values in this interval not displayed.   Lipid Panel: Recent Labs    01/13/22 1155  CHOL 105  HDL 45  LDLCALC 45  TRIG 70  CHOLHDL 2.3   Lab Results  Component Value Date   HGBA1C 11.6 07/14/2022    Procedures  since last visit: VAS [place] UPPER EXTREMITY VENOUS DUPLEX  Result Date: 11/25/2022 UPPER VENOUS STUDY  Patient Name:  DAILYN KEMPNER  Date of Exam:   11/25/2022 Medical Rec #: [id number]        Accession #:    [id number] Date of Birth: Sep 23, 1940        Patient Gender: M Patient Age:   82 years Exam Location:  [facility] Procedure:      VAS [place] UPPER EXTREMITY VENOUS DUPLEX Referring Phys: [facility] NGETICH --------------------------------------------------------------------------------  Indications: Edema Comparison Study: 11/16/2022 Left upper extremity venous duplex- no evidence of                   DVT, acute superficial vein thrombosis involving the cephalic                   vein in the left forearm. Performing Technologist: Gertie Fey MHA, RDMS, RVT, RDCS  Examination Guidelines: A complete evaluation includes B-mode imaging, spectral Doppler, color Doppler, and power Doppler as needed of all accessible portions of each vessel. Bilateral testing is considered an integral part of a complete examination. Limited examinations for reoccurring indications may be performed as noted.  Right Findings: +----------+------------+---------+-----------+----------+-------+ RIGHT     CompressiblePhasicitySpontaneousPropertiesSummary +----------+------------+---------+-----------+----------+-------+ Subclavian               Yes       Yes                      +----------+------------+---------+-----------+----------+-------+  Left Findings: +----------+------------+---------+-----------+----------+-------+ LEFT      CompressiblePhasicitySpontaneousPropertiesSummary +----------+------------+---------+-----------+----------+-------+ IJV           Full       Yes       Yes                      +----------+------------+---------+-----------+----------+-------+ Subclavian    Full       Yes       Yes                       +----------+------------+---------+-----------+----------+-------+ Axillary      Full       Yes       Yes                      +----------+------------+---------+-----------+----------+-------+ Brachial      Full       Yes       Yes                      +----------+------------+---------+-----------+----------+-------+ Radial        Full                                          +----------+------------+---------+-----------+----------+-------+ Ulnar         Full                                          +----------+------------+---------+-----------+----------+-------+ Cephalic      None                 No                Acute  +----------+------------+---------+-----------+----------+-------+ Basilic       Full                                          +----------+------------+---------+-----------+----------+-------+  Summary:  Right: No evidence of thrombosis in the subclavian.  Left: No evidence of deep vein thrombosis in the upper extremity. Findings consistent with acute superficial vein thrombosis involving the left cephalic vein. No significant change when compared to prior study.  *See table(s) above for measurements and observations.  Diagnosing physician: Lemar Livings MD Electronically signed by Lemar Livings MD on 11/25/2022 at 5:23:34 PM.    Final    VAS [place] UPPER EXTREMITY VENOUS DUPLEX  Result Date: 11/16/2022 UPPER VENOUS STUDY  Patient Name:  HULET EHRMANN  Date of Exam:   11/16/2022 Medical Rec #: [id number]        Accession #:    [id number] Date of Birth: 11-02-1940        Patient Gender: M Patient Age:   21 years Exam Location:  [facility] Procedure:      VAS [place] UPPER EXTREMITY VENOUS DUPLEX Referring Phys: LESLIE SOFIA --------------------------------------------------------------------------------  Indications: Swelling, Pain, redness left forearm due to multiple IV needle sticks., and SOB Performing Technologist: [place] Sink Sturdivant RDMS, RVT   Examination Guidelines: A complete evaluation includes B-mode imaging, spectral Doppler, color Doppler, and power Doppler as needed of all accessible portions of each vessel. Bilateral testing is considered an integral part of a  complete examination. Limited examinations for reoccurring indications may be performed as noted.  Right Findings: +----------+------------+---------+-----------+----------+-------+ RIGHT     CompressiblePhasicitySpontaneousPropertiesSummary +----------+------------+---------+-----------+----------+-------+ Subclavian               Yes       Yes                      +----------+------------+---------+-----------+----------+-------+  Left Findings: +----------+------------+---------+-----------+----------+-------+ LEFT      CompressiblePhasicitySpontaneousPropertiesSummary +----------+------------+---------+-----------+----------+-------+ IJV           Full       Yes       Yes                      +----------+------------+---------+-----------+----------+-------+ Subclavian    Full       Yes       Yes                      +----------+------------+---------+-----------+----------+-------+ Axillary      Full                                          +----------+------------+---------+-----------+----------+-------+ Brachial      Full                                          +----------+------------+---------+-----------+----------+-------+ Cephalic      None       No        No                Acute  +----------+------------+---------+-----------+----------+-------+ Basilic       Full                                          +----------+------------+---------+-----------+----------+-------+ Acute thrombus in the left proximal forearm extending distally.  Summary:  Right: No evidence of thrombosis in the subclavian.  Left: No evidence of deep vein thrombosis in the upper extremity. Findings consistent with acute superficial vein  thrombosis involving the left cephalic vein.  *See table(s) above for measurements and observations.  Diagnosing physician: Sherald Hess MD Electronically signed by Sherald Hess MD on 11/16/2022 at 4:40:11 PM.    Final    DG Chest 2 View  Result Date: 11/16/2022 CLINICAL DATA:  Shortness of breath. EXAM: CHEST - 2 VIEW COMPARISON:  November 10, 2022. FINDINGS: The heart size and mediastinal contours are within normal limits. Left lung is clear. Minimal right pleural effusion is noted. No definite consolidative process is noted. The visualized skeletal structures are unremarkable. IMPRESSION: Minimal right pleural effusion. Aortic Atherosclerosis (ICD10-I70.0). Electronically Signed   By: Lupita Raider M.D.   On: 11/16/2022 11:53   ECHOCARDIOGRAM COMPLETE  Result Date: 11/12/2022    ECHOCARDIOGRAM REPORT   Patient Name:   [name] Date of Exam: 11/12/2022 Medical Rec #:  [id number]       Height:       69.0 in Accession #:    [id number]      Weight:       158.1 lb Date of Birth:  1940-09-15       BSA:  1.869 m Patient Age:    81 years        BP:           145/66 mmHg Patient Gender: M               HR:           65 bpm. Exam Location:  Inpatient Procedure: 2D Echo, Cardiac Doppler and Color Doppler Indications:    Elevated Troponin  History:        Patient has prior history of Echocardiogram examinations, most                 recent 06/01/2021. Risk Factors:Hypertension and Dyslipidemia.                 CKD, stage 3.  Sonographer:    Lucendia Herrlich Referring Phys: [id number] FANG XU IMPRESSIONS  1. Left ventricular ejection fraction, by estimation, is 60 to 65%. The left ventricle has normal function. The left ventricle has no regional wall motion abnormalities. There is moderate asymmetric left ventricular hypertrophy of the infero-lateral segment. Left ventricular diastolic parameters are consistent with Grade I diastolic dysfunction (impaired relaxation). Elevated left ventricular  end-diastolic pressure.  2. Right ventricular systolic function is normal. The right ventricular size is normal. There is mildly elevated pulmonary artery systolic pressure. The estimated right ventricular systolic pressure is 36.2 mmHg.  3. The mitral valve is degenerative. Trivial mitral valve regurgitation. No evidence of mitral stenosis.  4. The aortic valve is calcified. Aortic valve regurgitation is mild. Severe aortic valve stenosis. Aortic valve area, by VTI measures 0.87 cm. Aortic valve mean gradient measures 51.0 mmHg. Aortic valve Vmax measures 4.56 m/s.  5. 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 There is borderline dilatation of the aortic root, measuring 37 mm.  6. The inferior vena cava is normal in size with greater than 50% respiratory variability, suggesting right atrial pressure of 3 mmHg. FINDINGS  Left Ventricle: Left ventricular ejection fraction, by estimation, is 60 to 65%. The left ventricle has normal function. The left ventricle has no regional wall motion abnormalities. The left ventricular internal cavity size was normal in size. There is  moderate asymmetric left ventricular hypertrophy of the infero-lateral segment. Left ventricular diastolic parameters are consistent with Grade I diastolic dysfunction (impaired relaxation). Elevated left ventricular end-diastolic pressure. Right Ventricle: The right ventricular size is normal. No increase in right ventricular wall thickness. Right ventricular systolic function is normal. There is mildly elevated pulmonary artery systolic pressure. The tricuspid regurgitant velocity is 2.88  m/s, and with an assumed right atrial pressure of 3 mmHg, the estimated right ventricular systolic pressure is 36.2 mmHg. Left Atrium: Left atrial size was normal in size. Right Atrium: Right atrial size was normal in size. Pericardium: There is no evidence of pericardial effusion. Mitral Valve: The mitral valve is degenerative in  appearance. There is mild thickening of the mitral valve leaflet(s). There is mild calcification of the mitral valve leaflet(s). Mild mitral annular calcification. Trivial mitral valve regurgitation. No evidence of mitral valve stenosis. Tricuspid Valve: The tricuspid valve is normal in structure. Tricuspid valve regurgitation is mild . No evidence of tricuspid stenosis. Aortic Valve: The aortic valve is calcified. Aortic valve regurgitation is mild. Severe aortic stenosis is present. Aortic valve mean gradient measures 51.0 mmHg. Aortic valve peak gradient measures 83.2 mmHg. Aortic valve area, by VTI measures 0.87 cm. Pulmonic Valve: The pulmonic valve was normal in structure. Pulmonic valve regurgitation  is trivial. No evidence of pulmonic stenosis. Aorta: The aortic root is normal in size and structure. 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 There is borderline dilatation of the aortic root, measuring 37 mm. Venous: The inferior vena cava is normal in size with greater than 50% respiratory variability, suggesting right atrial pressure of 3 mmHg. IAS/Shunts: No atrial level shunt detected by color flow Doppler.  LEFT VENTRICLE PLAX 2D LVIDd:         4.90 cm   Diastology LVIDs:         3.50 cm   LV e' medial:    6.31 cm/s LV PW:         1.60 cm   LV E/e' medial:  15.1 LV IVS:        0.90 cm   LV e' lateral:   4.68 cm/s LVOT diam:     2.30 cm   LV E/e' lateral: 20.4 LV SV:         101 LV SV Index:   54 LVOT Area:     4.15 cm  RIGHT VENTRICLE             IVC RV S prime:     12.30 cm/s  IVC diam: 1.80 cm LEFT ATRIUM             Index        RIGHT ATRIUM           Index LA diam:        4.20 cm 2.25 cm/m   RA Area:     16.90 cm LA Vol (A2C):   59.0 ml 31.56 ml/m  RA Volume:   38.10 ml  20.38 ml/m LA Vol (A4C):   60.1 ml 32.15 ml/m LA Biplane Vol: 62.7 ml 33.54 ml/m  AORTIC VALVE AV Area (Vmax):    1.03 cm AV Area (Vmean):   0.91 cm AV Area (VTI):     0.87 cm AV Vmax:            456.00 cm/s AV Vmean:          339.000 cm/s AV VTI:            1.150 m AV Peak Grad:      83.2 mmHg AV Mean Grad:      51.0 mmHg LVOT Vmax:         113.00 cm/s LVOT Vmean:        74.233 cm/s LVOT VTI:          0.242 m LVOT/AV VTI ratio: 0.21  AORTA Ao Root diam: 3.70 cm Ao Asc diam:  4.40 cm MITRAL VALVE                TRICUSPID VALVE MV Area (PHT): 2.95 cm     TR Peak grad:   33.2 mmHg MV Decel Time: 257 msec     TR Vmax:        288.00 cm/s MV E velocity: 95.40 cm/s MV A velocity: 127.00 cm/s  SHUNTS MV E/A ratio:  0.75         Systemic VTI:  0.24 m                             Systemic Diam: 2.30 cm Armanda Magic MD Electronically signed by Armanda Magic MD Signature Date/Time: 11/12/2022/3:35:02 PM    Final    CT ABDOMEN PELVIS WO CONTRAST  Result Date:  11/11/2022 CLINICAL DATA:  Abdominal pain, vomiting EXAM: CT ABDOMEN AND PELVIS WITHOUT CONTRAST TECHNIQUE: Multidetector CT imaging of the abdomen and pelvis was performed following the standard protocol without IV contrast. RADIATION DOSE REDUCTION: This exam was performed according to the departmental dose-optimization program which includes automated exposure control, adjustment of the mA and/or kV according to patient size and/or use of iterative reconstruction technique. COMPARISON:  None Available. FINDINGS: Lower chest: No acute abnormality Hepatobiliary: No focal hepatic abnormality. Gallbladder unremarkable. Pancreas: No focal abnormality or ductal dilatation. Spleen: No focal abnormality.  Normal size. Adrenals/Urinary Tract: No adrenal abnormality. No focal renal abnormality. No stones or hydronephrosis. Urinary bladder is unremarkable. Stomach/Bowel: Diffuse colonic diverticulosis. No active diverticulitis. Stomach and small bowel grossly unremarkable. No bowel obstruction. Vascular/Lymphatic: Diffuse aortoiliac atherosclerosis. No evidence of aneurysm or adenopathy. Reproductive: Markedly enlarged prostate with a transverse diameter of 6.4 cm.  Other: No free fluid or free air. Musculoskeletal: No acute bony abnormality. IMPRESSION: Diffuse colonic diverticulosis.  No active diverticulitis. No evidence of bowel obstruction. Aortoiliac atherosclerosis. Prostate enlargement. Electronically Signed   By: Charlett Nose M.D.   On: 11/11/2022 03:33   DG Chest Portable 1 View  Result Date: 11/10/2022 CLINICAL DATA:  Shortness of breath EXAM: PORTABLE CHEST 1 VIEW COMPARISON:  11/29/2019 FINDINGS: No acute airspace disease. Stable cardiomediastinal silhouette with aortic atherosclerosis. No pneumothorax. IMPRESSION: No active disease. Electronically Signed   By: Jasmine Pang M.D.   On: 11/10/2022 22:14    Assessment/Plan  1. Superficial thrombophlebitis of left upper extremity -  continue warm compress - VAS [place] UPPER EXTREMITY VENOUS DUPLEX; Future - CBC With Differential/Platelet  2. Essential hypertension -  BP 138/62, stable -  continue Amlodipine - Basic Metabolic Panel with eGFR  3. Acute blood loss anemia Lab Results  Component Value Date   WBC 7.2 11/18/2022   HGB 8.4 (L) 11/18/2022   HCT 26.1 (L) 11/18/2022   MCV 91.9 11/18/2022   PLT 234 11/18/2022     -  re-check CBC   Labs/tests ordered:   CBC and BMP  Next appt:  12/23/2022

## 2022-12-03 NOTE — Addendum Note (Signed)
Addended by: Kenard Gower C on: 12/03/2022 11:23 PM   Modules accepted: Orders

## 2022-12-03 NOTE — Telephone Encounter (Signed)
Follow up appointment scheduled for 12/03/22 with Kenard Gower, NP

## 2022-12-04 LAB — BASIC METABOLIC PANEL WITH GFR
BUN/Creatinine Ratio: 24 (calc) — ABNORMAL HIGH (ref 6–22)
BUN: 38 mg/dL — ABNORMAL HIGH (ref 7–25)
CO2: 24 mmol/L (ref 20–32)
Calcium: 9.7 mg/dL (ref 8.6–10.3)
Chloride: 106 mmol/L (ref 98–110)
Creat: 1.58 mg/dL — ABNORMAL HIGH (ref 0.70–1.22)
Glucose, Bld: 87 mg/dL (ref 65–99)
Potassium: 4.7 mmol/L (ref 3.5–5.3)
Sodium: 139 mmol/L (ref 135–146)
eGFR: 44 mL/min/{1.73_m2} — ABNORMAL LOW (ref >=60)

## 2022-12-04 LAB — CBC WITH DIFFERENTIAL/PLATELET
Basophils Absolute: 20 cells/uL (ref 0–200)
Basophils Relative: 0.5 %
Eosinophils Absolute: 268 cells/uL (ref 15–500)
Eosinophils Relative: 6.7 %
HCT: 32.8 % — ABNORMAL LOW (ref 38.5–50.0)
Hemoglobin: 10.2 g/dL — ABNORMAL LOW (ref 13.2–17.1)
Lymphs Abs: 1068 cells/uL (ref 850–3900)
MCH: 29.5 pg (ref 27.0–33.0)
MCHC: 31.1 g/dL — ABNORMAL LOW (ref 32.0–36.0)
MCV: 94.8 fL (ref 80.0–100.0)
Neutro Abs: 2100 cells/uL (ref 1500–7800)
Platelets: 345 10*3/uL (ref 140–400)
RDW: 14.7 % (ref 11.0–15.0)
Total Lymphocyte: 26.7 %

## 2022-12-04 NOTE — Progress Notes (Signed)
-    kidney function is stable and ranging in chronic kidney disease stage 3B -  hgb 10.2, improved from 8.4 -  did anybody call yet for the left arm doppler venous ultrasound?

## 2022-12-07 ENCOUNTER — Ambulatory Visit (HOSPITAL_COMMUNITY)
Admission: RE | Admit: 2022-12-07 | Discharge: 2022-12-07 | Disposition: A | Discharge status: Home / Self Care | Payer: Medicare HMO | Source: Ambulatory Visit | Attending: Cardiology | Admitting: Cardiology

## 2022-12-07 DIAGNOSIS — I808 Phlebitis and thrombophlebitis of other sites: Secondary | ICD-10-CM | POA: Diagnosis not present

## 2022-12-09 ENCOUNTER — Encounter: Payer: Self-pay | Admitting: Cardiology

## 2022-12-09 ENCOUNTER — Ambulatory Visit: Payer: Medicare HMO | Admitting: Cardiology

## 2022-12-09 VITALS — BP 130/54 | HR 63 | Resp 16 | Ht 69.0 in | Wt 152.0 lb

## 2022-12-09 DIAGNOSIS — I359 Nonrheumatic aortic valve disorder, unspecified: Secondary | ICD-10-CM

## 2022-12-09 DIAGNOSIS — I10 Essential (primary) hypertension: Secondary | ICD-10-CM

## 2022-12-09 DIAGNOSIS — I779 Disorder of arteries and arterioles, unspecified: Secondary | ICD-10-CM | POA: Diagnosis not present

## 2022-12-09 DIAGNOSIS — E7800 Pure hypercholesterolemia, unspecified: Secondary | ICD-10-CM

## 2022-12-09 NOTE — Progress Notes (Addendum)
Primary Physician/Referring:  Frederica Kuster, MD  Patient ID: [name], male    DOB: [birth year], 82 y.o.   MRN: [id number]  Chief Complaint  Patient presents with   Moderate aortic stenosis   Essential hypertension   Follow-up    HPI: [name]  is a 82 y.o. 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  Patient had hospitalization from 4/9 through 11/14/2022 with acute GI bleed from bleeding colonic angiodysplasia, he also had colonic polyps and external hemorrhoids.  Received 5 units of packed RBCs.  Echocardiogram performed 11/09/2022 elevated severe aortic stenosis.  He was into the emergency room on 11/16/2022 and diagnosed with left upper extremity superficial thrombophlebitis. He is accompanied by his God daughter [name].  Presently his activity level has decreased since hospitalization due to generalized weakness.  Continues to have bilateral calf claudication with activity.  No dizziness or syncope.  Denies chest pain.  Denies dyspnea.  Has been compliant with all his medications. He is accompanied by his God daughter [name].  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)    Pure hypercholesterolemia    Unspecified vitamin D  deficiency     Past Surgical History:  Procedure Laterality Date   CATARACT EXTRACTION     COLONOSCOPY WITH PROPOFOL N/A 11/12/2022   Procedure: COLONOSCOPY WITH PROPOFOL;  Surgeon: Benancio Deeds, MD;  Location: [facility] ENDOSCOPY;  Service: Gastroenterology;  Laterality: N/A;   EYE SURGERY     left eye,    cornea repair   HOT HEMOSTASIS N/A 11/12/2022   Procedure: HOT HEMOSTASIS (ARGON PLASMA COAGULATION/BICAP);  Surgeon: Benancio Deeds, MD;  Location: [facility] ENDOSCOPY;  Service: Gastroenterology;  Laterality: N/A;   PERIPHERAL VASCULAR CATHETERIZATION N/A 07/28/2016   Procedure: Lower Extremity Angiography;  Surgeon: Yates Decamp, MD;  Location: [facility] INVASIVE CV LAB;  Service: Cardiovascular;  Laterality: N/A;   Social History   Tobacco Use   Smoking status: Former    Packs/day: 0.50    Years: 13.00    Additional pack years: 0.00    Total pack years: 6.50    Types: Cigarettes    Quit date: 03/09/1971    Years since quitting: 51.7   Smokeless tobacco: Never  Substance Use Topics   Alcohol use: No    Alcohol/week: 0.0 standard drinks of alcohol    Marital Status: Widowed   Review of Systems  Cardiovascular:  Positive for claudication. Negative for chest pain, dyspnea on exertion and leg swelling.  Neurological:  Positive for focal weakness (right hemiparesis).   Objective  Blood pressure (!) 130/54, pulse 63, resp. rate 16, height 5\' 9"  (1.753 m), weight 152 lb (68.9 kg), SpO2 97 %. Body mass index is 22.45 kg/m.     12/09/2022  1:51 PM 12/09/2022    1:19 PM 12/03/2022    2:25 PM  Vitals with BMI  Height  5\' 9"  5\' 9"   Weight  152 lbs 152 lbs 10 oz  BMI  22.44 22.52  Systolic 130 159 161  Diastolic 54 54 62  Pulse  63 65       Physical Exam Constitutional:      Appearance: He is well-developed.  Neck:     Thyroid: No thyromegaly.     Vascular: No JVD.  Cardiovascular:     Rate and Rhythm: Normal rate and regular rhythm.     Pulses:          Carotid pulses are  on  the right side with bruit and  on the left side with bruit.      Femoral pulses are 2+ on the right side and 2+ on the left side.      Popliteal pulses are 1+ on the right side and 2+ on the left side.       Dorsalis pedis pulses are 0 on the right side and 0 on the left side.       Posterior tibial pulses are 0 on the right side and 0 on the left side.     Heart sounds: S1 normal and S2 normal. Murmur heard.     Early systolic murmur is present with a grade of 2/6 at the upper right sternal border radiating to the neck.     High-pitched blowing decrescendo early diastolic murmur is present with a grade of 3/4 at the upper right sternal border radiating to the apex.     No gallop.  Pulmonary:     Effort: Pulmonary effort is normal.     Breath sounds: Normal breath sounds.  Abdominal:     General: Bowel sounds are normal.     Palpations: Abdomen is soft.  Musculoskeletal:     Right lower leg: No edema (Bilateral legs loss of hair.).     Left lower leg: No edema (Bilateral legs loss of hair.).     Laboratory examination:      Latest Ref Rng & Units 12/03/2022    3:04 PM 11/18/2022    9:45 AM 11/16/2022   11:11 AM  CMP  Glucose 65 - 99 mg/dL 87  096  045   BUN 7 - 25 mg/dL 38  25  26   Creatinine 0.70 - 1.22 mg/dL 4.09  8.11  9.14   Sodium 135 - 146 mmol/L 139  139  136   Potassium 3.5 - 5.3 mmol/L 4.7  4.2  4.4   Chloride 98 - 110 mmol/L 106  110  111   CO2 20 - 32 mmol/L 24  21  17    Calcium 8.6 - 10.3 mg/dL 9.7  8.3  7.9   Total Protein 6.1 - 8.1 g/dL  5.5  5.5   Total Bilirubin 0.2 - 1.2 mg/dL  0.3  0.4   Alkaline Phos 38 - 126 U/L   60   AST 10 - 35 U/L  24  42   ALT 9 - 46 U/L  21  29       Latest Ref Rng & Units 12/03/2022    3:04 PM 11/18/2022    9:45 AM 11/16/2022   11:11 AM  CBC  WBC 3.8 - 10.8 Thousand/uL 4.0  7.2  7.6   Hemoglobin 13.2 - 17.1 g/dL 78.2  8.4  8.0   Hematocrit 38.5 - 50.0 %  32.8  26.1  24.4   Platelets 140 - 400 Thousand/uL 345  234  173    Lipid  Panel  Recent Labs    01/13/22 1155  CHOL 105  TRIG 70  LDLCALC 45  HDL 45  CHOLHDL 2.3    HEMOGLOBIN A1C Lab Results  Component Value Date   HGBA1C 11.6 07/14/2022   MPG 131 01/13/2022   TSH No results for input(s): "TSH" in the last 8760 hours.   Radiology: CT scan of the head without contrast: 11/29/2019: No acute intracranial abnormality. Findings consistent with age related atrophy and chronic small vessel ischemia The area of encephalomalacia involving the right frontoparietal lobe  CXR 11/28/2009: There is no evidence of acute infiltrate, pleural effusion or pneumothorax. The cardiac silhouette is mildly enlarged. There is mild calcification of the thoracic aorta. The visualized skeletal structures are unremarkable.  Cardiac Studies:   Peripheral arteriogram 07/28/2016: Moderate diffuse SFA disease. Right below knee severe diffuse disease with PT run-off. Left PT and Peroneal run-off, diffuse disease  Carotid Doppler   08/12/2017: Mild stenosis in the right internal carotid artery (1-15%). Minimal stenosis in the right common carotid artery (<50%). Antegrade right vertebral artery flow. Bidirectional left vertebral artery flow. May suggest proximal left subclavian stenosis. Compared to the study done on 08/12/2016, right ICA stenosis of less than 50% no longer present. Further studies when clinically indicated.  Lexiscan Tetrofosmin stress test 12/20/2019: No previous exam available for comparison. Lexiscan nuclear stress test performed using 1-day protocol. Stress EKG is non-diagnostic, as this is pharmacological stress test. Rest and stress EKG shows sinus rhythm, RBBB, T wave inversion inferolateral leads, occasional PAC. SPECT images show small sized, mild intensity, mildly reversible perfusion defect in mid to basal inferior myocardium. Stress LVEF calculated 44%, although visually appears normal. No regional abnormalities of myocardial thickening noted. Low  risk study.   ECHOCARDIOGRAM 11/12/2022: 1. Left ventricular ejection fraction, by estimation, is 60 to 65%. The left ventricle has normal function. The left ventricle has no regional wall motion abnormalities. There is moderate asymmetric left ventricular hypertrophy of the infero-lateral  segment. Left ventricular diastolic parameters are consistent with Grade I diastolic dysfunction (impaired relaxation). Elevated left ventricular end-diastolic pressure.  2. Right ventricular systolic function is normal. The right ventricular size is normal. There is mildly elevated pulmonary artery systolic pressure. The estimated right ventricular systolic pressure is 36.2 mmHg.  3. The mitral valve is degenerative. Trivial mitral valve regurgitation. No evidence of mitral stenosis.  4. The aortic valve is calcified. Aortic valve regurgitation is mild. Severe aortic valve stenosis. Aortic valve area, by VTI measures 0.87 cm. Aortic valve mean gradient measures 51.0 mmHg. Aortic valve Vmax measures 4.56 m/s.  5. 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 There is borderline dilatation of the aortic root, measuring 37 mm.  6. The inferior vena cava is normal in size with greater than 50% respiratory variability, suggesting right atrial pressure of 3 mmHg. Compared to prior studies from 2021 and 2022, mean gradient has increased from around 35 mmHg.  EKG:  EKG 12/09/2022: Ectopic atrial rhythm at the rate of 60 bpm, right axis deviation, right bundle branch block.  LVH by voltage.  T wave abnormality, consider inferior ischemia.  Compared to 04/20/2022, sinus rhythm has been replaced by ectopic atrial rhythm, left axis deviation is now right axis.   Allergies & Medications:   Allergies  Allergen Reactions   Penicillins Other (See Comments)    Unknown  reaction    Current Outpatient Medications:    amLODipine (NORVASC) 5 MG tablet, TAKE 1 TABLET EVERY DAY (Patient taking differently:  Take 5 mg by mouth daily.), Disp: 90 tablet, Rfl: 3   Cholecalciferol (VITAMIN D-3 PO), Take 1 tablet by mouth 2 (two) times daily., Disp: , Rfl:    dapagliflozin propanediol (FARXIGA) 10 MG TABS tablet, Take 1 tablet (10 mg total) by mouth daily before breakfast., Disp: 90 tablet, Rfl: 1   ezetimibe (ZETIA) 10 MG tablet, TAKE 1 TABLET EVERY DAY (Patient taking differently: Take 10 mg by mouth every evening.), Disp: 90 tablet, Rfl: 3   furosemide (LASIX) 20 MG tablet, Take 20 mg by mouth 2 (two) times daily. Prescribed By Dr.Foster, Lawson Fiscal [facility]), Disp: , Rfl:    latanoprost (XALATAN) 0.005 % ophthalmic solution, Place 1 drop into both eyes at bedtime. , Disp: , Rfl:    losartan (COZAAR) 100 MG tablet, TAKE 1 TABLET EVERY DAY (Patient taking differently: Take 100 mg by mouth every evening.), Disp: 90 tablet, Rfl: 3   Multiple Vitamins-Minerals (OCUVITE ADULT 50+) CAPS, Take 1 capsule by mouth daily., Disp: , Rfl:    rosuvastatin (CRESTOR) 40 MG tablet, TAKE 1 TABLET EVERY DAY (DISCONTINUE PRAVASTATIN) (Patient taking differently: Take 40 mg by mouth every evening.), Disp: 90 tablet, Rfl: 10   timolol (BETIMOL) 0.5 % ophthalmic solution, Place 1 drop into both eyes every morning. , Disp: , Rfl:    triamcinolone 0.1% oint-Cerave equivalent lotion 1:1 mixture, Apply topically 2 (two) times daily. (Patient taking differently: Apply 1 application  topically daily.), Disp: 454 g, Rfl: 1   Assessment     ICD-10-CM   1. Aortic valve disorder  I35.9     2. PAOD (peripheral arterial occlusive disease) (HCC)  I77.9 EKG 12-Lead    3. Primary hypertension  I10     4. Hypercholesteremia  E78.00      There are no discontinued medications.  No orders of the defined types were placed in this encounter.  Orders Placed This Encounter  Procedures   EKG 12-Lead    Recommendations:   David "[name]"  [name] is a 82 y.o. 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  Patient had hospitalization from 4/9 through 11/14/2022 with acute GI bleed from bleeding colonic angiodysplasia, he also had colonic polyps and external hemorrhoids.  Received 5 A of packed RBCs.  Echocardiogram performed 11/09/2022 elevated severe aortic stenosis.  He was into the emergency room on 11/16/2022 and diagnosed with left upper extremity superficial thrombophlebitis. He is accompanied by his God daughter [name].  1. Aortic valve disorder I reviewed the echocardiogram from the hospitalization. If his angiodysplasia and colonic polyps could be related to aortic stenosis, then I can plan on TAVR in 3-4 months as he will need DAPT with asa and plavix.  Discussed with Dr. Adela Lank regarding this.  Recent echo showed progression of AS, but clinically he is doing well from Cardiac standpoint. Also elevated velocity could be due to anemia.  I reviewed his recent ED visit, CBC, his hemoglobin has nicely trended upwards.   2. PAOD (peripheral arterial occlusive disease) (HCC) Chronic symptoms of claudication in bilateral calves but also bilateral leg pain is related to spinal stenosis as well.  As long as  there is no limb threatening ischemia, rest pain, ulceration, continued medical therapy.  Due to recent GI bleed, he is presently off of aspirin, I will plan on restarting this on his next office visit in 3 months.  - EKG 12-Lead  3. Primary hypertension Blood pressure is well-controlled on present medical regimen.  Renal function is stable.  4. Hypercholesteremia Lipids in excellent control.  Continue present statin dose.  This was a 40-minute office visit encounter.   Yates Decamp, MD, [facility] 12/09/2022, 3:37 PM Office: [phone]    CC: Dr. Ileene Patrick  Addendum: 12/11/2022: I discussed personally with Dr. Ileene Patrick, he is colonic polyps, AVMs not enough to explain aortic stenosis as etiology as it was just 1 bleeding polyp.  From GI standpoint, if aortic valve replacement is indicated, no contraindication to proceed with the same.  For now we will continue observation.

## 2022-12-14 NOTE — Progress Notes (Signed)
-    result showed no evidence of DVT, showed old superficial thrombus.

## 2022-12-16 DIAGNOSIS — R7303 Prediabetes: Secondary | ICD-10-CM | POA: Diagnosis not present

## 2022-12-16 DIAGNOSIS — I5032 Chronic diastolic (congestive) heart failure: Secondary | ICD-10-CM | POA: Diagnosis not present

## 2022-12-16 DIAGNOSIS — I130 Hypertensive heart and chronic kidney disease with heart failure and stage 1 through stage 4 chronic kidney disease, or unspecified chronic kidney disease: Secondary | ICD-10-CM | POA: Diagnosis not present

## 2022-12-16 DIAGNOSIS — N1832 Chronic kidney disease, stage 3b: Secondary | ICD-10-CM | POA: Diagnosis not present

## 2022-12-16 DIAGNOSIS — D509 Iron deficiency anemia, unspecified: Secondary | ICD-10-CM | POA: Diagnosis not present

## 2022-12-17 DIAGNOSIS — N1832 Chronic kidney disease, stage 3b: Secondary | ICD-10-CM | POA: Diagnosis not present

## 2022-12-23 ENCOUNTER — Encounter: Payer: Self-pay | Admitting: Family Medicine

## 2022-12-23 ENCOUNTER — Ambulatory Visit (INDEPENDENT_AMBULATORY_CARE_PROVIDER_SITE_OTHER): Payer: Medicare HMO | Admitting: Family Medicine

## 2022-12-23 VITALS — BP 132/58 | HR 80 | Temp 97.6°F | Ht 69.0 in | Wt 156.4 lb

## 2022-12-23 DIAGNOSIS — D62 Acute posthemorrhagic anemia: Secondary | ICD-10-CM

## 2022-12-23 DIAGNOSIS — E7800 Pure hypercholesterolemia, unspecified: Secondary | ICD-10-CM

## 2022-12-23 DIAGNOSIS — L853 Xerosis cutis: Secondary | ICD-10-CM | POA: Diagnosis not present

## 2022-12-23 LAB — CBC
Hemoglobin: 10.8 g/dL — ABNORMAL LOW (ref 13.2–17.1)
Platelets: 206 10*3/uL (ref 140–400)
RBC: 3.64 10*6/uL — ABNORMAL LOW (ref 4.20–5.80)
RDW: 14.1 % (ref 11.0–15.0)

## 2022-12-23 MED ORDER — CLOBETASOL PROPIONATE 0.05 % EX GEL
1.0000 | Freq: Two times a day (BID) | CUTANEOUS | 1 refills | Status: DC
Start: 2022-12-23 — End: 2023-08-12

## 2022-12-23 NOTE — Progress Notes (Signed)
Provider:  Jacalyn Lefevre, MD  Careteam: Patient Care Team: Frederica Kuster, MD as PCP - General (Family Medicine) Yates Decamp, MD as Consulting Physician (Cardiology) Ernesto Rutherford, MD as Consulting Physician (Ophthalmology) Pryor Ochoa, MD (Inactive) as Consulting Physician (Vascular Surgery) Micki Riley, MD as Consulting Physician (Neurology) Sherrie [name], MD as Consulting Physician (Ophthalmology)  PLACE OF SERVICE:  [facility]  Advanced Directive information    Allergies  Allergen Reactions   Penicillins Other (See Comments)    Unknown reaction    Chief Complaint  Patient presents with   Medical Management of Chronic Issues    Patient presents today for a 5 weeks follow-up   Quality Metric Gaps    [place]     HPI: Patient is a 82 y.o. male patient is here to follow-up DVT and left arm.  This occurred after he had IV.  Symptoms have pretty much resolved without treatment as I explained to clots generally do dissolve through fibrinolytic mechanisms.  We do not need to keep repeating ultrasound until totally resolved.  You He also complains today of some rash that is chronic there are changes on the left upper leg and hip has been using triamcinolone cream  Review of Systems:  Review of Systems  Respiratory: Negative.    Cardiovascular: Negative.   Skin:  Positive for rash.  Neurological: Negative.   Psychiatric/Behavioral: Negative.    All other systems reviewed and are negative.   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   Hyperlipidemia    Hypertension    Hypertensive kidney disease, benign    Hypertrophy of prostate with  urinary obstruction and other lower urinary tract symptoms (LUTS)    Pure hypercholesterolemia    Unspecified vitamin D deficiency    Past Surgical History:  Procedure Laterality Date   CATARACT EXTRACTION     COLONOSCOPY WITH PROPOFOL N/A 11/12/2022   Procedure: COLONOSCOPY WITH PROPOFOL;  Surgeon: Benancio Deeds, MD;  Location: [facility] ENDOSCOPY;  Service: Gastroenterology;  Laterality: N/A;   EYE SURGERY     left eye,    cornea repair   HOT HEMOSTASIS N/A 11/12/2022   Procedure: HOT HEMOSTASIS (ARGON PLASMA COAGULATION/BICAP);  Surgeon: Benancio Deeds, MD;  Location: [facility] ENDOSCOPY;  Service: Gastroenterology;  Laterality: N/A;   PERIPHERAL VASCULAR CATHETERIZATION N/A 07/28/2016   Procedure: Lower Extremity Angiography;  Surgeon: Yates Decamp, MD;  Location: [facility] INVASIVE CV LAB;  Service: Cardiovascular;  Laterality: N/A;   Social History:   reports that he quit smoking about 51 years ago. His smoking use included cigarettes. He has a 6.50 pack-year smoking history. He has never used smokeless tobacco. He reports that he does not drink alcohol and does not use drugs.  Family History  Problem Relation Age of Onset   Cancer Father     Medications: Patient's Medications  New Prescriptions   CLOBETASOL (TEMOVATE) 0.05 % GEL    Apply 1 Application topically 2 (two) times daily.  Previous Medications   AMLODIPINE (NORVASC) 5 MG TABLET    TAKE 1 TABLET EVERY DAY   CHOLECALCIFEROL (VITAMIN D-3 PO)    Take 1 tablet  by mouth 2 (two) times daily.   DAPAGLIFLOZIN PROPANEDIOL (FARXIGA) 10 MG TABS TABLET    Take 1 tablet (10 mg total) by mouth daily before breakfast.   EZETIMIBE (ZETIA) 10 MG TABLET    TAKE 1 TABLET EVERY DAY   FUROSEMIDE (LASIX) 20 MG TABLET    Take 20 mg by mouth 2 (two) times daily. Prescribed By Dr.Foster, Lawson Fiscal ([place])   LATANOPROST (XALATAN) 0.005 % OPHTHALMIC SOLUTION    Place 1 drop into both eyes at bedtime.    LOSARTAN (COZAAR) 100 MG TABLET     TAKE 1 TABLET EVERY DAY   MULTIPLE VITAMINS-MINERALS (OCUVITE ADULT 50+) CAPS    Take 1 capsule by mouth daily.   ROSUVASTATIN (CRESTOR) 40 MG TABLET    TAKE 1 TABLET EVERY DAY (DISCONTINUE PRAVASTATIN)   TIMOLOL (BETIMOL) 0.5 % OPHTHALMIC SOLUTION    Place 1 drop into both eyes every morning.    TRIAMCINOLONE 0.1% OINT-CERAVE EQUIVALENT LOTION 1:1 MIXTURE    Apply topically 2 (two) times daily.  Modified Medications   No medications on file  Discontinued Medications   No medications on file    Physical Exam:  Vitals:   12/23/22 0944  BP: (!) 132/58  Pulse: 80  Temp: 97.6 F (36.4 C)  SpO2: 96%  Weight: 156 lb 6.4 oz (70.9 kg)  Height: 5\' 9"  (1.753 m)   Body mass index is 23.1 kg/m. Wt Readings from Last 3 Encounters:  12/23/22 156 lb 6.4 oz (70.9 kg)  12/09/22 152 lb (68.9 kg)  12/03/22 152 lb 9.6 oz (69.2 kg)    Physical Exam Vitals and nursing note reviewed.  Constitutional:      Appearance: Normal appearance.  Cardiovascular:     Rate and Rhythm: Normal rate.  Pulmonary:     Effort: Pulmonary effort is normal.  Skin:    Findings: Rash present.     Comments: Left upper leg and hip.  Will refer to dermatology but while waiting use stronger for needed steroid  Neurological:     Mental Status: He is alert.     Labs reviewed: Basic Metabolic Panel: Recent Labs    11/12/22 0113 11/13/22 0534 11/16/22 1111 11/18/22 0945 12/03/22 1504  NA 141   < > 136 139 139  K 4.7   < > 4.4 4.2 4.7  CL 115*   < > 111 110 106  CO2 18*   < > 17* 21 24  GLUCOSE 137*   < > 137* 148* 87  BUN 59*   < > 26* 25 38*  CREATININE 1.93*   < > 1.83* 1.61* 1.58*  CALCIUM 7.8*   < > 7.9* 8.3* 9.7  MG 2.1  --   --   --   --    < > = values in this interval not displayed.   Liver Function Tests: Recent Labs    07/14/22 0000 11/10/22 2138 11/16/22 1111 11/18/22 0945  AST  --  28 42* 24  ALT  --  26 29 21   ALKPHOS  --  46 60  --   BILITOT  --  0.2* 0.4 0.3  PROT  --  6.3*  5.5* 5.5*  ALBUMIN 4.3 3.1* 2.5*  --    No results for input(s): "LIPASE", "AMYLASE" in the last 8760 hours. No results for input(s): "AMMONIA" in the last 8760 hours. CBC: Recent Labs    11/16/22 1111 11/18/22 0945 12/03/22 1504  WBC 7.6 7.2 4.0  NEUTROABS 5.5 4,630  2,100  HGB 8.0* 8.4* 10.2*  HCT 24.4* 26.1* 32.8*  MCV 92.1 91.9 94.8  PLT 173 234 345   Lipid Panel: Recent Labs    01/13/22 1155  CHOL 105  HDL 45  LDLCALC 45  TRIG 70  CHOLHDL 2.3   TSH: No results for input(s): "TSH" in the last 8760 hours. A1C: Lab Results  Component Value Date   HGBA1C 11.6 07/14/2022     Assessment/Plan  1. Dry skin dermatitis We will move up and potency on topical applications from triamcinolone to clobetasol gel and refer to dermatology  2. Acute blood loss anemia Last hemoglobin was in excess of 10.  He has been taking iron.  Would like to see if there has been further improvement.  He has had no further bleeding  3. Pure hypercholesterolemia Has been almost a year since last check of lipids so while he is here for appointment today we will reassess   Jacalyn Lefevre, MD [facility] [phone]

## 2022-12-24 LAB — LIPID PANEL
Cholesterol: 116 mg/dL (ref <200)
HDL: 55 mg/dL (ref >=40)
LDL Cholesterol (Calc): 46 mg/dL (calc)
Non-HDL Cholesterol (Calc): 61 mg/dL (calc) (ref <130)
Total CHOL/HDL Ratio: 2.1 (calc) (ref <5.0)
Triglycerides: 70 mg/dL (ref <150)

## 2022-12-24 LAB — CBC
HCT: 33.7 % — ABNORMAL LOW (ref 38.5–50.0)
MCH: 29.7 pg (ref 27.0–33.0)
MCHC: 32 g/dL (ref 32.0–36.0)
MCV: 92.6 fL (ref 80.0–100.0)
MPV: 10 fL (ref 7.5–12.5)
WBC: 4.8 10*3/uL (ref 3.8–10.8)

## 2023-01-18 ENCOUNTER — Ambulatory Visit: Payer: Medicare HMO | Admitting: Nurse Practitioner

## 2023-01-18 ENCOUNTER — Encounter: Payer: Self-pay | Admitting: Nurse Practitioner

## 2023-01-18 VITALS — BP 138/70 | HR 67 | Ht 69.0 in | Wt 158.0 lb

## 2023-01-18 DIAGNOSIS — K635 Polyp of colon: Secondary | ICD-10-CM

## 2023-01-18 DIAGNOSIS — Z86010 Personal history of colonic polyps: Secondary | ICD-10-CM

## 2023-01-18 DIAGNOSIS — I350 Nonrheumatic aortic (valve) stenosis: Secondary | ICD-10-CM

## 2023-01-18 NOTE — Progress Notes (Signed)
Assessment and Plan   Primary GI: David Amass, MD (hospital April 16109  Brief Narrative:  82 y.o.  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   -Hemoglobin stable at 10.8 following 5 units of blood during April admission  Cecal polyp ( 15 mm , sessile) intact.  Polyp found at at time of colonoscopy in April 2024.   Polyp was not removed due to the recent bleeding.  Patient is here to discuss colonoscopy with polyp removal.  -Will discuss with Dr. Tomasa Rand about whether he feels it is reasonable to proceed with colonoscopy with polypectomy.  Patient is at increased risk given comorbidities and this was explained to him and his Goddaughter today. Patient seems willing to proceed . Given aortic stenosis the procedure may need to be done at the hospital.   Severe aortic stenosis.  -Had Cardiology follow up on 5/8. Sounds like a TAVR is a consideration but no imminent.plans   History of Present Illness   Chief complaint: hospital follow up  [name] was seen by [place] in the hospital for 1024 for evaluation of GI bleed ing with severe anemia.  He has aortic stenosis.  A bleeding AVM was found in the colon and treated with APC.  He required 5 units of blood for hemoglobin of 5.6 down from the mid 11 range.   Patient is here with his God Daugher  [name], to discuss polyp resmval.  He hasn't had any further bleeding. Bowels moving okay.    -If polyp is tto be removed then he prefers early July or August   Previous GI Endoscopies / Labs / Imaging   Colonoscopy 11/12/22 - Preparation of the colon was fair due to recent bleeding. - Blood in the entire examined  colon. - Diverticulosis in the sigmoid colon, in the descending colon, in the transverse colon and in the ascending colon. - A single actively bleeding colonic angiodysplastic lesion. Treated with argon plasma coagulation (APC). - One 15 mm polyp in the cecum - not removed. - Internal hemorrhoids. - The examination was otherwise normal.     Latest Ref Rng & Units 11/18/2022    9:45 AM 11/16/2022   11:11 AM 11/10/2022    9:38 PM  Hepatic Function  Total Protein 6.1 - 8.1 g/dL 5.5  5.5  6.3   Albumin 3.5 - 5.0 g/dL  2.5  3.1   AST 10 - 35 U/L 24  42  28   ALT 9 - 46 U/L 21  29  26    Alk Phosphatase 38 - 126 U/L  60  46   Total Bilirubin 0.2 - 1.2 mg/dL 0.3  0.4  0.2        Latest Ref Rng & Units 12/23/2022   10:15 AM 12/03/2022    3:04 PM 11/18/2022    9:45 AM  CBC  WBC 3.8 - 10.8 Thousand/uL 4.8  4.0  7.2   Hemoglobin 13.2 - 17.1 g/dL 60.4  54.0  8.4   Hematocrit 38.5 - 50.0 % 33.7  32.8  26.1   Platelets 140 - 400 Thousand/uL 206  345  234     CHOCARDIOGRAM 11/12/2022: 1. Left ventricular ejection fraction, by estimation, is 60 to 65%. The left ventricle has normal function.  The left ventricle has no regional wall motion abnormalities. There is moderate asymmetric left ventricular hypertrophy of the infero-lateral  segment. Left ventricular diastolic parameters are consistent with Grade I diastolic dysfunction (impaired relaxation). Elevated left ventricular end-diastolic pressure.  2. Right ventricular systolic function is normal. The right ventricular size is normal. There is mildly elevated pulmonary artery systolic pressure. The estimated right ventricular systolic pressure is 36.2 mmHg.  3. The mitral valve is degenerative. Trivial mitral valve regurgitation. No evidence of mitral stenosis.  4. The aortic valve is calcified. Aortic valve regurgitation is mild. Severe aortic valve stenosis. Aortic valve area, by VTI measures 0.87 cm. Aortic valve mean gradient measures 51.0 mmHg. Aortic  valve Vmax measures 4.56 m/s.  5. 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 There is borderline dilatation of the aortic root, measuring 37 mm.  6. The inferior vena cava is normal in size with greater than 50% respiratory variability, suggesting right atrial pressure of 3 mmHg. Compared to prior studies from 2021 and 2022, mean gradient has increased from around 35 mmHg.    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)    Pure hypercholesterolemia    Unspecified vitamin D deficiency     Past Surgical History:  Procedure Laterality Date   CATARACT EXTRACTION     COLONOSCOPY WITH PROPOFOL N/A 11/12/2022   Procedure: COLONOSCOPY WITH PROPOFOL;  Surgeon: Benancio Deeds, MD;  Location: [facility] ENDOSCOPY;  Service: Gastroenterology;  Laterality: N/A;   EYE SURGERY     left eye,    cornea repair   HOT HEMOSTASIS N/A 11/12/2022   Procedure: HOT HEMOSTASIS (ARGON PLASMA COAGULATION/BICAP);  Surgeon: Benancio Deeds, MD;  Location: [facility] ENDOSCOPY;  Service: Gastroenterology;  Laterality: N/A;   PERIPHERAL VASCULAR CATHETERIZATION N/A 07/28/2016   Procedure: Lower Extremity Angiography;  Surgeon: Yates Decamp, MD;  Location: [facility] INVASIVE CV LAB;  Service: Cardiovascular;  Laterality: N/A;    Current Medications, Allergies, Family History and Social History were reviewed in [employer] record.     Current Outpatient Medications  Medication Sig Dispense Refill    amLODipine (NORVASC) 5 MG tablet TAKE 1 TABLET EVERY DAY (Patient taking differently: Take 5 mg by mouth daily.) 90 tablet 3   Cholecalciferol (VITAMIN D-3 PO) Take 1 tablet by mouth 2 (two) times daily.     clobetasol (TEMOVATE) 0.05 % GEL Apply 1 Application topically 2 (two) times daily. 30 g 1   dapagliflozin propanediol (FARXIGA) 10 MG TABS tablet Take 1 tablet (10 mg total) by mouth daily before breakfast. 90 tablet 1   ezetimibe (ZETIA) 10 MG tablet TAKE 1 TABLET EVERY DAY (Patient taking differently: Take 10 mg by mouth every evening.) 90 tablet 3   furosemide (LASIX) 20 MG tablet Take 20 mg by mouth 2 (two) times daily. Prescribed By Dr.Foster, Lawson Fiscal ([facility])     latanoprost (XALATAN) 0.005 % ophthalmic solution Place 1 drop into both eyes at bedtime.  losartan (COZAAR) 100 MG tablet TAKE 1 TABLET EVERY DAY (Patient taking differently: Take 100 mg by mouth every evening.) 90 tablet 3   Multiple Vitamins-Minerals (OCUVITE ADULT 50+) CAPS Take 1 capsule by mouth daily.     rosuvastatin (CRESTOR) 40 MG tablet TAKE 1 TABLET EVERY DAY (DISCONTINUE PRAVASTATIN) (Patient taking differently: Take 40 mg by mouth every evening.) 90 tablet 10   timolol (BETIMOL) 0.5 % ophthalmic solution Place 1 drop into both eyes every morning.      triamcinolone 0.1% oint-Cerave equivalent lotion 1:1 mixture Apply topically 2 (two) times daily. (Patient taking differently: Apply 1 application  topically daily.) 454 g 1   No current facility-administered medications for this visit.    Review of Systems: No chest pain. No shortness of breath. No urinary complaints.    Physical Exam  Wt Readings from Last 3 Encounters:  12/23/22 156 lb 6.4 oz (70.9 kg)  12/09/22 152 lb (68.9 kg)  12/03/22 152 lb 9.6 oz (69.2 kg)    BP 138/70   Pulse 67   Ht 5\' 9"  (1.753 m)   Wt 158 lb (71.7 kg)   BMI 23.33 kg/m  Constitutional:  Pleasant, generally well appearing male in no acute  distress. Psychiatric: Normal mood and affect. Behavior is normal. EENT: Pupils normal.  Conjunctivae are normal. No scleral icterus. Neck supple.  Cardiovascular: Normal rate, regular rhythm. Loud murmur Pulmonary/chest: Effort normal and breath sounds normal. No wheezing, rales or rhonchi. Abdominal: Soft, nondistended, nontender. Bowel sounds active throughout. There are no masses palpable. No hepatomegaly. Neurological: Alert and oriented to person place and time.  Willette Cluster, NP  01/18/2023, 8:25 AM

## 2023-01-18 NOTE — Patient Instructions (Signed)
_______________________________________________________  If your blood pressure at your visit was 140/90 or greater, please contact your primary care physician to follow up on this.  _______________________________________________________  If you are age 82 or older, your body mass index should be between 23-30. Your Body mass index is 23.33 kg/m. If this is out of the aforementioned range listed, please consider follow up with your Primary Care Provider.  If you are age 50 or younger, your body mass index should be between 19-25. Your Body mass index is 23.33 kg/m. If this is out of the aformentioned range listed, please consider follow up with your Primary Care Provider.   ________________________________________________________  The [facility] providers would like to encourage you to use [facility] to communicate with providers for non-urgent requests or questions.  Due to long hold times on the telephone, sending your provider a message by [facility] may be a faster and more efficient way to get a response.  Please allow 48 business hours for a response.  Please remember that this is for non-urgent requests.  _______________________________________________________   We will contact you after we consult with Dr. Tomasa Rand  Thank you for entrusting me with your care and choosing [facility].  Paula,NP

## 2023-01-19 NOTE — Progress Notes (Signed)
Agree with the assessment and plan as outlined by Willette Cluster, NP. I reviewed the patient's colonoscopy report by Dr. Adela Lank.  The polyp does not appear to be very high risk for malignant transformation.  It is unlikely that this polyp would progress to a malignancy in the patient's lifetime, but it is not a 0% chance.  I would offer the patient a colonoscopy in the hospital setting as long as cardiology feels it is safe to proceed with an elective sedated procedure (this is different than when the patient underwent an emergent colonoscopy for bleeding).     Sidra Oldfield E. Tomasa Rand, MD [facility]

## 2023-01-20 ENCOUNTER — Telehealth: Payer: Self-pay

## 2023-01-20 NOTE — Telephone Encounter (Signed)
-----   Message from Meredith Pel, NP sent at 01/19/2023 10:36 PM EDT ----- [name], sometime in next few days please contact patient and let him know I talked with Dr. Tomasa Rand about the polyp that is in his colon and whether it is reasonable to repeat a colonoscopy to remove it  I told the patient I would give him Dr Milas Hock thoughts and then he could decide.   Per Dr. Tomasa Rand:   The polyp does not appear to be very high risk for malignant transformation.  It is unlikely that this polyp would progress to a malignancy in the patient's lifetime, but it is not a 0% chance.  I would offer the patient a colonoscopy in the hospital setting as long as Cardiology feels it is safe to proceed with an ELECTIVE sedated procedure.    If decision is to proceed then please let me know and I will ask my CMA to ask for Cardiac clearance and get the procedure scheduled.   Thanks   .

## 2023-01-25 NOTE — Telephone Encounter (Signed)
Called the patient. No answer. Left a message to call me back. 

## 2023-01-25 NOTE — Telephone Encounter (Signed)
Patient is returning your call.  

## 2023-01-25 NOTE — Telephone Encounter (Signed)
Spoke with the patient on speaker phone with a male answering the phone. Patient is advised of the message from [facility] and Dr Tomasa Rand. Patient states "I don't want to" hve a colonoscopy. Questions invited. No questions at this time.

## 2023-01-27 ENCOUNTER — Ambulatory Visit: Payer: Medicare HMO | Admitting: Family Medicine

## 2023-02-05 DIAGNOSIS — R3912 Poor urinary stream: Secondary | ICD-10-CM | POA: Diagnosis not present

## 2023-02-05 DIAGNOSIS — R972 Elevated prostate specific antigen [PSA]: Secondary | ICD-10-CM | POA: Diagnosis not present

## 2023-02-05 DIAGNOSIS — N401 Enlarged prostate with lower urinary tract symptoms: Secondary | ICD-10-CM | POA: Diagnosis not present

## 2023-02-24 ENCOUNTER — Other Ambulatory Visit: Payer: Self-pay

## 2023-02-24 MED ORDER — EZETIMIBE 10 MG PO TABS: 10.0000 mg | ORAL_TABLET | Freq: Every day | ORAL | 3 refills | Status: DC

## 2023-03-08 ENCOUNTER — Other Ambulatory Visit: Payer: Self-pay | Admitting: *Deleted

## 2023-03-08 DIAGNOSIS — N1832 Chronic kidney disease, stage 3b: Secondary | ICD-10-CM

## 2023-03-08 MED ORDER — DAPAGLIFLOZIN PROPANEDIOL 10 MG PO TABS
10.0000 mg | ORAL_TABLET | Freq: Every day | ORAL | 1 refills | Status: DC
Start: 2023-03-08 — End: 2023-10-21

## 2023-03-08 NOTE — Telephone Encounter (Signed)
Patient called and stated that patient needs a refill on his Farxiga faxed to [facility] Fax: [phone].   Printed Rx and faxed as requested.  [facility] ID: [id number]

## 2023-03-12 ENCOUNTER — Encounter: Payer: Self-pay | Admitting: Family Medicine

## 2023-03-15 ENCOUNTER — Encounter: Payer: Self-pay | Admitting: Family Medicine

## 2023-03-16 ENCOUNTER — Encounter: Payer: Self-pay | Admitting: Family Medicine

## 2023-03-24 ENCOUNTER — Ambulatory Visit (INDEPENDENT_AMBULATORY_CARE_PROVIDER_SITE_OTHER): Payer: Medicare HMO | Admitting: Family Medicine

## 2023-03-24 ENCOUNTER — Encounter: Payer: Self-pay | Admitting: Family Medicine

## 2023-03-24 VITALS — BP 140/70 | HR 67 | Temp 97.1°F | Ht 69.0 in | Wt 159.9 lb

## 2023-03-24 DIAGNOSIS — D631 Anemia in chronic kidney disease: Secondary | ICD-10-CM | POA: Diagnosis not present

## 2023-03-24 DIAGNOSIS — D62 Acute posthemorrhagic anemia: Secondary | ICD-10-CM | POA: Diagnosis not present

## 2023-03-24 DIAGNOSIS — I350 Nonrheumatic aortic (valve) stenosis: Secondary | ICD-10-CM | POA: Diagnosis not present

## 2023-03-24 DIAGNOSIS — N184 Chronic kidney disease, stage 4 (severe): Secondary | ICD-10-CM

## 2023-03-24 DIAGNOSIS — E7800 Pure hypercholesterolemia, unspecified: Secondary | ICD-10-CM | POA: Diagnosis not present

## 2023-03-24 DIAGNOSIS — I10 Essential (primary) hypertension: Secondary | ICD-10-CM

## 2023-03-24 NOTE — Progress Notes (Signed)
Provider:  Jacalyn Lefevre, MD  Careteam: Patient Care Team: Frederica Kuster, MD as PCP - General (Family Medicine) Yates Decamp, MD as Consulting Physician (Cardiology) Ernesto Rutherford, MD as Consulting Physician (Ophthalmology) Pryor Ochoa, MD (Inactive) as Consulting Physician (Vascular Surgery) Micki Riley, MD as Consulting Physician (Neurology) Sherrie [name], MD as Consulting Physician (Ophthalmology)  PLACE OF SERVICE:  [facility]  Advanced Directive information Does Patient Have a Medical Advance Directive?: No, Would patient like information on creating a medical advance directive?: No - Patient declined  Allergies  Allergen Reactions   Penicillins Other (See Comments)    Unknown reaction    Chief Complaint  Patient presents with   Medication management of chronic issues     3 month follow up. Discuss the need for Shingrix, Covid , Flu vaccine and AWV. NCIR verified. Here with daughter, Elmarie Shiley      HPI: Patient is a 82 y.o. male here for medical management of chronic problems including hypertension atherosclerosis of aorta and hyperlipidemia.  He is accompanied by his daughter this morning.  There are no concerns or complaints.  He had lab work 3 months ago which we reviewed.  LDL is at goal at 46 there is some chronic kidney disease with creatinine 1.5 and GFR 44 There has been no change in cognitive abilities.  He continues to play chess against the computer for mental stimulation  Review of Systems:  Review of Systems  Constitutional: Negative.   HENT: Negative.    Genitourinary:  Positive for frequency.  Psychiatric/Behavioral: Negative.      Past Medical History:  Diagnosis Date   Anemia    Cataract    traumatic   Chronic diastolic heart failure (HCC)    Chronic kidney disease, stage III (moderate) (HCC)    Depressive disorder, not elsewhere classified    Elevated prostate specific antigen (PSA)    Hemiparesis of right dominant side as  late effect of cerebrovascular disease (HCC) 12/11/2018   Hyperlipidemia    Hypertension    Hypertensive kidney disease, benign    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)    Pure hypercholesterolemia    Unspecified vitamin D deficiency    Past Surgical History:  Procedure Laterality Date   CATARACT EXTRACTION     COLONOSCOPY WITH PROPOFOL N/A 11/12/2022   Procedure: COLONOSCOPY WITH PROPOFOL;  Surgeon: Benancio Deeds, MD;  Location: [facility] ENDOSCOPY;  Service: Gastroenterology;  Laterality: N/A;   EYE SURGERY     left eye,    cornea repair   HOT HEMOSTASIS N/A 11/12/2022   Procedure: HOT HEMOSTASIS (ARGON PLASMA COAGULATION/BICAP);  Surgeon: Benancio Deeds, MD;  Location: [facility] ENDOSCOPY;  Service: Gastroenterology;  Laterality: N/A;   PERIPHERAL VASCULAR CATHETERIZATION N/A 07/28/2016   Procedure: Lower Extremity Angiography;  Surgeon: Yates Decamp, MD;  Location: [facility] INVASIVE CV LAB;  Service: Cardiovascular;  Laterality: N/A;   Social History:   reports that he quit smoking about 52 years ago. His smoking use included cigarettes. He started smoking about 65 years ago. He has a 6.5 pack-year smoking history. He has never used smokeless tobacco. He reports that he does not drink alcohol and does not use drugs.  Family History  Problem Relation Age of Onset   Cancer Father     Medications: Patient's Medications  New Prescriptions   No medications on file  Previous Medications   AMLODIPINE (NORVASC) 5 MG TABLET    TAKE  1 TABLET EVERY DAY   CHOLECALCIFEROL (VITAMIN D-3 PO)    Take 1 tablet by mouth 2 (two) times daily.   CLOBETASOL (TEMOVATE) 0.05 % GEL    Apply 1 Application topically 2 (two) times daily.   DAPAGLIFLOZIN PROPANEDIOL (FARXIGA) 10 MG TABS TABLET    Take 1 tablet (10 mg total) by mouth daily before breakfast.   EZETIMIBE (ZETIA) 10 MG TABLET    Take 1 tablet (10 mg total) by mouth daily.   FUROSEMIDE (LASIX) 20  MG TABLET    Take 40 mg by mouth every Monday, Wednesday, and Friday. Prescribed By Dr.Foster, Lawson Fiscal [facility])   Takes two tablets  Mon, Wed, and Friday   LATANOPROST (XALATAN) 0.005 % OPHTHALMIC SOLUTION    Place 1 drop into both eyes at bedtime.    LOSARTAN (COZAAR) 100 MG TABLET    TAKE 1 TABLET EVERY DAY   MULTIPLE VITAMINS-MINERALS (OCUVITE ADULT 50+) CAPS    Take 1 capsule by mouth daily.   ROSUVASTATIN (CRESTOR) 40 MG TABLET    TAKE 1 TABLET EVERY DAY (DISCONTINUE PRAVASTATIN)   TIMOLOL (BETIMOL) 0.5 % OPHTHALMIC SOLUTION    Place 1 drop into both eyes every morning.   Modified Medications   No medications on file  Discontinued Medications   TRIAMCINOLONE 0.1% OINT-CERAVE EQUIVALENT LOTION 1:1 MIXTURE    Apply topically 2 (two) times daily.    Physical Exam:  Vitals:   03/24/23 0935  Pulse: 67  Temp: (!) 97.1 F (36.2 C)  TempSrc: Temporal  SpO2: 99%  Weight: 159 lb 14.4 oz (72.5 kg)  Height: 5\' 9"  (1.753 m)   Body mass index is 23.61 kg/m. Wt Readings from Last 3 Encounters:  03/24/23 159 lb 14.4 oz (72.5 kg)  01/18/23 158 lb (71.7 kg)  12/23/22 156 lb 6.4 oz (70.9 kg)    Physical Exam Vitals and nursing note reviewed.  Constitutional:      Appearance: Normal appearance.  Cardiovascular:     Rate and Rhythm: Normal rate and regular rhythm.  Pulmonary:     Effort: Pulmonary effort is normal.     Breath sounds: Normal breath sounds.  Musculoskeletal:        General: Normal range of motion.  Neurological:     General: No focal deficit present.     Mental Status: He is alert and oriented to person, place, and time.     Labs reviewed: Basic Metabolic Panel: Recent Labs    11/12/22 0113 11/13/22 0534 11/16/22 1111 11/18/22 0945 12/03/22 1504  NA 141   < > 136 139 139  K 4.7   < > 4.4 4.2 4.7  CL 115*   < > 111 110 106  CO2 18*   < > 17* 21 24  GLUCOSE 137*   < > 137* 148* 87  BUN 59*   < > 26* 25 38*  CREATININE 1.93*   < > 1.83*  1.61* 1.58*  CALCIUM 7.8*   < > 7.9* 8.3* 9.7  MG 2.1  --   --   --   --    < > = values in this interval not displayed.   Liver Function Tests: Recent Labs    07/14/22 0000 11/10/22 2138 11/16/22 1111 11/18/22 0945  AST  --  28 42* 24  ALT  --  26 29 21   ALKPHOS  --  46 60  --   BILITOT  --  0.2* 0.4 0.3  PROT  --  6.3*  5.5* 5.5*  ALBUMIN 4.3 3.1* 2.5*  --    No results for input(s): "LIPASE", "AMYLASE" in the last 8760 hours. No results for input(s): "AMMONIA" in the last 8760 hours. CBC: Recent Labs    11/16/22 1111 11/18/22 0945 12/03/22 1504 12/23/22 1015  WBC 7.6 7.2 4.0 4.8  NEUTROABS 5.5 4,630 2,100  --   HGB 8.0* 8.4* 10.2* 10.8*  HCT 24.4* 26.1* 32.8* 33.7*  MCV 92.1 91.9 94.8 92.6  PLT 173 234 345 206   Lipid Panel: Recent Labs    12/23/22 1015  CHOL 116  HDL 55  LDLCALC 46  TRIG 70  CHOLHDL 2.1   TSH: No results for input(s): "TSH" in the last 8760 hours. A1C: Lab Results  Component Value Date   HGBA1C 11.6 07/14/2022     Assessment/Plan  1. Acute blood loss anemia Continues to take iron every other day clinically, no evidence of anemia based on nailbeds and conjunctiva  2. Anemia in stage 4 chronic kidney disease (HCC) Last hemoglobin was 10.8.  Clinically nailbeds are and conjunctiva are normal in appearance  3. Nonrheumatic aortic valve stenosis Murmur unchanged; followed by cardiology  4. Essential hypertension Blood pressure good today at 140/70  5. Pure hypercholesterolemia On Zetia LDL is very good as noted above   Jacalyn Lefevre, MD [facility] [phone]

## 2023-04-12 ENCOUNTER — Ambulatory Visit: Payer: Medicare HMO

## 2023-04-12 DIAGNOSIS — I351 Nonrheumatic aortic (valve) insufficiency: Secondary | ICD-10-CM | POA: Diagnosis not present

## 2023-04-12 DIAGNOSIS — I350 Nonrheumatic aortic (valve) stenosis: Secondary | ICD-10-CM

## 2023-04-19 ENCOUNTER — Ambulatory Visit: Payer: Medicare HMO | Admitting: Dermatology

## 2023-04-20 DIAGNOSIS — H2511 Age-related nuclear cataract, right eye: Secondary | ICD-10-CM | POA: Diagnosis not present

## 2023-04-20 DIAGNOSIS — H401123 Primary open-angle glaucoma, left eye, severe stage: Secondary | ICD-10-CM | POA: Diagnosis not present

## 2023-04-20 DIAGNOSIS — H401112 Primary open-angle glaucoma, right eye, moderate stage: Secondary | ICD-10-CM | POA: Diagnosis not present

## 2023-04-20 DIAGNOSIS — Z961 Presence of intraocular lens: Secondary | ICD-10-CM | POA: Diagnosis not present

## 2023-04-20 DIAGNOSIS — Z9889 Other specified postprocedural states: Secondary | ICD-10-CM | POA: Diagnosis not present

## 2023-04-23 ENCOUNTER — Ambulatory Visit: Payer: Medicare HMO | Admitting: Cardiology

## 2023-04-26 ENCOUNTER — Ambulatory Visit: Payer: Medicare HMO | Admitting: Cardiology

## 2023-05-11 ENCOUNTER — Ambulatory Visit (INDEPENDENT_AMBULATORY_CARE_PROVIDER_SITE_OTHER): Payer: Medicare HMO | Admitting: Family

## 2023-05-11 ENCOUNTER — Encounter: Payer: Self-pay | Admitting: Family

## 2023-05-11 VITALS — BP 120/82 | HR 62 | Temp 97.4°F | Resp 16 | Ht 69.0 in | Wt 157.8 lb

## 2023-05-11 DIAGNOSIS — Z23 Encounter for immunization: Secondary | ICD-10-CM | POA: Diagnosis not present

## 2023-05-11 DIAGNOSIS — I10 Essential (primary) hypertension: Secondary | ICD-10-CM

## 2023-05-11 DIAGNOSIS — Z0000 Encounter for general adult medical examination without abnormal findings: Secondary | ICD-10-CM | POA: Diagnosis not present

## 2023-05-11 MED ORDER — CLONIDINE HCL 0.1 MG PO TABS
0.1000 mg | ORAL_TABLET | Freq: Every day | ORAL | Status: DC
Start: 2023-05-11 — End: 2023-06-22
  Administered 2023-05-11: 0.1 mg via ORAL

## 2023-05-11 NOTE — Patient Instructions (Signed)
[name] , Thank you for taking time to come for your Medicare Wellness Visit. I appreciate your ongoing commitment to your health goals. Please review the following plan we discussed and let me know if I can assist you in the future.   Screening recommendations/referrals: Colonoscopy : N/A  Recommended yearly ophthalmology/optometry visit for glaucoma screening and checkup Recommended yearly dental visit for hygiene and checkup  Vaccinations: Influenza vaccine due annually in September/October Pneumococcal vaccine : Up to date  Tdap vaccine: given this visit  Shingles vaccine: Please get vaccine at the pharmacy     Advanced directives: No   Conditions/risks identified: advanced age (>6men, >42 women);male gender;hypertension;smoking/ tobacco exposure  Next appointment: 1 year   Preventive Care 82 Years and Older, Male Preventive care refers to lifestyle choices and visits with your health care provider that can promote health and wellness. What does preventive care include? A yearly physical exam. This is also called an annual well check. Dental exams once or twice a year. Routine eye exams. Ask your health care provider how often you should have your eyes checked. Personal lifestyle choices, including: Daily care of your teeth and gums. Regular physical activity. Eating a healthy diet. Avoiding tobacco and drug use. Limiting alcohol use. Practicing safe sex. Taking low doses of aspirin every day. Taking vitamin and mineral supplements as recommended by your health care provider. What happens during an annual well check? The services and screenings done by your health care provider during your annual well check will depend on your age, overall health, lifestyle risk factors, and family history of disease. Counseling  Your health care provider may ask you questions about your: Alcohol use. Tobacco use. Drug use. Emotional well-being. Home and relationship  well-being. Sexual activity. Eating habits. History of falls. Memory and ability to understand (cognition). Work and work Astronomer. Screening  You may have the following tests or measurements: Height, weight, and BMI. Blood pressure. Lipid and cholesterol levels. These may be checked every 5 years, or more frequently if you are over 18 years old. Skin check. Lung cancer screening. You may have this screening every year starting at age 82 if you have a 30-pack-year history of smoking and currently smoke or have quit within the past 15 years. Fecal occult blood test (FOBT) of the stool. You may have this test every year starting at age 82. Flexible sigmoidoscopy or colonoscopy. You may have a sigmoidoscopy every 5 years or a colonoscopy every 10 years starting at age 82. Prostate cancer screening. Recommendations will vary depending on your family history and other risks. Hepatitis C blood test. Hepatitis B blood test. Sexually transmitted disease (STD) testing. Diabetes screening. This is done by checking your blood sugar (glucose) after you have not eaten for a while (fasting). You may have this done every 1-3 years. Abdominal aortic aneurysm (AAA) screening. You may need this if you are a current or former smoker. Osteoporosis. You may be screened starting at age 82 if you are at high risk. Talk with your health care provider about your test results, treatment options, and if necessary, the need for more tests. Vaccines  Your health care provider may recommend certain vaccines, such as: Influenza vaccine. This is recommended every year. Tetanus, diphtheria, and acellular pertussis (Tdap, Td) vaccine. You may need a Td booster every 10 years. Zoster vaccine. You may need this after age 82. Pneumococcal 13-valent conjugate (PCV13) vaccine. One dose is recommended after age 82. Pneumococcal polysaccharide (PPSV23) vaccine. One dose is  recommended after age 82. Talk to your health care  provider about which screenings and vaccines you need and how often you need them. This information is not intended to replace advice given to you by your health care provider. Make sure you discuss any questions you have with your health care provider. Document Released: 08/16/2015 Document Revised: 04/08/2016 Document Reviewed: 05/21/2015 Elsevier Interactive Patient Education  2017 [employer].  Fall Prevention in the Home Falls can cause injuries. They can happen to people of all ages. There are many things you can do to make your home safe and to help prevent falls. What can I do on the outside of my home? Regularly fix the edges of walkways and driveways and fix any cracks. Remove anything that might make you trip as you walk through a door, such as a raised step or threshold. Trim any bushes or trees on the path to your home. Use bright outdoor lighting. Clear any walking paths of anything that might make someone trip, such as rocks or tools. Regularly check to see if handrails are loose or broken. Make sure that both sides of any steps have handrails. Any raised decks and porches should have guardrails on the edges. Have any leaves, snow, or ice cleared regularly. Use sand or salt on walking paths during winter. Clean up any spills in your garage right away. This includes oil or grease spills. What can I do in the bathroom? Use night lights. Install grab bars by the toilet and in the tub and shower. Do not use towel bars as grab bars. Use non-skid mats or decals in the tub or shower. If you need to sit down in the shower, use a plastic, non-slip stool. Keep the floor dry. Clean up any water that spills on the floor as soon as it happens. Remove soap buildup in the tub or shower regularly. Attach bath mats securely with double-sided non-slip rug tape. Do not have throw rugs and other things on the floor that can make you trip. What can I do in the bedroom? Use night lights. Make  sure that you have a light by your bed that is easy to reach. Do not use any sheets or blankets that are too big for your bed. They should not hang down onto the floor. Have a firm chair that has side arms. You can use this for support while you get dressed. Do not have throw rugs and other things on the floor that can make you trip. What can I do in the kitchen? Clean up any spills right away. Avoid walking on wet floors. Keep items that you use a lot in easy-to-reach places. If you need to reach something above you, use a strong step stool that has a grab bar. Keep electrical cords out of the way. Do not use floor polish or wax that makes floors slippery. If you must use wax, use non-skid floor wax. Do not have throw rugs and other things on the floor that can make you trip. What can I do with my stairs? Do not leave any items on the stairs. Make sure that there are handrails on both sides of the stairs and use them. Fix handrails that are broken or loose. Make sure that handrails are as long as the stairways. Check any carpeting to make sure that it is firmly attached to the stairs. Fix any carpet that is loose or worn. Avoid having throw rugs at the top or bottom of the stairs. If  you do have throw rugs, attach them to the floor with carpet tape. Make sure that you have a light switch at the top of the stairs and the bottom of the stairs. If you do not have them, ask someone to add them for you. What else can I do to help prevent falls? Wear shoes that: Do not have high heels. Have rubber bottoms. Are comfortable and fit you well. Are closed at the toe. Do not wear sandals. If you use a stepladder: Make sure that it is fully opened. Do not climb a closed stepladder. Make sure that both sides of the stepladder are locked into place. Ask someone to hold it for you, if possible. Clearly mark and make sure that you can see: Any grab bars or handrails. First and last steps. Where the  edge of each step is. Use tools that help you move around (mobility aids) if they are needed. These include: Canes. Walkers. Scooters. Crutches. Turn on the lights when you go into a dark area. Replace any light bulbs as soon as they burn out. Set up your furniture so you have a clear path. Avoid moving your furniture around. If any of your floors are uneven, fix them. If there are any pets around you, be aware of where they are. Review your medicines with your doctor. Some medicines can make you feel dizzy. This can increase your chance of falling. Ask your doctor what other things that you can do to help prevent falls. This information is not intended to replace advice given to you by your health care provider. Make sure you discuss any questions you have with your health care provider. Document Released: 05/16/2009 Document Revised: 12/26/2015 Document Reviewed: 08/24/2014 Elsevier Interactive Patient Education  2017 [employer].

## 2023-05-11 NOTE — Progress Notes (Signed)
Subjective:   [name] is a 82 y.o. male who presents for Medicare Annual/Subsequent preventive examination.  Visit Complete: In person  Patient Medicare AWV questionnaire was completed by the patient on 05/11/2023; I have confirmed that all information answered by patient is correct and no changes since this date.  Cardiac Risk Factors include: advanced age (>63men, >42 women);male gender;hypertension;smoking/ tobacco exposure     Objective:    Today's Vitals   05/11/23 1307 05/11/23 1320 05/11/23 1348 05/11/23 1427  BP: (!) 150/110  (!) 150/110 120/82  Pulse:      Resp:      Temp:      SpO2:      Weight:      Height:      PainSc:  3      Body mass index is 23.3 kg/m.     05/11/2023    1:04 PM 03/24/2023    9:39 AM 11/12/2022   10:06 AM 11/11/2022    1:27 PM 05/05/2022    3:32 PM 05/02/2021    9:31 AM 07/22/2020   10:14 AM  Advanced Directives  Does Patient Have a Medical Advance Directive? No No No No No No No  Would patient like information on creating a medical advance directive?  No - Patient declined No - Patient declined No - Patient declined No - Patient declined No - Patient declined No - Patient declined    Current Medications (verified) Outpatient Encounter Medications as of 05/11/2023  Medication Sig   amLODipine (NORVASC) 5 MG tablet TAKE 1 TABLET EVERY DAY   Cholecalciferol (VITAMIN D-3 PO) Take 1 tablet by mouth 2 (two) times daily.   clobetasol (TEMOVATE) 0.05 % GEL Apply 1 Application topically 2 (two) times daily.   dapagliflozin propanediol (FARXIGA) 10 MG TABS tablet Take 1 tablet (10 mg total) by mouth daily before breakfast.   ezetimibe (ZETIA) 10 MG tablet Take 1 tablet (10 mg total) by mouth daily.   furosemide (LASIX) 20 MG tablet Take 40 mg by mouth every Monday, Wednesday, and Friday. Prescribed By Dr.Foster, Lawson [facility]cal [facility])   Takes two tablets  Mon, Wed, and Friday   latanoprost (XALATAN) 0.005 % ophthalmic solution  Place 1 drop into both eyes at bedtime.    losartan (COZAAR) 100 MG tablet TAKE 1 TABLET EVERY DAY   Multiple Vitamins-Minerals (OCUVITE ADULT 50+) CAPS Take 1 capsule by mouth daily.   rosuvastatin (CRESTOR) 40 MG tablet TAKE 1 TABLET EVERY DAY (DISCONTINUE PRAVASTATIN)   timolol (BETIMOL) 0.5 % ophthalmic solution Place 1 drop into both eyes every morning.    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)    Pure hypercholesterolemia    Unspecified vitamin D deficiency    Past Surgical History:  Procedure Laterality Date   CATARACT EXTRACTION     COLONOSCOPY WITH PROPOFOL  N/A 11/12/2022   Procedure: COLONOSCOPY WITH PROPOFOL;  Surgeon: Benancio Deeds, MD;  Location: [facility] ENDOSCOPY;  Service: Gastroenterology;  Laterality: N/A;   EYE SURGERY     left eye,    cornea repair   HOT HEMOSTASIS N/A 11/12/2022   Procedure: HOT HEMOSTASIS (ARGON PLASMA COAGULATION/BICAP);  Surgeon: Benancio Deeds, MD;  Location: [facility] ENDOSCOPY;  Service: Gastroenterology;  Laterality: N/A;   PERIPHERAL VASCULAR CATHETERIZATION N/A 07/28/2016   Procedure: Lower Extremity Angiography;  Surgeon: Yates Decamp, MD;  Location: [facility] INVASIVE CV LAB;  Service: Cardiovascular;  Laterality: N/A;   Family History  Problem Relation Age of Onset   Cancer Father    Social History    Socioeconomic History   Marital status: Divorced    Spouse name: Not on file   Number of children: 2   Years of education: Not on file   Highest education level: Not on file  Occupational History   Not on file  Tobacco Use   Smoking status: Former    Current packs/day: 0.00    Average packs/day: 0.5 packs/day for 13.0 years (6.5 ttl pk-yrs)    Types: Cigarettes    Start date: 03/08/1958    Quit date: 03/09/1971    Years since quitting: 52.2   Smokeless tobacco: Never  Vaping Use   Vaping status: Never Used  Substance and Sexual Activity   Alcohol use: No    Alcohol/week: 0.0 standard drinks of alcohol   Drug use: No   Sexual activity: Not on file  Other Topics Concern   Not on file  Social History Narrative   Not on file   Social Determinants of Health   Financial Resource Strain: Low Risk  (04/18/2018)   Overall Financial Resource Strain (CARDIA)    Difficulty of Paying Living Expenses: Not hard at all  Food Insecurity: No Food Insecurity (11/17/2022)   Hunger Vital Sign    Worried About Running Out of Food in the Last Year: Never true    Ran Out of Food in the Last Year: Never true  Transportation Needs: No Transportation Needs (11/17/2022)   PRAPARE - Administrator, Civil Service (Medical): No    Lack of Transportation (Non-Medical): No  Physical Activity: Insufficiently Active (04/18/2018)   Exercise Vital Sign    Days of Exercise per Week: 4 days    Minutes of Exercise per Session: 10 min  Stress: No Stress Concern Present (04/18/2018)   [employer] of Occupational Health - Occupational Stress Questionnaire    Feeling of Stress : Not at all  Social Connections: Moderately Isolated (04/18/2018)   Social Connection and Isolation Panel [NHANES]    Frequency of Communication with Friends and Family: More than three times a week    Frequency of Social Gatherings with Friends and Family: More than three times a week    Attends Religious Services: Never     Database administrator or Organizations: No    Attends Engineer, structural: Never    Marital Status: Divorced    Tobacco Counseling Counseling given: Not Answered   Clinical Intake:  Pre-visit preparation completed: No  Pain : 0-10 Pain Score: 3  Pain Type: Chronic pain Pain Location: Leg Pain Orientation: Left, Right Pain Radiating Towards: NO Pain Descriptors / Indicators: Aching Pain Onset: More than a month ago Pain Frequency: Intermittent Pain Relieving Factors: Rest Effect of Pain on Daily Activities: walking  Pain Relieving Factors: Rest  BMI - recorded: 23.3 Nutritional Status:  BMI of 19-24  Normal Nutritional Risks: None Diabetes: No  How often do you need to have someone help you when you read instructions, pamphlets, or other written materials from your doctor or pharmacy?: 1 - Never What is the last grade level you completed in school?: college one year  Interpreter Needed?: No  Activities of Daily Living    05/11/2023    1:29 PM 11/11/2022    1:30 PM  In your present state of health, do you have any difficulty performing the following activities:  Hearing? 0   Vision? 0   Walking or climbing stairs? 0   Dressing or bathing? 0   Doing errands, shopping? 1 0  Comment God daughter drive for him.   Preparing Food and eating ? N   Using the Toilet? N   In the past six months, have you accidently leaked urine? N   Do you have problems with loss of bowel control? N   Managing your Medications? N   Managing your Finances? N   Housekeeping or managing your Housekeeping? Y   Comment God daughter asist     Patient Care Team: Venita Sheffield, MD as PCP - General (Internal Medicine) Yates Decamp, MD as Consulting Physician (Cardiology) Ernesto Rutherford, MD as Consulting Physician (Ophthalmology) Pryor Ochoa, MD (Inactive) as Consulting Physician (Vascular Surgery) Micki Riley, MD as Consulting Physician (Neurology) Sherrie [name], MD as Consulting Physician (Ophthalmology)  Indicate any recent Medical Services you may have received from other than [facility] providers in the past year (date may be approximate).     Assessment:   This is a routine wellness examination for [name].  Hearing/Vision screen No results found.   Goals Addressed             This Visit's Progress    Patient Stated       Stay health        Depression Screen    05/11/2023    1:04 PM 03/24/2023    9:37 AM 05/05/2022    3:46 PM 05/05/2022    3:30 PM 05/02/2021    9:28 AM 07/22/2020   10:13 AM 04/30/2020    9:20 AM  PHQ 2/9 Scores  PHQ - 2 Score 0 0 0 0 0 0 0  PHQ- 9 Score   0 0       Fall Risk    05/11/2023    1:04 PM 03/24/2023    9:37 AM 11/18/2022    9:14 AM 05/05/2022    3:46 PM 05/05/2022    3:29 PM  Fall Risk   Falls in the past year? 0 0 0 0 0  Number falls in past yr:  0 0 0 0  Injury with Fall?  0 0 0 0  Risk for fall due to :  No Fall Risks History of fall(s) No Fall Risks No Fall Risks  Follow up  Falls evaluation completed Falls evaluation completed Falls evaluation completed Falls evaluation completed    MEDICARE RISK AT HOME: Medicare Risk at Home Any stairs in or around the home?: Yes If so, are there any without handrails?: Yes Home free of loose throw rugs in walkways, pet beds, electrical cords, etc?: No Adequate lighting in your home to reduce risk of falls?: Yes Life alert?: No Use of a cane, walker or w/c?: No Grab bars in the bathroom?: No Shower chair or bench in shower?: No Elevated toilet seat or a handicapped toilet?: No  TIMED UP AND  GO:  Was the test performed?  Yes  Length of time to ambulate 10 feet: 25 sec Gait unsteady without use of assistive device, provider informed and interventions were implemented    Cognitive Function:    05/11/2023    1:07 PM 04/18/2018    9:58 AM 03/10/2017    9:16 AM 03/26/2016    9:21 AM  MMSE - Mini Mental State Exam  Orientation to time 4 5 5 5    Orientation to Place 5 4 5 5   Registration 3 3 3 3   Attention/ Calculation 5 5 5 5   Recall 2 1 2 2   Language- name 2 objects 2 2 2 2   Language- repeat 1 1 1 1   Language- follow 3 step command 3 3 3 2   Language- read & follow direction 1 1 1 1   Write a sentence 1 1 1 1   Copy design 1 1 1 1   Total score 28 27 29 28         05/05/2022    3:33 PM 05/02/2021    9:29 AM 04/30/2020    9:21 AM 04/25/2019    9:15 AM  6CIT Screen  What Year? 0 points 0 points 0 points 0 points  What month? 0 points 0 points 0 points 0 points  What time? 0 points 0 points 0 points 0 points  Count back from 20 0 points 0 points 0 points 0 points  Months in reverse  0 points 0 points 0 points  Repeat phrase 0 points 4 points 6 points   Total Score  4 points 6 points     Immunizations Immunization History  Administered Date(s) Administered   Fluad Quad(high Dose 65+) 04/03/2019, 07/22/2020, 05/06/2021, 05/12/2022   Fluad Trivalent(High Dose 65+) 05/11/2023   Influenza Split 04/29/2010   Influenza, High Dose Seasonal PF 07/22/2017, 04/18/2018   Influenza,inj,Quad PF,6+ Mos 06/05/2013, 07/09/2014, 06/14/2015   PFIZER Comirnaty(Gray Top)Covid-19 Tri-Sucrose Vaccine 10/04/2020   PFIZER(Purple Top)SARS-COV-2 Vaccination 03/02/2020, 04/02/2020   Pfizer(Comirnaty)Fall Seasonal Vaccine 12 years and older 07/24/2022   Pneumococcal Conjugate-13 10/02/2013   Pneumococcal Polysaccharide-23 11/05/2011   Tdap 04/25/2019    TDAP status: Up to date  Flu Vaccine status: Due, Education has been provided regarding the importance of this vaccine. Advised may receive this vaccine at local pharmacy or Health Dept. Aware to provide a copy of the vaccination record if obtained from local pharmacy or Health Dept. Verbalized acceptance and understanding.  Pneumococcal vaccine status: Up to date  Covid-19 vaccine status: Information provided on how to obtain vaccines.   Qualifies for Shingles Vaccine? Yes   Zostavax  completed No   Shingrix Completed?: No.    Education has been provided regarding the importance of this vaccine. Patient has been advised to call insurance company to determine out of pocket expense if they have not yet received this vaccine. Advised may also receive vaccine at local pharmacy or Health Dept. Verbalized acceptance and understanding.  Screening Tests Health Maintenance  Topic Date Due   Zoster Vaccines- Shingrix (1 of 2) Never done   COVID-19 Vaccine (5 - 2023-24 season) 04/04/2023   Medicare Annual Wellness (AWV)  05/10/2024   DTaP/Tdap/Td (2 - Td or Tdap) 04/24/2029   Pneumonia Vaccine 69+ Years old  Completed   INFLUENZA VACCINE  Completed   HPV VACCINES  Aged Out   Hepatitis C Screening  Discontinued    Health Maintenance  Health Maintenance Due  Topic Date Due   Zoster Vaccines- Shingrix (1 of 2) Never done  COVID-19 Vaccine (5 - 2023-24 season) 04/04/2023    Colorectal cancer screening: No longer required.   Lung Cancer Screening: (Low Dose CT Chest recommended if Age 27-80 years, 20 pack-year currently smoking OR have quit w/in 15years.) does not qualify.   Lung Cancer Screening Referral: No   Additional Screening:  Hepatitis C Screening: does not qualify; Completed No   Vision Screening: Recommended annual ophthalmology exams for early detection of glaucoma and other disorders of the eye. Is the patient up to date with their annual eye exam?  Yes  Who is the provider or what is the name of the office in which the patient attends annual eye exams? Dr.Groat  If pt is not established with a provider, would they like to be referred to a provider to establish care? No .   Dental Screening: Recommended annual dental exams for proper oral hygiene  Diabetic Foot Exam: Diabetic Foot Exam: Completed N/A   Community Resource Referral / Chronic Care Management: CRR required this visit?  No   CCM required this visit?  No     Plan:     I have personally  reviewed and noted the following in the patient's chart:   Medical and social history Use of alcohol, tobacco or illicit drugs  Current medications and supplements including opioid prescriptions. Patient is not currently taking opioid prescriptions. Functional ability and status Nutritional status Physical activity Advanced directives List of other physicians Hospitalizations, surgeries, and ER visits in previous 12 months Vitals Screenings to include cognitive, depression, and falls Referrals and appointments  In addition, I have reviewed and discussed with patient certain preventive protocols, quality metrics, and best practice recommendations. A written personalized care plan for preventive services as well as general preventive health recommendations were provided to patient.     Caesar Bookman, NP   05/11/2023   After Visit Summary: (In Person-Printed) AVS printed and given to the patient  Nurse Notes: Advised to get Shingles and COVID-19 vaccine at the pharmacy

## 2023-05-22 ENCOUNTER — Other Ambulatory Visit: Payer: Self-pay | Admitting: Cardiology

## 2023-05-22 DIAGNOSIS — E7800 Pure hypercholesterolemia, unspecified: Secondary | ICD-10-CM

## 2023-06-11 ENCOUNTER — Other Ambulatory Visit: Payer: Self-pay | Admitting: Family Medicine

## 2023-06-18 DIAGNOSIS — N1832 Chronic kidney disease, stage 3b: Secondary | ICD-10-CM | POA: Diagnosis not present

## 2023-06-21 ENCOUNTER — Encounter: Payer: Self-pay | Admitting: Cardiology

## 2023-06-21 ENCOUNTER — Ambulatory Visit: Payer: Medicare HMO | Attending: Cardiology | Admitting: Cardiology

## 2023-06-21 VITALS — BP 134/52 | HR 65 | Resp 16 | Ht 69.0 in | Wt 160.0 lb

## 2023-06-21 DIAGNOSIS — I10 Essential (primary) hypertension: Secondary | ICD-10-CM | POA: Diagnosis not present

## 2023-06-21 DIAGNOSIS — I779 Disorder of arteries and arterioles, unspecified: Secondary | ICD-10-CM | POA: Diagnosis not present

## 2023-06-21 DIAGNOSIS — N1832 Chronic kidney disease, stage 3b: Secondary | ICD-10-CM | POA: Diagnosis not present

## 2023-06-21 DIAGNOSIS — I359 Nonrheumatic aortic valve disorder, unspecified: Secondary | ICD-10-CM

## 2023-06-21 DIAGNOSIS — E7800 Pure hypercholesterolemia, unspecified: Secondary | ICD-10-CM | POA: Diagnosis not present

## 2023-06-21 NOTE — Patient Instructions (Signed)
Medication Instructions:  Your physician recommends that you continue on your current medications as directed. Please refer to the Current Medication list given to you today.  *If you need a refill on your cardiac medications before your next appointment, please call your pharmacy*   Lab Work: none If you have labs (blood work) drawn today and your tests are completely normal, you will receive your results only by: MyChart Message (if you have MyChart) OR A paper copy in the mail If you have any lab test that is abnormal or we need to change your treatment, we will call you to review the results.   Testing/Procedures: Your physician has requested that you have an echocardiogram. Echocardiography is a painless test that uses sound waves to create images of your heart. It provides your doctor with information about the size and shape of your heart and how well your heart's chambers and valves are working. This procedure takes approximately one hour. There are no restrictions for this procedure. Please do NOT wear cologne, perfume, aftershave, or lotions (deodorant is allowed). Please arrive 15 minutes prior to your appointment time.  Please note: We ask at that you not bring children with you during ultrasound (echo/ vascular) testing. Due to room size and safety concerns, children are not allowed in the ultrasound rooms during exams. Our front office staff cannot provide observation of children in our lobby area while testing is being conducted. An adult accompanying a patient to their appointment will only be allowed in the ultrasound room at the discretion of the ultrasound technician under special circumstances. We apologize for any inconvenience.    Follow-Up: At [facility], you and your health needs are our priority.  As part of our continuing mission to provide you with exceptional heart care, we have created designated Provider Care Teams.  These Care Teams include your  primary Cardiologist (physician) and Advanced Practice Providers (APPs -  Physician Assistants and Nurse Practitioners) who all work together to provide you with the care you need, when you need it.  We recommend signing up for the patient portal called "MyChart".  Sign up information is provided on this After Visit Summary.  MyChart is used to connect with patients for Virtual Visits (Telemedicine).  Patients are able to view lab/test results, encounter notes, upcoming appointments, etc.  Non-urgent messages can be sent to your provider as well.   To learn more about what you can do with MyChart, go to [email or web address].    Your next appointment:   12 month(s)  Provider:   Yates Decamp, MD     Other Instructions

## 2023-06-21 NOTE — Progress Notes (Signed)
Cardiology Office Note:  .   Date:  06/21/2023  ID:  [name], DOB [birth year], MRN [id number] PCP: Venita Sheffield, MD  [facility] Providers Cardiologist:  Yates Decamp, MD    History of Present Illness: .   [name] is a 82 y.o. 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   Patient had hospitalization from 4/9 through 11/14/2022 with acute GI bleed from bleeding colonic angiodysplasia, he also had colonic polyps and external hemorrhoids name blood transfusion.  Echocardiogram performed 11/09/2022 elevated severe aortic stenosis.  He is accompanied by his God daughter Elmarie Shiley.  Discussed the use of AI scribe software for clinical note transcription with the patient, who gave verbal consent to proceed.  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 The patient remains asymptomatic with no complaints of chest tightness, shortness of breath, or fainting spells, which could indicate worsening of the aortic stenosis. The patient's mobility is limited due to the stroke, but he remains active at home with the help of a stationary bike. The caregiver reports that the patient struggles with long distances and often uses a scooter for grocery shopping. The patient's blood pressure is well controlled on amlodipine and losartan, along with timolol eye drops.      Review of Systems  Cardiovascular:  Positive for claudication. Negative for chest pain, dyspnea on exertion and leg swelling.  Neurological:  Positive for focal weakness (right hemiparesis).     Labs    Lab Results  Component Value Date   CHOL 116 12/23/2022   HDL 55 12/23/2022   LDLCALC 46 12/23/2022   TRIG 70 12/23/2022   CHOLHDL 2.1 12/23/2022   Lab Results  Component Value Date   NA 139 12/03/2022   K 4.7 12/03/2022   CO2 24 12/03/2022   GLUCOSE 87 12/03/2022   BUN 38 (H) 12/03/2022   CREATININE 1.58 (H) 12/03/2022   CALCIUM 9.7 12/03/2022   EGFR 44 (L) 12/03/2022   GFRNONAA 37 (L) 11/16/2022      Latest Ref Rng & Units 12/03/2022    3:04 PM 11/18/2022    9:45 AM 11/16/2022   11:11 AM  BMP  Glucose 65 - 99 mg/dL 87  725  366   BUN 7 - 25 mg/dL 38  25  26   Creatinine 0.70 - 1.22 mg/dL 4.40  3.47  4.25   BUN/Creat Ratio 6 - 22 (calc) 24  16    Sodium 135 - 146 mmol/L 139  139  136   Potassium 3.5 - 5.3 mmol/L 4.7  4.2  4.4   Chloride 98 - 110 mmol/L 106  110  111   CO2 20 - 32 mmol/L 24  21  17    Calcium 8.6 - 10.3 mg/dL 9.7  8.3  7.9        Latest Ref Rng & Units 12/23/2022   10:15 AM 12/03/2022    3:04 PM 11/18/2022    9:45 AM  CBC  WBC 3.8 - 10.8 Thousand/uL 4.8  4.0  7.2   Hemoglobin 13.2 - 17.1 g/dL 95.6  38.7  8.4   Hematocrit 38.5 - 50.0 %  33.7  32.8  26.1   Platelets 140 - 400 Thousand/uL 206  345  234    Physical Exam:   VS:  BP (!) 134/52 (BP Location: Left Arm, Patient Position: Sitting, Cuff Size: Normal)   Pulse 65   Resp 16   Ht 5\' 9"  (1.753 m)   Wt 160 lb (72.6 kg)   SpO2 95%   BMI 23.63 kg/m    Wt Readings from Last 3 Encounters:  06/21/23 160 lb (72.6 kg)  05/11/23 157 lb 12.8 oz (71.6 kg)  03/24/23 159 lb 14.4 oz (72.5 kg)     Physical Exam Constitutional:      Appearance: He is well-developed.  Neck:     Vascular: Carotid bruit (bilateral) present. No JVD.  Cardiovascular:     Rate and Rhythm: Normal rate and regular rhythm.     Pulses:          Femoral pulses are 2+ on the right side and 2+ on the left side.      Dorsalis pedis pulses are 0 on the right side and 0 on the left side.       Posterior tibial pulses  are 0 on the right side and 0 on the left side.     Heart sounds: S1 normal and S2 normal. Murmur heard.     Early systolic murmur is present with a grade of 3/6 at the upper right sternal border radiating to the neck.     High-pitched blowing decrescendo early diastolic murmur is present with a grade of 2/4 at the upper right sternal border radiating to the apex.     No gallop.  Pulmonary:     Effort: Pulmonary effort is normal.     Breath sounds: Normal breath sounds.  Abdominal:     General: Bowel sounds are normal.     Palpations: Abdomen is soft.  Musculoskeletal:     Right lower leg: No edema.     Left lower leg: No edema.  Skin:    Capillary Refill: Capillary refill takes less than 2 seconds.     Studies Reviewed: Marland Kitchen    Lexiscan Tetrofosmin stress test 12/20/2019: No previous exam available for comparison. Lexiscan nuclear stress test performed using 1-day protocol. Stress EKG is non-diagnostic, as this is pharmacological stress test. Rest and stress EKG shows sinus rhythm, RBBB, T wave inversion inferolateral leads, occasional PAC. SPECT images show small sized, mild intensity, mildly reversible perfusion defect in mid to basal inferior myocardium. Stress LVEF calculated 44%, although visually appears normal. No regional abnormalities of myocardial thickening noted. Low risk study.  Echocardiogram 04/12/2023: Left ventricle cavity is normal in size. Moderate concentric hypertrophy of the left ventricle. Mild inferior hypokinesis. Normal LV systolic function with EF 54%. Doppler evidence of grade I (impaired) diastolic dysfunction, normal LAP.  The aortic root is normal. Mildly dilated ascending aorta at 4.3 cm. Left atrial cavity is severely dilated. Moderate aortic valve leaflet calcification. Moderate to severe aortic stenosis. Vmax 3.9 m/sec, mean PG 28 mmHg, AVA 1 cm by continuity equation. Dimensionless index 0.25. Mild (Grade I) aortic regurgitation.  Mild (Grade I) mitral  regurgitation. Mild pulmonic regurgitation. Compared to previous studies in 2021, 2022, no significant change in aortic stenosis. Ascending aorta dilation was previous not reported.  EKG:         EKG 12/09/2022: Ectopic atrial rhythm at the rate of 60 bpm, right axis deviation, right bundle branch block.  LVH by voltage.  T wave abnormality, consider  inferior ischemia.  Compared to 04/20/2022, sinus rhythm has been replaced by ectopic atrial rhythm, left axis deviation is now right axis.   Current Meds  Medication Sig   amLODipine (NORVASC) 5 MG tablet TAKE 1 TABLET EVERY DAY   Cholecalciferol (VITAMIN D-3 PO) Take 1 tablet by mouth 2 (two) times daily.   clobetasol (TEMOVATE) 0.05 % GEL Apply 1 Application topically 2 (two) times daily.   dapagliflozin propanediol (FARXIGA) 10 MG TABS tablet Take 1 tablet (10 mg total) by mouth daily before breakfast.   ezetimibe (ZETIA) 10 MG tablet Take 1 tablet (10 mg total) by mouth daily.   furosemide (LASIX) 20 MG tablet Take 40 mg by mouth every Monday, Wednesday, and Friday. Prescribed By Dr.Foster, Lawson Fiscal [facility])   Takes two tablets  Mon, Wed, and Friday   latanoprost (XALATAN) 0.005 % ophthalmic solution Place 1 drop into both eyes at bedtime.    losartan (COZAAR) 100 MG tablet TAKE 1 TABLET EVERY DAY   Multiple Vitamins-Minerals (OCUVITE ADULT 50+) CAPS Take 1 capsule by mouth daily.   rosuvastatin (CRESTOR) 40 MG tablet TAKE 1 TABLET EVERY DAY (DISCONTINUE PRAVASTATIN)   timolol (BETIMOL) 0.5 % ophthalmic solution Place 1 drop into both eyes every morning.    Current Facility-Administered Medications for the 06/21/23 encounter (Office Visit) with Yates Decamp, MD  Medication   cloNIDine (CATAPRES) tablet 0.1 mg     ASSESSMENT AND PLAN: .      ICD-10-CM   1. Aortic valve disorder  I35.9 ECHOCARDIOGRAM COMPLETE    2. PAOD (peripheral arterial occlusive disease) (HCC)  I77.9     3. Primary hypertension  I10     4. Stage  3b chronic kidney disease (HCC)  N18.32     5. Hypercholesteremia  E78.00       Assessment and Plan  1. Aortic valve disorder Patient with severe aortic stenosis, he is essentially asymptomatic however no change in stenosis severity compared to prior echocardiogram a year ago.  I will repeat echocardiogram again in 1 year prior to his next office visit with me.  2. PAOD (peripheral arterial occlusive disease) (HCC) PAD has remained stable, he has absent pedal pulses however capillary refill is <2 seconds.  He remains asymptomatic.  His limitation is mostly due to right hemiparesis from prior stroke but still tends to remain active.  Very minimal symptoms of claudication.  3. Primary hypertension Blood pressure is very well-controlled on amlodipine and also losartan along with timolol eyedrops, continue the same.  4. Stage 3b chronic kidney disease (HCC) I reviewed his external labs, stage IIIb chronic kidney disease has remained stable.  5. Hypercholesteremia Lipids under excellent control, presently tolerating Crestor and also Zetia.  No change in the medications today.  I will see him back in a year or sooner if problems.   Signed,  Yates Decamp, MD, [facility] 06/21/2023, 5:46 PM [facility] [address] [place], [place] [address] Phone: [phone]. Fax:  [phone]

## 2023-06-22 ENCOUNTER — Encounter: Payer: Self-pay | Admitting: Sports Medicine

## 2023-06-22 ENCOUNTER — Ambulatory Visit (INDEPENDENT_AMBULATORY_CARE_PROVIDER_SITE_OTHER): Payer: Medicare HMO | Admitting: Sports Medicine

## 2023-06-22 VITALS — BP 140/80 | HR 65 | Temp 96.3°F | Resp 16 | Ht 69.0 in | Wt 161.0 lb

## 2023-06-22 DIAGNOSIS — I69351 Hemiplegia and hemiparesis following cerebral infarction affecting right dominant side: Secondary | ICD-10-CM

## 2023-06-22 DIAGNOSIS — I10 Essential (primary) hypertension: Secondary | ICD-10-CM

## 2023-06-22 DIAGNOSIS — I350 Nonrheumatic aortic (valve) stenosis: Secondary | ICD-10-CM

## 2023-06-22 DIAGNOSIS — K922 Gastrointestinal hemorrhage, unspecified: Secondary | ICD-10-CM | POA: Diagnosis not present

## 2023-06-22 DIAGNOSIS — I5032 Chronic diastolic (congestive) heart failure: Secondary | ICD-10-CM | POA: Diagnosis not present

## 2023-06-22 DIAGNOSIS — N1832 Chronic kidney disease, stage 3b: Secondary | ICD-10-CM | POA: Diagnosis not present

## 2023-06-22 NOTE — Progress Notes (Addendum)
Careteam: Patient Care Team: David Sheffield, MD as PCP - General (Internal Medicine) David Decamp, MD as PCP - Cardiology (Cardiology) David Decamp, MD as Consulting Physician (Cardiology) David Rutherford, MD as Consulting Physician (Ophthalmology) David Ochoa, MD (Inactive) as Consulting Physician (Vascular Surgery) David Riley, MD as Consulting Physician (Neurology) [name], MD as Consulting Physician (Ophthalmology)  PLACE OF SERVICE:  [facility]  Advanced Directive information    Allergies  Allergen Reactions   Penicillins Other (See Comments)    Unknown reaction    Chief Complaint  Patient presents with   Medical Management of Chronic Issues    3 month follow up.    Immunizations    Discuss the need for Shingrix vaccine, and Covid Booster.      HPI: Patient is a 82 y.o. male is here for follow up  Accompanied by her daughter  Has no concerns today   Aortic stenosis  Denies chest pain, palpitations, SOB, dizzy or lightheaded Echo 11/09/2022 -severe aortic stenosis.  Followed with cardiology with plan to repeat echo in 1 yr  H/O CVA Has residual weakness from stroke Denies coughing or chocking while eating On crestor   PVD C/o pain in his legs when he walks for too long  On crestor   CKD     Latest Ref Rng & Units 12/03/2022    3:04 PM 11/18/2022    9:45 AM 11/16/2022   11:11 AM  BMP  Glucose 65 - 99 mg/dL 87  474  259   BUN 7 - 25 mg/dL 38  25  26   Creatinine 0.70 - 1.22 mg/dL 5.63  8.75  6.43   BUN/Creat Ratio 6 - 22 (calc) 24  16    Sodium 135 - 146 mmol/L 139  139  136   Potassium 3.5 - 5.3 mmol/L 4.7  4.2  4.4   Chloride 98 - 110 mmol/L 106  110  111   CO2 20 - 32 mmol/L 24  21  17    Calcium 8.6 - 10.3 mg/dL 9.7  8.3  7.9       Chronic low back pain Intermittent pain  No problems with his balance Takes tylenol  prn for pain   H/o GI Bleeding Denies dark or bloody stools Followed with GI   S/p colonoscopy 2024  colonic polyps and external hemorrhoids name blood transfusion.    HTN  Denies feeling dizzy  On amlodipine, losartanlasix M, W, F   HLD On crestor , zetia       Review of Systems:  Review of Systems  Constitutional:  Negative for chills and fever.  HENT:  Negative for congestion and sore throat.   Eyes:  Negative for double vision.  Respiratory:  Negative for cough, sputum production and shortness of breath.   Cardiovascular:  Negative for chest pain, palpitations and leg swelling.  Gastrointestinal:  Negative for abdominal pain, heartburn and nausea.  Genitourinary:  Negative for dysuria, frequency and hematuria.  Musculoskeletal:  Positive for back pain. Negative for falls and myalgias.  Neurological:  Negative for dizziness, sensory change and focal weakness.    Past Medical History:  Diagnosis Date   Anemia    Cataract    traumatic   Chronic diastolic heart failure (HCC)    Chronic kidney disease, stage III (moderate) (HCC)    Depressive disorder, not elsewhere classified    Elevated prostate specific antigen (PSA)    Hemiparesis of right dominant side as late effect  of cerebrovascular disease (HCC) 12/11/2018   Hyperlipidemia    Hypertension    Hypertensive kidney disease, benign    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)    Pure hypercholesterolemia    Unspecified vitamin D deficiency    Past Surgical History:  Procedure Laterality Date   CATARACT EXTRACTION     COLONOSCOPY WITH PROPOFOL N/A 11/12/2022   Procedure: COLONOSCOPY WITH PROPOFOL;  Surgeon: Benancio Deeds, MD;  Location: [facility] ENDOSCOPY;  Service: Gastroenterology;  Laterality: N/A;   EYE SURGERY     left eye,    cornea repair   HOT HEMOSTASIS N/A 11/12/2022   Procedure: HOT HEMOSTASIS (ARGON PLASMA COAGULATION/BICAP);  Surgeon: Benancio Deeds, MD;  Location: [facility] ENDOSCOPY;  Service: Gastroenterology;  Laterality: N/A;   PERIPHERAL  VASCULAR CATHETERIZATION N/A 07/28/2016   Procedure: Lower Extremity Angiography;  Surgeon: David Decamp, MD;  Location: [facility] INVASIVE CV LAB;  Service: Cardiovascular;  Laterality: N/A;   Social History:   reports that he quit smoking about 52 years ago. His smoking use included cigarettes. He started smoking about 65 years ago. He has a 6.5 pack-year smoking history. He has never used smokeless tobacco. He reports that he does not drink alcohol and does not use drugs.  Family History  Problem Relation Age of Onset   Cancer Father     Medications: Patient's Medications  New Prescriptions   No medications on file  Previous Medications   AMLODIPINE (NORVASC) 5 MG TABLET    TAKE 1 TABLET EVERY DAY   CHOLECALCIFEROL (VITAMIN D-3 PO)    Take 1 tablet by mouth 2 (two) times daily.   CLOBETASOL (TEMOVATE) 0.05 % GEL    Apply 1 Application topically 2 (two) times daily.   DAPAGLIFLOZIN PROPANEDIOL (FARXIGA) 10 MG TABS TABLET    Take 1 tablet (10 mg total) by mouth daily before breakfast.   EZETIMIBE (ZETIA) 10 MG TABLET    Take 1 tablet (10 mg total) by mouth daily.   FUROSEMIDE (LASIX) 20 MG TABLET    Take 40 mg by mouth every Monday, Wednesday, and Friday. Prescribed By Dr.Foster, [name] [facility])   Takes two tablets  Mon, Wed, and Friday   LATANOPROST (XALATAN) 0.005 % OPHTHALMIC SOLUTION    Place 1 drop into both eyes at bedtime.    LOSARTAN (COZAAR) 100 MG TABLET    TAKE 1 TABLET EVERY DAY   MULTIPLE VITAMINS-MINERALS (OCUVITE ADULT 50+) CAPS    Take 1 capsule by mouth daily.   ROSUVASTATIN (CRESTOR) 40 MG TABLET    TAKE 1 TABLET EVERY DAY (DISCONTINUE PRAVASTATIN)   TIMOLOL (BETIMOL) 0.5 % OPHTHALMIC SOLUTION    Place 1 drop into both eyes every morning.   Modified Medications   No medications on file  Discontinued Medications   No medications on file    Physical Exam:  There were no vitals filed for this visit. There is no height or weight on file to calculate  BMI. Wt Readings from Last 3 Encounters:  06/21/23 160 lb (72.6 kg)  05/11/23 157 lb 12.8 oz (71.6 kg)  03/24/23 159 lb 14.4 oz (72.5 kg)    Physical Exam Constitutional:      Appearance: Normal appearance.  HENT:     Head: Normocephalic and atraumatic.  Cardiovascular:     Rate and Rhythm: Normal rate and regular rhythm.     Heart sounds: Murmur heard.  Pulmonary:     Effort: No respiratory distress.  Breath sounds: No stridor. No wheezing or rales.  Abdominal:     General: Bowel sounds are normal. There is no distension.     Palpations: Abdomen is soft.     Tenderness: There is no abdominal tenderness. There is no right CVA tenderness or guarding.  Musculoskeletal:        General: No swelling.  Neurological:     Mental Status: He is alert. Mental status is at baseline.     Sensory: No sensory deficit.     Motor: No weakness.     Labs reviewed: Basic Metabolic Panel: Recent Labs    11/12/22 0113 11/13/22 0534 11/16/22 1111 11/18/22 0945 12/03/22 1504  NA 141   < > 136 139 139  K 4.7   < > 4.4 4.2 4.7  CL 115*   < > 111 110 106  CO2 18*   < > 17* 21 24  GLUCOSE 137*   < > 137* 148* 87  BUN 59*   < > 26* 25 38*  CREATININE 1.93*   < > 1.83* 1.61* 1.58*  CALCIUM 7.8*   < > 7.9* 8.3* 9.7  MG 2.1  --   --   --   --    < > = values in this interval not displayed.   Liver Function Tests: Recent Labs    07/14/22 0000 11/10/22 2138 11/16/22 1111 11/18/22 0945  AST  --  28 42* 24  ALT  --  26 29 21   ALKPHOS  --  46 60  --   BILITOT  --  0.2* 0.4 0.3  PROT  --  6.3* 5.5* 5.5*  ALBUMIN 4.3 3.1* 2.5*  --    No results for input(s): "LIPASE", "AMYLASE" in the last 8760 hours. No results for input(s): "AMMONIA" in the last 8760 hours. CBC: Recent Labs    11/16/22 1111 11/18/22 0945 12/03/22 1504 12/23/22 1015  WBC 7.6 7.2 4.0 4.8  NEUTROABS 5.5 4,630 2,100  --   HGB 8.0* 8.4* 10.2* 10.8*  HCT 24.4* 26.1* 32.8* 33.7*  MCV 92.1 91.9 94.8 92.6  PLT 173  234 345 206   Lipid Panel: Recent Labs    12/23/22 1015  CHOL 116  HDL 55  LDLCALC 46  TRIG 70  CHOLHDL 2.1   TSH: No results for input(s): "TSH" in the last 8760 hours. A1C: Lab Results  Component Value Date   HGBA1C 11.6 07/14/2022     Assessment/Plan 1. Essential hypertension  Slightly high  Instructed to avoid salty foods Monitor bp , keep a log Cont with the same  2. Nonrheumatic aortic valve stenosis  Lungs clear  Cont with lasix, farxiga Avoid salty foods Follow up with cardiology  3. Gastrointestinal hemorrhage, unspecified gastrointestinal hemorrhage type Denies bloody or dark stools Will check cbc today  4. Stage 3b chronic kidney disease (HCC) Avoid nephrotoxic meds Will check cbc, bmp - CBC (no diff) - Basic Metabolic Panel with eGFR  5. 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

## 2023-06-23 LAB — CBC
HCT: 36.6 % — ABNORMAL LOW (ref 38.5–50.0)
Hemoglobin: 11.6 g/dL — ABNORMAL LOW (ref 13.2–17.1)
MCH: 29.1 pg (ref 27.0–33.0)
MCHC: 31.7 g/dL — ABNORMAL LOW (ref 32.0–36.0)
MCV: 92 fL (ref 80.0–100.0)
MPV: 10.3 fL (ref 7.5–12.5)
Platelets: 175 10*3/uL (ref 140–400)
RBC: 3.98 10*6/uL — ABNORMAL LOW (ref 4.20–5.80)
RDW: 14 % (ref 11.0–15.0)
WBC: 4.7 10*3/uL (ref 3.8–10.8)

## 2023-06-23 LAB — BASIC METABOLIC PANEL WITH GFR
BUN/Creatinine Ratio: 23 (calc) — ABNORMAL HIGH (ref 6–22)
BUN: 33 mg/dL — ABNORMAL HIGH (ref 7–25)
CO2: 27 mmol/L (ref 20–32)
Calcium: 9.4 mg/dL (ref 8.6–10.3)
Chloride: 108 mmol/L (ref 98–110)
Creat: 1.41 mg/dL — ABNORMAL HIGH (ref 0.70–1.22)
Glucose, Bld: 94 mg/dL (ref 65–139)
Potassium: 4.7 mmol/L (ref 3.5–5.3)
Sodium: 141 mmol/L (ref 135–146)
eGFR: 50 mL/min/{1.73_m2} — ABNORMAL LOW (ref >=60)

## 2023-06-25 DIAGNOSIS — R809 Proteinuria, unspecified: Secondary | ICD-10-CM | POA: Diagnosis not present

## 2023-06-25 DIAGNOSIS — I130 Hypertensive heart and chronic kidney disease with heart failure and stage 1 through stage 4 chronic kidney disease, or unspecified chronic kidney disease: Secondary | ICD-10-CM | POA: Diagnosis not present

## 2023-06-25 DIAGNOSIS — I5032 Chronic diastolic (congestive) heart failure: Secondary | ICD-10-CM | POA: Diagnosis not present

## 2023-06-25 DIAGNOSIS — N1832 Chronic kidney disease, stage 3b: Secondary | ICD-10-CM | POA: Diagnosis not present

## 2023-06-25 DIAGNOSIS — R7303 Prediabetes: Secondary | ICD-10-CM | POA: Diagnosis not present

## 2023-06-25 DIAGNOSIS — D509 Iron deficiency anemia, unspecified: Secondary | ICD-10-CM | POA: Diagnosis not present

## 2023-06-29 ENCOUNTER — Other Ambulatory Visit: Payer: Self-pay

## 2023-06-29 MED ORDER — COVID-19 MRNA VAC-TRIS(PFIZER) 30 MCG/0.3ML IM SUSY
0.3000 mL | PREFILLED_SYRINGE | Freq: Once | INTRAMUSCULAR | 0 refills | Status: AC
  Filled 2023-06-29: qty 0.3, 1d supply, fill #0

## 2023-08-06 ENCOUNTER — Ambulatory Visit (HOSPITAL_COMMUNITY): Payer: Medicare HMO | Attending: Cardiology

## 2023-08-06 ENCOUNTER — Other Ambulatory Visit: Payer: Self-pay | Admitting: Cardiology

## 2023-08-06 DIAGNOSIS — I359 Nonrheumatic aortic valve disorder, unspecified: Secondary | ICD-10-CM | POA: Diagnosis not present

## 2023-08-06 DIAGNOSIS — I350 Nonrheumatic aortic (valve) stenosis: Secondary | ICD-10-CM | POA: Diagnosis not present

## 2023-08-06 DIAGNOSIS — I351 Nonrheumatic aortic (valve) insufficiency: Secondary | ICD-10-CM | POA: Insufficient documentation

## 2023-08-06 LAB — ECHOCARDIOGRAM COMPLETE
AR max vel: 0.85 cm2
AV Area VTI: 0.83 cm2
AV Area mean vel: 0.81 cm2
AV Mean grad: 40 mm[Hg]
AV Peak grad: 77.1 mm[Hg]
Ao pk vel: 4.39 m/s
Area-P 1/2: 3.6 cm2
P 1/2 time: 309 ms
S' Lateral: 3.5 cm

## 2023-08-08 NOTE — Progress Notes (Signed)
 His aortic stenosis has slightly progressed compared to echocardiogram done about 3 to 4 months ago.  However due to aortic regurgitation that is also present, stenosis severity may be falsely elevated hence I would like to repeat echocardiogram in 6 months.  No indication for surgery at this time as he is asymptomatic. I have placed orders for echocardiogram to be done in 6 months.

## 2023-08-08 NOTE — Progress Notes (Unsigned)
 ICD-10-CM   1. Moderate aortic regurgitation  I35.1 ECHOCARDIOGRAM COMPLETE    2. Aortic valve disorder  I35.9 ECHOCARDIOGRAM COMPLETE    3. Nonrheumatic aortic valve stenosis  I35.0 ECHOCARDIOGRAM COMPLETE     Orders Placed This Encounter  Procedures   ECHOCARDIOGRAM COMPLETE    Standing Status:   Future    Expected Date:   02/05/2024    Where should this test be performed:   [facility])    Does the patient weigh less than or greater than 250 lbs?:   Patient weighs less than 250 lbs    Perflutren DEFINITY (image enhancing agent) should be administered unless hypersensitivity or allergy exist:   Administer Perflutren    Reason for exam-Echo:   Aortic stenosis I35.0    Reason for exam-Echo:   Aortic regurgitation I35.1    No orders of the defined types were placed in this encounter.

## 2023-08-11 ENCOUNTER — Other Ambulatory Visit: Payer: Self-pay | Admitting: Family Medicine

## 2023-08-11 DIAGNOSIS — L853 Xerosis cutis: Secondary | ICD-10-CM

## 2023-08-11 NOTE — Telephone Encounter (Signed)
 Patient/Pharmacy requested refill.   Pended Rx and sent to Dr. Jacquenette Shone for approval.

## 2023-08-25 DIAGNOSIS — H401123 Primary open-angle glaucoma, left eye, severe stage: Secondary | ICD-10-CM | POA: Diagnosis not present

## 2023-08-25 DIAGNOSIS — Z961 Presence of intraocular lens: Secondary | ICD-10-CM | POA: Diagnosis not present

## 2023-08-25 DIAGNOSIS — H2511 Age-related nuclear cataract, right eye: Secondary | ICD-10-CM | POA: Diagnosis not present

## 2023-08-25 DIAGNOSIS — H401112 Primary open-angle glaucoma, right eye, moderate stage: Secondary | ICD-10-CM | POA: Diagnosis not present

## 2023-08-26 ENCOUNTER — Other Ambulatory Visit: Payer: Self-pay

## 2023-09-22 ENCOUNTER — Encounter: Payer: Self-pay | Admitting: Sports Medicine

## 2023-09-22 ENCOUNTER — Ambulatory Visit (INDEPENDENT_AMBULATORY_CARE_PROVIDER_SITE_OTHER): Payer: Medicare HMO | Admitting: Sports Medicine

## 2023-09-22 ENCOUNTER — Ambulatory Visit: Payer: Medicare HMO | Admitting: Family

## 2023-09-22 VITALS — BP 128/66 | HR 83 | Temp 97.4°F | Ht 69.0 in | Wt 159.2 lb

## 2023-09-22 DIAGNOSIS — D649 Anemia, unspecified: Secondary | ICD-10-CM | POA: Diagnosis not present

## 2023-09-22 DIAGNOSIS — I69351 Hemiplegia and hemiparesis following cerebral infarction affecting right dominant side: Secondary | ICD-10-CM | POA: Diagnosis not present

## 2023-09-22 DIAGNOSIS — N1832 Chronic kidney disease, stage 3b: Secondary | ICD-10-CM | POA: Diagnosis not present

## 2023-09-22 DIAGNOSIS — I10 Essential (primary) hypertension: Secondary | ICD-10-CM

## 2023-09-22 DIAGNOSIS — J309 Allergic rhinitis, unspecified: Secondary | ICD-10-CM

## 2023-09-22 DIAGNOSIS — I5032 Chronic diastolic (congestive) heart failure: Secondary | ICD-10-CM

## 2023-09-22 MED ORDER — LORATADINE 10 MG PO TABS: 10.0000 mg | ORAL_TABLET | Freq: Every day | ORAL | 11 refills | Status: AC

## 2023-09-22 MED ORDER — FLUTICASONE PROPIONATE 50 MCG/ACT NA SUSP: 2.0000 | Freq: Every day | NASAL | 6 refills | Status: DC

## 2023-09-22 NOTE — Progress Notes (Signed)
 Careteam: Patient Care Team: Venita Sheffield, MD as PCP - General (Internal Medicine) Yates Decamp, MD as PCP - Cardiology (Cardiology) Yates Decamp, MD as Consulting Physician (Cardiology) Ernesto Rutherford, MD as Consulting Physician (Ophthalmology) Pryor Ochoa, MD (Inactive) as Consulting Physician (Vascular Surgery) Micki Riley, MD as Consulting Physician (Neurology) Sherrie [name], MD as Consulting Physician (Ophthalmology)  PLACE OF SERVICE:  [facility] CLINIC  Advanced Directive information    Allergies  Allergen Reactions   Penicillins Other (See Comments)    Unknown reaction    Chief Complaint  Patient presents with   Medical Management of Chronic Issues    Medical Management of Chronic Issues. 3 Month follow up     Discussed the use of AI scribe software for clinical note transcription with the patient, who gave verbal consent to proceed.  History of Present Illness   [name] "[name]" is an 83 year old male who presents for a routine follow-up visit. He is accompanied by his goddaughter.  He lives with his goddaughter and is independent in all activities of daily living, including showering. No memory problems or falls  since his last visit in November.  He exercises on a stationary bike three times a week for fifteen minutes each session. He drinks two to three 16-ounce bottles of water daily and reports a good appetite, although he has lost two pounds since his last visit, with his weight fluctuating between 157 and 161 pounds. No issues with sleep, dizziness, or lightheadedness when standing or walking.  His current medications include amlodipine 5 mg, losartan 100 mg, a cholesterol medication at 40 mg, Zetia, Lasix taken every other day (Monday, Wednesday, and Friday), and Farxiga (dapagliflozin).  No chest pain, palpitations, or shortness of breath during exercise or at rest. He sleeps on his side with one pillow and does not experience nocturnal  dyspnea or swelling in his feet.  He has a history of stroke with residual weakness on the right side but denies any problems with swallowing, choking, or significant coughing. He occasionally experiences a runny nose and cough but denies sore throat or sinus pain. He uses Flonase and allergy medication as needed for nasal congestion.  He denies any urinary symptoms such as pain or blood in the urine, and there is no blood in the stool.  No mood disturbances such as depression or anxiety.         Review of Systems:  Review of Systems  Constitutional:  Negative for chills and fever.  HENT:  Negative for congestion and sore throat.   Eyes:  Negative for double vision.  Respiratory:  Negative for cough, sputum production and shortness of breath.   Cardiovascular:  Negative for chest pain, palpitations and leg swelling.  Gastrointestinal:  Negative for abdominal pain, heartburn and nausea.  Genitourinary:  Negative for dysuria, frequency and hematuria.  Musculoskeletal:  Negative for falls and myalgias.  Neurological:  Negative for dizziness, sensory change and focal weakness.   Negative unless indicated in HPI.   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)    Pure hypercholesterolemia    Unspecified vitamin D deficiency    Past Surgical History:  Procedure Laterality Date   CATARACT EXTRACTION     COLONOSCOPY WITH PROPOFOL N/A 11/12/2022   Procedure: COLONOSCOPY WITH PROPOFOL;  Surgeon: Benancio Deeds, MD;  Location: [facility] ENDOSCOPY;  Service: Gastroenterology;  Laterality: N/A;   EYE SURGERY     left eye,    cornea repair   HOT HEMOSTASIS N/A 11/12/2022   Procedure: HOT HEMOSTASIS (ARGON PLASMA COAGULATION/BICAP);  Surgeon: Benancio Deeds, MD;  Location: [facility] ENDOSCOPY;  Service: Gastroenterology;  Laterality: N/A;   PERIPHERAL VASCULAR CATHETERIZATION N/A 07/28/2016   Procedure: Lower Extremity Angiography;  Surgeon: Yates Decamp, MD;  Location: [facility] INVASIVE CV LAB;  Service: Cardiovascular;  Laterality: N/A;   Social History:   reports that he quit smoking about 52 years ago. His smoking use included cigarettes. He started smoking about 65 years ago. He has a 6.5 pack-year smoking history. He has never used smokeless tobacco. He reports that he does not drink alcohol and does not use drugs.  Family History  Problem Relation Age of Onset   Cancer Father     Medications: Patient's Medications  New Prescriptions   FLUTICASONE (FLONASE) 50 MCG/ACT NASAL SPRAY    Place 2 sprays into both nostrils daily.   LORATADINE (CLARITIN) 10 MG TABLET    Take 1 tablet (10 mg total) by mouth daily.  Previous Medications   AMLODIPINE (NORVASC) 5 MG TABLET    TAKE 1 TABLET EVERY DAY   CHOLECALCIFEROL (VITAMIN D-3 PO)    Take 1 tablet by mouth 2 (two) times daily.   CLOBETASOL (TEMOVATE) 0.05 % GEL    APPLY TO AFFECTED AREA TWICE A DAY   DAPAGLIFLOZIN PROPANEDIOL (FARXIGA) 10 MG TABS TABLET    Take 1 tablet (10 mg total) by mouth daily before breakfast.   EZETIMIBE (ZETIA) 10 MG TABLET    Take 1 tablet (10 mg total) by mouth daily.   FUROSEMIDE (LASIX) 20 MG TABLET    Take 40 mg by mouth every Monday, Wednesday, and Friday. Prescribed By Dr.Foster, [facility])   Takes two tablets  Mon, Wed, and Friday   LATANOPROST (XALATAN) 0.005 % OPHTHALMIC SOLUTION    Place 1 drop into both eyes at bedtime.    LOSARTAN (COZAAR) 100 MG TABLET    TAKE 1 TABLET EVERY DAY   MULTIPLE  VITAMINS-MINERALS (OCUVITE ADULT 50+) CAPS    Take 1 capsule by mouth daily.   ROSUVASTATIN (CRESTOR) 40 MG TABLET    TAKE 1 TABLET EVERY DAY (DISCONTINUE PRAVASTATIN)   TIMOLOL (BETIMOL) 0.5 % OPHTHALMIC SOLUTION    Place 1 drop into both eyes every morning.   Modified Medications   No medications on file  Discontinued Medications   TIMOLOL (TIMOPTIC) 0.5 % OPHTHALMIC SOLUTION    Place 1 drop into both eyes daily.    Physical Exam: Vitals:   09/22/23 1130  BP: 128/66  Pulse: 83  Temp: (!) 97.4 F (36.3 C)  SpO2: 100%  Weight: 159 lb 3.2 oz (72.2 kg)  Height: 5\' 9"  (1.753 m)   Body mass index is 23.51 kg/m. BP Readings from Last 3 Encounters:  09/22/23 128/66  06/22/23 (!) 140/80  06/21/23 (!) 134/52   Wt Readings from Last 3 Encounters:  09/22/23 159 lb 3.2 oz (72.2 kg)  06/22/23 161 lb (73 kg)  06/21/23  160 lb (72.6 kg)    Physical Exam Constitutional:      Appearance: Normal appearance.  HENT:     Head: Normocephalic and atraumatic.  Cardiovascular:     Rate and Rhythm: Normal rate and regular rhythm.     Pulses: Normal pulses.     Heart sounds: Normal heart sounds.  Pulmonary:     Effort: No respiratory distress.     Breath sounds: No stridor. No wheezing or rales.  Abdominal:     General: Bowel sounds are normal. There is no distension.     Palpations: Abdomen is soft.     Tenderness: There is no abdominal tenderness. There is no right CVA tenderness or guarding.  Musculoskeletal:        General: No swelling.  Neurological:     Mental Status: He is alert. Mental status is at baseline.     Sensory: No sensory deficit.     Motor: No weakness.     Labs reviewed: Basic Metabolic Panel: Recent Labs    11/12/22 0113 11/13/22 0534 12/03/22 1504 06/22/23 1017 09/22/23 1155  NA 141   < > 139 141 141  K 4.7   < > 4.7 4.7 4.9  CL 115*   < > 106 108 107  CO2 18*   < > 24 27 28   GLUCOSE 137*   < > 87 94 95  BUN 59*   < > 38* 33* 31*  CREATININE 1.93*    < > 1.58* 1.41* 1.46*  CALCIUM 7.8*   < > 9.7 9.4 9.6  MG 2.1  --   --   --   --    < > = values in this interval not displayed.   Liver Function Tests: Recent Labs    11/10/22 2138 11/16/22 1111 11/18/22 0945  AST 28 42* 24  ALT 26 29 21   ALKPHOS 46 60  --   BILITOT 0.2* 0.4 0.3  PROT 6.3* 5.5* 5.5*  ALBUMIN 3.1* 2.5*  --    No results for input(s): "LIPASE", "AMYLASE" in the last 8760 hours. No results for input(s): "AMMONIA" in the last 8760 hours. CBC: Recent Labs    11/16/22 1111 11/18/22 0945 12/03/22 1504 12/23/22 1015 06/22/23 1017 09/22/23 1155  WBC 7.6 7.2 4.0 4.8 4.7 4.5  NEUTROABS 5.5 4,630 2,100  --   --   --   HGB 8.0* 8.4* 10.2* 10.8* 11.6* 12.7*  HCT 24.4* 26.1* 32.8* 33.7* 36.6* 40.1  MCV 92.1 91.9 94.8 92.6 92.0 92.2  PLT 173 234 345 206 175 204   Lipid Panel: Recent Labs    12/23/22 1015  CHOL 116  HDL 55  LDLCALC 46  TRIG 70  CHOLHDL 2.1   TSH: No results for input(s): "TSH" in the last 8760 hours. A1C: Lab Results  Component Value Date   HGBA1C 11.6 07/14/2022    Assessment and Plan    Hypertension Well controlled on Amlodipine 5mg  and Losartan 100mg . No symptoms of orthostasis. -Continue current regimen.  Hyperlipidemia On Atorvastatin 40mg  and Zetia. -Continue current regimen.  Chronic Kidney Disease Improved kidney function on last labs. -Order repeat kidney function tests today.  Congestive Heart Failure On Lasix every other day (Monday, Wednesday, Friday). No symptoms of fluid overload or peripheral edema. -Continue current regimen.  Diabetes On Dapagliflozin. -Continue current regimen.  Post-Stroke Right-sided weakness, no swallowing difficulties or choking. -Continue current management.  Allergic Rhinitis Occasional runny nose. -Add Flonase as needed and consider allergy medication for nasal  congestion.  General Health Maintenance -Continue regular exercise (biking 3 times a week for 15 minutes  each). -Ensure adequate hydration (drinking 2-3 16oz bottles of water daily). -Check blood counts today.          Return in about 3 months (around 12/20/2023), or schedule for wellness visit.:

## 2023-09-23 LAB — BASIC METABOLIC PANEL WITH GFR
BUN/Creatinine Ratio: 21 (calc) (ref 6–22)
BUN: 31 mg/dL — ABNORMAL HIGH (ref 7–25)
CO2: 28 mmol/L (ref 20–32)
Calcium: 9.6 mg/dL (ref 8.6–10.3)
Chloride: 107 mmol/L (ref 98–110)
Creat: 1.46 mg/dL — ABNORMAL HIGH (ref 0.70–1.22)
Glucose, Bld: 95 mg/dL (ref 65–139)
Potassium: 4.9 mmol/L (ref 3.5–5.3)
Sodium: 141 mmol/L (ref 135–146)
eGFR: 48 mL/min/{1.73_m2} — ABNORMAL LOW (ref >=60)

## 2023-09-23 LAB — CBC
HCT: 40.1 % (ref 38.5–50.0)
Hemoglobin: 12.7 g/dL — ABNORMAL LOW (ref 13.2–17.1)
MCH: 29.2 pg (ref 27.0–33.0)
MCHC: 31.7 g/dL — ABNORMAL LOW (ref 32.0–36.0)
MCV: 92.2 fL (ref 80.0–100.0)
MPV: 10.5 fL (ref 7.5–12.5)
Platelets: 204 10*3/uL (ref 140–400)
RBC: 4.35 10*6/uL (ref 4.20–5.80)
RDW: 13.2 % (ref 11.0–15.0)
WBC: 4.5 10*3/uL (ref 3.8–10.8)

## 2023-10-05 ENCOUNTER — Encounter (INDEPENDENT_AMBULATORY_CARE_PROVIDER_SITE_OTHER): Payer: Medicare HMO | Admitting: Ophthalmology

## 2023-10-05 DIAGNOSIS — H35342 Macular cyst, hole, or pseudohole, left eye: Secondary | ICD-10-CM

## 2023-10-05 DIAGNOSIS — H43811 Vitreous degeneration, right eye: Secondary | ICD-10-CM | POA: Diagnosis not present

## 2023-10-05 DIAGNOSIS — H2511 Age-related nuclear cataract, right eye: Secondary | ICD-10-CM | POA: Diagnosis not present

## 2023-10-05 DIAGNOSIS — H401132 Primary open-angle glaucoma, bilateral, moderate stage: Secondary | ICD-10-CM

## 2023-10-05 DIAGNOSIS — I10 Essential (primary) hypertension: Secondary | ICD-10-CM

## 2023-10-05 DIAGNOSIS — H35033 Hypertensive retinopathy, bilateral: Secondary | ICD-10-CM | POA: Diagnosis not present

## 2023-10-20 ENCOUNTER — Telehealth: Payer: Self-pay

## 2023-10-20 NOTE — Telephone Encounter (Signed)
 Fax was received from [facility] [place], [facility] requesting that we submit new rx's on behalf of the patient. Per Dr.Veludandi call patient to see if he needs any refills.  Outgoing call placed to patient and it was determined that he does not need any refills at this time

## 2023-10-21 ENCOUNTER — Telehealth: Payer: Self-pay

## 2023-10-21 DIAGNOSIS — N1832 Chronic kidney disease, stage 3b: Secondary | ICD-10-CM

## 2023-10-21 MED ORDER — DAPAGLIFLOZIN PROPANEDIOL 10 MG PO TABS: 10.0000 mg | ORAL_TABLET | Freq: Every day | ORAL | 1 refills | Status: DC

## 2023-10-21 NOTE — Addendum Note (Signed)
 Addended by: Venita Sheffield on: 10/21/2023 05:23 PM   Modules accepted: Orders

## 2023-10-21 NOTE — Telephone Encounter (Signed)
 Copied from CRM [id number]. Topic: Clinical - Prescription Issue >> Oct 21, 2023 11:18 AM [name] wrote: Reason for CRM: [name], with [place] [facility] [place] [facility] called stating that she thought it was another provider that was sending the refill requests. She states that she found out that it was [name]. She states [name] is no longer with the clinic, therefore, [name] needs to complete the requests and paperwork. For further questions, please contact [name] at [phone].  Fax was received from [facility] [place], [facility] requesting that we submit new rx's on behalf of the patient. Per [name] call patient to see if he needs any refills.   Outgoing call placed to patient and it was determined that he does not need any refills at this time  Spoke with the daugther and she aware the that we have received the fax and want to asked David Sheffield, MD to send a refill [name] to [facility] [place] [facility], the pharmacy has been updated the patient chart.  The pharmacy is [facility] [phone], [place], [place] [address].  Message sent to David Sheffield, MD

## 2023-10-22 MED ORDER — DAPAGLIFLOZIN PROPANEDIOL 10 MG PO TABS: 10.0000 mg | ORAL_TABLET | Freq: Every day | ORAL | 1 refills | Status: DC

## 2023-10-22 NOTE — Addendum Note (Signed)
 Addended by: Nelda Severe A on: 10/22/2023 09:10 AM   Modules accepted: Orders

## 2023-10-22 NOTE — Telephone Encounter (Signed)
 David Sheffield, MD  [facility] Clinical15 hours ago (5:23 PM)    Marcelline Deist sent to his pharmacy      Rx was sent to wrong pharmacy. [facility] to [facility].  Rx sent as requested.

## 2023-11-16 DIAGNOSIS — Z961 Presence of intraocular lens: Secondary | ICD-10-CM | POA: Diagnosis not present

## 2023-11-16 DIAGNOSIS — H401112 Primary open-angle glaucoma, right eye, moderate stage: Secondary | ICD-10-CM | POA: Diagnosis not present

## 2023-11-16 DIAGNOSIS — H401123 Primary open-angle glaucoma, left eye, severe stage: Secondary | ICD-10-CM | POA: Diagnosis not present

## 2023-11-16 DIAGNOSIS — H2511 Age-related nuclear cataract, right eye: Secondary | ICD-10-CM | POA: Diagnosis not present

## 2023-11-23 ENCOUNTER — Encounter: Payer: Self-pay | Admitting: Sports Medicine

## 2023-11-23 ENCOUNTER — Ambulatory Visit (INDEPENDENT_AMBULATORY_CARE_PROVIDER_SITE_OTHER): Admitting: Sports Medicine

## 2023-11-23 VITALS — BP 130/80 | HR 89 | Temp 97.4°F | Resp 16 | Ht 69.0 in | Wt 162.0 lb

## 2023-11-23 DIAGNOSIS — I5032 Chronic diastolic (congestive) heart failure: Secondary | ICD-10-CM

## 2023-11-23 DIAGNOSIS — J302 Other seasonal allergic rhinitis: Secondary | ICD-10-CM | POA: Diagnosis not present

## 2023-11-23 DIAGNOSIS — D509 Iron deficiency anemia, unspecified: Secondary | ICD-10-CM | POA: Diagnosis not present

## 2023-11-23 DIAGNOSIS — N1831 Chronic kidney disease, stage 3a: Secondary | ICD-10-CM

## 2023-11-23 DIAGNOSIS — I10 Essential (primary) hypertension: Secondary | ICD-10-CM

## 2023-11-23 NOTE — Progress Notes (Signed)
 Careteam: Patient Care Team: Tye Gall, MD as PCP - General (Internal Medicine) Knox Perl, MD as PCP - Cardiology (Cardiology) Knox Perl, MD as Consulting Physician (Cardiology) Maris Sickle, MD as Consulting Physician (Ophthalmology) Palma Bob, MD (Inactive) as Consulting Physician (Vascular Surgery) Lisabeth Rider, MD as Consulting Physician (Neurology) Rexene Catching, MD as Consulting Physician (Ophthalmology)  PLACE OF SERVICE:  [facility] CLINIC  Advanced Directive information Does Patient Have a Medical Advance Directive?: No, Does patient want to make changes to medical advance directive?: No - Patient declined  Allergies  Allergen Reactions   Penicillins Other (See Comments)    Unknown reaction    Chief Complaint  Patient presents with   Form Completion     Discussed the use of AI scribe software for clinical note transcription with the patient, who gave verbal consent to proceed.  History of Present Illness  [name] "[name]" is an 83 year old male who presents for  follow up  He has a history of stroke in 2008 with residual right-sided weakness. He manages his own finances and daily activities independently, with no issues in driving, memory, or personal care. Requesting paper work to be completed for [employer].  He is currently on amlodipine  5 mg, losartan  100 mg, and Crestor  40 mg for blood pressure and cholesterol management. He also takes Farxiga  and Zetia . He was previously on baby aspirin but was discontinued by cardiology as per goddaughter. He continues to take iron pills as his anemia has improved.  He engages in physical activity by using a stationary bike for about 15 minutes, three to four times a week, without experiencing dizziness, lightheadedness, or palpitations during exercise.  He has a good appetite and has gained weight recently. No fever, night sweats, or significant changes in weight. He has not smoked or consumed alcohol for  over 40 years.  No respiratory symptoms such as cough or chest pain. Occasionally, he experiences nasal congestion but denies any post-nasal drip or heartburn.  No urinary symptoms such as dysuria or hematuria and has regular bowel movements. No recent falls, dizziness, or joint pain.  He reports good sleep and mood, with no symptoms of depression or anxiety.    Review of Systems:  Review of Systems  Constitutional:  Negative for chills and fever.  HENT:  Negative for congestion and sore throat.   Eyes:  Negative for double vision.  Respiratory:  Negative for cough, sputum production and shortness of breath.   Cardiovascular:  Negative for chest pain, palpitations and leg swelling.  Gastrointestinal:  Negative for abdominal pain, heartburn and nausea.  Genitourinary:  Negative for dysuria, frequency and hematuria.  Musculoskeletal:  Negative for falls and myalgias.  Neurological:  Negative for dizziness.   Negative unless indicated in HPI.   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)    Pure hypercholesterolemia    Unspecified vitamin D  deficiency    Past Surgical History:  Procedure Laterality Date  CATARACT EXTRACTION     COLONOSCOPY WITH PROPOFOL  N/A 11/12/2022   Procedure: COLONOSCOPY WITH PROPOFOL ;  Surgeon: Ace Holder, MD;  Location: [facility] ENDOSCOPY;  Service: Gastroenterology;  Laterality: N/A;   EYE SURGERY     left eye,    cornea repair   HOT HEMOSTASIS N/A 11/12/2022   Procedure:  HOT HEMOSTASIS (ARGON PLASMA COAGULATION/BICAP);  Surgeon: Ace Holder, MD;  Location: [facility] ENDOSCOPY;  Service: Gastroenterology;  Laterality: N/A;   PERIPHERAL VASCULAR CATHETERIZATION N/A 07/28/2016   Procedure: Lower Extremity Angiography;  Surgeon: Knox Perl, MD;  Location: [facility] INVASIVE CV LAB;  Service: Cardiovascular;  Laterality: N/A;   Social History:   reports that he quit smoking about 52 years ago. His smoking use included cigarettes. He started smoking about 65 years ago. He has a 6.5 pack-year smoking history. He has never used smokeless tobacco. He reports that he does not drink alcohol and does not use drugs.  Family History  Problem Relation Age of Onset   Cancer Father     Medications: Patient's Medications  New Prescriptions   No medications on file  Previous Medications   AMLODIPINE  (NORVASC ) 5 MG TABLET    TAKE 1 TABLET EVERY DAY   CHOLECALCIFEROL (VITAMIN D -3 PO)    Take 1 tablet by mouth 2 (two) times daily.   CLOBETASOL  (TEMOVATE ) 0.05 % GEL    APPLY TO AFFECTED AREA TWICE A DAY   CLOBETASOL  (TEMOVATE ) 0.05 % GEL    Apply 1 Application topically as needed.   DAPAGLIFLOZIN  PROPANEDIOL (FARXIGA ) 10 MG TABS TABLET    Take 1 tablet (10 mg total) by mouth daily before breakfast.   EZETIMIBE  (ZETIA ) 10 MG TABLET    Take 1 tablet (10 mg total) by mouth daily.   FERROUS SULFATE 325 (65 FE) MG EC TABLET    Take 325 mg by mouth every other day.   FLUTICASONE  (FLONASE ) 50 MCG/ACT NASAL SPRAY    Place 2 sprays into both nostrils daily.   FLUTICASONE  (FLONASE ) 50 MCG/ACT NASAL SPRAY    Place 2 sprays into both nostrils as needed for allergies or rhinitis.   FUROSEMIDE  (LASIX ) 20 MG TABLET    Take 40 mg by mouth every Monday, Wednesday, and Friday. Prescribed By Dr.Foster, Avanell Bob [facility])   Takes two tablets  Mon, Wed, and Friday   LATANOPROST  (XALATAN ) 0.005 % OPHTHALMIC SOLUTION    Place 1 drop into both eyes at bedtime.    LORATADINE  (CLARITIN ) 10 MG  TABLET    Take 1 tablet (10 mg total) by mouth daily.   LOSARTAN  (COZAAR ) 100 MG TABLET    TAKE 1 TABLET EVERY DAY   MULTIPLE VITAMINS-MINERALS (OCUVITE ADULT 50+) CAPS    Take 1 capsule by mouth daily.   ROSUVASTATIN  (CRESTOR ) 40 MG TABLET    TAKE 1 TABLET EVERY DAY (DISCONTINUE PRAVASTATIN )   TIMOLOL  (BETIMOL ) 0.5 % OPHTHALMIC SOLUTION    Place 1 drop into both eyes every morning.   Modified Medications   No medications on file  Discontinued Medications   No medications on file    Physical Exam: Vitals:   11/23/23 1021  BP: 130/80  Pulse: 89  Resp: 16  Temp: (!) 97.4 F (36.3 C)  SpO2: 98%  Weight: 162 lb (73.5 kg)  Height: 5\' 9"  (1.753 m)   Body mass index is 23.92 kg/m. BP Readings from Last 3 Encounters:  11/23/23 130/80  09/22/23 128/66  06/22/23 (!) 140/80   Wt Readings from Last 3  Encounters:  11/23/23 162 lb (73.5 kg)  09/22/23 159 lb 3.2 oz (72.2 kg)  06/22/23 161 lb (73 kg)    Physical Exam Constitutional:      Appearance: Normal appearance.  HENT:     Head: Normocephalic and atraumatic.  Cardiovascular:     Rate and Rhythm: Normal rate and regular rhythm.     Pulses: Normal pulses.     Heart sounds: Normal heart sounds.  Pulmonary:     Effort: No respiratory distress.     Breath sounds: No stridor. No wheezing or rales.  Abdominal:     General: Bowel sounds are normal. There is no distension.     Palpations: Abdomen is soft.     Tenderness: There is no abdominal tenderness. There is no right CVA tenderness or guarding.  Musculoskeletal:        General: No swelling.  Neurological:     Mental Status: He is alert. Mental status is at baseline.     Labs reviewed: Basic Metabolic Panel: Recent Labs    12/03/22 1504 06/22/23 1017 09/22/23 1155  NA 139 141 141  K 4.7 4.7 4.9  CL 106 108 107  CO2 24 27 28   GLUCOSE 87 94 95  BUN 38* 33* 31*  CREATININE 1.58* 1.41* 1.46*  CALCIUM  9.7 9.4 9.6   Liver Function Tests: No results for input(s):  "AST", "ALT", "ALKPHOS", "BILITOT", "PROT", "ALBUMIN" in the last 8760 hours. No results for input(s): "LIPASE", "AMYLASE" in the last 8760 hours. No results for input(s): "AMMONIA" in the last 8760 hours. CBC: Recent Labs    12/03/22 1504 12/23/22 1015 06/22/23 1017 09/22/23 1155  WBC 4.0 4.8 4.7 4.5  NEUTROABS 2,100  --   --   --   HGB 10.2* 10.8* 11.6* 12.7*  HCT 32.8* 33.7* 36.6* 40.1  MCV 94.8 92.6 92.0 92.2  PLT 345 206 175 204   Lipid Panel: Recent Labs    12/23/22 1015  CHOL 116  HDL 55  LDLCALC 46  TRIG 70  CHOLHDL 2.1   TSH: No results for input(s): "TSH" in the last 8760 hours. A1C: Lab Results  Component Value Date   HGBA1C 11.6 07/14/2022    Assessment and Plan Assessment & Plan  1. Chronic diastolic heart failure (HCC) (Primary) Euvolemic on exam Cont with lasix , farxiga , zetia , losartan , crestor  Avoid salty foods Exercise regularly  2. Seasonal allergic rhinitis, unspecified trigger Cont with flonase , loratidine  3. Stage 3a chronic kidney disease (HCC) Avoid nephrotoxic meds Follow up with nephrology  4. Primary hypertension At goal Cont with the same  5. Iron deficiency anemia, unspecified iron deficiency anemia type No signs of bleeding Cont with iron supplements  Other orders - clobetasol  (TEMOVATE ) 0.05 % GEL; Apply 1 Application topically as needed. - fluticasone  (FLONASE ) 50 MCG/ACT nasal spray; Place 2 sprays into both nostrils as needed for allergies or rhinitis. - ferrous sulfate 325 (65 FE) MG EC tablet; Take 325 mg by mouth every other day.

## 2023-11-24 ENCOUNTER — Telehealth: Payer: Self-pay | Admitting: Sports Medicine

## 2023-11-24 NOTE — Telephone Encounter (Signed)
 Spoke with patient to find out how he would like forms processed. Patient asked that we fax and leave a copy at the front for him to pick up.  Form placed in outgoing faxes bin for admin staff to further process (in [facility] folder). Hand written note was made  1.) Fax 2.) Copy for scanning 3.) Leave original in pick-up folder for patient

## 2023-11-24 NOTE — Telephone Encounter (Signed)
 Forms for [facility] signed and gave to Chrae.

## 2023-11-24 NOTE — Telephone Encounter (Signed)
 This has been faxed. A copy will be sent to the scan center. Original in pick-up folder for patient to pick-up.

## 2023-12-14 ENCOUNTER — Other Ambulatory Visit: Payer: Self-pay

## 2023-12-14 MED ORDER — FUROSEMIDE 20 MG PO TABS: 40.0000 mg | ORAL_TABLET | ORAL | 3 refills | Status: DC

## 2023-12-15 ENCOUNTER — Other Ambulatory Visit: Payer: Self-pay | Admitting: Family Medicine

## 2023-12-20 ENCOUNTER — Other Ambulatory Visit: Payer: Self-pay | Admitting: Sports Medicine

## 2023-12-20 DIAGNOSIS — N1832 Chronic kidney disease, stage 3b: Secondary | ICD-10-CM | POA: Diagnosis not present

## 2023-12-20 NOTE — Telephone Encounter (Unsigned)
 Copied from CRM [id number]. Topic: Clinical - Medication Refill >> Dec 20, 2023  4:02 PM Brynn Caras wrote: Medication: furosemide  (LASIX ) 20 MG tablet  Has the patient contacted their pharmacy? Yes (Agent: If no, request that the patient contact the pharmacy for the refill. If patient does not wish to contact the pharmacy document the reason why and proceed with request.) (Agent: If yes, when and what did the pharmacy advise?)  This is the patient's preferred pharmacy:  [phone] Jonette Nestle, [place] - [address] [place] [address] Phone: [phone] Fax: [phone]   Is this the correct pharmacy for this prescription? Yes If no, delete pharmacy and type the correct one.   Has the prescription been filled recently? No  Is the patient out of the medication? Yes  Has the patient been seen for an appointment in the last year OR does the patient have an upcoming appointment? No  Can we respond through MyChart? No  Agent: Please be advised that Rx refills may take up to 3 business days. We ask that you follow-up with your pharmacy.

## 2023-12-21 ENCOUNTER — Ambulatory Visit: Payer: Medicare HMO | Admitting: Sports Medicine

## 2023-12-21 NOTE — Telephone Encounter (Signed)
 Medication was refilled recently. Patient is requesting another refill. Notes of medication states "Take 2 tablets (40 mg total) by mouth every Monday, Wednesday, and Friday. Prescribed By Dr.Foster, Avanell Bob ([facility])   Takes two tablets  Mon, [place], and Friday " .. Medication pend and sent to PCP Tye Gall, MD for approval.

## 2023-12-22 ENCOUNTER — Telehealth: Payer: Self-pay

## 2023-12-22 MED ORDER — FUROSEMIDE 20 MG PO TABS: 40.0000 mg | ORAL_TABLET | ORAL | 0 refills | Status: DC

## 2023-12-22 MED ORDER — FUROSEMIDE 20 MG PO TABS: 40.0000 mg | ORAL_TABLET | ORAL | 3 refills | Status: DC

## 2023-12-22 NOTE — Telephone Encounter (Signed)
 Copied from CRM [id number]. Topic: Clinical - Prescription Issue >> Dec 22, 2023  8:58 AM Danelle Dunning F wrote: Reason for CRM:   What is the name of the person calling?   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 [facility]   Calling to follow up on a prescription request they sent in previously and have not received a response for ; Agent did notice prescription was printed, please fax over to [facility] or send over electronically (via e-scribe)  Contact information:  Direct Contact Information for [facility]  Phone: [phone]  Fax Number: [phone]   Medication in Question: furosemide  (LASIX ) 20 MG tablet  Pharmacy: [facility] - [place], [place] - [address] [place] [address] Phone: [phone] Fax: [phone] Hours: Not open 24 hours

## 2023-12-22 NOTE — Telephone Encounter (Signed)
 It appears order class was print versus normal, which sends rx electronically.   RX has now been sent correctly

## 2023-12-22 NOTE — Telephone Encounter (Signed)
 Copied from CRM [id number]. Topic: Clinical - Prescription Issue >> Dec 22, 2023  9:45 AM Lenon Radar A wrote: Reason for CRM: Patient called in regarding prescription refill for medication furosemide  (LASIX ) 20 MG tablet. Patients daughter Jyl Or called on patients behalf and began to make racial slurs after she was advised that she is not listed on his chart to speak on his behalf. She stated that when she call in its always the black people who give a hard time and that she did not have this issue before. I advised that she is not listed on his DPR, confirmed with CAL as well. She became irate and gave the phone to her dad after I got permission from him to speak with her she mentioned she did not want to talk with me. Patient asked [facility] as [facility] one time courtesy for the first prescription only and the rest of the refills for furosemide  (LASIX ) 20 MG tablet to be sent through [facility]. [facility] to [facility] [address] [place] [address] per patients request. Please advise patient if daughter is going to call on his behalf that she needs to be listed on DPR going forward.

## 2023-12-22 NOTE — Telephone Encounter (Signed)
 [name] is aware rx sent in

## 2023-12-23 DIAGNOSIS — H401123 Primary open-angle glaucoma, left eye, severe stage: Secondary | ICD-10-CM | POA: Diagnosis not present

## 2023-12-23 DIAGNOSIS — H401112 Primary open-angle glaucoma, right eye, moderate stage: Secondary | ICD-10-CM | POA: Diagnosis not present

## 2023-12-29 DIAGNOSIS — D509 Iron deficiency anemia, unspecified: Secondary | ICD-10-CM | POA: Diagnosis not present

## 2023-12-29 DIAGNOSIS — R7303 Prediabetes: Secondary | ICD-10-CM | POA: Diagnosis not present

## 2023-12-29 DIAGNOSIS — N1832 Chronic kidney disease, stage 3b: Secondary | ICD-10-CM | POA: Diagnosis not present

## 2023-12-29 DIAGNOSIS — I130 Hypertensive heart and chronic kidney disease with heart failure and stage 1 through stage 4 chronic kidney disease, or unspecified chronic kidney disease: Secondary | ICD-10-CM | POA: Diagnosis not present

## 2023-12-29 DIAGNOSIS — R809 Proteinuria, unspecified: Secondary | ICD-10-CM | POA: Diagnosis not present

## 2023-12-29 DIAGNOSIS — E875 Hyperkalemia: Secondary | ICD-10-CM | POA: Diagnosis not present

## 2023-12-29 DIAGNOSIS — I5032 Chronic diastolic (congestive) heart failure: Secondary | ICD-10-CM | POA: Diagnosis not present

## 2023-12-30 ENCOUNTER — Other Ambulatory Visit: Payer: Self-pay | Admitting: Cardiology

## 2023-12-30 DIAGNOSIS — E7800 Pure hypercholesterolemia, unspecified: Secondary | ICD-10-CM

## 2023-12-31 ENCOUNTER — Emergency Department (HOSPITAL_COMMUNITY)

## 2023-12-31 ENCOUNTER — Emergency Department (HOSPITAL_COMMUNITY)
Admission: EM | Admit: 2023-12-31 | Discharge: 2023-12-31 | Disposition: A | Discharge status: Home / Self Care | Attending: Emergency Medicine | Admitting: Emergency Medicine

## 2023-12-31 ENCOUNTER — Other Ambulatory Visit: Payer: Self-pay

## 2023-12-31 DIAGNOSIS — W19XXXA Unspecified fall, initial encounter: Secondary | ICD-10-CM

## 2023-12-31 DIAGNOSIS — S0990XA Unspecified injury of head, initial encounter: Secondary | ICD-10-CM | POA: Diagnosis not present

## 2023-12-31 DIAGNOSIS — S01112A Laceration without foreign body of left eyelid and periocular area, initial encounter: Secondary | ICD-10-CM | POA: Diagnosis not present

## 2023-12-31 DIAGNOSIS — S0181XA Laceration without foreign body of other part of head, initial encounter: Secondary | ICD-10-CM

## 2023-12-31 DIAGNOSIS — Y92510 Bank as the place of occurrence of the external cause: Secondary | ICD-10-CM | POA: Insufficient documentation

## 2023-12-31 DIAGNOSIS — W1830XA Fall on same level, unspecified, initial encounter: Secondary | ICD-10-CM | POA: Diagnosis not present

## 2023-12-31 DIAGNOSIS — I6782 Cerebral ischemia: Secondary | ICD-10-CM | POA: Diagnosis not present

## 2023-12-31 DIAGNOSIS — S0083XA Contusion of other part of head, initial encounter: Secondary | ICD-10-CM | POA: Diagnosis not present

## 2023-12-31 DIAGNOSIS — G238 Other specified degenerative diseases of basal ganglia: Secondary | ICD-10-CM | POA: Diagnosis not present

## 2023-12-31 MED ORDER — LIDOCAINE-EPINEPHRINE (PF) 2 %-1:200000 IJ SOLN
10.0000 mL | Freq: Once | INTRAMUSCULAR | Status: AC
  Administered 2023-12-31: 10 mL
  Filled 2023-12-31: qty 20

## 2023-12-31 NOTE — ED Notes (Signed)
 Family at [facility]. Registration at [facility].

## 2023-12-31 NOTE — ED Provider Notes (Signed)
 [place] EMERGENCY DEPARTMENT AT [facility] Provider Note   CSN: [id number] Arrival date & time: 12/31/23  1304     History  Chief Complaint  Patient presents with   Fall    Pt was at the bank, dropped something, bent over to pick it up and fell on face. Small lac above L eye with bump. Bleeding controlled at this time, no LOC, -thinner use     [name] is a 83 y.o. male presenting to the ED with a mechanical fall today at the bank and a laceration to his left eyebrow.  Patient denies loss of conscious.  He says he simply lost his balance after he bent over to pick something up at the back.  He is not on blood thinners.  Denies headache.  Tetanus is up-to-date through his PCP.  HPI     Home Medications Prior to Admission medications   Medication Sig Start Date End Date Taking? Authorizing Provider  amLODipine  (NORVASC ) 5 MG tablet TAKE 1 TABLET EVERY DAY 08/06/23   Knox Perl, MD  clobetasol  (TEMOVATE ) 0.05 % GEL Apply 1 Application topically as needed.    [provider]  dapagliflozin  propanediol (FARXIGA ) 10 MG TABS tablet Take 1 tablet (10 mg total) by mouth daily before breakfast. 10/22/23   Tye Gall, MD  ezetimibe  (ZETIA ) 10 MG tablet TAKE 1 TABLET (10 MG TOTAL) BY MOUTH DAILY. 12/15/23   Tye Gall, MD  ferrous sulfate 325 (65 FE) MG EC tablet Take 325 mg by mouth every other day.    [provider]  fluticasone  (FLONASE ) 50 MCG/ACT nasal spray Place 2 sprays into both nostrils as needed for allergies or rhinitis.    [provider]  furosemide  (LASIX ) 20 MG tablet Take 2 tablets (40 mg total) by mouth every Monday, Wednesday, and Friday. 12/22/23   Tye Gall, MD  latanoprost  (XALATAN ) 0.005 % ophthalmic solution Place 1 drop into both eyes at bedtime.  12/18/15   [provider]  loratadine  (CLARITIN ) 10 MG tablet Take 1 tablet (10 mg total) by mouth daily. 09/22/23   Tye Gall,  MD  losartan  (COZAAR ) 100 MG tablet TAKE 1 TABLET EVERY DAY 06/11/23   Tye Gall, MD  Multiple Vitamins-Minerals (OCUVITE ADULT 50+) CAPS Take 1 capsule by mouth daily.    [provider]  rosuvastatin  (CRESTOR ) 40 MG tablet TAKE 1 TABLET EVERY DAY (DISCONTINUE PRAVASTATIN ) 12/30/23   Knox Perl, MD  timolol  (BETIMOL ) 0.5 % ophthalmic solution Place 1 drop into both eyes every morning.     [provider]      Allergies    Penicillins    Review of Systems   Review of Systems  Physical Exam Updated Vital Signs BP (!) 168/60 (BP Location: Left Arm)   Pulse 80   Temp 98.3 F (36.8 C) (Oral)   Resp 16   SpO2 96%  Physical Exam Constitutional:      General: He is not in acute distress. HENT:     Head: Normocephalic.      Comments: 3 cm laceration gaping over left eyebrow Eyes:     Conjunctiva/sclera: Conjunctivae normal.     Pupils: Pupils are equal, round, and reactive to light.  Cardiovascular:     Rate and Rhythm: Normal rate and regular rhythm.  Pulmonary:     Effort: Pulmonary effort is normal. No respiratory distress.  Abdominal:     General: There is no distension.     Tenderness: There is  no abdominal tenderness.  Skin:    General: Skin is warm and dry.  Neurological:     General: No focal deficit present.     Mental Status: He is alert. Mental status is at baseline.  Psychiatric:        Mood and Affect: Mood normal.        Behavior: Behavior normal.     ED Results / Procedures / Treatments   Labs (all labs ordered are listed, but only abnormal results are displayed) Labs Reviewed - No data to display  EKG None  Radiology No results found.  Procedures .Laceration Repair  Date/Time: 12/31/2023 2:42 PM  Performed by: Arvilla Birmingham, MD Authorized by: Arvilla Birmingham, MD   Consent:    Consent obtained:  Verbal   Consent given by:  Patient   Risks, benefits, and alternatives were discussed: yes     Risks discussed:   Infection, pain, poor cosmetic result and poor wound healing Universal protocol:    Procedure explained and questions answered to patient or proxy's satisfaction: yes     Relevant documents present and verified: yes     Test results available: yes     Imaging studies available: yes     Required blood products, implants, devices, and special equipment available: yes     Site/side marked: yes     Immediately prior to procedure, a time out was called: yes     Patient identity confirmed:  Arm band Anesthesia:    Anesthesia method:  Local infiltration   Local anesthetic:  Lidocaine  1% w/o epi Laceration details:    Location:  Face   Face location:  L eyebrow   Length (cm):  3 Pre-procedure details:    Preparation:  Patient was prepped and draped in usual sterile fashion Exploration:    Limited defect created (wound extended): no     Imaging outcome: foreign body not noted     Wound exploration: wound explored through full range of motion     Wound extent: areolar tissue not violated, fascia not violated, no foreign body, no signs of injury, no nerve damage, no tendon damage, no underlying fracture and no vascular damage     Contaminated: no   Treatment:    Area cleansed with:  Saline   Amount of cleaning:  Standard   Irrigation solution:  Sterile saline Skin repair:    Repair method:  Sutures   Suture size:  4-0   Suture material:  Prolene   Suture technique:  Simple interrupted   Number of sutures:  4 Approximation:    Approximation:  Close Repair type:    Repair type:  Simple Post-procedure details:    Dressing:  Open (no dressing)   Procedure completion:  Tolerated well, no immediate complications     Medications Ordered in ED Medications  lidocaine -EPINEPHrine (XYLOCAINE  W/EPI) 2 %-1:200000 (PF) injection 10 mL (10 mLs Infiltration Given by Other 12/31/23 1350)    ED Course/ Medical Decision Making/ A&P Clinical Course as of 12/31/23 1603  Fri Dec 31, 2023  1603  Signed out to Dr Monnie Anthony pending follow up CT head image - if no emergent finding can tb discharged. [MT]    Clinical Course User Index [MT] Ajanee Buren, Janalyn Me, MD                                 Medical Decision Making Amount and/or Complexity of Data Reviewed  Radiology: ordered.  Risk Prescription drug management.   Patient is here with a laceration to the forehead.  No indication for CT imaging, relatively low impact from below standing level without headache or A/C use.  Doubt spinal fracture.  No evidence of infection.  Wound will be irrigated and closed with sutures, 4 prolene sutures 4-0.  CT head was obtained and pending at signout. Patient's daughter was present at the bedside to take him home.  We discussed suture removal.        Final Clinical Impression(s) / ED Diagnoses Final diagnoses:  Facial laceration, initial encounter  Fall, initial encounter  Injury of head, initial encounter    Rx / DC Orders ED Discharge Orders     None         Katheen Aslin, Janalyn Me, MD 12/31/23 [phone]

## 2023-12-31 NOTE — ED Notes (Signed)
 Daughter at [facility] speaking with MD/ EDP. Pt resting, NAD, calm, interactive. Suture cart, tray, lidocaine  at [facility].

## 2023-12-31 NOTE — Discharge Instructions (Signed)
 Your stitches need to be removed in 7 days.  This can be done at your primary care doctor's office, urgent care, or an emergency department.  Keep the stitches dry for the next 2 days, and then you can shower normally with soap and water.

## 2023-12-31 NOTE — ED Notes (Signed)
Back from CT, no changes.  

## 2023-12-31 NOTE — ED Notes (Signed)
 EDP at [facility], suturing in progress, family at [facility].

## 2023-12-31 NOTE — ED Notes (Signed)
 EDP at [facility]. Pt and "God-daughter [name]" by phone, updated. Will be back at [facility] in 10 minutes. Preparing for d/c.

## 2023-12-31 NOTE — ED Notes (Signed)
 Alert, NAD, calm, interactive, sitting on side of stretcher, scrolling on phone.

## 2023-12-31 NOTE — ED Notes (Signed)
 No changes. Pending CT results. EDP at [facility]. Pt and daughter at [facility].

## 2023-12-31 NOTE — ED Notes (Signed)
 EDP at [facility]

## 2023-12-31 NOTE — ED Notes (Signed)
 Suturing complete, NAD, calm, pending CT, daughter at [facility].

## 2023-12-31 NOTE — ED Provider Notes (Signed)
 Patient was seen by Dr. Gordon Latus.  Please see his note.  Plan was to follow-up on the CT scan.  CT scan shows a small forehead hematoma without any intracranial injury.  Stable for discharge.   Trish Furl, MD 12/31/23 [phone]

## 2023-12-31 NOTE — ED Notes (Signed)
 Sitting on edge of stretcher, talking on phone, asking about d/c plan. Updated. Delay d/t emergency in dept.

## 2024-01-07 ENCOUNTER — Encounter (HOSPITAL_COMMUNITY): Payer: Self-pay

## 2024-01-07 ENCOUNTER — Ambulatory Visit (HOSPITAL_COMMUNITY)
Admission: RE | Admit: 2024-01-07 | Discharge: 2024-01-07 | Disposition: A | Discharge status: Home / Self Care | Source: Ambulatory Visit | Attending: Sports Medicine | Admitting: Sports Medicine

## 2024-01-07 VITALS — BP 144/68 | HR 70 | Temp 98.3°F | Resp 16

## 2024-01-07 DIAGNOSIS — S0181XD Laceration without foreign body of other part of head, subsequent encounter: Secondary | ICD-10-CM | POA: Diagnosis not present

## 2024-01-07 DIAGNOSIS — Z4802 Encounter for removal of sutures: Secondary | ICD-10-CM

## 2024-01-07 NOTE — Discharge Instructions (Signed)
 We removed your stitches today. Apply a small layer of Aquaphor ointment to the wound to allow for wound to heal further. Watch for signs of infection such as redness, swelling, pus, and pain. Return to clinic if you notice signs of infection.  If you develop any new or worsening symptoms or if your symptoms do not start to improve, please return here or follow-up with your primary care provider. If your symptoms are severe, please go to the emergency room.

## 2024-01-07 NOTE — ED Triage Notes (Signed)
 Patient here today to have suture removed form forehead that were placed 1 week ago. Patient is doing well.

## 2024-01-07 NOTE — ED Provider Notes (Signed)
 [facility]    CSN: [id number] Arrival date & time: 01/07/24  1231      History   Chief Complaint Chief Complaint  Patient presents with   Suture / Staple Removal    HPI [name] is a 83 y.o. male.   [name] is a 83 y.o. male presenting for chief complaint of Suture / Staple Removal. He fell 7 days ago and was seen in the emergency room for facial laceration. Laceration was repaired with 4 sutures. He has kept laceration clean and has not noticed any signs of infection.  Denies fever, chills, and dizziness/headache. Here for suture removal.      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)    Pure hypercholesterolemia    Unspecified vitamin D  deficiency     Patient Active Problem List   Diagnosis Date Noted   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   Acute blood loss anemia 11/11/2022   Acute kidney injury (HCC) 11/11/2022   Lower GI bleed 11/10/2022   Allergic rhinitis 06/03/2021   Mild cognitive impairment with memory loss 07/22/2020   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   Moderate aortic regurgitation 11/18/2017   Aortic valve stenosis 03/14/2017   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   Claudication of lower extremity (HCC) 07/26/2016   Glaucoma 12/27/2015   Essential hypertension 12/27/2015   Anemia in chronic renal disease 11/12/2014    Hyperglycemia 11/12/2014   Carotid stenosis, symptomatic, with infarction (HCC) 06/05/2013   Occlusion and stenosis of vertebral artery 06/05/2013   Chronic diastolic heart failure (HCC) 06/05/2013   Hyperlipidemia    Chronic kidney disease, stage III (moderate) (HCC)     Past Surgical History:  Procedure Laterality Date   CATARACT EXTRACTION     COLONOSCOPY WITH PROPOFOL  N/A 11/12/2022   Procedure: COLONOSCOPY WITH PROPOFOL ;  Surgeon: Ace Holder, MD;  Location: [facility] ENDOSCOPY;  Service: Gastroenterology;  Laterality: N/A;   EYE SURGERY     left eye,    cornea repair   HOT HEMOSTASIS N/A 11/12/2022   Procedure: HOT HEMOSTASIS (ARGON PLASMA COAGULATION/BICAP);  Surgeon: Ace Holder, MD;  Location: [facility] ENDOSCOPY;  Service: Gastroenterology;  Laterality: N/A;   PERIPHERAL VASCULAR CATHETERIZATION N/A 07/28/2016   Procedure: Lower Extremity Angiography;  Surgeon: Knox Perl, MD;  Location: [facility] INVASIVE CV LAB;  Service: Cardiovascular;  Laterality: N/A;       Home Medications    Prior to Admission medications   Medication Sig Start Date End Date Taking? Authorizing Provider  amLODipine  (NORVASC ) 5 MG tablet TAKE 1 TABLET EVERY DAY 08/06/23   Knox Perl, MD  clobetasol  (TEMOVATE ) 0.05 % GEL Apply 1 Application topically as needed.    [provider]  dapagliflozin  propanediol (FARXIGA ) 10 MG TABS tablet Take 1 tablet (10 mg total) by mouth daily before breakfast. 10/22/23   Tye Gall, MD  ezetimibe  (ZETIA ) 10  MG tablet TAKE 1 TABLET (10 MG TOTAL) BY MOUTH DAILY. 12/15/23   Tye Gall, MD  ferrous sulfate 325 (65 FE) MG EC tablet Take 325 mg by mouth every other day.    [provider]  fluticasone  (FLONASE ) 50 MCG/ACT nasal spray Place 2 sprays into both nostrils as needed for allergies or rhinitis.    [provider]  furosemide  (LASIX ) 20 MG tablet Take 2 tablets (40 mg total) by mouth every Monday, Wednesday, and Friday.  12/22/23   Tye Gall, MD  latanoprost  (XALATAN ) 0.005 % ophthalmic solution Place 1 drop into both eyes at bedtime.  12/18/15   [provider]  loratadine  (CLARITIN ) 10 MG tablet Take 1 tablet (10 mg total) by mouth daily. 09/22/23   Tye Gall, MD  losartan  (COZAAR ) 100 MG tablet TAKE 1 TABLET EVERY DAY 06/11/23   Tye Gall, MD  Multiple Vitamins-Minerals (OCUVITE ADULT 50+) CAPS Take 1 capsule by mouth daily.    [provider]  rosuvastatin  (CRESTOR ) 40 MG tablet TAKE 1 TABLET EVERY DAY (DISCONTINUE PRAVASTATIN ) 12/30/23   Knox Perl, MD  timolol  (BETIMOL ) 0.5 % ophthalmic solution Place 1 drop into both eyes every morning.     [provider]    Family History Family History  Problem Relation Age of Onset   Cancer Father     Social History Social History   Tobacco Use   Smoking status: Former    Current packs/day: 0.00    Average packs/day: 0.5 packs/day for 13.0 years (6.5 ttl pk-yrs)    Types: Cigarettes    Start date: 03/08/1958    Quit date: 03/09/1971    Years since quitting: 52.8   Smokeless tobacco: Never  Vaping Use   Vaping status: Never Used  Substance Use Topics   Alcohol use: No    Alcohol/week: 0.0 standard drinks of alcohol   Drug use: Never     Allergies   Penicillins   Review of Systems Review of Systems Per HPI  Physical Exam Triage Vital Signs ED Triage Vitals  Encounter Vitals Group     BP 01/07/24 1323 (!) 144/68     Systolic BP Percentile --      Diastolic BP Percentile --      Pulse Rate 01/07/24 1323 70     Resp 01/07/24 1323 16     Temp 01/07/24 1323 98.3 F (36.8 C)     Temp Source 01/07/24 1323 Oral     SpO2 01/07/24 1323 96 %     Weight --      Height --      Head Circumference --      Peak Flow --      Pain Score 01/07/24 1322 0     Pain Loc --      Pain Education --      Exclude from Growth Chart --    No data found.  Updated Vital Signs BP (!) 144/68 (BP  Location: Right Arm)   Pulse 70   Temp 98.3 F (36.8 C) (Oral)   Resp 16   SpO2 96%   Visual Acuity Right Eye Distance:   Left Eye Distance:   Bilateral Distance:    Right Eye Near:   Left Eye Near:    Bilateral Near:     Physical Exam Vitals and nursing note reviewed.  Constitutional:      Appearance: He is not ill-appearing or toxic-appearing.  HENT:     Head: Normocephalic and atraumatic.  Right Ear: Hearing and external ear normal.     Left Ear: Hearing and external ear normal.     Nose: Nose normal.     Mouth/Throat:     Lips: Pink.  Eyes:     General: Lids are normal. Vision grossly intact. Gaze aligned appropriately.     Extraocular Movements: Extraocular movements intact.     Conjunctiva/sclera: Conjunctivae normal.  Pulmonary:     Effort: Pulmonary effort is normal.  Musculoskeletal:     Cervical back: Neck supple.  Skin:    General: Skin is warm and dry.     Capillary Refill: Capillary refill takes less than 2 seconds.     Findings: Laceration (Laceration well-approximated with 4 sutures to the left upper eyebrow.  No signs of pus, redness, nontender on exam.) present. No rash.  Neurological:     General: No focal deficit present.     Mental Status: He is alert and oriented to person, place, and time. Mental status is at baseline.     Cranial Nerves: No dysarthria or facial asymmetry.  Psychiatric:        Mood and Affect: Mood normal.        Speech: Speech normal.        Behavior: Behavior normal.        Thought Content: Thought content normal.        Judgment: Judgment normal.      UC Treatments / Results  Labs (all labs ordered are listed, but only abnormal results are displayed) Labs Reviewed - No data to display  EKG   Radiology No results found.  Procedures Procedures (including critical care time)  Medications Ordered in UC Medications - No data to display  Initial Impression / Assessment and Plan / UC Course  I have reviewed  the triage vital signs and the nursing notes.  Pertinent labs & imaging results that were available during my care of the patient were reviewed by me and considered in my medical decision making (see chart for details).   1. Visit for suture removal, facial laceration Sutures removed by nursing staff.  No signs of infection. Infection return precautions discussed. May us  Aquaphor ointment to wound PRN to allow for further wound healing.   Counseled patient on potential for adverse effects with medications prescribed/recommended today, strict ER and return-to-clinic precautions discussed, patient verbalized understanding.    Final Clinical Impressions(s) / UC Diagnoses   Final diagnoses:  Visit for suture removal  Facial laceration, subsequent encounter     Discharge Instructions      We removed your stitches today. Apply a small layer of Aquaphor ointment to the wound to allow for wound to heal further. Watch for signs of infection such as redness, swelling, pus, and pain. Return to clinic if you notice signs of infection.  If you develop any new or worsening symptoms or if your symptoms do not start to improve, please return here or follow-up with your primary care provider. If your symptoms are severe, please go to the emergency room.  ED Prescriptions   None    PDMP not reviewed this encounter.   Starlene Eaton, [place] 01/07/24 1349

## 2024-01-12 ENCOUNTER — Encounter: Payer: Self-pay | Admitting: Sports Medicine

## 2024-01-12 ENCOUNTER — Ambulatory Visit (INDEPENDENT_AMBULATORY_CARE_PROVIDER_SITE_OTHER): Admitting: Sports Medicine

## 2024-01-12 VITALS — BP 140/110 | HR 68 | Temp 99.1°F | Resp 14 | Ht 69.0 in | Wt 163.8 lb

## 2024-01-12 DIAGNOSIS — N1832 Chronic kidney disease, stage 3b: Secondary | ICD-10-CM

## 2024-01-12 DIAGNOSIS — J302 Other seasonal allergic rhinitis: Secondary | ICD-10-CM | POA: Diagnosis not present

## 2024-01-12 DIAGNOSIS — W19XXXD Unspecified fall, subsequent encounter: Secondary | ICD-10-CM

## 2024-01-12 DIAGNOSIS — I5032 Chronic diastolic (congestive) heart failure: Secondary | ICD-10-CM | POA: Diagnosis not present

## 2024-01-12 NOTE — Progress Notes (Signed)
 Careteam: Patient Care Team: Tye Gall, MD as PCP - General (Internal Medicine) Knox Perl, MD as PCP - Cardiology (Cardiology) Knox Perl, MD as Consulting Physician (Cardiology) Maris Sickle, MD as Consulting Physician (Ophthalmology) Palma Bob, MD (Inactive) as Consulting Physician (Vascular Surgery) Lisabeth Rider, MD as Consulting Physician (Neurology) Rexene Catching, MD as Consulting Physician (Ophthalmology)  PLACE OF SERVICE:  [facility]  Advanced Directive information    Allergies  Allergen Reactions   Penicillins Other (See Comments)    Unknown reaction    Chief Complaint  Patient presents with   Hospitalization Follow-up    01/07/2024     Discussed the use of AI scribe software for clinical note transcription with the patient, who gave verbal consent to proceed.  History of Present Illness  [name] is an 83 year old male with hypertension and chronic kidney disease who presents for follow-up after a recent fall.  He experienced a fall at the bank when he leaned over too far to pick something up, resulting in a loss of balance. He went to the ED and had sutures removed and the incision is healing well. Currently no pain, headaches, nausea, dizziness, or balance issues.    He has hypertension and is currently taking amlodipine  5 mg, losartan  50 mg, and Lasix  on Monday, Wednesday, and Friday. His blood pressure is slightly elevated.  He has chronic kidney disease with stable creatinine levels, currently at 1.46, slightly improved from previous measurements. He takes Farxiga  and was on iron pills every other day. His hemoglobin has improved to 12.7.  He manages hyperlipidemia with Crestor  40 mg and Zetia . No changes in his medication regimen.  He reports a good appetite, no depression, and no sleep issues. He exercises by biking three times a week. He lives independently and does not use a cane or walker. No headaches, nausea,  dizziness, joint pain, breathing issues, cough, congestion, abdominal pain, dysuria, hematuria, melena, depression, sleep issues, or vision changes.   CT head   IMPRESSION: No CT evidence of acute intracranial abnormality.   Small left forehead hematoma.   Chronic microvascular ischemic changes. Similar encephalomalacia in the left frontoparietal lobes.  Review of Systems:  Review of Systems  Constitutional:  Negative for chills and fever.  HENT:  Negative for congestion and sore throat.   Respiratory:  Negative for cough, sputum production and shortness of breath.   Cardiovascular:  Negative for chest pain, palpitations and leg swelling.  Gastrointestinal:  Negative for abdominal pain, heartburn and nausea.  Genitourinary:  Negative for dysuria, frequency and hematuria.  Musculoskeletal:  Positive for falls. Negative for myalgias.  Neurological:  Negative for dizziness, sensory change, focal weakness and headaches.   Negative unless indicated in HPI.   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)    Pure hypercholesterolemia    Unspecified vitamin D  deficiency    Past Surgical History:  Procedure Laterality Date   CATARACT EXTRACTION  COLONOSCOPY WITH PROPOFOL  N/A 11/12/2022   Procedure: COLONOSCOPY WITH PROPOFOL ;  Surgeon: Ace Holder, MD;  Location: [facility] ENDOSCOPY;  Service: Gastroenterology;  Laterality: N/A;   EYE SURGERY     left eye,    cornea repair   HOT HEMOSTASIS N/A 11/12/2022    Procedure: HOT HEMOSTASIS (ARGON PLASMA COAGULATION/BICAP);  Surgeon: Ace Holder, MD;  Location: [facility] ENDOSCOPY;  Service: Gastroenterology;  Laterality: N/A;   PERIPHERAL VASCULAR CATHETERIZATION N/A 07/28/2016   Procedure: Lower Extremity Angiography;  Surgeon: Knox Perl, MD;  Location: [facility] INVASIVE CV LAB;  Service: Cardiovascular;  Laterality: N/A;   Social History:   reports that he quit smoking about 52 years ago. His smoking use included cigarettes. He started smoking about 65 years ago. He has a 6.5 pack-year smoking history. He has never used smokeless tobacco. He reports that he does not drink alcohol and does not use drugs.  Family History  Problem Relation Age of Onset   Cancer Father     Medications: Patient's Medications  New Prescriptions   No medications on file  Previous Medications   AMLODIPINE  (NORVASC ) 5 MG TABLET    TAKE 1 TABLET EVERY DAY   CLOBETASOL  (TEMOVATE ) 0.05 % GEL    Apply 1 Application topically as needed.   DAPAGLIFLOZIN  PROPANEDIOL (FARXIGA ) 10 MG TABS TABLET    Take 1 tablet (10 mg total) by mouth daily before breakfast.   EZETIMIBE  (ZETIA ) 10 MG TABLET    TAKE 1 TABLET (10 MG TOTAL) BY MOUTH DAILY.   FERROUS SULFATE 325 (65 FE) MG EC TABLET    Take 325 mg by mouth every other day.   FLUTICASONE  (FLONASE ) 50 MCG/ACT NASAL SPRAY    Place 2 sprays into both nostrils as needed for allergies or rhinitis.   FUROSEMIDE  (LASIX ) 20 MG TABLET    Take 2 tablets (40 mg total) by mouth every Monday, Wednesday, and Friday.   LATANOPROST  (XALATAN ) 0.005 % OPHTHALMIC SOLUTION    Place 1 drop into both eyes at bedtime.    LORATADINE  (CLARITIN ) 10 MG TABLET    Take 1 tablet (10 mg total) by mouth daily.   LOSARTAN  (COZAAR ) 100 MG TABLET    TAKE 1 TABLET EVERY DAY   LOSARTAN  (COZAAR ) 50 MG TABLET    Take 50 mg by mouth daily.   MULTIPLE VITAMINS-MINERALS (OCUVITE ADULT 50+) CAPS    Take 1 capsule by mouth daily.   ROSUVASTATIN  (CRESTOR ) 40 MG TABLET    TAKE 1  TABLET EVERY DAY (DISCONTINUE PRAVASTATIN )   TIMOLOL  (BETIMOL ) 0.5 % OPHTHALMIC SOLUTION    Place 1 drop into both eyes every morning.   Modified Medications   No medications on file  Discontinued Medications   No medications on file    Physical Exam: Vitals:   01/12/24 1044  BP: (!) 142/88  Pulse: 68  Resp: (!) 8  Temp: 99.1 F (37.3 C)  SpO2: 97%  Weight: 163 lb 12.8 oz (74.3 kg)  Height: 5' 9 (1.753 m)   Body mass index is 24.19 kg/m. BP Readings from Last 3 Encounters:  01/12/24 (!) 142/88  01/07/24 (!) 144/68  12/31/23 (!) 181/69   Wt Readings from Last 3 Encounters:  01/12/24 163 lb 12.8 oz (74.3 kg)  11/23/23 162 lb (73.5 kg)  09/22/23 159 lb 3.2 oz (72.2 kg)    Physical Exam Constitutional:      Appearance: Normal appearance.  HENT:     Head: Normocephalic and atraumatic.  Cardiovascular:  Rate and Rhythm: Normal rate and regular rhythm.     Pulses: Normal pulses.     Heart sounds: Murmur heard.  Pulmonary:     Effort: No respiratory distress.     Breath sounds: No stridor. No wheezing or rales.  Abdominal:     General: Bowel sounds are normal. There is no distension.     Palpations: Abdomen is soft.     Tenderness: There is no abdominal tenderness. There is no guarding.  Musculoskeletal:        General: No swelling.  Neurological:     Mental Status: He is alert. Mental status is at baseline.     Motor: No weakness.     Labs reviewed: Basic Metabolic Panel: Recent Labs    06/22/23 1017 09/22/23 1155  NA 141 141  K 4.7 4.9  CL 108 107  CO2 27 28  GLUCOSE 94 95  BUN 33* 31*  CREATININE 1.41* 1.46*  CALCIUM  9.4 9.6   Liver Function Tests: No results for input(s): AST, ALT, ALKPHOS, BILITOT, PROT, ALBUMIN in the last 8760 hours. No results for input(s): LIPASE, AMYLASE in the last 8760 hours. No results for input(s): AMMONIA in the last 8760 hours. CBC: Recent Labs    06/22/23 1017 09/22/23 1155  WBC 4.7 4.5   HGB 11.6* 12.7*  HCT 36.6* 40.1  MCV 92.0 92.2  PLT 175 204   Lipid Panel: No results for input(s): CHOL, HDL, LDLCALC, TRIG, CHOLHDL, LDLDIRECT in the last 8760 hours. TSH: No results for input(s): TSH in the last 8760 hours. A1C: Lab Results  Component Value Date   HGBA1C 11.6 07/14/2022    Assessment and Plan Assessment & Plan  1. Fall, subsequent encounter (Primary)  Sutures removed at ED  Wound healing well No gaping or discharge  Instructed patient to use walker and increase home exercises  2. Chronic diastolic heart failure (HCC)  Euvolemic on exam today Cont with lasix   Avoid salty foods Follow up with cardiology   3. Stage 3b chronic kidney disease (HCC)  Avoid nephrotoxic meds Lab Results  Component Value Date   CREATININE 1.46 (H) 09/22/2023   CREATININE 1.41 (H) 06/22/2023   CREATININE 1.58 (H) 12/03/2022     4. Seasonal allergic rhinitis, unspecified trigger  Cont with claritin , flonase    Other orders - losartan  (COZAAR ) 50 MG tablet; Take 50 mg by mouth daily.

## 2024-01-31 DIAGNOSIS — N1832 Chronic kidney disease, stage 3b: Secondary | ICD-10-CM | POA: Diagnosis not present

## 2024-02-03 ENCOUNTER — Ambulatory Visit (HOSPITAL_COMMUNITY)
Admission: RE | Admit: 2024-02-03 | Discharge: 2024-02-03 | Disposition: A | Discharge status: Home / Self Care | Source: Ambulatory Visit | Attending: Cardiology | Admitting: Cardiology

## 2024-02-03 DIAGNOSIS — I350 Nonrheumatic aortic (valve) stenosis: Secondary | ICD-10-CM

## 2024-02-03 DIAGNOSIS — I351 Nonrheumatic aortic (valve) insufficiency: Secondary | ICD-10-CM | POA: Diagnosis not present

## 2024-02-03 LAB — ECHOCARDIOGRAM COMPLETE
AR max vel: 0.94 cm2
AV Area VTI: 0.93 cm2
AV Area mean vel: 0.92 cm2
AV Mean grad: 44 mmHg
AV Peak grad: 76 mmHg
Ao pk vel: 4.36 m/s
Area-P 1/2: 3.81 cm2
S' Lateral: 3.94 cm

## 2024-02-04 ENCOUNTER — Ambulatory Visit: Payer: Self-pay | Admitting: Cardiology

## 2024-02-04 NOTE — Progress Notes (Signed)
 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  Will continue to observe for now.

## 2024-02-14 ENCOUNTER — Ambulatory Visit: Admitting: Orthopedic Surgery

## 2024-02-14 ENCOUNTER — Encounter: Payer: Self-pay | Admitting: Orthopedic Surgery

## 2024-02-14 ENCOUNTER — Ambulatory Visit: Payer: Self-pay

## 2024-02-14 VITALS — BP 142/78 | HR 63 | Temp 97.0°F | Resp 16 | Ht 69.0 in | Wt 162.4 lb

## 2024-02-14 DIAGNOSIS — R5383 Other fatigue: Secondary | ICD-10-CM

## 2024-02-14 LAB — CBC WITH DIFFERENTIAL/PLATELET
Absolute Lymphocytes: 815 {cells}/uL — ABNORMAL LOW (ref 850–3900)
Absolute Monocytes: 522 {cells}/uL (ref 200–950)
Basophils Absolute: 32 {cells}/uL (ref 0–200)
Basophils Relative: 0.7 %
Eosinophils Absolute: 464 {cells}/uL (ref 15–500)
Eosinophils Relative: 10.3 %
HCT: 40.1 % (ref 38.5–50.0)
Hemoglobin: 12.6 g/dL — ABNORMAL LOW (ref 13.2–17.1)
MCH: 28.8 pg (ref 27.0–33.0)
MCHC: 31.4 g/dL — ABNORMAL LOW (ref 32.0–36.0)
MCV: 91.6 fL (ref 80.0–100.0)
MPV: 10.6 fL (ref 7.5–12.5)
Monocytes Relative: 11.6 %
Neutro Abs: 2669 {cells}/uL (ref 1500–7800)
Neutrophils Relative %: 59.3 %
Platelets: 187 Thousand/uL (ref 140–400)
RBC: 4.38 Million/uL (ref 4.20–5.80)
RDW: 13.8 % (ref 11.0–15.0)
Total Lymphocyte: 18.1 %
WBC: 4.5 Thousand/uL (ref 3.8–10.8)

## 2024-02-14 LAB — BASIC METABOLIC PANEL WITH GFR
BUN/Creatinine Ratio: 23 (calc) — ABNORMAL HIGH (ref 6–22)
BUN: 39 mg/dL — ABNORMAL HIGH (ref 7–25)
CO2: 25 mmol/L (ref 20–32)
Calcium: 9.7 mg/dL (ref 8.6–10.3)
Chloride: 109 mmol/L (ref 98–110)
Creat: 1.72 mg/dL — ABNORMAL HIGH (ref 0.70–1.22)
Glucose, Bld: 92 mg/dL (ref 65–99)
Potassium: 5.1 mmol/L (ref 3.5–5.3)
Sodium: 142 mmol/L (ref 135–146)
eGFR: 39 mL/min/1.73m2 — ABNORMAL LOW (ref >=60)

## 2024-02-14 LAB — TSH: TSH: 1.15 m[IU]/L (ref 0.40–4.50)

## 2024-02-14 NOTE — Patient Instructions (Signed)
 I will call will results tomorrow  Take it easy  Eat at least 2 meals daily   Ok to take naps

## 2024-02-14 NOTE — Telephone Encounter (Signed)
 FYI Only or Action Required?: FYI only for provider.  Patient was last seen in primary care on 01/12/2024 by Sherlynn Madden, MD.  Called Nurse Triage reporting Fatigue.  Symptoms began 2-3 days ago.  Interventions attempted: Rest, hydration, or home remedies.  Symptoms are: unchanged.  Triage Disposition: See HCP Within 4 Hours (Or PCP Triage)  Patient/caregiver understands and will follow disposition?: Yes  Copied from CRM [id number]. Topic: Clinical - Red Word Triage >> Feb 14, 2024  9:18 AM Zane F wrote: [facility] that prompted transfer to Nurse Triage:   Feeling very weak; fatigue; low energy  Symptoms About two-three days Reason for Disposition  [1] MODERATE weakness (e.g., interferes with work, school, normal activities) AND [2] cause unknown  (Exceptions: Weakness from acute minor illness or poor fluid intake; weakness is chronic and not worse.)  Answer Assessment - Initial Assessment Questions 1. DESCRIPTION: Describe how you are feeling.     Low energy-patient reports he tries to do activities but loses energy quickly.  2. SEVERITY: How bad is it?  Can you stand and walk?     moderate 3. ONSET: When did these symptoms begin? (e.g., hours, days, weeks, months)     Started two to three days ago 4. CAUSE: What do you think is causing the weakness or fatigue? (e.g., not drinking enough fluids, medical problem, trouble sleeping)     Unsure what could be causing it. 5. NEW MEDICINES:  Have you started on any new medicines recently? (e.g., opioid pain medicines, benzodiazepines, muscle relaxants, antidepressants, antihistamines, neuroleptics, beta blockers)     no 6. OTHER SYMPTOMS: Do you have any other symptoms? (e.g., chest pain, fever, cough, SOB, vomiting, diarrhea, bleeding, other areas of pain)     no  Protocols used: Weakness (Generalized) and Fatigue-A-AH

## 2024-02-14 NOTE — Progress Notes (Signed)
 Careteam: Patient Care Team: Sherlynn Madden, MD as PCP - General (Internal Medicine) Ladona Heinz, MD as PCP - Cardiology (Cardiology) Ladona Heinz, MD as Consulting Physician (Cardiology) Octavia Charleston, MD as Consulting Physician (Ophthalmology) Gerlean Lynwood BIRCH, MD (Inactive) as Consulting Physician (Vascular Surgery) Rosemarie Eather RAMAN, MD as Consulting Physician (Neurology) Alvia Norleen BIRCH, MD as Consulting Physician (Ophthalmology)  Seen by: Greig Cluster, AGNP-C  PLACE OF SERVICE:  [facility] CLIN[facility]  Advanced Directive information Does Patient Have a Medical Advance Directive?: No, Would patient like information on creating a medical advance directive?: No - Patient declined  Allergies  Allergen Reactions   Penicillins Other (See Comments)    Unknown reaction    Chief Complaint  Patient presents with   Fatigue    Patient complains of weakness/low energy.     HPI: Patient is a 83 y.o. male seen today for acute visit due to fatigue.    Discussed the use of AI scribe software for clinical note transcription with the patient, who gave verbal consent to proceed.  History of Present Illness    [name] is an 83 year old male who presents with low energy and weakness.  He has experienced low energy and weakness for the past couple of days, feeling tired when performing simple tasks, although he maintains his usual activities. No depression or boredom. He sleeps well at night and maintains a good appetite, consuming two meals a day, typically breakfast and dinner. His diet includes cereal, grits, eggs, [place] bacon, hot dogs, and occasionally Jeanenne Novak.  He has a history of gastrointestinal bleeding, which required several blood transfusions last year due to bleeding in his colon. He was previously on iron supplements, which were discontinued about six weeks ago after his blood levels improved. No current blood loss through the rectum, pink-tinged urine, or changes  in stool color. No dizziness, chest pain, or cold symptoms.  No history of sleep apnea, sometimes snores.        Review of Systems:  Review of Systems  Constitutional:  Positive for malaise/fatigue. Negative for fever.  HENT:  Negative for congestion and sore throat.   Eyes:  Negative for blurred vision.  Respiratory:  Negative for cough, shortness of breath and wheezing.   Cardiovascular:  Negative for chest pain and leg swelling.  Gastrointestinal:  Negative for blood in stool and heartburn.  Genitourinary:  Negative for dysuria and hematuria.  Musculoskeletal:  Negative for falls and joint pain.  Neurological:  Positive for weakness. Negative for dizziness and headaches.  Psychiatric/Behavioral:  Negative for depression. The patient is not nervous/anxious and does not have insomnia.     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)    Pure hypercholesterolemia    Unspecified vitamin D  deficiency    Past Surgical History:  Procedure Laterality Date   CATARACT EXTRACTION     COLONOSCOPY WITH PROPOFOL  N/A 11/12/2022   Procedure: COLONOSCOPY WITH PROPOFOL ;  Surgeon: Leigh Elspeth SQUIBB, MD;  Location: [facility] ENDOSCOPY;  Service: Gastroenterology;  Laterality: N/A;   EYE SURGERY     left eye,    cornea repair   HOT HEMOSTASIS N/A 11/12/2022   Procedure: HOT HEMOSTASIS (ARGON PLASMA COAGULATION/BICAP);  Surgeon: Leigh Elspeth SQUIBB, MD;  Location: [facility] ENDOSCOPY;  Service: Gastroenterology;  Laterality: N/A;    PERIPHERAL VASCULAR CATHETERIZATION N/A 07/28/2016   Procedure: Lower Extremity Angiography;  Surgeon: Gordy Bergamo, MD;  Location: [facility] INVASIVE CV LAB;  Service: Cardiovascular;  Laterality: N/A;   Social History:   reports that he quit smoking about 52 years ago. His smoking use included cigarettes. He started smoking about 65 years ago. He has a 6.5 pack-year smoking history. He has never used smokeless tobacco. He reports that he does not drink alcohol and does not use drugs.  Family History  Problem Relation Age of Onset   Cancer Father     Medications: Patient's Medications  New Prescriptions   No medications on file  Previous Medications   AMLODIPINE  (NORVASC ) 5 MG TABLET    TAKE 1 TABLET EVERY DAY   CLOBETASOL  (TEMOVATE ) 0.05 % GEL    Apply 1 Application topically as needed.   DAPAGLIFLOZIN  PROPANEDIOL (FARXIGA ) 10 MG TABS TABLET    Take 1 tablet (10 mg total) by mouth daily before breakfast.   EZETIMIBE  (ZETIA ) 10 MG TABLET    TAKE 1 TABLET (10 MG TOTAL) BY MOUTH DAILY.   FLUTICASONE  (FLONASE ) 50 MCG/ACT NASAL SPRAY    Place 2 sprays into both nostrils as needed for allergies or rhinitis.   FUROSEMIDE  (LASIX ) 20 MG TABLET    Take 2 tablets (40 mg total) by mouth every Monday, Wednesday, and Friday.   LATANOPROST  (XALATAN ) 0.005 % OPHTHALMIC SOLUTION    Place 1 drop into both eyes at bedtime.    LORATADINE  (CLARITIN ) 10 MG TABLET    Take 1 tablet (10 mg total) by mouth daily.   LOSARTAN  (COZAAR ) 50 MG TABLET    Take 50 mg by mouth daily.   MULTIPLE VITAMINS-MINERALS (OCUVITE ADULT 50+) CAPS    Take 1 capsule by mouth daily.   ROSUVASTATIN  (CRESTOR ) 40 MG TABLET    TAKE 1 TABLET EVERY DAY (DISCONTINUE PRAVASTATIN )   SODIUM BICARBONATE 650 MG TABLET    Take 650 mg by mouth daily.   TIMOLOL  (BETIMOL ) 0.5 % OPHTHALMIC SOLUTION    Place 1 drop into both eyes every morning.    TIMOLOL  (TIMOPTIC ) 0.5 % OPHTHALMIC SOLUTION    Place 1 drop into both eyes every morning.  Modified  Medications   No medications on file  Discontinued Medications   No medications on file    Physical Exam:  Vitals:   02/14/24 1404  BP: (!) 140/90  Pulse: 63  Resp: 16  Temp: (!) 97 F (36.1 C)  SpO2: 97%  Weight: 162 lb 6.4 oz (73.7 kg)  Height: 5' 9 (1.753 m)   Body mass index is 23.98 kg/m. Wt Readings from Last 3 Encounters:  02/14/24 162 lb 6.4 oz (73.7 kg)  01/12/24 163 lb 12.8 oz (74.3 kg)  11/23/23 162 lb (73.5 kg)    Physical Exam Vitals reviewed.  Constitutional:      General: He is not in acute distress. HENT:     Head: Normocephalic.  Eyes:     General:        Right eye: No discharge.        Left eye: No discharge.  Neck:     Vascular: No carotid bruit.  Cardiovascular:     Rate and Rhythm: Normal rate and regular rhythm.     Pulses: Normal pulses.     Heart sounds: Murmur heard.  Pulmonary:     Effort: Pulmonary effort is normal. No respiratory distress.     Breath sounds: Normal breath sounds. No wheezing or rales.  Abdominal:     General: Bowel sounds are normal. There is no distension.     Palpations: Abdomen is soft.     Tenderness: There is no abdominal tenderness.  Musculoskeletal:     Cervical back: Neck supple.     Right lower leg: No edema.     Left lower leg: No edema.  Lymphadenopathy:     Cervical: No cervical adenopathy.  Skin:    General: Skin is warm.     Capillary Refill: Capillary refill takes less than 2 seconds.  Neurological:     General: No focal deficit present.     Mental Status: He is alert. Mental status is at baseline.     Motor: Weakness present.     Gait: Gait abnormal.  Psychiatric:        Mood and Affect: Mood normal.     Labs reviewed: Basic Metabolic Panel: Recent Labs    06/22/23 1017 09/22/23 1155  NA 141 141  K 4.7 4.9  CL 108 107  CO2 27 28  GLUCOSE 94 95  BUN 33* 31*  CREATININE 1.41* 1.46*  CALCIUM  9.4 9.6   Liver Function Tests: No results for input(s): AST, ALT, ALKPHOS,  BILITOT, PROT, ALBUMIN in the last 8760 hours. No results for input(s): LIPASE, AMYLASE in the last 8760 hours. No results for input(s): AMMONIA in the last 8760 hours. CBC: Recent Labs    06/22/23 1017 09/22/23 1155  WBC 4.7 4.5  HGB 11.6* 12.7*  HCT 36.6* 40.1  MCV 92.0 92.2  PLT 175 204   Lipid Panel: No results for input(s): CHOL, HDL, LDLCALC, TRIG, CHOLHDL, LDLDIRECT in the last 8760 hours. TSH: No results for input(s): TSH in the last 8760 hours. A1C: Lab Results  Component Value Date   HGBA1C 11.6 07/14/2022     Assessment/Plan 1. Fatigue, unspecified type (Primary) - onset a few days ago, no recent falls - eating 2 meals daily - denies blood loss - exam unremarkable - differentials: h/o GI bleed, anemia, hypothyroidism, infection, malnutrition - CBC with Differential/Platelet - Basic Metabolic Panel with eGFR - TSH  Total time: 31 minutes. Greater than 50% of total time spent doing patient education regarding fatigue and weakness including symptom/medication management.   Next appt: Visit date not found  [name] BODILY  [facility] [phone]

## 2024-02-15 ENCOUNTER — Ambulatory Visit: Payer: Self-pay | Admitting: Orthopedic Surgery

## 2024-02-15 DIAGNOSIS — N1832 Chronic kidney disease, stage 3b: Secondary | ICD-10-CM

## 2024-02-16 ENCOUNTER — Other Ambulatory Visit: Payer: Self-pay | Admitting: Sports Medicine

## 2024-02-16 NOTE — Telephone Encounter (Signed)
 Patient has request refill on medication that hasn't been refilled by PCP Tye Gall, MD . Medication pend and sent to PCP for approval.

## 2024-02-26 ENCOUNTER — Other Ambulatory Visit: Payer: Self-pay | Admitting: Sports Medicine

## 2024-02-28 NOTE — Telephone Encounter (Signed)
 Patient has request refill on medication Lasix . Patient last given 12 tablets. Medication pend and sent to PCP Sherlynn Madden, MD for approval.

## 2024-02-29 ENCOUNTER — Ambulatory Visit: Admitting: Sports Medicine

## 2024-03-06 DIAGNOSIS — R972 Elevated prostate specific antigen [PSA]: Secondary | ICD-10-CM | POA: Diagnosis not present

## 2024-03-13 DIAGNOSIS — R3912 Poor urinary stream: Secondary | ICD-10-CM | POA: Diagnosis not present

## 2024-03-13 DIAGNOSIS — N401 Enlarged prostate with lower urinary tract symptoms: Secondary | ICD-10-CM | POA: Diagnosis not present

## 2024-03-13 DIAGNOSIS — R972 Elevated prostate specific antigen [PSA]: Secondary | ICD-10-CM | POA: Diagnosis not present

## 2024-03-21 ENCOUNTER — Encounter: Payer: Self-pay | Admitting: Sports Medicine

## 2024-03-21 ENCOUNTER — Ambulatory Visit (INDEPENDENT_AMBULATORY_CARE_PROVIDER_SITE_OTHER): Admitting: Sports Medicine

## 2024-03-21 VITALS — BP 122/88 | HR 67 | Temp 97.9°F | Resp 10 | Ht 69.0 in | Wt 164.6 lb

## 2024-03-21 DIAGNOSIS — I10 Essential (primary) hypertension: Secondary | ICD-10-CM

## 2024-03-21 DIAGNOSIS — R1900 Intra-abdominal and pelvic swelling, mass and lump, unspecified site: Secondary | ICD-10-CM

## 2024-03-21 LAB — CBC WITH DIFFERENTIAL/PLATELET
Absolute Lymphocytes: 1090 {cells}/uL (ref 850–3900)
Absolute Monocytes: 490 {cells}/uL (ref 200–950)
Basophils Absolute: 20 {cells}/uL (ref 0–200)
Basophils Relative: 0.4 %
Eosinophils Absolute: 680 {cells}/uL — ABNORMAL HIGH (ref 15–500)
Eosinophils Relative: 13.6 %
HCT: 38.1 % — ABNORMAL LOW (ref 38.5–50.0)
Hemoglobin: 11.9 g/dL — ABNORMAL LOW (ref 13.2–17.1)
MCH: 28.3 pg (ref 27.0–33.0)
MCHC: 31.2 g/dL — ABNORMAL LOW (ref 32.0–36.0)
MCV: 90.5 fL (ref 80.0–100.0)
MPV: 10.2 fL (ref 7.5–12.5)
Monocytes Relative: 9.8 %
Neutro Abs: 2720 {cells}/uL (ref 1500–7800)
Neutrophils Relative %: 54.4 %
Platelets: 209 Thousand/uL (ref 140–400)
RBC: 4.21 Million/uL (ref 4.20–5.80)
RDW: 13.8 % (ref 11.0–15.0)
Total Lymphocyte: 21.8 %
WBC: 5 Thousand/uL (ref 3.8–10.8)

## 2024-03-21 MED ORDER — AMLODIPINE BESYLATE 5 MG PO TABS: 7.5000 mg | ORAL_TABLET | Freq: Every day | ORAL | Status: DC

## 2024-03-21 NOTE — Progress Notes (Unsigned)
 Careteam: Patient Care Team: Sherlynn Madden, MD as PCP - General (Internal Medicine) Ladona Heinz, MD as PCP - Cardiology (Cardiology) Ladona Heinz, MD as Consulting Physician (Cardiology) Octavia Charleston, MD as Consulting Physician (Ophthalmology) Gerlean Lynwood BIRCH, MD (Inactive) as Consulting Physician (Vascular Surgery) Rosemarie Eather RAMAN, MD as Consulting Physician (Neurology) Alvia Norleen BIRCH, MD as Consulting Physician (Ophthalmology)  PLACE OF SERVICE:  [facility]  Advanced Directive information    Allergies  Allergen Reactions   Penicillins Other (See Comments)    Unknown reaction    Chief Complaint  Patient presents with   Acute Visit    Patient has bruise on left side of abdomen , no pain.      Discussed the use of AI scribe software for clinical note transcription with the patient, who gave verbal consent to proceed.  History of Present Illness  [name] is an 83 year old male who presents with a large bruise on his side. He is accompanied by his daughter, who is his primary caregiver.  He has a large bruise on his side, noticed by his daughter yesterday when he was taking off his shirt. There is no associated pain, and he is unaware of how long it has been present. There is no history of falls, and neither the patient nor his daughter are aware of any trauma. He does not sleep on the affected side.  No associated symptoms such as stomach pain, nausea, vomiting, fever, chest pain, shortness of breath, dizziness, night sweats, urinary issues, blood in stool, bleeding gums, sinus pain, or ear pain. His appetite is good, and he has gained two pounds since last year.  His current medications include amlodipine  7.5 mg, Farxiga , Zetia , Flonase  as needed, Lasix  on Monday, Wednesday, and Friday, loratadine  as needed, losartan  50 mg, and Crestor . He does not take baby aspirin.  He recently increased his water intake and reports feeling better since then. He  lives with his daughter, who is his primary caregiver. There is no family history of cancer.    Review of Systems:  Review of Systems  Constitutional:  Negative for chills, fever and malaise/fatigue.  HENT:  Negative for congestion and sore throat.   Respiratory:  Negative for cough, sputum production and shortness of breath.   Cardiovascular:  Negative for chest pain, palpitations and leg swelling.  Gastrointestinal:  Negative for abdominal pain, heartburn and nausea.  Genitourinary:  Negative for dysuria, frequency and hematuria.  Musculoskeletal:  Negative for falls and myalgias.  Neurological:  Negative for dizziness, sensory change and focal weakness.   Negative unless indicated in HPI.   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)    Pure hypercholesterolemia    Unspecified vitamin D  deficiency    Past Surgical History:  Procedure Laterality Date   CATARACT EXTRACTION     COLONOSCOPY WITH PROPOFOL  N/A 11/12/2022   Procedure: COLONOSCOPY WITH PROPOFOL ;  Surgeon: Leigh Elspeth SQUIBB, MD;  Location: [facility] ENDOSCOPY;  Service: Gastroenterology;  Laterality: N/A;   EYE SURGERY     left eye,    cornea repair   HOT HEMOSTASIS N/A 11/12/2022   Procedure: HOT HEMOSTASIS (ARGON PLASMA COAGULATION/BICAP);  Surgeon: Leigh Elspeth SQUIBB, MD;  Location: [facility] ENDOSCOPY;  Service: Gastroenterology;  Laterality: N/A;   PERIPHERAL VASCULAR CATHETERIZATION N/A 07/28/2016   Procedure: Lower Extremity Angiography;   Surgeon: Gordy Bergamo, MD;  Location: [facility] INVASIVE CV LAB;  Service: Cardiovascular;  Laterality: N/A;   Social History:   reports that he quit smoking about 53 years ago. His smoking use included cigarettes. He started smoking about 66 years ago. He has a 6.5 pack-year smoking history. He has never used smokeless tobacco. He reports that he does not drink alcohol and does not use drugs.  Family History  Problem Relation Age of Onset   Cancer Father     Medications: Patient's Medications  New Prescriptions   No medications on file  Previous Medications   AMLODIPINE  (NORVASC ) 5 MG TABLET    TAKE 1 TABLET EVERY DAY   CLOBETASOL  (TEMOVATE ) 0.05 % GEL    Apply 1 Application topically as needed.   DAPAGLIFLOZIN  PROPANEDIOL (FARXIGA ) 10 MG TABS TABLET    Take 1 tablet (10 mg total) by mouth daily before breakfast.   EZETIMIBE  (ZETIA ) 10 MG TABLET    TAKE 1 TABLET (10 MG TOTAL) BY MOUTH DAILY.   FLUTICASONE  (FLONASE ) 50 MCG/ACT NASAL SPRAY    PLACE 2 SPRAYS INTO BOTH NOSTRILS DAILY.   FUROSEMIDE  (LASIX ) 20 MG TABLET    TAKE 2 TABLETS (40 MG TOTAL) BY MOUTH EVERY MONDAY, WEDNESDAY, AND FRIDAY.   LATANOPROST  (XALATAN ) 0.005 % OPHTHALMIC SOLUTION    Place 1 drop into both eyes at bedtime.    LORATADINE  (CLARITIN ) 10 MG TABLET    Take 1 tablet (10 mg total) by mouth daily.   LOSARTAN  (COZAAR ) 50 MG TABLET    Take 50 mg by mouth daily.   MULTIPLE VITAMINS-MINERALS (OCUVITE ADULT 50+) CAPS    Take 1 capsule by mouth daily.   ROSUVASTATIN  (CRESTOR ) 40 MG TABLET    TAKE 1 TABLET EVERY DAY (DISCONTINUE PRAVASTATIN )   SODIUM BICARBONATE 650 MG TABLET    Take 650 mg by mouth daily.   TIMOLOL  (TIMOPTIC ) 0.5 % OPHTHALMIC SOLUTION    Place 1 drop into both eyes every morning.  Modified Medications   No medications on file  Discontinued Medications   No medications on file    Physical Exam: Vitals:   03/21/24 0838  BP: 122/88  Pulse: 67  Resp: 10  Temp: 97.9 F (36.6 C)  SpO2: 97%  Weight: 164 lb  9.6 oz (74.7 kg)  Height: 5' 9 (1.753 m)   Body mass index is 24.31 kg/m. BP Readings from Last 3 Encounters:  03/21/24 122/88  02/14/24 (!) 142/78  01/12/24 (!) 140/110   Wt Readings from Last 3 Encounters:  03/21/24 164 lb 9.6 oz (74.7 kg)  02/14/24 162 lb 6.4 oz (73.7 kg)  01/12/24 163 lb 12.8 oz (74.3 kg)    Physical Exam Constitutional:      Appearance: Normal appearance.  HENT:     Head: Normocephalic and atraumatic.  Cardiovascular:     Rate and Rhythm: Normal rate and regular rhythm.     Pulses: Normal pulses.     Heart sounds: Normal heart sounds.  Pulmonary:     Effort: No respiratory distress.     Breath sounds: No stridor. No wheezing or rales.  Abdominal:  General: Bowel sounds are normal. There is no distension.     Palpations: Abdomen is soft.     Tenderness: There is no abdominal tenderness. There is no guarding.     Comments: Left upper quadrant mass 5x 3cm oval , freely mobile With superficial skin bruising noted  Musculoskeletal:        General: No swelling.  Neurological:     Mental Status: He is alert. Mental status is at baseline.     Sensory: No sensory deficit.     Motor: No weakness.     Labs reviewed: Basic Metabolic Panel: Recent Labs    06/22/23 1017 09/22/23 1155 02/14/24 1429  NA 141 141 142  K 4.7 4.9 5.1  CL 108 107 109  CO2 27 28 25   GLUCOSE 94 95 92  BUN 33* 31* 39*  CREATININE 1.41* 1.46* 1.72*  CALCIUM  9.4 9.6 9.7  TSH  --   --  1.15   Liver Function Tests: No results for input(s): AST, ALT, ALKPHOS, BILITOT, PROT, ALBUMIN in the last 8760 hours. No results for input(s): LIPASE, AMYLASE in the last 8760 hours. No results for input(s): AMMONIA in the last 8760 hours. CBC: Recent Labs    06/22/23 1017 09/22/23 1155 02/14/24 1429  WBC 4.7 4.5 4.5  NEUTROABS  --   --  2,669  HGB 11.6* 12.7* 12.6*  HCT 36.6* 40.1 40.1  MCV 92.0 92.2 91.6  PLT 175 204 187   Lipid Panel: No results for  input(s): CHOL, HDL, LDLCALC, TRIG, CHOLHDL, LDLDIRECT in the last 8760 hours. TSH: Recent Labs    02/14/24 1429  TSH 1.15   A1C: Lab Results  Component Value Date   HGBA1C 11.6 07/14/2022    Assessment and Plan Assessment & Plan   1. Palpable abdominal mass (Primary)  Daughter noticed left sided flank mass yesterday not sure for how long it has been there  Pt has memory deficts Denies abdominal pain, fevers, hematuria Will check CT  Will order cbc - CT ABDOMEN PELVIS WO CONTRAST; Future - CBC with Differential/Platelet   HTN Daughter reports that amlodipine  dose was recently increased to 7.5 mg  Pt denies dizzy or lightheadedness Cont with amlodipine  Other orders - amLODipine  (NORVASC ) 5 MG tablet; Take 1.5 tablets (7.5 mg total) by mouth daily. Daughter states that nephrology recommended him to take 7.5 mg daily

## 2024-03-22 ENCOUNTER — Ambulatory Visit: Payer: Self-pay | Admitting: Sports Medicine

## 2024-03-22 ENCOUNTER — Encounter: Payer: Self-pay | Admitting: Sports Medicine

## 2024-03-22 DIAGNOSIS — C7951 Secondary malignant neoplasm of bone: Secondary | ICD-10-CM

## 2024-03-24 ENCOUNTER — Ambulatory Visit
Admission: RE | Admit: 2024-03-24 | Discharge: 2024-03-24 | Disposition: A | Discharge status: Home / Self Care | Source: Ambulatory Visit | Attending: Sports Medicine | Admitting: Sports Medicine

## 2024-03-24 DIAGNOSIS — K5730 Diverticulosis of large intestine without perforation or abscess without bleeding: Secondary | ICD-10-CM | POA: Diagnosis not present

## 2024-03-24 DIAGNOSIS — R1900 Intra-abdominal and pelvic swelling, mass and lump, unspecified site: Secondary | ICD-10-CM

## 2024-04-05 NOTE — Telephone Encounter (Signed)
 IMPRESSION: 1. Diffuse sclerosis of the L1 vertebral body is suspicious for metastatic involvement. 2. Markedly enlarged prostate. 3. Diffuse colonic diverticulosis without evidence of acute diverticulitis. 4. Extensive calcified atheromatous plaque of the abdominal aorta and visualized branches. 5. Extensive calcifications of the aortic valve leaflets. Please correlate for any symptoms of aortic stenosis. 6. Stranding of the subcutaneous fat of the LEFT lateral abdomen consistent with provided history of bruising. 7. No acute osseous abnormality.  Will refer to oncology

## 2024-04-06 ENCOUNTER — Telehealth: Payer: Self-pay

## 2024-04-06 NOTE — Telephone Encounter (Signed)
 Please see result notes for patient.

## 2024-04-06 NOTE — Telephone Encounter (Signed)
 Copied from CRM #[id number]. Topic: General - Other >> Apr 06, 2024  3:03 PM Miquel SAILOR wrote: Reason for CRM: Patient daughter tiffany returning call. Called and Transferred to office

## 2024-04-18 ENCOUNTER — Encounter: Payer: Self-pay | Admitting: Sports Medicine

## 2024-04-18 ENCOUNTER — Ambulatory Visit (INDEPENDENT_AMBULATORY_CARE_PROVIDER_SITE_OTHER): Admitting: Sports Medicine

## 2024-04-18 VITALS — BP 128/82 | HR 60 | Temp 98.1°F | Ht 69.0 in | Wt 163.6 lb

## 2024-04-18 DIAGNOSIS — N1832 Chronic kidney disease, stage 3b: Secondary | ICD-10-CM | POA: Diagnosis not present

## 2024-04-18 DIAGNOSIS — R634 Abnormal weight loss: Secondary | ICD-10-CM | POA: Diagnosis not present

## 2024-04-18 DIAGNOSIS — Z23 Encounter for immunization: Secondary | ICD-10-CM

## 2024-04-18 DIAGNOSIS — I10 Essential (primary) hypertension: Secondary | ICD-10-CM

## 2024-04-18 NOTE — Progress Notes (Unsigned)
 Careteam: Patient Care Team: Sherlynn Madden, MD as PCP - General (Internal Medicine) Ladona Heinz, MD as PCP - Cardiology (Cardiology) Ladona Heinz, MD as Consulting Physician (Cardiology) Octavia Charleston, MD as Consulting Physician (Ophthalmology) Gerlean Lynwood BIRCH, MD (Inactive) as Consulting Physician (Vascular Surgery) Rosemarie Eather RAMAN, MD as Consulting Physician (Neurology) Alvia Norleen BIRCH, MD as Consulting Physician (Ophthalmology)  PLACE OF SERVICE:  [facility] CLINIC  Advanced Directive information    Allergies  Allergen Reactions  . Penicillins Other (See Comments)    Unknown reaction    Chief Complaint  Patient presents with  . Medical Management of Chronic Issues    3 month follow up  pt wants to wait a little while before getting the COVID and Flu VACCINE.      Discussed the use of AI scribe software for clinical note transcription with the patient, who gave verbal consent to proceed.  History of Present Illness      Review of Systems:  ROS Negative unless indicated in HPI.   Past Medical History:  Diagnosis Date  . Anemia   . Cataract    traumatic  . Chronic diastolic heart failure (HCC)   . Chronic kidney disease, stage III (moderate) (HCC)   . Depressive disorder, not elsewhere classified   . Elevated prostate specific antigen (PSA)   . 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 Hyperlipidemia   . Hypertension   . Hypertensive kidney disease, benign   . 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 Pure hypercholesterolemia   . Unspecified vitamin D  deficiency    Past Surgical History:  Procedure Laterality Date  . CATARACT EXTRACTION    . COLONOSCOPY WITH PROPOFOL  N/A 11/12/2022   Procedure: COLONOSCOPY WITH PROPOFOL ;  Surgeon: Leigh Elspeth SQUIBB, MD;  Location: [facility] ENDOSCOPY;  Service: Gastroenterology;  Laterality: N/A;  . EYE SURGERY      left eye,    cornea repair  . HOT HEMOSTASIS N/A 11/12/2022   Procedure: HOT HEMOSTASIS (ARGON PLASMA COAGULATION/BICAP);  Surgeon: Leigh Elspeth SQUIBB, MD;  Location: [facility] ENDOSCOPY;  Service: Gastroenterology;  Laterality: N/A;  . PERIPHERAL VASCULAR CATHETERIZATION N/A 07/28/2016   Procedure: Lower Extremity Angiography;  Surgeon: Heinz Ladona, MD;  Location: [facility] INVASIVE CV LAB;  Service: Cardiovascular;  Laterality: N/A;   Social History:   reports that he quit smoking about 53 years ago. His smoking use included cigarettes. He started smoking about 66 years ago. He has a 6.5 pack-year smoking history. He has never used smokeless tobacco. He reports that he does not drink alcohol and does not use drugs.  Family History  Problem Relation Age of Onset  . Cancer Father     Medications: Patient's Medications  New Prescriptions   No medications on file  Previous Medications   AMLODIPINE  (NORVASC ) 5 MG TABLET    Take 1.5 tablets (7.5 mg total) by mouth daily. Daughter states that nephrology recommended him to take 7.5 mg daily   CLOBETASOL  (TEMOVATE ) 0.05 % GEL    Apply 1 Application topically as needed.   DAPAGLIFLOZIN  PROPANEDIOL (FARXIGA ) 10 MG TABS TABLET    Take 1 tablet (10 mg total) by mouth daily before breakfast.   EZETIMIBE  (ZETIA ) 10 MG TABLET    TAKE 1 TABLET (10 MG TOTAL) BY MOUTH DAILY.   FLUTICASONE  (FLONASE ) 50 MCG/ACT NASAL SPRAY    PLACE 2 SPRAYS INTO BOTH NOSTRILS DAILY.   FUROSEMIDE  (LASIX )  20 MG TABLET    TAKE 2 TABLETS (40 MG TOTAL) BY MOUTH EVERY MONDAY, WEDNESDAY, AND FRIDAY.   LATANOPROST  (XALATAN ) 0.005 % OPHTHALMIC SOLUTION    Place 1 drop into both eyes at bedtime.    LORATADINE  (CLARITIN ) 10 MG TABLET    Take 1 tablet (10 mg total) by mouth daily.   LOSARTAN  (COZAAR ) 50 MG TABLET    Take 50 mg by mouth daily.   MULTIPLE VITAMINS-MINERALS (OCUVITE ADULT 50+) CAPS    Take 1 capsule by mouth daily.   ROSUVASTATIN  (CRESTOR ) 40 MG TABLET    TAKE 1 TABLET EVERY DAY  (DISCONTINUE PRAVASTATIN )   SODIUM BICARBONATE 650 MG TABLET    Take 650 mg by mouth daily.   TIMOLOL  (TIMOPTIC ) 0.5 % OPHTHALMIC SOLUTION    Place 1 drop into both eyes every morning.  Modified Medications   No medications on file  Discontinued Medications   No medications on file    Physical Exam: Vitals:   04/18/24 1103  BP: 128/82  Pulse: 60  Temp: 98.1 F (36.7 C)  SpO2: 96%  Weight: 163 lb 9.6 oz (74.2 kg)  Height: 5' 9 (1.753 m)   Body mass index is 24.16 kg/m. BP Readings from Last 3 Encounters:  04/18/24 128/82  03/21/24 122/88  02/14/24 (!) 142/78   Wt Readings from Last 3 Encounters:  04/18/24 163 lb 9.6 oz (74.2 kg)  03/21/24 164 lb 9.6 oz (74.7 kg)  02/14/24 162 lb 6.4 oz (73.7 kg)    Physical Exam  Labs reviewed: Basic Metabolic Panel: Recent Labs    06/22/23 1017 09/22/23 1155 02/14/24 1429  NA 141 141 142  K 4.7 4.9 5.1  CL 108 107 109  CO2 27 28 25   GLUCOSE 94 95 92  BUN 33* 31* 39*  CREATININE 1.41* 1.46* 1.72*  CALCIUM  9.4 9.6 9.7  TSH  --   --  1.15   Liver Function Tests: No results for input(s): AST, ALT, ALKPHOS, BILITOT, PROT, ALBUMIN in the last 8760 hours. No results for input(s): LIPASE, AMYLASE in the last 8760 hours. No results for input(s): AMMONIA in the last 8760 hours. CBC: Recent Labs    09/22/23 1155 02/14/24 1429 03/21/24 0000  WBC 4.5 4.5 5.0  NEUTROABS  --  2,669 2,720  HGB 12.7* 12.6* 11.9*  HCT 40.1 40.1 38.1*  MCV 92.2 91.6 90.5  PLT 204 187 209   Lipid Panel: No results for input(s): CHOL, HDL, LDLCALC, TRIG, CHOLHDL, LDLDIRECT in the last 8760 hours. TSH: Recent Labs    02/14/24 1429  TSH 1.15   A1C: Lab Results  Component Value Date   HGBA1C 11.6 07/14/2022    Assessment and Plan Assessment & Plan       No follow-ups on file.:   Ege Muckey

## 2024-04-27 ENCOUNTER — Other Ambulatory Visit

## 2024-04-27 ENCOUNTER — Ambulatory Visit: Admitting: Hematology

## 2024-05-03 NOTE — Progress Notes (Signed)
 [name]                                          MRN: [id number]   05/03/2024   The [facility] Quality Team Specialist reviewed this patient medical record for the purposes of chart review for care gap closure. The following were reviewed: abstraction for care gap closure-controlling blood pressure.    [facility] Quality Team

## 2024-05-08 ENCOUNTER — Other Ambulatory Visit: Payer: Self-pay | Admitting: Sports Medicine

## 2024-05-09 ENCOUNTER — Telehealth: Payer: Self-pay

## 2024-05-09 NOTE — Telephone Encounter (Signed)
 Incoming fax received from [employer] (see media) indicating patient does not qualify for patient assistance.  Outgoing call placed to patient informing him of this communication. Patient verbalized understanding and was without questions at the time of call.

## 2024-05-16 ENCOUNTER — Ambulatory Visit: Payer: Medicare HMO | Admitting: Family

## 2024-05-16 ENCOUNTER — Encounter: Payer: Self-pay | Admitting: Family

## 2024-05-16 VITALS — BP 126/78 | HR 75 | Temp 97.7°F | Resp 17 | Ht 69.0 in | Wt 162.8 lb

## 2024-05-16 DIAGNOSIS — Z23 Encounter for immunization: Secondary | ICD-10-CM | POA: Diagnosis not present

## 2024-05-16 DIAGNOSIS — Z0000 Encounter for general adult medical examination without abnormal findings: Secondary | ICD-10-CM | POA: Diagnosis not present

## 2024-05-16 NOTE — Progress Notes (Signed)
 Subjective:   [name] is a 83 y.o. male who presents for Medicare Annual/Subsequent preventive examination.  Visit Complete: In person  Patient Medicare AWV questionnaire was completed by the patient on 05/16/2024; I have confirmed that all information answered by patient is correct and no changes since this date.  Cardiac Risk Factors include: advanced age (>68men, >65 women);smoking/ tobacco exposure;hypertension;dyslipidemia;male gender     Objective:    Today's Vitals   05/16/24 1131 05/16/24 1203  BP: 126/78   Pulse: 75   Resp: 17   Temp: 97.7 F (36.5 C)   SpO2: 98%   Weight: 162 lb 12.8 oz (73.8 kg)   Height: 5' 9 (1.753 m)   PainSc:  5    Body mass index is 24.04 kg/m.     05/16/2024   11:23 AM 02/14/2024    2:08 PM 12/31/2023    1:12 PM 11/23/2023   10:30 AM 06/22/2023   10:03 AM 05/11/2023    1:04 PM 03/24/2023    9:39 AM  Advanced Directives  Does Patient Have a Medical Advance Directive? No No No No No No No  Does patient want to make changes to medical advance directive?    No - Patient declined     Would patient like information on creating a medical advance directive? No - Patient declined No - Patient declined No - Patient declined  No - Patient declined  No - Patient declined    Current Medications (verified) Outpatient Encounter Medications as of 05/16/2024  Medication Sig   amLODipine  (NORVASC ) 5 MG tablet Take 1.5 tablets (7.5 mg total) by mouth daily. Daughter states that nephrology recommended him to take 7.5 mg daily   clobetasol  (TEMOVATE ) 0.05 % GEL Apply 1 Application topically as needed.   dapagliflozin  propanediol (FARXIGA ) 10 MG TABS tablet Take 1 tablet (10 mg total) by mouth daily before breakfast.   ezetimibe  (ZETIA ) 10 MG tablet TAKE 1 TABLET EVERY DAY   fluticasone  (FLONASE ) 50 MCG/ACT nasal spray PLACE 2 SPRAYS INTO BOTH NOSTRILS DAILY. (Patient taking differently: Place 2 sprays into both nostrils as needed.)   furosemide   (LASIX ) 20 MG tablet TAKE 2 TABLETS (40 MG TOTAL) BY MOUTH EVERY MONDAY, WEDNESDAY, AND FRIDAY.   latanoprost  (XALATAN ) 0.005 % ophthalmic solution Place 1 drop into both eyes at bedtime.    loratadine  (CLARITIN ) 10 MG tablet Take 1 tablet (10 mg total) by mouth daily. (Patient taking differently: Take 10 mg by mouth as needed for allergies.)   losartan  (COZAAR ) 50 MG tablet Take 50 mg by mouth daily.   Multiple Vitamins-Minerals (OCUVITE ADULT 50+) CAPS Take 1 capsule by mouth daily.   rosuvastatin  (CRESTOR ) 40 MG tablet TAKE 1 TABLET EVERY DAY (DISCONTINUE PRAVASTATIN )   sodium bicarbonate 650 MG tablet Take 650 mg by mouth daily.   timolol  (TIMOPTIC ) 0.5 % ophthalmic solution Place 1 drop into both eyes every morning.   No facility-administered encounter medications on file as of 05/16/2024.    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)    Pure hypercholesterolemia    Unspecified vitamin D  deficiency    Past Surgical History:  Procedure Laterality Date   CATARACT EXTRACTION     COLONOSCOPY WITH PROPOFOL  N/A 11/12/2022   Procedure: COLONOSCOPY WITH PROPOFOL ;  Surgeon: Leigh Elspeth SQUIBB, MD;  Location: [facility] ENDOSCOPY;  Service: Gastroenterology;  Laterality: N/A;   EYE SURGERY     left eye,    cornea repair   HOT HEMOSTASIS N/A 11/12/2022   Procedure: HOT HEMOSTASIS (ARGON PLASMA COAGULATION/BICAP);  Surgeon: Leigh Elspeth SQUIBB, MD;  Location: [facility] ENDOSCOPY;   Service: Gastroenterology;  Laterality: N/A;   PERIPHERAL VASCULAR CATHETERIZATION N/A 07/28/2016   Procedure: Lower Extremity Angiography;  Surgeon: Gordy Bergamo, MD;  Location: [facility] INVASIVE CV LAB;  Service: Cardiovascular;  Laterality: N/A;   Family History  Problem Relation Age of Onset   Cancer Father    Social History   Socioeconomic History   Marital status: Divorced    Spouse name: Not on file   Number of children: 2   Years of education: Not on file   Highest education level: Not on file  Occupational History   Not on file  Tobacco Use   Smoking status: Former    Current packs/day: 0.00    Average packs/day: 0.5 packs/day for 13.0 years (6.5 ttl pk-yrs)    Types: Cigarettes    Start date: 03/08/1958    Quit date: 03/09/1971    Years since quitting: 53.2   Smokeless tobacco: Never  Vaping Use   Vaping status: Never Used  Substance and Sexual Activity   Alcohol use: No    Alcohol/week: 0.0 standard drinks of alcohol   Drug use: Never   Sexual activity: Not on file  Other Topics Concern   Not on file  Social History Narrative   Not on file   Social Drivers of Health   Financial Resource Strain: Low Risk  (04/18/2018)   Overall Financial Resource Strain (CARDIA)    Difficulty of Paying Living Expenses: Not hard at all  Food Insecurity: No Food Insecurity (05/16/2024)   Hunger Vital Sign    Worried About Running Out of Food in the Last Year: Never true    Ran Out of Food in the Last Year: Never true  Transportation Needs: No Transportation Needs (05/16/2024)   PRAPARE - Administrator, Civil Service (Medical): No    Lack of Transportation (Non-Medical): No  Physical Activity: Insufficiently Active (04/18/2018)   Exercise Vital Sign    Days of Exercise per Week: 4 days    Minutes of Exercise per Session: 10 min  Stress: No Stress Concern Present (04/18/2018)   [employer] of Occupational Health - Occupational Stress Questionnaire    Feeling of  Stress : Not at all  Social Connections: Socially Isolated (11/23/2023)   Social Connection and Isolation Panel    Frequency of Communication with Friends and Family: Three times a week    Frequency of Social Gatherings with Friends and Family: Three times a week    Attends Religious Services: Never    Active Member of Clubs or Organizations: No    Attends Banker Meetings: Never    Marital Status: Divorced    Tobacco Counseling Counseling given: Not Answered   Clinical Intake:  Pre-visit preparation completed: No  Pain : 0-10 Pain Score: 5  (sometimes 10/10) Pain Type: Chronic pain Pain Location: Back Pain Orientation: Lower  Pain Radiating Towards: No Pain Descriptors / Indicators: Aching Pain Onset: More than a month ago Pain Frequency: Constant Pain Relieving Factors: Rest Effect of Pain on Daily Activities: Standing and walking  Pain Relieving Factors: Rest  BMI - recorded: 24.04 Nutritional Status: BMI of 19-24  Normal Nutritional Risks: None Diabetes: No  How often do you need to have someone help you when you read instructions, pamphlets, or other written materials from your doctor or pharmacy?: 1 - Never What is the last grade level you completed in school?: 1 year of college  Interpreter Needed?: No      Activities of Daily Living    05/16/2024   12:09 PM  In your present state of health, do you have any difficulty performing the following activities:  Hearing? 0  Vision? 0  Difficulty concentrating or making decisions? 0  Walking or climbing stairs? 0  Dressing or bathing? 0  Doing errands, shopping? 1  Comment daughter Ship broker and eating ? N  Using the Toilet? N  In the past six months, have you accidently leaked urine? N  Do you have problems with loss of bowel control? N  Managing your Medications? N  Managing your Finances? N  Housekeeping or managing your Housekeeping? N    Patient Care Team: Sherlynn Madden, MD as PCP - General (Internal Medicine) Ladona Heinz, MD as PCP - Cardiology (Cardiology) Ladona Heinz, MD as Consulting Physician (Cardiology) Octavia Charleston, MD as Consulting Physician (Ophthalmology) Gerlean Lynwood BIRCH, MD (Inactive) as Consulting Physician (Vascular Surgery) Rosemarie Eather RAMAN, MD as Consulting Physician (Neurology) Alvia Norleen BIRCH, MD as Consulting Physician (Ophthalmology)  Indicate any recent Medical Services you may have received from other than [facility] providers in the past year (date may be approximate).     Assessment:   This is a routine wellness examination for [name].  Hearing/Vision screen Hearing Screening - Comments:: No hearing issues. Vision Screening - Comments:: Eye exam may 2025 some issues pt has to wear glasses to drive.(Dr.GROAT)   Goals Addressed             This Visit's Progress    Maintain lifestyle   On track    Patient will maintain Lifestyle       Depression Screen    05/16/2024   11:24 AM 04/18/2024   11:24 AM 01/12/2024   10:58 AM 11/23/2023   10:42 AM 09/22/2023   11:44 AM 09/22/2023   11:34 AM 05/11/2023    1:04 PM  PHQ 2/9 Scores  PHQ - 2 Score 0 0 0 0 0 0 0    Fall Risk    05/16/2024   11:24 AM 04/18/2024   11:24 AM 01/12/2024   10:58 AM 01/12/2024   10:44 AM 11/23/2023   10:42 AM  Fall Risk   Falls in the past year? 0 0 0 1 0  Number falls in past yr:  0 0 0 0  Injury with Fall? 0 1 1 1 1   Risk for fall due to : No Fall Risks   History of fall(s)   Follow up Falls evaluation completed   Falls evaluation completed     MEDICARE RISK AT HOME: Medicare Risk at Home Any stairs in or around the home?: Yes If so, are there any without handrails?: No Home free of loose throw rugs in walkways, pet beds, electrical cords, etc?: Yes Adequate lighting in your home to reduce risk of falls?: Yes Life alert?: No Use of a cane,  walker or w/c?: No Grab bars in the bathroom?: No Shower chair or bench in shower?:  No Elevated toilet seat or a handicapped toilet?: No  TIMED UP AND GO:  Was the test performed?  Yes  Length of time to ambulate 10 feet: 20 sec Gait slow and steady without use of assistive device    Cognitive Function:    05/11/2023    1:07 PM 04/18/2018    9:58 AM 03/10/2017    9:16 AM 03/26/2016    9:21 AM  MMSE - Mini Mental State Exam  Orientation to time 4 5 5  5    Orientation to Place 5 4 5  5    Registration 3 3 3  3    Attention/ Calculation 5 5 5  5    Recall 2 1 2  2    Language- name 2 objects 2 2 2  2    Language- repeat 1 1 1 1   Language- follow 3 step command 3 3 3  2    Language- read & follow direction 1 1 1  1    Write a sentence 1 1 1  1    Copy design 1 1 1  1    Total score 28 27 29  28       Data saved with a previous flowsheet row definition        05/16/2024   11:25 AM 05/05/2022    3:33 PM 05/02/2021    9:29 AM 04/30/2020    9:21 AM 04/25/2019    9:15 AM  6CIT Screen  What Year? 0 points 0 points 0 points 0 points 0 points  What month? 0 points 0 points 0 points 0 points 0 points  What time? 0 points 0 points 0 points 0 points 0 points  Count back from 20 0 points 0 points 0 points 0 points 0 points  Months in reverse 0 points  0 points 0 points 0 points  Repeat phrase 0 points 0 points 4 points 6 points   Total Score 0 points  4 points 6 points     Immunizations Immunization History  Administered Date(s) Administered   Fluad Quad(high Dose 65+) 04/03/2019, 07/22/2020, 05/06/2021, 05/12/2022   Fluad Trivalent(High Dose 65+) 05/11/2023   INFLUENZA, HIGH DOSE SEASONAL PF 07/22/2017, 04/18/2018, 05/16/2024   Influenza Split 04/29/2010   Influenza,inj,Quad PF,6+ Mos 06/05/2013, 07/09/2014, 06/14/2015   PFIZER Comirnaty (Gray Top)Covid-19 Tri-Sucrose Vaccine 10/04/2020   PFIZER(Purple Top)SARS-COV-2 Vaccination 03/02/2020, 04/02/2020   Pfizer(Comirnaty )Fall Seasonal Vaccine 12 years and older 07/24/2022, 06/29/2023   Pneumococcal Conjugate-13 10/02/2013    Pneumococcal Polysaccharide-23 11/05/2011   Tdap 04/25/2019    TDAP status: Up to date  Flu Vaccine status: Up to date  Pneumococcal vaccine status: Up to date  Covid-19 vaccine status: Information provided on how to obtain vaccines.   Qualifies for Shingles Vaccine? Yes   Zostavax completed No   Shingrix Completed?: No.    Education has been provided regarding the importance of this vaccine. Patient has been advised to call insurance company to determine out of pocket expense if they have not yet received this vaccine. Advised may also receive vaccine at local pharmacy or Health Dept. Verbalized acceptance and understanding.  Screening Tests Health Maintenance  Topic Date Due   COVID-19 Vaccine (6 - 2025-26 season) 07/16/2024 (Originally 04/03/2024)   Zoster Vaccines- Shingrix (1 of 2) 08/16/2028 (Originally 03/14/1960)   Medicare Annual Wellness (AWV)  05/16/2025   DTaP/Tdap/Td (2 - Td or Tdap) 04/24/2029   Pneumococcal Vaccine: 50+ Years  Completed  Influenza Vaccine  Completed   Meningococcal B Vaccine  Aged Out   Hepatitis C Screening  Discontinued    Health Maintenance  There are no preventive care reminders to display for this patient.   Colorectal cancer screening: No longer required.   Lung Cancer Screening: (Low Dose CT Chest recommended if Age 63-80 years, 20 pack-year currently smoking OR have quit w/in 15years.) does not qualify.   Lung Cancer Screening Referral: N/A   Additional Screening:  Hepatitis C Screening: does not qualify; Completed N/a   Vision Screening: Recommended annual ophthalmology exams for early detection of glaucoma and other disorders of the eye. Is the patient up to date with their annual eye exam?  Yes  Who is the provider or what is the name of the office in which the patient attends annual eye exams? Dr.Groat  If pt is not established with a provider, would they like to be referred to a provider to establish care? No .   Dental  Screening: Recommended annual dental exams for proper oral hygiene  Diabetic Foot Exam: Diabetic Foot Exam: Completed N/a   Community Resource Referral / Chronic Care Management: CRR required this visit?  No   CCM required this visit?  No     Plan:     I have personally reviewed and noted the following in the patient's chart:   Medical and social history Use of alcohol, tobacco or illicit drugs  Current medications and supplements including opioid prescriptions. Patient is not currently taking opioid prescriptions. Functional ability and status Nutritional status Physical activity Advanced directives List of other physicians Hospitalizations, surgeries, and ER visits in previous 12 months Vitals Screenings to include cognitive, depression, and falls Referrals and appointments  In addition, I have reviewed and discussed with patient certain preventive protocols, quality metrics, and best practice recommendations. A written personalized care plan for preventive services as well as general preventive health recommendations were provided to patient.     Roxan JAYSON Plough, NP   05/16/2024   After Visit Summary: (In Person-Printed) AVS printed and given to the patient  Nurse Notes: Advised to get COVID-19 and Shingles vaccine at the Pharmacy

## 2024-05-16 NOTE — Patient Instructions (Addendum)
 1.Go to local pharmacy to receive Covid vaccine.  [name] , Thank you for taking time to come for your Medicare Wellness Visit. I appreciate your ongoing commitment to your health goals. Please review the following plan we discussed and let me know if I can assist you in the future.   Screening recommendations/referrals: Colonoscopy : N/A  Recommended yearly ophthalmology/optometry visit for glaucoma screening and checkup Recommended yearly dental visit for hygiene and checkup  Vaccinations: Influenza vaccine due annually in September/October Pneumococcal vaccine : Up to date  Tdap vaccine : Up to date  Shingles vaccine : Due     Advanced directives: No   Conditions/risks identified: advanced age (>38men, >56 women);smoking/ tobacco exposure;hypertension;dyslipidemia;male gender  Next appointment: 1 year   Preventive Care 83 Years and Older, Male Preventive care refers to lifestyle choices and visits with your health care provider that can promote health and wellness. What does preventive care include? A yearly physical exam. This is also called an annual well check. Dental exams once or twice a year. Routine eye exams. Ask your health care provider how often you should have your eyes checked. Personal lifestyle choices, including: Daily care of your teeth and gums. Regular physical activity. Eating a healthy diet. Avoiding tobacco and drug use. Limiting alcohol use. Practicing safe sex. Taking low doses of aspirin every day. Taking vitamin and mineral supplements as recommended by your health care provider. What happens during an annual well check? The services and screenings done by your health care provider during your annual well check will depend on your age, overall health, lifestyle risk factors, and family history of disease. Counseling  Your health care provider may ask you questions about your: Alcohol use. Tobacco use. Drug use. Emotional well-being. Home  and relationship well-being. Sexual activity. Eating habits. History of falls. Memory and ability to understand (cognition). Work and work Astronomer. Screening  You may have the following tests or measurements: Height, weight, and BMI. Blood pressure. Lipid and cholesterol levels. These may be checked every 5 years, or more frequently if you are over 18 years old. Skin check. Lung cancer screening. You may have this screening every year starting at age 54 if you have a 30-pack-year history of smoking and currently smoke or have quit within the past 15 years. Fecal occult blood test (FOBT) of the stool. You may have this test every year starting at age 96. Flexible sigmoidoscopy or colonoscopy. You may have a sigmoidoscopy every 5 years or a colonoscopy every 10 years starting at age 49. Prostate cancer screening. Recommendations will vary depending on your family history and other risks. Hepatitis C blood test. Hepatitis B blood test. Sexually transmitted disease (STD) testing. Diabetes screening. This is done by checking your blood sugar (glucose) after you have not eaten for a while (fasting). You may have this done every 1-3 years. Abdominal aortic aneurysm (AAA) screening. You may need this if you are a current or former smoker. Osteoporosis. You may be screened starting at age 56 if you are at high risk. Talk with your health care provider about your test results, treatment options, and if necessary, the need for more tests. Vaccines  Your health care provider may recommend certain vaccines, such as: Influenza vaccine. This is recommended every year. Tetanus, diphtheria, and acellular pertussis (Tdap, Td) vaccine. You may need a Td booster every 10 years. Zoster vaccine. You may need this after age 84. Pneumococcal 13-valent conjugate (PCV13) vaccine. One dose is recommended after age 108. Pneumococcal  polysaccharide (PPSV23) vaccine. One dose is recommended after age 83. Talk to  your health care provider about which screenings and vaccines you need and how often you need them. This information is not intended to replace advice given to you by your health care provider. Make sure you discuss any questions you have with your health care provider. Document Released: 08/16/2015 Document Revised: 04/08/2016 Document Reviewed: 05/21/2015 Elsevier Interactive Patient Education  2017 [employer].  Fall Prevention in the Home Falls can cause injuries. They can happen to people of all ages. There are many things you can do to make your home safe and to help prevent falls. What can I do on the outside of my home? Regularly fix the edges of walkways and driveways and fix any cracks. Remove anything that might make you trip as you walk through a door, such as a raised step or threshold. Trim any bushes or trees on the path to your home. Use bright outdoor lighting. Clear any walking paths of anything that might make someone trip, such as rocks or tools. Regularly check to see if handrails are loose or broken. Make sure that both sides of any steps have handrails. Any raised decks and porches should have guardrails on the edges. Have any leaves, snow, or ice cleared regularly. Use sand or salt on walking paths during winter. Clean up any spills in your garage right away. This includes oil or grease spills. What can I do in the bathroom? Use night lights. Install grab bars by the toilet and in the tub and shower. Do not use towel bars as grab bars. Use non-skid mats or decals in the tub or shower. If you need to sit down in the shower, use a plastic, non-slip stool. Keep the floor dry. Clean up any water that spills on the floor as soon as it happens. Remove soap buildup in the tub or shower regularly. Attach bath mats securely with double-sided non-slip rug tape. Do not have throw rugs and other things on the floor that can make you trip. What can I do in the bedroom? Use  night lights. Make sure that you have a light by your bed that is easy to reach. Do not use any sheets or blankets that are too big for your bed. They should not hang down onto the floor. Have a firm chair that has side arms. You can use this for support while you get dressed. Do not have throw rugs and other things on the floor that can make you trip. What can I do in the kitchen? Clean up any spills right away. Avoid walking on wet floors. Keep items that you use a lot in easy-to-reach places. If you need to reach something above you, use a strong step stool that has a grab bar. Keep electrical cords out of the way. Do not use floor polish or wax that makes floors slippery. If you must use wax, use non-skid floor wax. Do not have throw rugs and other things on the floor that can make you trip. What can I do with my stairs? Do not leave any items on the stairs. Make sure that there are handrails on both sides of the stairs and use them. Fix handrails that are broken or loose. Make sure that handrails are as long as the stairways. Check any carpeting to make sure that it is firmly attached to the stairs. Fix any carpet that is loose or worn. Avoid having throw rugs at the top  or bottom of the stairs. If you do have throw rugs, attach them to the floor with carpet tape. Make sure that you have a light switch at the top of the stairs and the bottom of the stairs. If you do not have them, ask someone to add them for you. What else can I do to help prevent falls? Wear shoes that: Do not have high heels. Have rubber bottoms. Are comfortable and fit you well. Are closed at the toe. Do not wear sandals. If you use a stepladder: Make sure that it is fully opened. Do not climb a closed stepladder. Make sure that both sides of the stepladder are locked into place. Ask someone to hold it for you, if possible. Clearly mark and make sure that you can see: Any grab bars or handrails. First and last  steps. Where the edge of each step is. Use tools that help you move around (mobility aids) if they are needed. These include: Canes. Walkers. Scooters. Crutches. Turn on the lights when you go into a dark area. Replace any light bulbs as soon as they burn out. Set up your furniture so you have a clear path. Avoid moving your furniture around. If any of your floors are uneven, fix them. If there are any pets around you, be aware of where they are. Review your medicines with your doctor. Some medicines can make you feel dizzy. This can increase your chance of falling. Ask your doctor what other things that you can do to help prevent falls. This information is not intended to replace advice given to you by your health care provider. Make sure you discuss any questions you have with your health care provider. Document Released: 05/16/2009 Document Revised: 12/26/2015 Document Reviewed: 08/24/2014 Elsevier Interactive Patient Education  2017 [employer].

## 2024-05-22 ENCOUNTER — Inpatient Hospital Stay: Attending: Hematology | Admitting: Hematology

## 2024-05-22 ENCOUNTER — Inpatient Hospital Stay

## 2024-05-22 VITALS — BP 128/51 | HR 59 | Temp 97.2°F | Resp 18 | Wt 160.1 lb

## 2024-05-22 DIAGNOSIS — C801 Malignant (primary) neoplasm, unspecified: Secondary | ICD-10-CM | POA: Diagnosis not present

## 2024-05-22 DIAGNOSIS — M545 Low back pain, unspecified: Secondary | ICD-10-CM | POA: Diagnosis not present

## 2024-05-22 DIAGNOSIS — Z79899 Other long term (current) drug therapy: Secondary | ICD-10-CM | POA: Insufficient documentation

## 2024-05-22 DIAGNOSIS — Z801 Family history of malignant neoplasm of trachea, bronchus and lung: Secondary | ICD-10-CM | POA: Insufficient documentation

## 2024-05-22 DIAGNOSIS — I5032 Chronic diastolic (congestive) heart failure: Secondary | ICD-10-CM | POA: Insufficient documentation

## 2024-05-22 DIAGNOSIS — N400 Enlarged prostate without lower urinary tract symptoms: Secondary | ICD-10-CM | POA: Diagnosis not present

## 2024-05-22 DIAGNOSIS — N183 Chronic kidney disease, stage 3 unspecified: Secondary | ICD-10-CM | POA: Diagnosis not present

## 2024-05-22 DIAGNOSIS — I130 Hypertensive heart and chronic kidney disease with heart failure and stage 1 through stage 4 chronic kidney disease, or unspecified chronic kidney disease: Secondary | ICD-10-CM | POA: Insufficient documentation

## 2024-05-22 DIAGNOSIS — C7951 Secondary malignant neoplasm of bone: Secondary | ICD-10-CM | POA: Diagnosis not present

## 2024-05-22 DIAGNOSIS — R972 Elevated prostate specific antigen [PSA]: Secondary | ICD-10-CM | POA: Diagnosis not present

## 2024-05-22 DIAGNOSIS — I700 Atherosclerosis of aorta: Secondary | ICD-10-CM | POA: Insufficient documentation

## 2024-05-22 DIAGNOSIS — K5730 Diverticulosis of large intestine without perforation or abscess without bleeding: Secondary | ICD-10-CM | POA: Insufficient documentation

## 2024-05-22 LAB — CBC WITH DIFFERENTIAL (CANCER CENTER ONLY)
Abs Immature Granulocytes: 0 K/uL (ref 0.00–0.07)
Basophils Absolute: 0 K/uL (ref 0.0–0.1)
Basophils Relative: 0 %
Eosinophils Absolute: 0.5 K/uL (ref 0.0–0.5)
Eosinophils Relative: 10 %
HCT: 41.4 % (ref 39.0–52.0)
Hemoglobin: 13.3 g/dL (ref 13.0–17.0)
Immature Granulocytes: 0 %
Lymphocytes Relative: 23 %
Lymphs Abs: 1 K/uL (ref 0.7–4.0)
MCH: 28.4 pg (ref 26.0–34.0)
MCHC: 32.1 g/dL (ref 30.0–36.0)
MCV: 88.5 fL (ref 80.0–100.0)
Monocytes Absolute: 0.5 K/uL (ref 0.1–1.0)
Monocytes Relative: 11 %
Neutro Abs: 2.6 K/uL (ref 1.7–7.7)
Neutrophils Relative %: 56 %
Platelet Count: 194 K/uL (ref 150–400)
RBC: 4.68 MIL/uL (ref 4.22–5.81)
RDW: 15.5 % (ref 11.5–15.5)
WBC Count: 4.6 K/uL (ref 4.0–10.5)
nRBC: 0 % (ref 0.0–0.2)

## 2024-05-22 LAB — CMP (CANCER CENTER ONLY)
ALT: 23 U/L (ref 0–44)
AST: 32 U/L (ref 15–41)
Albumin: 4.2 g/dL (ref 3.5–5.0)
Alkaline Phosphatase: 74 U/L (ref 38–126)
Anion gap: 4 — ABNORMAL LOW (ref 5–15)
BUN: 32 mg/dL — ABNORMAL HIGH (ref 8–23)
CO2: 27 mmol/L (ref 22–32)
Calcium: 10.4 mg/dL — ABNORMAL HIGH (ref 8.9–10.3)
Chloride: 109 mmol/L (ref 98–111)
Creatinine: 1.51 mg/dL — ABNORMAL HIGH (ref 0.61–1.24)
GFR, Estimated: 46 mL/min — ABNORMAL LOW (ref >60)
Glucose, Bld: 95 mg/dL (ref 70–99)
Potassium: 5 mmol/L (ref 3.5–5.1)
Sodium: 140 mmol/L (ref 135–145)
Total Bilirubin: 0.5 mg/dL (ref 0.0–1.2)
Total Protein: 8 g/dL (ref 6.5–8.1)

## 2024-05-22 LAB — TSH: TSH: 2.13 u[IU]/mL (ref 0.350–4.500)

## 2024-05-22 LAB — SEDIMENTATION RATE: Sed Rate: 58 mm/h — ABNORMAL HIGH (ref 0–16)

## 2024-05-22 LAB — VITAMIN D 25 HYDROXY (VIT D DEFICIENCY, FRACTURES): Vit D, 25-Hydroxy: 24.05 ng/mL — ABNORMAL LOW (ref 30–100)

## 2024-05-22 NOTE — Progress Notes (Signed)
 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 male who has been referred to us  by Dr. 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 is ambulating with a wheelchair, accompanied by daughter.  He received a CTA 03/24/2024 for an abdominal mass for which he was evaluated by his PCP, Sherlynn Madden, MD on 8/19. It's noted that what prompted the office visit initially is a noticeable spontaneous bruise on his left abdomen.  Denies abdominal pain, new black/bloody stools, change in bowel/urinary habits (Bowel movements 2-3x/ day), pain after eating, or unusual weight loss. Notably his weight does fluctuate, but he does eat well.  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 Bleeding treated with fulguration by argon plasma.  He reports that he is followed by Dr. Nieves, Urologist, annually for prostatomegaly and elevated PSA - in [facility] his last PSA was 14.4 in 2017. Denies ever having a prostate biopsy.  Regarding the lesion found at L1, he mentions that in 1995 he injured his back [place] when he was struck in the spine at L4-5. He says that he does  experience lower back pain if he stands for an extensive period. Denies change in breathing, new cough, other lumps/bumps, or other areas of discomfort. Does endorse weakness in his legs, which he uses a stationary bike for strengthening exercise.  Fhx of Lung Cancer in father with risk factor of being a smoker.   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)    Pure hypercholesterolemia    Unspecified vitamin D  deficiency     SURGICAL HISTORY: Past Surgical History:  Procedure Laterality Date   CATARACT EXTRACTION     COLONOSCOPY WITH PROPOFOL  N/A 11/12/2022   Procedure: COLONOSCOPY WITH PROPOFOL ;  Surgeon: Leigh Elspeth SQUIBB, MD;  Location: [facility] ENDOSCOPY;  Service: Gastroenterology;  Laterality: N/A;   EYE SURGERY     left eye,    cornea repair   HOT HEMOSTASIS N/A 11/12/2022   Procedure: HOT HEMOSTASIS (ARGON PLASMA COAGULATION/BICAP);  Surgeon: Leigh Elspeth SQUIBB, MD;  Location: [facility] ENDOSCOPY;  Service: Gastroenterology;  Laterality: N/A;   PERIPHERAL VASCULAR CATHETERIZATION N/A 07/28/2016   Procedure: Lower Extremity Angiography;  Surgeon: Heinz Ladona, MD;  Location: [facility] INVASIVE CV LAB;  Service: Cardiovascular;  Laterality: N/A;  SOCIAL HISTORY: Social History   Socioeconomic History   Marital status: Divorced    Spouse name: Not on file   Number of children: 2   Years of education: Not on file   Highest education level: Not on file  Occupational History   Not on file   Tobacco Use   Smoking status: Former    Current packs/day: 0.00    Average packs/day: 0.5 packs/day for 13.0 years (6.5 ttl pk-yrs)    Types: Cigarettes    Start date: 03/08/1958    Quit date: 03/09/1971    Years since quitting: 53.2   Smokeless tobacco: Never  Vaping Use   Vaping status: Never Used  Substance and Sexual Activity   Alcohol use: No    Alcohol/week: 0.0 standard drinks of alcohol   Drug use: Never   Sexual activity: Not on file  Other Topics Concern   Not on file  Social History Narrative   Not on file   Social Drivers of Health   Financial Resource Strain: Low Risk  (04/18/2018)   Overall Financial Resource Strain (CARDIA)    Difficulty of Paying Living Expenses: Not hard at all  Food Insecurity: No Food Insecurity (05/16/2024)   Hunger Vital Sign    Worried About Running Out of Food in the Last Year: Never true    Ran Out of Food in the Last Year: Never true  Transportation Needs: No Transportation Needs (05/16/2024)   PRAPARE - Administrator, Civil Service (Medical): No    Lack of Transportation (Non-Medical): No  Physical Activity: Insufficiently Active (04/18/2018)   Exercise Vital Sign    Days of Exercise per Week: 4 days    Minutes of Exercise per Session: 10 min  Stress: No Stress Concern Present (04/18/2018)   [employer] of Occupational Health - Occupational Stress Questionnaire    Feeling of Stress : Not at all  Social Connections: Socially Isolated (11/23/2023)   Social Connection and Isolation Panel    Frequency of Communication with Friends and Family: Three times a week    Frequency of Social Gatherings with Friends and Family: Three times a week    Attends Religious Services: Never    Active Member of Clubs or Organizations: No    Attends Banker Meetings: Never    Marital Status: Divorced  Catering Manager Violence: Not At Risk (05/16/2024)   Humiliation, Afraid, Rape, and Kick questionnaire    Fear of Current  or Ex-Partner: No    Emotionally Abused: No    Physically Abused: No    Sexually Abused: No   Social History   Social History Narrative   Not on file    FAMILY HISTORY: Family History  Problem Relation Age of Onset   Cancer Father     ALLERGIES:  is allergic to penicillins.  MEDICATIONS:  Current Outpatient Medications  Medication Sig Dispense Refill   amLODipine  (NORVASC ) 5 MG tablet Take 1.5 tablets (7.5 mg total) by mouth daily. Daughter states that nephrology recommended him to take 7.5 mg daily     clobetasol  (TEMOVATE ) 0.05 % GEL Apply 1 Application topically as needed.     dapagliflozin  propanediol (FARXIGA ) 10 MG TABS tablet Take 1 tablet (10 mg total) by mouth daily before breakfast. 90 tablet 1   ezetimibe  (ZETIA ) 10 MG tablet TAKE 1 TABLET EVERY DAY 90 tablet 3   fluticasone  (FLONASE ) 50 MCG/ACT nasal spray PLACE 2 SPRAYS INTO BOTH NOSTRILS DAILY. (Patient taking differently: Place 2 sprays  into both nostrils as needed.) 48 g 3   furosemide  (LASIX ) 20 MG tablet TAKE 2 TABLETS (40 MG TOTAL) BY MOUTH EVERY MONDAY, WEDNESDAY, AND FRIDAY. 72 tablet 3   latanoprost  (XALATAN ) 0.005 % ophthalmic solution Place 1 drop into both eyes at bedtime.      loratadine  (CLARITIN ) 10 MG tablet Take 1 tablet (10 mg total) by mouth daily. (Patient taking differently: Take 10 mg by mouth as needed for allergies.) 30 tablet 11   losartan  (COZAAR ) 50 MG tablet Take 50 mg by mouth daily.     Multiple Vitamins-Minerals (OCUVITE ADULT 50+) CAPS Take 1 capsule by mouth daily.     rosuvastatin  (CRESTOR ) 40 MG tablet TAKE 1 TABLET EVERY DAY (DISCONTINUE PRAVASTATIN ) 90 tablet 1   sodium bicarbonate 650 MG tablet Take 650 mg by mouth daily.     timolol  (TIMOPTIC ) 0.5 % ophthalmic solution Place 1 drop into both eyes every morning.     No current facility-administered medications for this visit.    REVIEW OF SYSTEMS:    10 Point review of Systems was done is negative except as noted  above.  PHYSICAL EXAMINATION: ECOG PERFORMANCE STATUS: 1 - Symptomatic but completely ambulatory  Vitals:   05/22/24 1044  BP: (!) 128/51  Pulse: (!) 59  Resp: 18  Temp: (!) 97.2 F (36.2 C)  SpO2: 99%   Filed Weights   05/22/24 1044  Weight: 160 lb 1.6 oz (72.6 kg)   Body mass index is 23.64 kg/m.  GENERAL: alert, in no acute distress and comfortable SKIN: no acute rashes, no significant lesions EYES: conjunctiva are pink and non-injected, sclera anicteric OROPHARYNX: MMM, no exudates, no oropharyngeal erythema or ulceration NECK: supple, no JVD LYMPH: no palpable lymphadenopathy in the cervical, axillary or inguinal regions LUNGS: clear to auscultation b/l with normal respiratory effort HEART: regular rate & rhythm, (+) Murmur ABDOMEN: normoactive bowel sounds, non tender, not distended, no hepatosplenomegaly Extremity: no pedal edema PSYCH: alert & oriented x 3 with fluent speech NEURO: no focal motor/sensory deficits  LABORATORY DATA:  I have reviewed the data as listed     Latest Ref Rng & Units 05/22/2024   11:28 AM 03/21/2024   12:00 AM 02/14/2024    2:29 PM  CBC  WBC 4.0 - 10.5 K/uL 4.6  5.0  4.5   Hemoglobin 13.0 - 17.0 g/dL 86.6  88.0  87.3   Hematocrit 39.0 - 52.0 % 41.4  38.1  40.1   Platelets 150 - 400 K/uL 194  209  187     PSA:  Lab Results  Component Value Date   PSA 14.4 (H) 06/29/2016   PSA1 15.7 (H) 05/22/2024         Latest Ref Rng & Units 05/22/2024   11:28 AM 02/14/2024    2:29 PM 09/22/2023   11:55 AM  CMP  Glucose 70 - 99 mg/dL 95  92  95   BUN 8 - 23 mg/dL 32  39  31   Creatinine 0.61 - 1.24 mg/dL 8.48  8.27  8.53   Sodium 135 - 145 mmol/L 140  142  141   Potassium 3.5 - 5.1 mmol/L 5.0  5.1  4.9   Chloride 98 - 111 mmol/L 109  109  107   CO2 22 - 32 mmol/L 27  25  28    Calcium  8.9 - 10.3 mg/dL 89.5  9.7  9.6   Total Protein 6.5 - 8.1 g/dL 8.0     Total Bilirubin 0.0 -  1.2 mg/dL 0.5     Alkaline Phos 38 - 126 U/L 74     AST  15 - 41 U/L 32     ALT 0 - 44 U/L 23        RADIOGRAPHIC STUDIES: I have personally reviewed the radiological images as listed and agreed with the findings in the report. CT ABDOMEN PELVIS WO CONTRAST Result Date: 04/05/2024 CLINICAL DATA:  Unintentional weight loss. LEFT upper quadrant mass. Fatigue. Black and blue formation of the LEFT lateral abdominal wall with no known injury. EXAM: CT ABDOMEN AND PELVIS WITHOUT CONTRAST TECHNIQUE: Multidetector CT imaging of the abdomen and pelvis was performed following the standard protocol without IV contrast. RADIATION DOSE REDUCTION: This exam was performed according to the departmental dose-optimization program which includes automated exposure control, adjustment of the mA and/or kV according to patient size and/or use of iterative reconstruction technique. COMPARISON:  11/11/2022 FINDINGS: Lower chest: Minimal bibasilar atelectasis. Mild cardiomegaly. Extensive calcifications of the aortic valve leaflets. Trace pericardial effusion. Hepatobiliary: No focal liver abnormality is seen. No gallstones, gallbladder wall thickening, or biliary dilatation. Pancreas: Unremarkable. No pancreatic ductal dilatation or surrounding inflammatory changes. Spleen: Normal in size without focal abnormality. Adrenals/Urinary Tract: Adrenal glands are unremarkable. Kidneys are normal, without renal calculi, focal lesion, or hydronephrosis. Bladder is unremarkable. Stomach/Bowel: No significant abnormality of the stomach. The duodenal does not completely cross the midline indicative of incomplete rotation of the bowel. Diffuse colonic diverticulosis without evidence of acute diverticulitis. Appendix is normal. Vascular/Lymphatic: Extensive calcified atheromatous plaque of the abdominal aorta and visualized branches. No enlarged abdominal or pelvic lymph nodes. Reproductive: Markedly enlarged prostate. Other: Stranding of the subcutaneous fat of the LEFT lateral abdomen consistent  with provided history of bruising. Musculoskeletal: Diffuse sclerosis of the L1 vertebral body is suspicious for metastatic involvement. IMPRESSION: 1. Diffuse sclerosis of the L1 vertebral body is suspicious for metastatic involvement. 2. Markedly enlarged prostate. 3. Diffuse colonic diverticulosis without evidence of acute diverticulitis. 4. Extensive calcified atheromatous plaque of the abdominal aorta and visualized branches. 5. Extensive calcifications of the aortic valve leaflets. Please correlate for any symptoms of aortic stenosis. 6. Stranding of the subcutaneous fat of the LEFT lateral abdomen consistent with provided history of bruising. 7. No acute osseous abnormality. Electronically Signed   By: Aliene Lloyd M.D.   On: 04/05/2024 11:40    ASSESSMENT & PLAN:   83 y.o. male with  Cancer, metastatic to bone (HCC) Diffuse sclerosis of the L1 vertebral body is suspicious for metastatic involvement on 04/05/2024 CTA. Does have hx of prior injury from 1995 at L4-L5 which causes some lower back pain on standing for extended periods. Only known FHX of cancer is lung cancer in his father, who was also a smoker.  Elevated PSA Lab Results  Component Value Date   PSA 14.4 (H) 06/29/2016   PSA (H) 01/02/2007    22.93 (NOTE) Result repeated and verified. Test Methodology: Hybritech PSA   PLAN:  Reviewed 04/05/2024 CTA with patient and daughter:  1. Diffuse sclerosis of the L1 vertebral body is suspicious for metastatic involvement.  2. Markedly enlarged prostate.  3. Diffuse colonic diverticulosis without evidence of acute diverticulitis.  4. Extensive calcified atheromatous plaque of the abdominal aorta and visualized branches.  5. Extensive calcifications of the aortic valve leaflets. Please correlate for any symptoms of aortic stenosis.  6. Stranding of the subcutaneous fat of the LEFT lateral abdomen consistent with provided history of bruising.  7. No acute osseous abnormality.  -  Ordering labs to evaluate to r/o injury related benign bone changes vs metastasized Prostate Cancer vs Multiple Myeloma. Results will determine what type of PET scan is required. Prostate bx may be required in the future. Orders Placed This Encounter  Procedures   CBC with Differential (Cancer Center Only)   CMP (Cancer Center only)   Multiple Myeloma Panel (SPEP&IFE w/QIG)   Kappa/lambda light chains   Sedimentation rate   PSA, total and free   Vitamin D  25 hydroxy   TSH   Beta 2 microglobulin, serum   - Will request chart/labs from Dr. Nieves, Urologist for review. - Reviewed 11/12/2022 Colonoscopy, initiated due to hematochezia.   FOLLOW-UP for PET scan in 1 week and office follow-up in 2-3 weeks with Dr. Onesimo.  The total time spent in the appointment was 60 minutes* . All of the patient's questions were answered and the patient knows to call the clinic with any problems, questions, or concerns.  Emaline Onesimo MD MS AAHIVMS [facility] Hematology/Oncology Physician [facility]  *Total Encounter Time as defined by the Centers for Medicare and Medicaid Services includes, in addition to the face-to-face time of a patient visit (documented in the note above) non-face-to-face time: obtaining and reviewing outside history, ordering and reviewing medications, tests or procedures, care coordination (communications with other health care professionals or caregivers) and documentation in the medical record.  I,Emily Lagle,acting as a neurosurgeon for Emaline Onesimo, MD.,have documented all relevant documentation on the behalf of Emaline Onesimo, MD,as directed by  Emaline Onesimo, MD while in the presence of Emaline Onesimo, MD.  I have reviewed the above documentation for accuracy and completeness, and I agree with the above.  Hula Tasso, MD

## 2024-05-23 ENCOUNTER — Telehealth: Payer: Self-pay | Admitting: Hematology

## 2024-05-23 LAB — KAPPA/LAMBDA LIGHT CHAINS
Kappa free light chain: 81.3 mg/L — ABNORMAL HIGH (ref 3.3–19.4)
Kappa, lambda light chain ratio: 1.54 (ref 0.26–1.65)
Lambda free light chains: 52.8 mg/L — ABNORMAL HIGH (ref 5.7–26.3)

## 2024-05-23 LAB — PSA, TOTAL AND FREE
PSA, Free Pct: 40.6 %
PSA, Free: 6.37 ng/mL
Prostate Specific Ag, Serum: 15.7 ng/mL — ABNORMAL HIGH (ref 0.0–4.0)

## 2024-05-23 NOTE — Telephone Encounter (Signed)
 Scheduled patient for next appointment. Called and spoke with the patient, he is aware.

## 2024-05-24 ENCOUNTER — Other Ambulatory Visit: Payer: Self-pay | Admitting: Cardiology

## 2024-05-24 DIAGNOSIS — E7800 Pure hypercholesterolemia, unspecified: Secondary | ICD-10-CM

## 2024-05-24 LAB — BETA 2 MICROGLOBULIN, SERUM: Beta-2 Microglobulin: 5.8 mg/L — ABNORMAL HIGH (ref 0.6–2.4)

## 2024-05-26 ENCOUNTER — Telehealth: Payer: Self-pay

## 2024-05-26 DIAGNOSIS — N1832 Chronic kidney disease, stage 3b: Secondary | ICD-10-CM

## 2024-05-26 LAB — MULTIPLE MYELOMA PANEL, SERUM
Albumin SerPl Elph-Mcnc: 3.5 g/dL (ref 2.9–4.4)
Albumin/Glob SerPl: 0.9 (ref 0.7–1.7)
Alpha 1: 0.3 g/dL (ref 0.0–0.4)
Alpha2 Glob SerPl Elph-Mcnc: 0.9 g/dL (ref 0.4–1.0)
B-Globulin SerPl Elph-Mcnc: 1.1 g/dL (ref 0.7–1.3)
Gamma Glob SerPl Elph-Mcnc: 1.7 g/dL (ref 0.4–1.8)
Globulin, Total: 4 g/dL — ABNORMAL HIGH (ref 2.2–3.9)
IgA: 140 mg/dL (ref 61–437)
IgG (Immunoglobin G), Serum: 1847 mg/dL — ABNORMAL HIGH (ref 603–1613)
IgM (Immunoglobulin M), Srm: 146 mg/dL — ABNORMAL HIGH (ref 15–143)
Total Protein ELP: 7.5 g/dL (ref 6.0–8.5)

## 2024-05-26 MED ORDER — DAPAGLIFLOZIN PROPANEDIOL 10 MG PO TABS: 10.0000 mg | ORAL_TABLET | Freq: Every day | ORAL | 1 refills | Status: DC

## 2024-05-26 NOTE — Telephone Encounter (Signed)
 Spoke with patient daughter was asking for [place] if we can send to medication refill because he does not have anymore since July and that she has tried to call the office on why we are not sending his medication to the right pharmacy. I did advise and apologize to the patient daughter. Rx has been sent to the pharmacy

## 2024-06-05 ENCOUNTER — Encounter (HOSPITAL_COMMUNITY)
Admission: RE | Admit: 2024-06-05 | Discharge: 2024-06-05 | Disposition: A | Discharge status: Home / Self Care | Source: Ambulatory Visit | Attending: Hematology | Admitting: Hematology

## 2024-06-05 DIAGNOSIS — R590 Localized enlarged lymph nodes: Secondary | ICD-10-CM | POA: Diagnosis not present

## 2024-06-05 DIAGNOSIS — C801 Malignant (primary) neoplasm, unspecified: Secondary | ICD-10-CM | POA: Diagnosis not present

## 2024-06-05 DIAGNOSIS — C7951 Secondary malignant neoplasm of bone: Secondary | ICD-10-CM | POA: Diagnosis not present

## 2024-06-05 DIAGNOSIS — R918 Other nonspecific abnormal finding of lung field: Secondary | ICD-10-CM | POA: Diagnosis not present

## 2024-06-05 DIAGNOSIS — J90 Pleural effusion, not elsewhere classified: Secondary | ICD-10-CM | POA: Diagnosis not present

## 2024-06-05 DIAGNOSIS — I719 Aortic aneurysm of unspecified site, without rupture: Secondary | ICD-10-CM | POA: Diagnosis not present

## 2024-06-05 DIAGNOSIS — N400 Enlarged prostate without lower urinary tract symptoms: Secondary | ICD-10-CM | POA: Diagnosis not present

## 2024-06-05 DIAGNOSIS — I2510 Atherosclerotic heart disease of native coronary artery without angina pectoris: Secondary | ICD-10-CM | POA: Diagnosis not present

## 2024-06-05 DIAGNOSIS — I700 Atherosclerosis of aorta: Secondary | ICD-10-CM | POA: Diagnosis not present

## 2024-06-05 LAB — GLUCOSE, CAPILLARY: Glucose-Capillary: 96 mg/dL (ref 70–99)

## 2024-06-05 MED ORDER — FLUDEOXYGLUCOSE F - 18 (FDG) INJECTION
7.8300 | Freq: Once | INTRAVENOUS | Status: AC
  Administered 2024-06-05: 7.83 via INTRAVENOUS

## 2024-06-26 ENCOUNTER — Encounter: Payer: Self-pay | Admitting: Cardiology

## 2024-06-26 ENCOUNTER — Ambulatory Visit: Attending: Cardiology | Admitting: Cardiology

## 2024-06-26 VITALS — BP 106/52 | HR 85 | Resp 16 | Ht 69.0 in | Wt 161.0 lb

## 2024-06-26 DIAGNOSIS — I10 Essential (primary) hypertension: Secondary | ICD-10-CM | POA: Diagnosis not present

## 2024-06-26 DIAGNOSIS — I779 Disorder of arteries and arterioles, unspecified: Secondary | ICD-10-CM

## 2024-06-26 DIAGNOSIS — N1832 Chronic kidney disease, stage 3b: Secondary | ICD-10-CM | POA: Diagnosis not present

## 2024-06-26 DIAGNOSIS — I351 Nonrheumatic aortic (valve) insufficiency: Secondary | ICD-10-CM

## 2024-06-26 DIAGNOSIS — I350 Nonrheumatic aortic (valve) stenosis: Secondary | ICD-10-CM | POA: Diagnosis not present

## 2024-06-26 MED ORDER — CLOPIDOGREL BISULFATE 75 MG PO TABS: 75.0000 mg | ORAL_TABLET | Freq: Every day | ORAL | 3 refills | Status: DC

## 2024-06-26 NOTE — Patient Instructions (Signed)
 Medication Instructions:  Your physician recommends that you continue on your current medications as directed. Please refer to the Current Medication list given to you today.  *If you need a refill on your cardiac medications before your next appointment, please call your pharmacy*  Lab Work: NONE ordered at this time of appointment   Testing/Procedures: Your physician has requested that you have an echocardiogram in 6 months. Echocardiography is a painless test that uses sound waves to create images of your heart. It provides your doctor with information about the size and shape of your heart and how well your heart's chambers and valves are working. This procedure takes approximately one hour. There are no restrictions for this procedure. Please do NOT wear cologne, perfume, aftershave, or lotions (deodorant is allowed). Please arrive 15 minutes prior to your appointment time.  Please note: We ask at that you not bring children with you during ultrasound (echo/ vascular) testing. Due to room size and safety concerns, children are not allowed in the ultrasound rooms during exams. Our front office staff cannot provide observation of children in our lobby area while testing is being conducted. An adult accompanying a patient to their appointment will only be allowed in the ultrasound room at the discretion of the ultrasound technician under special circumstances. We apologize for any inconvenience.   Your physician has requested that you have an ankle brachial index (ABI) 6 months . During this test an ultrasound and blood pressure cuff are used to evaluate the arteries that supply the arms and legs with blood. Allow thirty minutes for this exam. There are no restrictions or special instructions.  Please note: We ask at that you not bring children with you during ultrasound (echo/ vascular) testing. Due to room size and safety concerns, children are not allowed in the ultrasound rooms during exams.  Our front office staff cannot provide observation of children in our lobby area while testing is being conducted. An adult accompanying a patient to their appointment will only be allowed in the ultrasound room at the discretion of the ultrasound technician under special circumstances. We apologize for any inconvenience.   Follow-Up: At [facility], you and your health needs are our priority.  As part of our continuing mission to provide you with exceptional heart care, our providers are all part of one team.  This team includes your primary Cardiologist (physician) and Advanced Practice Providers or APPs (Physician Assistants and Nurse Practitioners) who all work together to provide you with the care you need, when you need it.  Your next appointment:   6 month(s) apt must be after procedures.   Provider:   Gordy Bergamo, MD    We recommend signing up for the patient portal called MyChart.  Sign up information is provided on this After Visit Summary.  MyChart is used to connect with patients for Virtual Visits (Telemedicine).  Patients are able to view lab/test results, encounter notes, upcoming appointments, etc.  Non-urgent messages can be sent to your provider as well.   To learn more about what you can do with MyChart, go to [email or web address].

## 2024-06-26 NOTE — Progress Notes (Signed)
 Cardiology Office Note:  .   Date:  06/26/2024  ID:  [name], DOB 07/15/1941, MRN [id number] PCP: Sherlynn Madden, MD  [facility] Providers Cardiologist:  Gordy Bergamo, MD   History of Present Illness: .   [name] is a 83 y.o. 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 He also has aortic stenosis and aortic regurgitation.    Patient had hospitalization from 4/9 through 11/14/2022 with acute GI bleed from bleeding colonic angiodysplasia, he also had colonic polyps and external hemorrhoids name blood transfusion.  Echocardiogram performed 11/09/2022 elevated severe aortic stenosis.  He is accompanied by his God daughter Annabella.  He has aortic stenosis although felt to be moderate to severe, had remained stable over the past 2 years and as patient was asymptomatic, recommended continued medical follow-up and repeat echocardiogram in a year. No change in symptoms.     Discussed the use of AI scribe software for clinical note transcription with the patient, who gave verbal consent to proceed.  History of Present Illness [name] is an 83 year old male with aortic stenosis and peripheral arterial disease who presents for follow-up of his cardiovascular conditions.  He is monitored for aortic stenosis and aortic regurgitation. He denies dizziness, syncope, chest discomfort, or chest pain. Breathing is stable.  Hypertension is well-controlled with a blood pressure of 106/52. Chronic kidney disease is stable at stage 3A-B.  Peripheral arterial disease with blocked leg arteries is stable. He experiences leg weakness due to peripheral arterial disease and spinal stenosis. His legs tire  easily, and he has back issues at L4, L5, and degenerative disc disease.  A history of gastrointestinal bleeding in 2024 led to stopping aspirin and other medications. Hemoglobin levels have improved. He is not on aspirin.  He is retired, previously on disability for back issues. He uses handrails at home and bikes three times a week for 30 minutes to maintain leg strength. He has noticed a decline in leg strength.  Cardiac Studies relevent.    ECHOCARDIOGRAM COMPLETE 02/03/2024  1. Left ventricular ejection fraction, by estimation, is 50 to 55%. The left ventricle has low normal function. The left ventricle has no regional wall motion abnormalities. There is mild concentric left ventricular hypertrophy. Left ventricular diastolic parameters are consistent with Grade I diastolic dysfunction (impaired relaxation). The average left ventricular global longitudinal strain is -14.3 %. The global longitudinal strain is abnormal. 2. Right ventricular systolic function is normal. The right ventricular size is normal. 3. The mitral valve is normal in structure. Trivial mitral valve regurgitation. No evidence of mitral stenosis. 4. The aortic valve has an indeterminant number of cusps. There is severe calcifcation of the aortic valve. There is moderate thickening of the aortic valve. Aortic valve regurgitation is moderate. Severe aortic valve stenosis. Aortic valve area, by VTI measures 0.93 cm. Aortic valve mean gradient measures 44.0 mmHg. Aortic valve Vmax measures 4.36 m/s. 5. Aortic dilatation noted. There is dilatation of the ascending aorta, measuring 43 mm. 6.  Compared to 04/12/2023, there is progression of aortic valve stenosis with mean gradient increasing from 28 to 44 mmHg with valve area decreasing from 1 cm compared to the present 0.93 cm.  Labs   Lab Results  Component Value Date  CHOL 116 12/23/2022   HDL 55 12/23/2022   LDLCALC 46 12/23/2022   TRIG 70 12/23/2022   CHOLHDL 2.1  12/23/2022   No results found for: LIPOA  Recent Labs    09/22/23 1155 02/14/24 1429 05/22/24 1128  NA 141 142 140  K 4.9 5.1 5.0  CL 107 109 109  CO2 28 25 27   GLUCOSE 95 92 95  BUN 31* 39* 32*  CREATININE 1.46* 1.72* 1.51*  CALCIUM  9.6 9.7 10.4*  GFRNONAA  --   --  46*    Lab Results  Component Value Date   ALT 23 05/22/2024   AST 32 05/22/2024   ALKPHOS 74 05/22/2024   BILITOT 0.5 05/22/2024      Latest Ref Rng & Units 05/22/2024   11:28 AM 03/21/2024   12:00 AM 02/14/2024    2:29 PM  CBC  WBC 4.0 - 10.5 K/uL 4.6  5.0  4.5   Hemoglobin 13.0 - 17.0 g/dL 86.6  88.0  87.3   Hematocrit 39.0 - 52.0 % 41.4  38.1  40.1   Platelets 150 - 400 K/uL 194  209  187    Lab Results  Component Value Date   HGBA1C 11.6 07/14/2022    Lab Results  Component Value Date   TSH 2.130 05/22/2024    ROS  Review of Systems  Cardiovascular:  Positive for claudication. Negative for chest pain, dyspnea on exertion and leg swelling.  Musculoskeletal:  Positive for back pain.   Physical Exam:   VS:  BP (!) 106/52 (BP Location: Left Arm, Patient Position: Sitting, Cuff Size: Normal)   Pulse 85   Resp 16   Ht 5' 9 (1.753 m)   Wt 161 lb (73 kg)   SpO2 93%   BMI 23.78 kg/m    Wt Readings from Last 3 Encounters:  06/26/24 161 lb (73 kg)  05/22/24 160 lb 1.6 oz (72.6 kg)  05/16/24 162 lb 12.8 oz (73.8 kg)    BP Readings from Last 3 Encounters:  06/26/24 (!) 106/52  05/22/24 (!) 128/51  05/16/24 126/78   Physical Exam Neck:     Vascular: Carotid bruit (bilateral) present. No JVD.  Cardiovascular:     Rate and Rhythm: Normal rate and regular rhythm.     Pulses:          Dorsalis pedis pulses are 0 on the right side.       Posterior tibial pulses are 0 on the right side and 0 on the left side.     Heart sounds: S1 normal and S2 normal. Murmur heard.     Holosystolic murmur is present with a grade of 3/6 at the upper right sternal border.     Blowing early diastolic murmur  is present with a grade of 2/4 at the upper left sternal border and apex.     No gallop.     Comments: CRT < 2-3 Sec Pulmonary:     Effort: Pulmonary effort is normal.     Breath sounds: Normal breath sounds.  Abdominal:     General: Bowel sounds are normal.     Palpations: Abdomen is soft.  Musculoskeletal:     Right lower leg: No edema.     Left lower leg: No edema.    EKG:    EKG Interpretation Date/Time:  Monday June 26 2024 10:57:51 EST Ventricular Rate:  82 PR Interval:  176 QRS Duration:  140 QT Interval:  434 QTC Calculation: 507 R Axis:  96  Text Interpretation: EKG 06/26/2024: Ectopic atrial rhythm at a rate of 82 bpm, normal axis, right bundle branch block.  LVH with repolarization abnormality.  Frequent PACs.  Compared to 11/16/2022, no significant change. Confirmed by Cheynne Virden, Jagadeesh ([id number]) on 06/26/2024 11:05:39 AM    ASSESSMENT AND PLAN: .      ICD-10-CM   1. Severe aortic stenosis  I35.0 EKG 12-Lead    ECHOCARDIOGRAM COMPLETE    2. Moderate aortic regurgitation  I35.1     3. Primary hypertension  I10     4. Stage 3b chronic kidney disease (HCC)  N18.32     5. PAOD (peripheral arterial occlusive disease)  I77.9 clopidogrel  (PLAVIX ) 75 MG tablet    VAS US  ABI WITH/WO TBI     Assessment & Plan Aortic valve stenosis and regurgitation Slight progression in aortic valve stenosis with narrowing of the valve opening and moderate regurgitation. No current symptoms of dyspnea or chest pain. Monitoring for progression and potential need for intervention if symptoms develop or gradients increase. - Scheduled follow-up in 6 months. - Ordered echocardiogram prior to next visit. - Monitor for symptoms of dyspnea or decreased exercise tolerance.  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 Peripheral neuropathy likely secondary to peripheral arterial disease. No recent bleeding  episodes. Avoid invasive procedures due to risk of adverse reactions and complications. - Ordered Doppler ultrasound of lower extremities in 6 months. - Continue current management and monitor for symptoms of claudication or neuropathy.  Chronic kidney disease, stage 3b Chronic kidney disease stage 3b, well-managed over the years.  Essential hypertension Well-controlled with current medication regimen. Recent blood pressure reading was 106/52 mmHg.  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 No recent exacerbations reported.  History of gastrointestinal bleeding, 2024 Gastrointestinal bleeding in 2024. No recent episodes. Hemoglobin levels have stabilized. - Initiated Plavix  therapy to prevent stroke and manage peripheral arterial disease. - Instructed to monitor for signs of gastrointestinal bleeding, such as dark or bloody stools.  History of cerebrovascular accident (stroke) History of stroke. Plavix  therapy initiated to prevent recurrence. - Continue Plavix  therapy.   Follow up: 6 months Signed,  Gordy Bergamo, MD, [facility] 06/26/2024, 11:24 AM [facility] [address] [place], [place] [address] Phone: [phone]. Fax:  [phone]

## 2024-06-27 ENCOUNTER — Inpatient Hospital Stay: Attending: Hematology | Admitting: Hematology

## 2024-06-27 VITALS — BP 142/48 | HR 70 | Temp 98.1°F | Resp 18 | Wt 161.1 lb

## 2024-06-27 DIAGNOSIS — R590 Localized enlarged lymph nodes: Secondary | ICD-10-CM | POA: Diagnosis not present

## 2024-06-27 DIAGNOSIS — R972 Elevated prostate specific antigen [PSA]: Secondary | ICD-10-CM

## 2024-06-27 DIAGNOSIS — K3189 Other diseases of stomach and duodenum: Secondary | ICD-10-CM | POA: Diagnosis not present

## 2024-06-27 DIAGNOSIS — C7951 Secondary malignant neoplasm of bone: Secondary | ICD-10-CM | POA: Diagnosis not present

## 2024-06-27 DIAGNOSIS — C801 Malignant (primary) neoplasm, unspecified: Secondary | ICD-10-CM | POA: Diagnosis not present

## 2024-06-27 DIAGNOSIS — Z809 Family history of malignant neoplasm, unspecified: Secondary | ICD-10-CM | POA: Diagnosis not present

## 2024-06-27 DIAGNOSIS — Z87891 Personal history of nicotine dependence: Secondary | ICD-10-CM | POA: Diagnosis not present

## 2024-06-27 NOTE — Progress Notes (Signed)
 HEMATOLOGY ONCOLOGY PROGRESS NOTE  Date of service: 06/27/2024  Patient Care Team: Sherlynn Madden, MD as PCP - General (Internal Medicine) Ladona Heinz, MD as PCP - Cardiology (Cardiology) Ladona Heinz, MD as Consulting Physician (Cardiology) Octavia Charleston, MD as Consulting Physician (Ophthalmology) Gerlean Lynwood BIRCH, MD (Inactive) as Consulting Physician (Vascular Surgery) Rosemarie Eather RAMAN, MD as Consulting Physician (Neurology) Alvia Norleen BIRCH, MD as Consulting Physician (Ophthalmology)  CHIEF COMPLAINT/PURPOSE OF CONSULTATION: Follow-up for continued evaluation and management of 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  HISTORY OF PRESENTING ILLNESS:  [name] is a wonderful 83 y.o. male who has been referred to us  by Dr. 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 is ambulating with a wheelchair, accompanied by daughter.   He received a CTA 03/24/2024 for an abdominal mass for which he was evaluated by his PCP, Sherlynn Madden, MD on 8/19. It's noted that what prompted the office visit initially is a noticeable spontaneous bruise on his left abdomen.  Denies abdominal pain, new black/bloody stools, change in bowel/urinary habits (Bowel movements 2-3x/ day), pain after eating, or unusual weight loss. Notably his weight does fluctuate, but he does eat well.  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 Bleeding treated with fulguration by argon plasma.  He reports that he is followed by Dr. Nieves, Urologist, annually for prostatomegaly and elevated PSA - in [facility] his last PSA was 14.4 in 2017. Denies ever having a prostate biopsy.   Regarding the lesion found at L1, he mentions that in 1995 he injured his back [place] when he was struck in  the spine in the lower back. He says that he does experience lower back pain if he stands for an extensive period. Denies change in breathing, new cough, other lumps/bumps, or other areas of discomfort. Does endorse weakness in his legs, which he uses a stationary bike for strengthening exercise.   Fhx of Lung Cancer in father with risk factor of being a smoker.      SUMMARY OF ONCOLOGIC HISTORY: Oncology History   No history exists.    INTERVAL HISTORY:  [name] is a 83 y.o. 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 He is accompanied by his daughter and ambulating with a wheel chair today.  he was last seen by me on 05/22/2024; at the time he mentioned experiencing a spontaneous bruise on the left side of his abdomen. He reports weakness in his legs.  Today, he denies any new symptoms since the last visit. He has been eating well and his weight has remained stable.  He reports some back pain due to a work related previous injury to lower back.  He denies any abdominal pain, bleeding issues (nose bleeds, gum bleeds, abnormal/spontaneous bruising), diarrhea, SOB, chest pain, or bowel/urinary changes,  REVIEW OF SYSTEMS:   10 Point review of systems of done and is negative except as noted above.  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) 12/11/2018   Hyperlipidemia    Hypertension    Hypertensive kidney disease, benign    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)    Pure hypercholesterolemia    Unspecified vitamin D  deficiency      SURGICAL HISTORY Past Surgical History:  Procedure Laterality Date   CATARACT EXTRACTION     COLONOSCOPY WITH PROPOFOL  N/A 11/12/2022   Procedure: COLONOSCOPY WITH PROPOFOL ;  Surgeon: Leigh Elspeth SQUIBB, MD;  Location: [facility] ENDOSCOPY;  Service: Gastroenterology;  Laterality: N/A;   EYE SURGERY     left eye,    cornea repair   HOT HEMOSTASIS N/A 11/12/2022   Procedure: HOT HEMOSTASIS (ARGON PLASMA COAGULATION/BICAP);  Surgeon: Leigh Elspeth SQUIBB, MD;  Location: [facility] ENDOSCOPY;  Service: Gastroenterology;  Laterality: N/A;   PERIPHERAL VASCULAR CATHETERIZATION N/A 07/28/2016   Procedure: Lower Extremity Angiography;  Surgeon: Gordy Bergamo, MD;  Location: [facility] INVASIVE CV LAB;  Service: Cardiovascular;  Laterality: N/A;    SOCIAL HISTORY Social History   Tobacco Use   Smoking status: Former    Current packs/day: 0.00    Average packs/day: 0.5 packs/day for 13.0 years (6.5 ttl pk-yrs)    Types: Cigarettes    Start date: 03/08/1958    Quit date: 03/09/1971    Years since quitting: 53.3   Smokeless tobacco: Never  Vaping Use   Vaping status: Never Used  Substance Use Topics   Alcohol use: No    Alcohol/week: 0.0 standard drinks of alcohol   Drug use: Never    Social History   Social History Narrative   Not on file    SOCIAL DRIVERS OF HEALTH SDOH Screenings   Food Insecurity: No Food Insecurity (05/22/2024)  Housing: Unknown (05/22/2024)  Transportation Needs: No Transportation Needs (05/22/2024)  Utilities: Not At Risk (05/22/2024)  Alcohol Screen: Low Risk  (07/21/2018)  Depression (PHQ2-9): Low Risk  (05/22/2024)  Financial Resource Strain: Low Risk  (04/18/2018)  Physical Activity: Insufficiently Active (04/18/2018)  Social Connections: Socially Isolated (11/23/2023)  Stress: No Stress Concern Present (04/18/2018)  Tobacco Use: Medium Risk (06/26/2024)     FAMILY HISTORY Family History  Problem Relation Age of Onset   Cancer Father      ALLERGIES: is allergic to  penicillins.  MEDICATIONS  Current Outpatient Medications  Medication Sig Dispense Refill   amLODipine  (NORVASC ) 5 MG tablet Take 1.5 tablets (7.5 mg total) by mouth daily. Daughter states that nephrology recommended him to take 7.5 mg daily     clobetasol  (TEMOVATE ) 0.05 % GEL Apply 1 Application topically as needed.     clopidogrel  (PLAVIX ) 75 MG tablet Take 1 tablet (75 mg total) by mouth daily. 90 tablet 3   dapagliflozin  propanediol (FARXIGA ) 10 MG TABS tablet Take 1 tablet (10 mg total) by mouth daily before breakfast. 90 tablet 1   ezetimibe  (ZETIA ) 10 MG tablet TAKE 1 TABLET EVERY DAY 90 tablet 3   fluticasone  (FLONASE ) 50 MCG/ACT nasal spray PLACE 2 SPRAYS INTO BOTH NOSTRILS DAILY. (Patient taking differently: Place 2 sprays into both nostrils as needed.) 48 g 3   furosemide  (LASIX ) 20 MG tablet TAKE 2 TABLETS (40 MG TOTAL) BY MOUTH EVERY MONDAY, WEDNESDAY, AND FRIDAY. 72 tablet 3   latanoprost  (XALATAN ) 0.005 % ophthalmic solution Place 1 drop into both eyes at bedtime.      loratadine  (CLARITIN ) 10 MG tablet Take 1 tablet (10 mg total) by mouth daily. (Patient taking  differently: Take 10 mg by mouth as needed for allergies.) 30 tablet 11   losartan  (COZAAR ) 50 MG tablet Take 50 mg by mouth daily.     Multiple Vitamins-Minerals (OCUVITE ADULT 50+) CAPS Take 1 capsule by mouth daily.     rosuvastatin  (CRESTOR ) 40 MG tablet TAKE 1 TABLET EVERY DAY (DISCONTINUE PRAVASTATIN ) 90 tablet 3   sodium bicarbonate 650 MG tablet Take 650 mg by mouth daily.     timolol  (TIMOPTIC ) 0.5 % ophthalmic solution Place 1 drop into both eyes every morning.     No current facility-administered medications for this visit.    PHYSICAL EXAMINATION: ECOG PERFORMANCE STATUS: 1 - Symptomatic but completely ambulatory VITALS: Vitals:   06/27/24 1223 06/27/24 1231  BP: (!) 156/58 (!) 142/48  Pulse: 70   Resp: 18   Temp: 98.1 F (36.7 C)   SpO2: 99%    Filed Weights   06/27/24 1223  Weight: 161 lb 1.6  oz (73.1 kg)   Body mass index is 23.79 kg/m.  GENERAL: alert, in no acute distress and comfortable SKIN: no acute rashes, no significant lesions EYES: conjunctiva are pink and non-injected, sclera anicteric OROPHARYNX: MMM, no exudates, no oropharyngeal erythema or ulceration NECK: supple, no JVD LYMPH:  no palpable lymphadenopathy in the cervical, axillary or inguinal regions LUNGS: clear to auscultation b/l with normal respiratory effort HEART: regular rate & rhythm ABDOMEN:  normoactive bowel sounds , non tender, not distended, no hepatosplenomegaly Extremity: no pedal edema PSYCH: alert & oriented x 3 with fluent speech NEURO: no focal motor/sensory deficits  LABORATORY DATA:   I have reviewed the data as listed     Latest Ref Rng & Units 05/22/2024   11:28 AM 03/21/2024   12:00 AM 02/14/2024    2:29 PM  CBC EXTENDED  WBC 4.0 - 10.5 K/uL 4.6  5.0  4.5   RBC 4.22 - 5.81 MIL/uL 4.68  4.21  4.38   Hemoglobin 13.0 - 17.0 g/dL 86.6  88.0  87.3   HCT 39.0 - 52.0 % 41.4  38.1  40.1   Platelets 150 - 400 K/uL 194  209  187   NEUT# 1.7 - 7.7 K/uL 2.6  2,720  2,669   Lymph# 0.7 - 4.0 K/uL 1.0          Latest Ref Rng & Units 05/22/2024   11:28 AM 02/14/2024    2:29 PM 09/22/2023   11:55 AM  CMP  Glucose 70 - 99 mg/dL 95  92  95   BUN 8 - 23 mg/dL 32  39  31   Creatinine 0.61 - 1.24 mg/dL 8.48  8.27  8.53   Sodium 135 - 145 mmol/L 140  142  141   Potassium 3.5 - 5.1 mmol/L 5.0  5.1  4.9   Chloride 98 - 111 mmol/L 109  109  107   CO2 22 - 32 mmol/L 27  25  28    Calcium  8.9 - 10.3 mg/dL 89.5  9.7  9.6   Total Protein 6.5 - 8.1 g/dL 8.0     Total Bilirubin 0.0 - 1.2 mg/dL 0.5     Alkaline Phos 38 - 126 U/L 74     AST 15 - 41 U/L 32     ALT 0 - 44 U/L 23        RADIOGRAPHIC STUDIES: I have personally reviewed the radiological images as listed and agreed with the findings in the report. NM PET Image Initial (PI) Skull Base To Thigh  Result Date: 06/06/2024 CLINICAL DATA:   Initial treatment strategy for osseous metastasis of unknown primary EXAM: NUCLEAR MEDICINE PET SKULL BASE TO THIGH TECHNIQUE: 7.8 mCi F-18 FDG was injected intravenously. Full-ring PET imaging was performed from the skull base to thigh after the radiotracer. CT data was obtained and used for attenuation correction and anatomic localization. Fasting blood glucose: 96 mg/dl COMPARISON:  CT abdomen pelvis November 11, 2022. FINDINGS: Mediastinal blood pool activity: SUV max 2.3 Liver activity: SUV max 2.8 NECK: Misregistration between PET and CT due to patient's head movement. Right supraclavicular hypermetabolic lymph node measuring 1 cm with max SUV 3.2. A few additional supraclavicular sub 5 mm lymph nodes on the left below PET resolution. CHEST: Diffuse mildly FDG avid ground-glass attenuation and interlobular septal thickening of right lower lobe. The heart size is normal. Trace right pleural effusion. Atherosclerotic calcifications of aorta and coronary arteries. 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 Trace pericardial fluid. Atherosclerotic calcifications of the aorta coronary arteries and aortic annulus. ABDOMEN/PELVIS: Diffuse metabolic activity of the gastric body and antrum with max SUV up to 6 with mild wall thickening without focal lesion. Heterogeneous enlarged prostate with diffuse and heterogeneous foci of metabolic activity max SUV up to 15. Diffuse colonic diverticulosis without diverticulitis. No bowel obstruction. Prostatomegaly. Thick-walled bladder. Small fat containing left inguinal hernia. SKELETON: Degenerative changes of the spine L1 sclerotic changes without significant FDG uptake. Bilateral greater trochanteric bursitis. IMPRESSION: Diffuse wall thickening and FDG uptake of the gastric body and fundus concerning for gastric malignancy (including lymphoma, adenocarcinoma) or suggestive of gastritis. Recommend endoscopy and biopsy for further assessment.  Hypermetabolic right supraclavicular lymph node concerning for metastatic nodal disease [employer] node). Heterogeneous enlarged prostate with multiple foci of FDG uptake. Primary prostate malignancy can not be excluded. Correlate with PSA levels and dedicated MRI prostate imaging. Diffuse pulmonary ground-glass attenuation with interlobular septal thickening with FDG uptake involving the right lower lobe with associated trace right pleural effusion may represent infectious/inflammatory process, versus pulmonary edema in the setting of congestive heart failure. Indeterminate sclerotic lesion of L1 vertebral body without significant FDG uptake above background. Recommend MRI lumbar spine for further assessment. Electronically Signed   By: Megan  Zare M.D.   On: 06/06/2024 19:50    ASSESSMENT & PLAN:  83 y.o. male with  1. L1 lesion -- not FDG avid on PET Diffuse sclerosis of the L1 vertebral body is suspicious for metastatic involvement on 04/05/2024 CTA. Does have hx of prior injury from 1995 at L4-L5 which causes some lower back pain on standing for extended periods. Only known FHX of cancer is lung cancer in his father, who was also a smoker.   2. Elevated PSA pet shows heterogeneously enlarged prostate with  multiple foci of FDG uptake -- concerning for prostate cancer  3. DIfuse gastric thickening and hypermetabolic rt supraclavicular LN - concerning for possible gastric malignancy   PLAN: - Discussed lab results on 06/27/2024 in detail with patient: -Hemoglobin normal, WBC, Platelets normal -Blood chemistry showed mild CKD -no evidence of monoclonal paraproteinemia on Myeloma panel -PSA level was somewhat elevated, at 15.7 -Vitamin D  deficient, replacing Vitamin D  with an OTC supplement -Calcium  levels are high, no need to take calcium     -recommended staying hydrated   Discussed PET Scan Findings in details L1 Spot is not relighting up on the PET Scan, some degenerative changes No  obvious findings of other tumors of the bone An enlarged prostate that  is lighting up, may be localized prostate cancer with no obvious lymph nodes around the area. The bladder looks thickened, due to increased pressure caused by enlarged prostate Some thickening of the lining of the stomach, unsure if this is due to inflammation, an ulcer, or a tumor A small lymph node discovered above the right collar bone, this can be evaluated, not as obvious upon physical examination  -Recommends seeing Dr. Nieves for elevated PSA levels and FDG avid lesions in prostate on PET/CT -Recommended an endoscopy for thickening of stomach lining, as soon as possible  -Gastroenterologist is Dr. Leigh, will send in referral to him for out patient procedure -Referral to for US  guided rt supraclavicular LN biopsy   FOLLOW-UP US  guided core needle biopsy of rt supraclvaicular FGD avid LN GI referral to Dr Leigh for EGD for evaluation of diffusely thickened fdg avid gastric mucosa concerning for possible gastric malignancy. RTC with Dr Onesimo in 3 weeks  The total time spent in the appointment was 41 minutes* .  All of the patient's questions were answered and the patient knows to call the clinic with any problems, questions, or concerns.  Emaline Onesimo MD MS AAHIVMS [facility] Hematology/Oncology Physician [facility]  *Total Encounter Time as defined by the Centers for Medicare and Medicaid Services includes, in addition to the face-to-face time of a patient visit (documented in the note above) non-face-to-face time: obtaining and reviewing outside history, ordering and reviewing medications, tests or procedures, care coordination (communications with other health care professionals or caregivers) and documentation in the medical record.  I, Alan Blowers, acting as a neurosurgeon for Emaline Onesimo, MD.,have documented all relevant documentation on the behalf of Emaline Onesimo, MD,as directed by  Emaline Onesimo, MD while in the presence of Emaline Onesimo, MD.  I have reviewed the above documentation for accuracy and completeness, and I agree with the above.  Nakeisha Greenhouse, MD

## 2024-07-03 DIAGNOSIS — H401112 Primary open-angle glaucoma, right eye, moderate stage: Secondary | ICD-10-CM | POA: Diagnosis not present

## 2024-07-03 DIAGNOSIS — H401123 Primary open-angle glaucoma, left eye, severe stage: Secondary | ICD-10-CM | POA: Diagnosis not present

## 2024-07-05 NOTE — Progress Notes (Incomplete)
 HEMATOLOGY ONCOLOGY PROGRESS NOTE  Date of service: 06/27/2024  Patient Care Team: Sherlynn Madden, MD as PCP - General (Internal Medicine) Ladona Heinz, MD as PCP - Cardiology (Cardiology) Ladona Heinz, MD as Consulting Physician (Cardiology) Octavia Charleston, MD as Consulting Physician (Ophthalmology) Gerlean Lynwood BIRCH, MD (Inactive) as Consulting Physician (Vascular Surgery) Rosemarie Eather RAMAN, MD as Consulting Physician (Neurology) Alvia Norleen BIRCH, MD as Consulting Physician (Ophthalmology)  CHIEF COMPLAINT/PURPOSE OF CONSULTATION: Follow-up for continued evaluation and management of 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  HISTORY OF PRESENTING ILLNESS: [name] is a wonderful 83 y.o. male who has been referred to us  by Dr. 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 is ambulating with a wheelchair, accompanied by daughter.   He received a CTA 03/24/2024 for an abdominal mass for which he was evaluated by his PCP, Sherlynn Madden, MD on 8/19. It's noted that what prompted the office visit initially is a noticeable spontaneous bruise on his left abdomen.  Denies abdominal pain, new black/bloody stools, change in bowel/urinary habits (Bowel movements 2-3x/ day), pain after eating, or unusual weight loss. Notably his weight does fluctuate, but he does eat well.  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 Bleeding treated with fulguration by argon plasma.  He reports that he is followed by Dr. Nieves, Urologist, annually for prostatomegaly and elevated PSA - in [facility] his last PSA was 14.4 in 2017. Denies ever having a prostate biopsy.   Regarding the lesion found at L1, he mentions that in 1995 he injured his back while [place] when he was struck in  the spine at L4-5. He says that he does experience lower back pain if he stands for an extensive period. Denies change in breathing, new cough, other lumps/bumps, or other areas of discomfort. Does endorse weakness in his legs, which he uses a stationary bike for strengthening exercise.   Fhx of Lung Cancer in father with risk factor of being a smoker.      SUMMARY OF ONCOLOGIC HISTORY: Oncology History   No history exists.    INTERVAL HISTORY: [name] is a 83 y.o. 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 He is accompanied by his daughter and ambulating with a wheel chair today.  he was last seen by me on 05/22/2024; at the time he mentioned experiencing a spontaneous bruise on the left side of his abdomen. He reports weakness in his legs.  Today, he denies any new symptoms since the last visit. He has been eating well and his weight has remained stable.  He reports some back pain due to a work related previous injury to lower back.  He denies any abdominal pain, bleeding issues (nose bleeds, gum bleeds, abnormal/spontaneous bruising), diarrhea, SOB, chest pain, or bowel/urinary changes,  REVIEW OF SYSTEMS:   10 Point review of systems of done and is negative except as noted above.  MEDICAL HISTORY Past Medical History:  Diagnosis Date  . Anemia   . Cataract    traumatic  . Chronic diastolic heart failure (HCC)   . Chronic kidney disease, stage III (moderate) (HCC)   . Depressive disorder, not elsewhere classified   . Elevated prostate specific antigen (PSA)   . 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 Hyperlipidemia   . Hypertension   . Hypertensive kidney disease, benign   . 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 Pure hypercholesterolemia   . Unspecified vitamin D  deficiency      SURGICAL HISTORY Past Surgical History:  Procedure Laterality Date  . CATARACT EXTRACTION    . COLONOSCOPY WITH PROPOFOL  N/A 11/12/2022   Procedure: COLONOSCOPY WITH PROPOFOL ;  Surgeon: Leigh Elspeth SQUIBB, MD;  Location: [facility] ENDOSCOPY;  Service: Gastroenterology;  Laterality: N/A;  . EYE SURGERY     left eye,    cornea repair  . HOT HEMOSTASIS N/A 11/12/2022   Procedure: HOT HEMOSTASIS (ARGON PLASMA COAGULATION/BICAP);  Surgeon: Leigh Elspeth SQUIBB, MD;  Location: [facility] ENDOSCOPY;  Service: Gastroenterology;  Laterality: N/A;  . PERIPHERAL VASCULAR CATHETERIZATION N/A 07/28/2016   Procedure: Lower Extremity Angiography;  Surgeon: Gordy Bergamo, MD;  Location: [facility] INVASIVE CV LAB;  Service: Cardiovascular;  Laterality: N/A;    SOCIAL HISTORY Social History   Tobacco Use  . Smoking status: Former    Current packs/day: 0.00    Average packs/day: 0.5 packs/day for 13.0 years (6.5 ttl pk-yrs)    Types: Cigarettes    Start date: 03/08/1958    Quit date: 03/09/1971    Years since quitting: 53.3  . Smokeless tobacco: Never  Vaping Use  . Vaping status: Never Used  Substance Use Topics  . Alcohol use: No    Alcohol/week: 0.0 standard drinks of alcohol  . Drug use: Never    Social History   Social History Narrative  . Not on file    SOCIAL DRIVERS OF HEALTH SDOH Screenings   Food Insecurity: No Food Insecurity (05/22/2024)  Housing: Unknown (05/22/2024)  Transportation Needs: No Transportation Needs (05/22/2024)  Utilities: Not At Risk (05/22/2024)  Alcohol Screen: Low Risk  (07/21/2018)  Depression (PHQ2-9): Low Risk  (05/22/2024)  Financial Resource Strain: Low Risk  (04/18/2018)  Physical Activity: Insufficiently Active (04/18/2018)  Social Connections: Socially Isolated (11/23/2023)  Stress: No Stress Concern Present (04/18/2018)  Tobacco Use: Medium Risk (06/26/2024)     FAMILY HISTORY Family History  Problem Relation Age of Onset  . Cancer Father      ALLERGIES: is  allergic to penicillins.  MEDICATIONS  Current Outpatient Medications  Medication Sig Dispense Refill  . amLODipine  (NORVASC ) 5 MG tablet Take 1.5 tablets (7.5 mg total) by mouth daily. Daughter states that nephrology recommended him to take 7.5 mg daily    . clobetasol  (TEMOVATE ) 0.05 % GEL Apply 1 Application topically as needed.    . clopidogrel  (PLAVIX ) 75 MG tablet Take 1 tablet (75 mg total) by mouth daily. 90 tablet 3  . dapagliflozin  propanediol (FARXIGA ) 10 MG TABS tablet Take 1 tablet (10 mg total) by mouth daily before breakfast. 90 tablet 1  . ezetimibe  (ZETIA ) 10 MG tablet TAKE 1 TABLET EVERY DAY 90 tablet 3  . fluticasone  (FLONASE ) 50 MCG/ACT nasal spray PLACE 2 SPRAYS INTO BOTH NOSTRILS DAILY. (Patient taking differently: Place 2 sprays into both nostrils as needed.) 48 g 3  . furosemide  (LASIX ) 20 MG tablet TAKE 2 TABLETS (40 MG TOTAL) BY MOUTH EVERY MONDAY, WEDNESDAY, AND FRIDAY. 72 tablet 3  . latanoprost  (XALATAN ) 0.005 % ophthalmic solution Place 1 drop into both eyes at bedtime.     . loratadine  (CLARITIN ) 10 MG tablet Take 1 tablet (10 mg total) by mouth daily. (Patient taking differently: Take 10 mg  by mouth as needed for allergies.) 30 tablet 11  . losartan  (COZAAR ) 50 MG tablet Take 50 mg by mouth daily.    . Multiple Vitamins-Minerals (OCUVITE ADULT 50+) CAPS Take 1 capsule by mouth daily.    . rosuvastatin  (CRESTOR ) 40 MG tablet TAKE 1 TABLET EVERY DAY (DISCONTINUE PRAVASTATIN ) 90 tablet 3  . sodium bicarbonate 650 MG tablet Take 650 mg by mouth daily.    . timolol  (TIMOPTIC ) 0.5 % ophthalmic solution Place 1 drop into both eyes every morning.     No current facility-administered medications for this visit.    PHYSICAL EXAMINATION: ECOG PERFORMANCE STATUS: 1 - Symptomatic but completely ambulatory VITALS: Vitals:   06/27/24 1223 06/27/24 1231  BP: (!) 156/58 (!) 142/48  Pulse: 70   Resp: 18   Temp: 98.1 F (36.7 C)   SpO2: 99%    Filed Weights   06/27/24  1223  Weight: 161 lb 1.6 oz (73.1 kg)   Body mass index is 23.79 kg/m.  GENERAL: alert, in no acute distress and comfortable SKIN: no acute rashes, no significant lesions EYES: conjunctiva are pink and non-injected, sclera anicteric OROPHARYNX: MMM, no exudates, no oropharyngeal erythema or ulceration NECK: supple, no JVD LYMPH:  no palpable lymphadenopathy in the cervical, axillary or inguinal regions LUNGS: clear to auscultation b/l with normal respiratory effort HEART: regular rate & rhythm ABDOMEN:  normoactive bowel sounds , non tender, not distended, no hepatosplenomegaly Extremity: no pedal edema PSYCH: alert & oriented x 3 with fluent speech NEURO: no focal motor/sensory deficits  LABORATORY DATA:   I have reviewed the data as listed     Latest Ref Rng & Units 05/22/2024   11:28 AM 03/21/2024   12:00 AM 02/14/2024    2:29 PM  CBC EXTENDED  WBC 4.0 - 10.5 K/uL 4.6  5.0  4.5   RBC 4.22 - 5.81 MIL/uL 4.68  4.21  4.38   Hemoglobin 13.0 - 17.0 g/dL 86.6  88.0  87.3   HCT 39.0 - 52.0 % 41.4  38.1  40.1   Platelets 150 - 400 K/uL 194  209  187   NEUT# 1.7 - 7.7 K/uL 2.6  2,720  2,669   Lymph# 0.7 - 4.0 K/uL 1.0          Latest Ref Rng & Units 05/22/2024   11:28 AM 02/14/2024    2:29 PM 09/22/2023   11:55 AM  CMP  Glucose 70 - 99 mg/dL 95  92  95   BUN 8 - 23 mg/dL 32  39  31   Creatinine 0.61 - 1.24 mg/dL 8.48  8.27  8.53   Sodium 135 - 145 mmol/L 140  142  141   Potassium 3.5 - 5.1 mmol/L 5.0  5.1  4.9   Chloride 98 - 111 mmol/L 109  109  107   CO2 22 - 32 mmol/L 27  25  28    Calcium  8.9 - 10.3 mg/dL 89.5  9.7  9.6   Total Protein 6.5 - 8.1 g/dL 8.0     Total Bilirubin 0.0 - 1.2 mg/dL 0.5     Alkaline Phos 38 - 126 U/L 74     AST 15 - 41 U/L 32     ALT 0 - 44 U/L 23        RADIOGRAPHIC STUDIES: I have personally reviewed the radiological images as listed and agreed with the findings in the report. NM PET Image Initial (PI) Skull Base To Thigh Result Date:  06/06/2024  CLINICAL DATA:  Initial treatment strategy for osseous metastasis of unknown primary EXAM: NUCLEAR MEDICINE PET SKULL BASE TO THIGH TECHNIQUE: 7.8 mCi F-18 FDG was injected intravenously. Full-ring PET imaging was performed from the skull base to thigh after the radiotracer. CT data was obtained and used for attenuation correction and anatomic localization. Fasting blood glucose: 96 mg/dl COMPARISON:  CT abdomen pelvis November 11, 2022. FINDINGS: Mediastinal blood pool activity: SUV max 2.3 Liver activity: SUV max 2.8 NECK: Misregistration between PET and CT due to patient's head movement. Right supraclavicular hypermetabolic lymph node measuring 1 cm with max SUV 3.2. A few additional supraclavicular sub 5 mm lymph nodes on the left below PET resolution. CHEST: Diffuse mildly FDG avid ground-glass attenuation and interlobular septal thickening of right lower lobe. The heart size is normal. Trace right pleural effusion. Atherosclerotic calcifications of aorta and coronary arteries. 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 Trace pericardial fluid. Atherosclerotic calcifications of the aorta coronary arteries and aortic annulus. ABDOMEN/PELVIS: Diffuse metabolic activity of the gastric body and antrum with max SUV up to 6 with mild wall thickening without focal lesion. Heterogeneous enlarged prostate with diffuse and heterogeneous foci of metabolic activity max SUV up to 15. Diffuse colonic diverticulosis without diverticulitis. No bowel obstruction. Prostatomegaly. Thick-walled bladder. Small fat containing left inguinal hernia. SKELETON: Degenerative changes of the spine L1 sclerotic changes without significant FDG uptake. Bilateral greater trochanteric bursitis. IMPRESSION: Diffuse wall thickening and FDG uptake of the gastric body and fundus concerning for gastric malignancy (including lymphoma, adenocarcinoma) or suggestive of gastritis. Recommend endoscopy and biopsy  for further assessment. Hypermetabolic right supraclavicular lymph node concerning for metastatic nodal disease [employer] node). Heterogeneous enlarged prostate with multiple foci of FDG uptake. Primary prostate malignancy can not be excluded. Correlate with PSA levels and dedicated MRI prostate imaging. Diffuse pulmonary ground-glass attenuation with interlobular septal thickening with FDG uptake involving the right lower lobe with associated trace right pleural effusion may represent infectious/inflammatory process, versus pulmonary edema in the setting of congestive heart failure. Indeterminate sclerotic lesion of L1 vertebral body without significant FDG uptake above background. Recommend MRI lumbar spine for further assessment. Electronically Signed   By: Megan  Zare M.D.   On: 06/06/2024 19:50    ASSESSMENT & PLAN:  83 y.o. male with  Cancer, metastatic to bone (HCC) Diffuse sclerosis of the L1 vertebral body is suspicious for metastatic involvement on 04/05/2024 CTA. Does have hx of prior injury from 1995 at L4-L5 which causes some lower back pain on standing for extended periods. Only known FHX of cancer is lung cancer in his father, who was also a smoker.   Elevated PSA   PLAN: - Discussed lab results on 06/27/2024 in detail with patient: -Hemoglobin normal, WBC, Platelets normal -Blood chemistry showed mild CKD -Did not show antibodies for multiple myeloma -PSA level was somewhat elevated, at 15.7 -Vitamin D  deficient, replacing Vitamin D  with an OTC supplement -Calcium  levels are high, no need to take calcium     -recommended staying hydrated -nonspecific inflammation found -Recommends seeing Dr. Nieves for elevated PSA levels  -Recommended an endoscopy for thickening of stomach lining, as soon as possible  -Gastroenterologist is Dr. Leigh, will send in referral to him for out patient procedure -Referral to radiology to look at lymph node of upper clavicle  PET Scan  Findings: L1 Spot is not relighting up on the PET Scan, some degenerative changes No obvious findings of other tumors of the bone An  enlarged prostate that is lighting up, may be localized prostate cancer with no obvious lymph nodes around the area. The bladder looks thickened, due to increased pressure caused by enlarged prostate Some thickening of the lining of the stomach, unsure if this is due to inflammation, an ulcer, or a tumor A small lymph node discovered above the right collar bone, this can be evaluated, not as obvious upon physical examination  FOLLOW-UP in {WEEKS/MONTHS:30939} for labs and follow-up with Dr. Onesimo.  The total time spent in the appointment was *** minutes* .  All of the patient's questions were answered and the patient knows to call the clinic with any problems, questions, or concerns.  Emaline Onesimo MD MS AAHIVMS [facility] Hematology/Oncology Physician [facility]  *Total Encounter Time as defined by the Centers for Medicare and Medicaid Services includes, in addition to the face-to-face time of a patient visit (documented in the note above) non-face-to-face time: obtaining and reviewing outside history, ordering and reviewing medications, tests or procedures, care coordination (communications with other health care professionals or caregivers) and documentation in the medical record.  I, Alan Blowers, acting as a neurosurgeon for Emaline Onesimo, MD.,have documented all relevant documentation on the behalf of Emaline Onesimo, MD,as directed by  Emaline Onesimo, MD while in the presence of Emaline Onesimo, MD.  I have reviewed the above documentation for accuracy and completeness, and I agree with the above.  Deshon Hsiao, MD

## 2024-07-06 ENCOUNTER — Encounter (HOSPITAL_COMMUNITY): Payer: Self-pay

## 2024-07-06 NOTE — Progress Notes (Signed)
 David Simmonds, MD  [name] PROCEDURE / BIOPSY REVIEW Date: 07/06/24  Requested Biopsy site: R neck Reason for request: HM LN on PET. No primary DX Imaging review: Best seen on PET CT  Decision: Approved Imaging modality to perform: Ultrasound Schedule with: No sedation / Local anesthetic Schedule for: Any VIR  Additional comments: @Schedulers . US  R supraclavicular LN Bx. Local anesthetic.  Please contact me with questions, concerns, or if issue pertaining to this request arise.  [name] Hughes, MD [facility]       Previous Messages    ----- Message ----- From: [name] Sent: 07/06/2024   7:47 AM EST To: Adir Schicker; Ir Procedure Requests Subject: us  core biopsy ( lymph nodes )                Procedure : US  core biopsy ( lymph nodes)  Reason : FDG avid rt supraclavicular LN biopsy concern for possible malignancy Dx: Supraclavicular adenopathy [R59.0 (ICD-10-CM)]    History : NM PET initial skull base to thigh , CT abd pelv w/o , CT head w/o  Provider : Onesimo Emaline Brink, MD  Contact : [phone]

## 2024-07-10 ENCOUNTER — Encounter (HOSPITAL_COMMUNITY): Payer: Self-pay

## 2024-07-10 NOTE — Progress Notes (Signed)
 Hughes Simmonds, MD  Dominik Yordy [facility] hold required  Thanks, ONA ,MD       Previous Messages    ----- Message ----- From: Afrika Brick Sent: 07/07/2024  12:01 PM EST To: Simmonds Hughes, MD Subject: RE: us  core biopsy ( lymph nodes )            Patient just started Plavix  - will he need to hold off on them ? ----- Message ----- From: Hughes Simmonds, MD Sent: 07/06/2024   8:37 AM EST To: Eleanor Mighty Subject: RE: us  core biopsy ( lymph nodes )            PROCEDURE / BIOPSY REVIEW Date: 07/06/24  Requested Biopsy site: R neck Reason for request: HM LN on PET. No primary DX Imaging review: Best seen on PET CT  Decision: Approved Imaging modality to perform: Ultrasound Schedule with: No sedation / Local anesthetic Schedule for: Any VIR  Additional comments: @Schedulers . US  R supraclavicular LN Bx. Local anesthetic.  Please contact me with questions, concerns, or if issue pertaining to this request arise.  Simmonds Hughes, MD [facility] ----- Message ----- From: Kacee Sukhu Sent: 07/06/2024   7:47 AM EST To: Maureen Duesing; Ir Procedure Requests Subject: us  core biopsy ( lymph nodes )                Procedure : US  core biopsy ( lymph nodes)  Reason : FDG avid rt supraclavicular LN biopsy concern for possible malignancy Dx: Supraclavicular adenopathy [R59.0 (ICD-10-CM)]    History : NM PET initial skull base to thigh , CT abd pelv w/o , CT head w/o  Provider : Onesimo Emaline Brink, MD  Contact : [phone]

## 2024-07-15 ENCOUNTER — Telehealth: Payer: Self-pay

## 2024-07-15 NOTE — Telephone Encounter (Signed)
 Patient was identified as falling into the [place] Measure - Diabetes.   Patient was: Appointment already scheduled for:  08/14/24.

## 2024-07-24 ENCOUNTER — Telehealth: Payer: Self-pay | Admitting: Cardiology

## 2024-07-24 NOTE — Telephone Encounter (Signed)
 Hi. This pt came in to drop off a mini letter. I've placed it in your mailbox. Thank you.

## 2024-07-26 ENCOUNTER — Telehealth: Payer: Self-pay

## 2024-07-26 ENCOUNTER — Other Ambulatory Visit: Payer: Self-pay

## 2024-07-26 DIAGNOSIS — I350 Nonrheumatic aortic (valve) stenosis: Secondary | ICD-10-CM

## 2024-07-26 DIAGNOSIS — R948 Abnormal results of function studies of other organs and systems: Secondary | ICD-10-CM

## 2024-07-26 NOTE — Telephone Encounter (Signed)
-----   Message from Glendia Holt, MD sent at 07/25/2024  6:44 AM EST ----- I would prefer to get him in sooner than February given concern for gastric malignancy.  As there is only a few weeks left, I would recommend booking with Dr. Federico.  Dr. 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  Has severe aortic stenosis which I think warrants a hospital slot.  Would you be okay with doing his procedure?  My next available appointment isn't until mid February.  Thanks ----- Message ----- From: Marcello Alan CROME, CMA Sent: 07/24/2024   8:19 AM EST To: Glendia FORBES Holt, MD  Good morning Dr. Holt,   You do have an opening on 2/17. Would that be soon enough? Or Dr. Federico is the only other opening in January. On 1/12. ----- Message ----- From: Holt Glendia FORBES, MD Sent: 07/22/2024   2:37 PM EST To: Alan CROME Marcello, CMA  Alan,  This patient needs an EGD for suspected gastric malignancy.  He has severe AS, so I think he should be done in the hospital.  I don't think I have anything available in January or February.  Can you see if any other providers have any hospital openings in the next few weeks?  Thanks ----- Message ----- From: Leigh Elspeth SQUIBB, MD Sent: 07/09/2024   8:09 PM EST To: Emaline Candida Saran, MD; Glendia FORBES Cunningham,#  Hi Dr. Saran,  I was out on vacation and just seeing this.  Yes I think we can help with EGD. Looks like this patient follows with Glendia Holt, I am adding him to this string.  Scott - FYI - looks like this patient needs an EGD. Thanks ----- Message ----- From: Saran Emaline Candida, MD Sent: 07/06/2024  12:22 AM EST To: Elspeth SQUIBB Leigh, MD; Jackalyn Lento

## 2024-07-26 NOTE — Telephone Encounter (Signed)
 Called and spoke with patient and patient's daughter that patient needs to be scheduled for EGD at the hospital for suspected gastric malignancy based on his PET scan. Patient's daughter and patient verbalized understanding. Informed patient that Dr. Stacia did not have any availability at the hospital until February but his partner Dr. Federico has a opening sooner on 08/14/24. Offered to schedule patient sooner. Patient's daughter and patient verbalized understanding. Scheduled patient for EGD at [facility] on 08/14/24 at 9:20 am, arriving at 7:50 am with Luke at scheduling, booking # [phone]. Spoke with patient and informed him I will mail him instructions and to contact our office with any questions or concerns. Also, informed patient I am sending a clearance letter to Dr. Ladona for his Plavix  and I will reach out to him when I get a response. Patient verbalized understanding.

## 2024-07-26 NOTE — Telephone Encounter (Signed)
" °  [name] Apr 17, 1941 [id number]     Dear Dr. Ladona:  We have scheduled the above named patient for a(n) EGD procedure. Our records show that (s)he is on anticoagulation therapy.  Please advise as to whether the patient may come off their therapy of Plavix  5 days prior to their procedure which is scheduled for 08/14/24.  Please route your response to Alan Fusi, CMA or fax response to [phone].  Sincerely,    [facility]   "

## 2024-07-26 NOTE — Telephone Encounter (Signed)
 Faxed clearance to Dr. Godfrey office at [phone].

## 2024-07-28 NOTE — Telephone Encounter (Signed)
 Re-faxed Plavix  clearance to Dr. Godfrey office.

## 2024-08-01 ENCOUNTER — Ambulatory Visit (HOSPITAL_COMMUNITY)
Admission: RE | Admit: 2024-08-01 | Discharge: 2024-08-01 | Disposition: A | Discharge status: Home / Self Care | Source: Ambulatory Visit | Attending: Hematology | Admitting: Hematology

## 2024-08-01 ENCOUNTER — Encounter (HOSPITAL_COMMUNITY): Payer: Self-pay | Admitting: Internal Medicine

## 2024-08-01 DIAGNOSIS — C7951 Secondary malignant neoplasm of bone: Secondary | ICD-10-CM | POA: Insufficient documentation

## 2024-08-01 DIAGNOSIS — R590 Localized enlarged lymph nodes: Secondary | ICD-10-CM | POA: Insufficient documentation

## 2024-08-01 MED ORDER — LIDOCAINE-EPINEPHRINE (PF) 2 %-1:200000 IJ SOLN
INTRAMUSCULAR | Status: AC
  Filled 2024-08-01: qty 20

## 2024-08-01 NOTE — Progress Notes (Signed)
 Attempted to obtain medical history for pre op call via telephone, unable to reach at this time. HIPAA compliant voicemail message left requesting return call to pre surgical testing department.

## 2024-08-03 LAB — SURGICAL PATHOLOGY

## 2024-08-04 NOTE — Telephone Encounter (Signed)
 Proceed with the procedure with low risk, hold Plavix  for 5 days and restart when stable from GI standpoint. I am not at the above #, I am now with [facility]

## 2024-08-04 NOTE — Telephone Encounter (Signed)
 Left message for patient to call office.

## 2024-08-07 ENCOUNTER — Telehealth: Payer: Self-pay | Admitting: Gastroenterology

## 2024-08-07 NOTE — Telephone Encounter (Addendum)
 Procedure:Endoscopy Procedure date: 08/14/24 Procedure location: [facility] Arrival Time: 7:45 am Spoke with the patient Y/N: Yes Any prep concerns? No  Has the patient obtained the prep from the pharmacy ? No prep needed Do you have a care partner and transportation: Yes Any additional concerns? No

## 2024-08-07 NOTE — Telephone Encounter (Signed)
 Informed patient he can hold his Plavix  5 days prior to his procedure on 08/14/24. Patient verbalized understanding.

## 2024-08-08 ENCOUNTER — Telehealth: Payer: Self-pay | Admitting: Cardiology

## 2024-08-08 ENCOUNTER — Telehealth (HOSPITAL_BASED_OUTPATIENT_CLINIC_OR_DEPARTMENT_OTHER): Payer: Self-pay | Admitting: *Deleted

## 2024-08-08 NOTE — Telephone Encounter (Signed)
 Pt c/o medication issue:  1. Name of Medication:   clopidogrel  (PLAVIX ) 75 MG tablet    2. How are you currently taking this medication (dosage and times per day)? Unknown   3. Are you having a reaction (difficulty breathing--STAT)? No   4. What is your medication issue? Pt's daughter states pt became sick after taking medication. She stated she advised issue through note in office in December. Please advise

## 2024-08-08 NOTE — Telephone Encounter (Signed)
 SABRA

## 2024-08-08 NOTE — Telephone Encounter (Signed)
 I have already sent a message regarding holding Plavix  before GI evaluation.  I also tried to call Annabella, went to voicemail.  She was probably not aware that I was away during the holidays.

## 2024-08-08 NOTE — Telephone Encounter (Signed)
 Dr. Godfrey CMA Ted asked if I could look at the [name]'s chart with her in regard to a preop request letter sent to Dr. Ladona.   I reviewed the chart and did not see an official preop request faxed over to the preop team for the cardiologist. I then reached out to Alan HERO, CMA with the GI office and did review our preop protocol for the cardiologist. I did ask going forward as FYI to please not send a letter directly to the MD's as the letter may be missed which may cause a delay in our preop clearance for the [name]. Asked that faxes come to [phone] preop team. Alan, CMA verbalized understanding to protocol.

## 2024-08-08 NOTE — Telephone Encounter (Signed)
 Spoke with [name]. [name] states she dropped off a note but never received an answer. Stopped taking plavix  shortly after dropping off note. [name] states pts breathing changed and felt nauseous while on medication. Since stopping Pt has not had either issue. Pt has procedure at end of the month in which they stated he would need to hold his plavix . Advised I would send over to Dr Ladona for next steps.

## 2024-08-14 ENCOUNTER — Ambulatory Visit (HOSPITAL_COMMUNITY)
Admission: RE | Admit: 2024-08-14 | Discharge: 2024-08-14 | Disposition: A | Discharge status: Home / Self Care | Attending: Internal Medicine | Admitting: Internal Medicine

## 2024-08-14 ENCOUNTER — Other Ambulatory Visit: Payer: Self-pay | Admitting: Sports Medicine

## 2024-08-14 ENCOUNTER — Encounter (HOSPITAL_COMMUNITY)
Admission: RE | Disposition: A | Discharge status: Home / Self Care | Payer: Self-pay | Source: Home / Self Care | Attending: Internal Medicine

## 2024-08-14 ENCOUNTER — Ambulatory Visit (HOSPITAL_COMMUNITY): Admitting: Anesthesiology

## 2024-08-14 ENCOUNTER — Other Ambulatory Visit: Payer: Self-pay

## 2024-08-14 ENCOUNTER — Encounter (HOSPITAL_COMMUNITY): Payer: Self-pay | Admitting: Internal Medicine

## 2024-08-14 ENCOUNTER — Ambulatory Visit: Admitting: Nurse Practitioner

## 2024-08-14 DIAGNOSIS — I130 Hypertensive heart and chronic kidney disease with heart failure and stage 1 through stage 4 chronic kidney disease, or unspecified chronic kidney disease: Secondary | ICD-10-CM | POA: Insufficient documentation

## 2024-08-14 DIAGNOSIS — I739 Peripheral vascular disease, unspecified: Secondary | ICD-10-CM | POA: Diagnosis not present

## 2024-08-14 DIAGNOSIS — Z79899 Other long term (current) drug therapy: Secondary | ICD-10-CM | POA: Insufficient documentation

## 2024-08-14 DIAGNOSIS — Z87891 Personal history of nicotine dependence: Secondary | ICD-10-CM | POA: Diagnosis not present

## 2024-08-14 DIAGNOSIS — I10 Essential (primary) hypertension: Secondary | ICD-10-CM | POA: Diagnosis not present

## 2024-08-14 DIAGNOSIS — F32A Depression, unspecified: Secondary | ICD-10-CM | POA: Diagnosis not present

## 2024-08-14 DIAGNOSIS — L853 Xerosis cutis: Secondary | ICD-10-CM

## 2024-08-14 DIAGNOSIS — I5032 Chronic diastolic (congestive) heart failure: Secondary | ICD-10-CM | POA: Diagnosis not present

## 2024-08-14 DIAGNOSIS — N183 Chronic kidney disease, stage 3 unspecified: Secondary | ICD-10-CM | POA: Diagnosis not present

## 2024-08-14 DIAGNOSIS — K3189 Other diseases of stomach and duodenum: Secondary | ICD-10-CM | POA: Insufficient documentation

## 2024-08-14 DIAGNOSIS — K2980 Duodenitis without bleeding: Secondary | ICD-10-CM | POA: Diagnosis not present

## 2024-08-14 DIAGNOSIS — K317 Polyp of stomach and duodenum: Secondary | ICD-10-CM | POA: Diagnosis not present

## 2024-08-14 DIAGNOSIS — K2970 Gastritis, unspecified, without bleeding: Secondary | ICD-10-CM

## 2024-08-14 DIAGNOSIS — R948 Abnormal results of function studies of other organs and systems: Secondary | ICD-10-CM

## 2024-08-14 DIAGNOSIS — K2950 Unspecified chronic gastritis without bleeding: Secondary | ICD-10-CM | POA: Diagnosis not present

## 2024-08-14 DIAGNOSIS — R933 Abnormal findings on diagnostic imaging of other parts of digestive tract: Secondary | ICD-10-CM | POA: Diagnosis present

## 2024-08-14 DIAGNOSIS — I350 Nonrheumatic aortic (valve) stenosis: Secondary | ICD-10-CM

## 2024-08-14 HISTORY — PX: ESOPHAGOGASTRODUODENOSCOPY: SHX5428

## 2024-08-14 MED ORDER — PANTOPRAZOLE SODIUM 20 MG PO TBEC: 20.0000 mg | DELAYED_RELEASE_TABLET | Freq: Every day | ORAL | 1 refills | Status: DC

## 2024-08-14 MED ORDER — SODIUM CHLORIDE 0.9 % IV SOLN: INTRAVENOUS | Status: DC

## 2024-08-14 MED ORDER — SODIUM CHLORIDE 0.9 % IV SOLN: INTRAVENOUS | Status: DC | PRN

## 2024-08-14 MED ORDER — PROPOFOL 500 MG/50ML IV EMUL
INTRAVENOUS | Status: DC | PRN
  Administered 2024-08-14: 140 ug/kg/min via INTRAVENOUS

## 2024-08-14 MED ORDER — PROPOFOL 10 MG/ML IV BOLUS
INTRAVENOUS | Status: DC | PRN
  Administered 2024-08-14: 20 mg via INTRAVENOUS

## 2024-08-14 MED ORDER — PHENYLEPHRINE 80 MCG/ML (10ML) SYRINGE FOR IV PUSH (FOR BLOOD PRESSURE SUPPORT)
PREFILLED_SYRINGE | INTRAVENOUS | Status: DC | PRN
  Administered 2024-08-14: 240 ug via INTRAVENOUS
  Administered 2024-08-14: 160 ug via INTRAVENOUS
  Administered 2024-08-14: 240 ug via INTRAVENOUS

## 2024-08-14 MED ORDER — PROPOFOL 1000 MG/100ML IV EMUL
INTRAVENOUS | Status: AC
  Filled 2024-08-14: qty 100

## 2024-08-14 NOTE — Op Note (Addendum)
 [facility] Patient Name: [name] Procedure Date: 08/14/2024 MRN: [id number] Attending MD: Rosario Estefana Kidney , , [id number] Date of Birth: 04-Oct-1940 CSN: [id number] Age: 84 Admit Type: Ambulatory Procedure:                Upper GI endoscopy Indications:              Abnormal PET scan of the GI tract Providers:                Rosario Estefana Kidney Darleene Zachary, RN, [place], Technician Referring MD:             Emaline Candida Saran Medicines:                Monitored Anesthesia Care Complications:            No immediate complications. Estimated Blood Loss:     Estimated blood loss was minimal. Procedure:                Pre-Anesthesia Assessment:                           - Prior to the procedure, a History and Physical                            was performed, and patient medications and                            allergies were reviewed. The patient's tolerance of                            previous anesthesia was also reviewed. The risks                            and benefits of the procedure and the sedation                            options and risks were discussed with the patient.                            All questions were answered, and informed consent                            was obtained. Prior Anticoagulants: The patient has                            taken Plavix  (clopidogrel ), last dose was 4 days                            prior to procedure. ASA Grade Assessment: III - A                            patient with severe systemic disease. After  reviewing the risks and benefits, the patient was                            deemed in satisfactory condition to undergo the                            procedure.                           After obtaining informed consent, the endoscope was                            passed under direct vision. Throughout the                             procedure, the patient's blood pressure, pulse, and                            oxygen saturations were monitored continuously. The                            GIF-H190 ([id number]) Olympus endoscope was introduced                            through the mouth, and advanced to the second part                            of duodenum. The upper GI endoscopy was                            accomplished without difficulty. The patient                            tolerated the procedure well. Scope In: Scope Out: Findings:      The examined esophagus was normal.      Localized mild inflammation characterized by congestion (edema),       erosions and erythema was found in the cardia and in the gastric antrum.       Biopsies were taken with a cold forceps for histology.      A single 3 mm sessile polyp with no bleeding and no stigmata of recent       bleeding was found in the gastric fundus. The polyp was removed with a       cold biopsy forceps. Resection and retrieval were complete.      Localized inflammation characterized by congestion (edema), erosions and       erythema was found in the duodenal bulb. Impression:               - Normal esophagus.                           - Gastritis. Biopsied.                           - A single gastric polyp. Resected and retrieved.                           -  Duodenitis. Moderate Sedation:      Not Applicable - Patient had care per Anesthesia. Recommendation:           - Discharge patient to home (with escort).                           - Await pathology results.                           - Okay to restart your Plavix  tomorrow.                           - Start pantoprazole  20 mg daily to complete an 8                            week course to help with healing of gastritis and                            duodenitis.                           - Will let Dr. Onesimo know about the results of                            today's exam so that he can decide on next  steps.                           - The findings and recommendations were discussed                            with the patient. Procedure Code(s):        --- Professional ---                           [phone], Esophagogastroduodenoscopy, flexible,                            transoral; with biopsy, single or multiple Diagnosis Code(s):        --- Professional ---                           K29.70, Gastritis, unspecified, without bleeding                           K31.7, Polyp of stomach and duodenum                           K29.80, Duodenitis without bleeding                           R93.3, Abnormal findings on diagnostic imaging of                            other parts of digestive tract CPT copyright 2022 American Medical Association. All rights reserved. The codes documented in this report are preliminary and upon coder review may  be revised to meet current compliance requirements. Dr Estefana Federico Rosario Estefana Federico,  08/14/2024 10:09:58 AM Number of Addenda: 0

## 2024-08-14 NOTE — H&P (Signed)
 "   GASTROENTEROLOGY PROCEDURE H&P NOTE   Primary Care Physician: Sherlynn Madden, MD    Reason for Procedure:   Diffuse gastric wall thickening and hypermetabolic right supraclavicular LN on PET CT  Plan:    EGD  Patient is appropriate for endoscopic procedure(s) in the ambulatory (hospital) setting.  The nature of the procedure, as well as the risks, benefits, and alternatives were carefully and thoroughly reviewed with the patient. Ample time for discussion and questions allowed. The patient understood, was satisfied, and agreed to proceed.     HPI: [name] is a 84 y.o. male who presents for EGD for diffuse gastric wall thickening and hypermetabolic right supraclavicular LN on PET CT.  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)    Pure hypercholesterolemia    Unspecified vitamin D  deficiency     Past Surgical History:  Procedure Laterality Date   CATARACT EXTRACTION     COLONOSCOPY WITH PROPOFOL  N/A 11/12/2022   Procedure: COLONOSCOPY WITH PROPOFOL ;  Surgeon: Leigh Elspeth SQUIBB, MD;  Location: [facility] ENDOSCOPY;  Service: Gastroenterology;  Laterality: N/A;   EYE SURGERY     left eye,    cornea repair   HOT HEMOSTASIS N/A 11/12/2022   Procedure: HOT HEMOSTASIS (ARGON PLASMA COAGULATION/BICAP);  Surgeon: Leigh Elspeth SQUIBB, MD;  Location: [facility] ENDOSCOPY;  Service: Gastroenterology;  Laterality: N/A;   PERIPHERAL VASCULAR CATHETERIZATION N/A 07/28/2016   Procedure: Lower Extremity  Angiography;  Surgeon: Gordy Bergamo, MD;  Location: [facility] INVASIVE CV LAB;  Service: Cardiovascular;  Laterality: N/A;    Prior to Admission medications  Medication Sig Start Date End Date Taking? Authorizing Provider  amLODipine  (NORVASC ) 5 MG tablet Take 1.5 tablets (7.5 mg total) by mouth daily. Daughter states that nephrology recommended him to take 7.5 mg daily 03/21/24  Yes Sherlynn Madden, MD  clobetasol  (TEMOVATE ) 0.05 % GEL Apply 1 Application topically as needed.   Yes [provider]  clopidogrel  (PLAVIX ) 75 MG tablet Take 1 tablet (75 mg total) by mouth daily. 06/26/24  Yes Bergamo Gordy, MD  dapagliflozin  propanediol (FARXIGA ) 10 MG TABS tablet Take 1 tablet (10 mg total) by mouth daily before breakfast. 05/26/24  Yes Sherlynn Madden, MD  ezetimibe  (ZETIA ) 10 MG tablet TAKE 1 TABLET EVERY DAY 05/08/24  Yes Sherlynn Madden, MD  fluticasone  (FLONASE ) 50 MCG/ACT nasal spray PLACE 2 SPRAYS INTO BOTH NOSTRILS DAILY. Patient taking differently: Place 2 sprays into both nostrils as needed. 02/16/24  Yes Sherlynn Madden, MD  furosemide  (LASIX ) 20 MG tablet TAKE 2 TABLETS (40 MG TOTAL) BY MOUTH EVERY MONDAY, WEDNESDAY, AND FRIDAY. 02/28/24  Yes Sherlynn Madden, MD  latanoprost  (XALATAN ) 0.005 % ophthalmic solution Place 1 drop into both eyes at bedtime.  12/18/15  Yes [provider]  loratadine  (CLARITIN ) 10 MG tablet Take 1 tablet (10 mg total) by mouth daily. Patient taking differently: Take 10 mg by mouth as needed for allergies. 09/22/23  Yes Sherlynn Madden, MD  losartan  (COZAAR ) 50 MG tablet Take  50 mg by mouth daily.   Yes [provider]  Multiple Vitamins-Minerals (OCUVITE ADULT 50+) CAPS Take 1 capsule by mouth daily.   Yes [provider]  rosuvastatin  (CRESTOR ) 40 MG tablet TAKE 1 TABLET EVERY DAY (DISCONTINUE PRAVASTATIN ) 05/24/24  Yes Ladona Heinz, MD  sodium bicarbonate 650 MG tablet Take 650 mg by mouth daily. 02/07/24  Yes  [provider]  timolol  (TIMOPTIC ) 0.5 % ophthalmic solution Place 1 drop into both eyes every morning. 12/30/23  Yes [provider]    Current Facility-Administered Medications  Medication Dose Route Frequency Provider Last Rate Last Admin   0.9 %  sodium chloride  infusion   Intravenous Continuous Stacia Glendia BRAVO, MD        Allergies as of 07/26/2024 - Review Complete 06/26/2024  Allergen Reaction Noted   Penicillins Other (See Comments) 03/06/2011    Family History  Problem Relation Age of Onset   Cancer Father     Social History   Socioeconomic History   Marital status: Divorced    Spouse name: Not on file   Number of children: 2   Years of education: Not on file   Highest education level: Not on file  Occupational History   Not on file  Tobacco Use   Smoking status: Former    Current packs/day: 0.00    Average packs/day: 0.5 packs/day for 13.0 years (6.5 ttl pk-yrs)    Types: Cigarettes    Start date: 03/08/1958    Quit date: 03/09/1971    Years since quitting: 53.4   Smokeless tobacco: Never  Vaping Use   Vaping status: Never Used  Substance and Sexual Activity   Alcohol use: No    Alcohol/week: 0.0 standard drinks of alcohol   Drug use: Never   Sexual activity: Not on file  Other Topics Concern   Not on file  Social History Narrative   Not on file   Social Drivers of Health   Tobacco Use: Medium Risk (08/14/2024)   Patient History    Smoking Tobacco Use: Former    Smokeless Tobacco Use: Never    Passive Exposure: Not on Actuary Strain: Not on file  Food Insecurity: No Food Insecurity (05/22/2024)   Epic    Worried About Programme Researcher, Broadcasting/film/video in the Last Year: Never true    Ran Out of Food in the Last Year: Never true  Transportation Needs: No Transportation Needs (05/22/2024)   Epic    Lack of Transportation (Medical): No    Lack of Transportation (Non-Medical): No  Physical Activity: Not on file  Stress: Not on  file  Social Connections: Socially Isolated (11/23/2023)   Social Connection and Isolation Panel    Frequency of Communication with Friends and Family: Three times a week    Frequency of Social Gatherings with Friends and Family: Three times a week    Attends Religious Services: Never    Active Member of Clubs or Organizations: No    Attends Banker Meetings: Never    Marital Status: Divorced  Catering Manager Violence: Not At Risk (05/22/2024)   Epic    Fear of Current or Ex-Partner: No    Emotionally Abused: No    Physically Abused: No    Sexually Abused: No  Depression (PHQ2-9): Low Risk (05/22/2024)   Depression (PHQ2-9)    PHQ-2 Score: 0  Alcohol Screen: Not on file  Housing: Unknown (05/22/2024)   Epic    Unable to Pay for Housing  in the Last Year: No    Number of Times Moved in the Last Year: Not on file    Homeless in the Last Year: No  Utilities: Not At Risk (05/22/2024)   Epic    Threatened with loss of utilities: No  Health Literacy: Not on file    Physical Exam: Vital signs in last 24 hours: BP (!) 148/76   Pulse 76   Temp 98 F (36.7 C) (Temporal)   Resp 19   Ht 5' 9 (1.753 m)   Wt 72.6 kg   SpO2 99%   BMI 23.63 kg/m  GEN: NAD EYE: Sclerae anicteric ENT: MMM CV: Non-tachycardic Pulm: No increased work of breathing GI: Soft, NT/ND NEURO:  Alert & Oriented   Estefana Kidney, MD [facility]  08/14/2024 8:28 AM  "

## 2024-08-14 NOTE — Transfer of Care (Signed)
 Immediate Anesthesia Transfer of Care Note  Patient: [name]  Procedure(s) Performed: EGD (ESOPHAGOGASTRODUODENOSCOPY)  Patient Location: PACU  Anesthesia Type:MAC  Level of Consciousness: drowsy  Airway & Oxygen Therapy: Patient Spontanous Breathing and Patient connected to face mask oxygen  Post-op Assessment: Report given to RN and Post -op Vital signs reviewed and stable  Post vital signs: Reviewed and stable  Last Vitals:  Vitals Value Taken Time  BP 100/37 08/14/24 10:15  Temp    Pulse 71 08/14/24 10:18  Resp 35 08/14/24 10:18  SpO2 96 % 08/14/24 10:18  Vitals shown include unfiled device data.  Last Pain:  Vitals:   08/14/24 1011  TempSrc:   PainSc: Asleep         Complications: No notable events documented.

## 2024-08-14 NOTE — Anesthesia Postprocedure Evaluation (Signed)
"   Anesthesia Post Note  Patient: [name]  Procedure(s) Performed: EGD (ESOPHAGOGASTRODUODENOSCOPY)     Patient location during evaluation: PACU Anesthesia Type: MAC Level of consciousness: awake and alert Pain management: pain level controlled Vital Signs Assessment: post-procedure vital signs reviewed and stable Respiratory status: spontaneous breathing, nonlabored ventilation, respiratory function stable and patient connected to nasal cannula oxygen Cardiovascular status: stable and blood pressure returned to baseline Postop Assessment: no apparent nausea or vomiting Anesthetic complications: no   No notable events documented.  Last Vitals:  Vitals:   08/14/24 1011 08/14/24 1020  BP: (!) 100/37 (!) 102/41  Pulse: 73 (!) 59  Resp: (!) 31 (!) 29  Temp:    SpO2: 96% 95%    Last Pain:  Vitals:   08/14/24 1020  TempSrc:   PainSc: 0-No pain                 [name]      "

## 2024-08-14 NOTE — Anesthesia Preprocedure Evaluation (Addendum)
"                                    Anesthesia Evaluation  Patient identified by MRN, date of birth, ID band Patient awake    Reviewed: Allergy & Precautions, NPO status , Patient's Chart, lab work & pertinent test results  Airway Mallampati: II  TM Distance: >3 FB Neck ROM: Full    Dental  (+) Missing, Poor Dentition   Pulmonary former smoker   breath sounds clear to auscultation       Cardiovascular hypertension, Pt. on medications + Peripheral Vascular Disease   Rhythm:Regular Rate:Normal     Neuro/Psych  PSYCHIATRIC DISORDERS  Depression    negative neurological ROS     GI/Hepatic negative GI ROS, Neg liver ROS,,,  Endo/Other  negative endocrine ROS    Renal/GU Renal disease     Musculoskeletal negative musculoskeletal ROS (+)    Abdominal   Peds  Hematology  (+) Blood dyscrasia, anemia   Anesthesia Other Findings   Reproductive/Obstetrics                              Anesthesia Physical Anesthesia Plan  ASA: 2  Anesthesia Plan: MAC   Post-op Pain Management: Minimal or no pain anticipated   Induction: Intravenous  PONV Risk Score and Plan: 0 and Propofol  infusion  Airway Management Planned: Natural Airway  Additional Equipment: None  Intra-op Plan:   Post-operative Plan:   Informed Consent: I have reviewed the patients History and Physical, chart, labs and discussed the procedure including the risks, benefits and alternatives for the proposed anesthesia with the patient or authorized representative who has indicated his/her understanding and acceptance.       Plan Discussed with: CRNA  Anesthesia Plan Comments:          Anesthesia Quick Evaluation  "

## 2024-08-14 NOTE — Discharge Instructions (Signed)

## 2024-08-15 ENCOUNTER — Ambulatory Visit: Payer: Self-pay | Admitting: Internal Medicine

## 2024-08-15 LAB — SURGICAL PATHOLOGY

## 2024-08-16 ENCOUNTER — Encounter (HOSPITAL_COMMUNITY): Payer: Self-pay | Admitting: Internal Medicine

## 2024-08-17 ENCOUNTER — Inpatient Hospital Stay: Attending: Hematology | Admitting: Hematology

## 2024-08-17 DIAGNOSIS — C61 Malignant neoplasm of prostate: Secondary | ICD-10-CM

## 2024-08-17 DIAGNOSIS — C7951 Secondary malignant neoplasm of bone: Secondary | ICD-10-CM | POA: Insufficient documentation

## 2024-08-17 DIAGNOSIS — C801 Malignant (primary) neoplasm, unspecified: Secondary | ICD-10-CM | POA: Insufficient documentation

## 2024-08-17 DIAGNOSIS — K3189 Other diseases of stomach and duodenum: Secondary | ICD-10-CM

## 2024-08-17 DIAGNOSIS — N183 Chronic kidney disease, stage 3 unspecified: Secondary | ICD-10-CM | POA: Diagnosis not present

## 2024-08-17 DIAGNOSIS — R590 Localized enlarged lymph nodes: Secondary | ICD-10-CM

## 2024-08-17 DIAGNOSIS — I130 Hypertensive heart and chronic kidney disease with heart failure and stage 1 through stage 4 chronic kidney disease, or unspecified chronic kidney disease: Secondary | ICD-10-CM | POA: Insufficient documentation

## 2024-08-17 DIAGNOSIS — I5032 Chronic diastolic (congestive) heart failure: Secondary | ICD-10-CM | POA: Insufficient documentation

## 2024-08-17 DIAGNOSIS — M899 Disorder of bone, unspecified: Secondary | ICD-10-CM

## 2024-08-17 DIAGNOSIS — Z87891 Personal history of nicotine dependence: Secondary | ICD-10-CM | POA: Diagnosis not present

## 2024-08-17 MED ORDER — DOXYCYCLINE HYCLATE 100 MG PO TABS: 100.0000 mg | ORAL_TABLET | Freq: Two times a day (BID) | ORAL | 0 refills | Status: AC

## 2024-08-17 NOTE — Progress Notes (Signed)
 " HEMATOLOGY ONCOLOGY PROGRESS NOTE  Date of service: 08/17/2024  Patient Care Team: Ngetich, Roxan BROCKS, NP as PCP - General (Family Medicine) Ladona Heinz, MD as PCP - Cardiology (Cardiology) Ladona Heinz, MD as Consulting Physician (Cardiology) Octavia Charleston, MD as Consulting Physician (Ophthalmology) Gerlean Lynwood BIRCH, MD (Inactive) as Consulting Physician (Vascular Surgery) Rosemarie Eather RAMAN, MD as Consulting Physician (Neurology) Alvia Norleen BIRCH, MD as Consulting Physician (Ophthalmology)  CHIEF COMPLAINT/PURPOSE OF CONSULTATION: Follow-up for continued evaluation and management of 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   HISTORY OF PRESENTING ILLNESS: [name] is a wonderful 84 y.o. male who has been referred to us  by Dr. 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 is ambulating with a wheelchair, accompanied by daughter.   He received a CTA 03/24/2024 for an abdominal mass for which he was evaluated by his PCP, Sherlynn Madden, MD on 8/19. It's noted that what prompted the office visit initially is a noticeable spontaneous bruise on his left abdomen.  Denies abdominal pain, new black/bloody stools, change in bowel/urinary habits (Bowel movements 2-3x/ day), pain after eating, or unusual weight loss. Notably his weight does fluctuate, but he does eat well.  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 Bleeding treated with fulguration by argon plasma.  He reports that he is followed by Dr. Nieves, Urologist, annually for prostatomegaly and elevated PSA - in [facility] his last PSA was 14.4 in 2017. Denies ever having a prostate biopsy.   Regarding the lesion found at L1, he mentions that in 1995 he injured his back [place] when he was struck in the spine  in the lower back. He says that he does experience lower back pain if he stands for an extensive period. Denies change in breathing, new cough, other lumps/bumps, or other areas of discomfort. Does endorse weakness in his legs, which he uses a stationary bike for strengthening exercise.   Fhx of Lung Cancer in father with risk factor of being a smoker.   SUMMARY OF ONCOLOGIC HISTORY: Oncology History   No problem history exists.    INTERVAL HISTORY:  [name] is a 84 y.o. 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 Today he is accompanied by his daughter and is ambulating with a wheel chair.   he was last seen by me on 06/27/2024; at the time he mentioned experiencing back pain due to previous work related injury.   Today, he reports some mucus production that contains blood. His daughter endorses that this cough is persistent.   He is allergic to penicillin.   He reports he is eating well and does not report any changes to his weight. He denies any new lumps/bumps, irregular bowel habits, urinary problems, or black/bloody stools.    REVIEW OF SYSTEMS:   10 Point review of systems of done and is negative except as noted above.  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) 12/11/2018   Hyperlipidemia    Hypertension    Hypertensive kidney disease, benign    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)    Pure hypercholesterolemia    Unspecified vitamin D  deficiency     SURGICAL HISTORY Past Surgical History:  Procedure Laterality  Date   CATARACT EXTRACTION     COLONOSCOPY WITH PROPOFOL  N/A 11/12/2022   Procedure: COLONOSCOPY WITH PROPOFOL ;  Surgeon: Leigh Elspeth SQUIBB, MD;  Location: [facility] ENDOSCOPY;  Service: Gastroenterology;  Laterality: N/A;   ESOPHAGOGASTRODUODENOSCOPY N/A 08/14/2024   Procedure: EGD (ESOPHAGOGASTRODUODENOSCOPY);  Surgeon: Federico Rosario BROCKS, MD;  Location: [facility];  Service: Gastroenterology;  Laterality: N/A;   EYE SURGERY     left eye,    cornea repair   HOT HEMOSTASIS N/A 11/12/2022   Procedure: HOT HEMOSTASIS (ARGON PLASMA COAGULATION/BICAP);  Surgeon: Leigh Elspeth SQUIBB, MD;  Location: [facility] ENDOSCOPY;  Service: Gastroenterology;  Laterality: N/A;   PERIPHERAL VASCULAR CATHETERIZATION N/A 07/28/2016   Procedure: Lower Extremity Angiography;  Surgeon: Gordy Bergamo, MD;  Location: [facility] INVASIVE CV LAB;  Service: Cardiovascular;  Laterality: N/A;    SOCIAL HISTORY Social History[1]  Social History   Social History Narrative   Not on file    SOCIAL DRIVERS OF HEALTH SDOH Screenings   Food Insecurity: No Food Insecurity (05/22/2024)  Housing: Unknown (05/22/2024)  Transportation Needs: No Transportation Needs (05/22/2024)  Utilities: Not At Risk (05/22/2024)  Depression (PHQ2-9): Low Risk (05/22/2024)  Social Connections: Socially Isolated (11/23/2023)  Tobacco Use: Medium Risk (08/14/2024)     FAMILY HISTORY Family History  Problem Relation Age of Onset   Cancer Father      ALLERGIES: is allergic to penicillins.  MEDICATIONS  Current Outpatient Medications  Medication Sig Dispense Refill   doxycycline  (VIBRA -TABS) 100 MG tablet Take 1 tablet (100 mg total) by mouth 2 (two) times daily for 7 days. 14 tablet 0   amLODipine  (NORVASC ) 5 MG tablet Take 1.5 tablets (7.5 mg total) by mouth daily. Daughter states that nephrology recommended him to take 7.5 mg daily     clobetasol  (TEMOVATE ) 0.05 % GEL Apply 1 Application topically 2 (two) times daily. L85.3 30 g 1   clopidogrel  (PLAVIX )  75 MG tablet Take 1 tablet (75 mg total) by mouth daily. 90 tablet 3   dapagliflozin  propanediol (FARXIGA ) 10 MG TABS tablet Take 1 tablet (10 mg total) by mouth daily before breakfast. 90 tablet 1   ezetimibe  (ZETIA ) 10 MG tablet TAKE 1 TABLET EVERY DAY 90 tablet 3   fluticasone  (FLONASE ) 50 MCG/ACT nasal spray PLACE 2 SPRAYS INTO BOTH NOSTRILS DAILY. (Patient taking differently: Place 2 sprays into both nostrils as needed.) 48 g 3   furosemide  (LASIX ) 20 MG tablet TAKE 2 TABLETS (40 MG TOTAL) BY MOUTH EVERY MONDAY, WEDNESDAY, AND FRIDAY. 72 tablet 3   latanoprost  (XALATAN ) 0.005 % ophthalmic solution Place 1 drop into both eyes at bedtime.      loratadine  (CLARITIN ) 10 MG tablet Take 1 tablet (10 mg total) by mouth daily. (Patient taking differently: Take 10 mg by mouth as needed for allergies.) 30 tablet 11   losartan  (COZAAR ) 50 MG tablet Take 50 mg by mouth daily.     Multiple Vitamins-Minerals (OCUVITE ADULT 50+) CAPS Take 1 capsule by mouth daily.     ondansetron  (ZOFRAN -ODT) 4 MG disintegrating tablet Take 1 tablet (4 mg total) by mouth every 6 (six) hours as needed for nausea or vomiting. 20 tablet  0   pantoprazole  (PROTONIX ) 20 MG tablet Take 1 tablet (20 mg total) by mouth daily. 30 tablet 1   rosuvastatin  (CRESTOR ) 40 MG tablet TAKE 1 TABLET EVERY DAY (DISCONTINUE PRAVASTATIN ) 90 tablet 3   sodium bicarbonate 650 MG tablet Take 650 mg by mouth daily.     timolol  (TIMOPTIC ) 0.5 % ophthalmic solution Place 1 drop into both eyes every morning.     No current facility-administered medications for this visit.    PHYSICAL EXAMINATION: ECOG PERFORMANCE STATUS: 1 - Symptomatic but completely ambulatory VITALS: VSS GENERAL: alert, in no acute distress and comfortable SKIN: no acute rashes, no significant lesions EYES: conjunctiva are pink and non-injected, sclera anicteric OROPHARYNX: MMM, no exudates, no oropharyngeal erythema or ulceration NECK: supple, no JVD LYMPH:  no palpable  lymphadenopathy in the cervical, axillary or inguinal regions LUNGS: clear to auscultation b/l with normal respiratory effort HEART: regular rate & rhythm ABDOMEN:  normoactive bowel sounds , non tender, not distended, no hepatosplenomegaly Extremity: no pedal edema PSYCH: alert & oriented x 3 with fluent speech NEURO: no focal motor/sensory deficits  LABORATORY DATA:   I have reviewed the data as listed     Latest Ref Rng & Units 05/22/2024   11:28 AM 03/21/2024   12:00 AM 02/14/2024    2:29 PM  CBC EXTENDED  WBC 4.0 - 10.5 K/uL 4.6  5.0  4.5   RBC 4.22 - 5.81 MIL/uL 4.68  4.21  4.38   Hemoglobin 13.0 - 17.0 g/dL 86.6  88.0  87.3   HCT 39.0 - 52.0 % 41.4  38.1  40.1   Platelets 150 - 400 K/uL 194  209  187   NEUT# 1.7 - 7.7 K/uL 2.6  2,720  2,669   Lymph# 0.7 - 4.0 K/uL 1.0          Latest Ref Rng & Units 05/22/2024   11:28 AM 02/14/2024    2:29 PM 09/22/2023   11:55 AM  CMP  Glucose 70 - 99 mg/dL 95  92  95   BUN 8 - 23 mg/dL 32  39  31   Creatinine 0.61 - 1.24 mg/dL 8.48  8.27  8.53   Sodium 135 - 145 mmol/L 140  142  141   Potassium 3.5 - 5.1 mmol/L 5.0  5.1  4.9   Chloride 98 - 111 mmol/L 109  109  107   CO2 22 - 32 mmol/L 27  25  28    Calcium  8.9 - 10.3 mg/dL 89.5  9.7  9.6   Total Protein 6.5 - 8.1 g/dL 8.0     Total Bilirubin 0.0 - 1.2 mg/dL 0.5     Alkaline Phos 38 - 126 U/L 74     AST 15 - 41 U/L 32     ALT 0 - 44 U/L 23      SURGICAL PATHOLOGY 08/14/2024   SURGICAL PATHOLOGY CASE: [id number] PATIENT: [name] Surgical Pathology Report     Clinical History: Suspected gastric cancer, abnormal PET scan, severe aortic stenosis, R/O adenoma, R/O H Pylori (las)     FINAL MICROSCOPIC DIAGNOSIS:  A.   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 No H. pylori identified on HE stain. Negative for intestinal metaplasia, dysplasia or carcinoma.  B.   GASTRIC, POLYPECTOMY: Xanthoma. Focal foveolar  hyperplasia. No H. pylori identified on HE stain. Negative for intestinal metaplasia, dysplasia or carcinoma.   GROSS DESCRIPTION:  A. Received in formalin labeled with the patients name  and Gastric bx is a 1.1 x 1.0 x 0.2 cm aggregate of tan soft tissue fragments, submitted in toto in a single cassette.  B. Received in formalin labeled with the patients name and Gastric polyp are four 0.2-0.5 cm pieces of tan soft tissue, submitted in toto in a single cassette.  (LEF 08/14/2024)   Final Diagnosis performed by Pepper Dutton, MD.   Electronically signed 08/15/2024 Technical component performed at [facility], [address], [place], [place] [address].  Professional component performed at [employer]. [facility], [address], [place], [place] [address].  Immunohistochemistry Technical component (if applicable) was performed at [facility]. [address], [place], [place] [address].   IMMUNOHISTOCHEMISTRY DISCLAIMER (if applicable): Some of these immunohistochemical stains may have been developed and the performance characteristics determine by [facility]. Some may not have been cleared or approved by the U.S. Food and Drug Administration. The FDA has determined that such clearance or approval is not necessary. This test is used for clinical purposes. It should not be regarded as investigational or for research. This laboratory is certified under the Clinical Laboratory Improvement Amendments of 1988 (CLIA-88) as qualified to perform high complexity clinical laboratory testing.  The controls stained appropriately.   IHC stains are performed on formalin fixed, paraffin embedded tissue using a 3,3diaminobenzidine (DAB) chromogen and Leica Bond Autostainer System. The staining intensity of the nucleus is score manually and is reported as the percentage of tumor cell nuclei demonstrating specific nuclear  staining. The specimens are fixed in 10% Neutral Formalin for at least 6 hours and up to 72hrs. These tests are validated on decalcified tissue. Results should be interpreted with caution given the possibility of false negative results on decalcified specimens. Antibody Clones are as follows ER-clone 15F, PR-clone 16, Ki67- clone MM1. Some of these immunohistochemical stains may have been developed and the performance characteristics determined by [facility] Pathology.    RADIOGRAPHIC STUDIES: I have personally reviewed the radiological images as listed and agreed with the findings in the report. US  CORE BIOPSY (LYMPH NODES) Result Date: 08/01/2024 INDICATION: 84 year old male with history of bone metastases and right supraclavicular lymphadenopathy. EXAM: Ultrasound-guided right supraclavicular lymph node biopsy MEDICATIONS: None. ANESTHESIA/SEDATION: None. FLUOROSCOPY TIME:  None. COMPLICATIONS: None immediate. PROCEDURE: Informed written consent was obtained from the patient after a thorough discussion of the procedural risks, benefits and alternatives. All questions were addressed. Maximal Sterile Barrier Technique was utilized including caps, mask, sterile gowns, sterile gloves, sterile drape, hand hygiene and skin antiseptic. A timeout was performed prior to the initiation of the procedure. Preprocedure ultrasound evaluation of the right supraclavicular region demonstrated a heterogeneously hypoechoic ovoid lymph node measuring up to proximally 0.7 cm in short axis. The procedure was planned. Subdermal Local anesthesia was administered at the planned needle entry site with 1% lidocaine . Under direct ultrasound visualization, deeper local anesthetic was administered to the periphery of the lymph node. A small skin nick was made. Under direct ultrasound visualization, a 17 gauge coaxial introducer needle was directed to the periphery of the lymph node. This was followed by acquisition of 3, 18 gauge  core biopsies which were placed in formalin. The needle was removed. Postprocedure ultrasound evaluation demonstrated no evidence of surrounding hematoma or other complicating features. The patient tolerated the procedure well and was discharged home in good condition. IMPRESSION: Technically successful right supraclavicular lymph node core biopsy. Ester Sides, MD [facility] Electronically Signed  By: Ester Sides M.D.   On: 08/01/2024 14:32   NM PET Image Initial (PI) Skull Base To Thigh Result Date: 06/06/2024 CLINICAL DATA:  Initial treatment strategy for osseous metastasis of unknown primary EXAM: NUCLEAR MEDICINE PET SKULL BASE TO THIGH TECHNIQUE: 7.8 mCi F-18 FDG was injected intravenously. Full-ring PET imaging was performed from the skull base to thigh after the radiotracer. CT data was obtained and used for attenuation correction and anatomic localization. Fasting blood glucose: 96 mg/dl COMPARISON:  CT abdomen pelvis November 11, 2022. FINDINGS: Mediastinal blood pool activity: SUV max 2.3 Liver activity: SUV max 2.8 NECK: Misregistration between PET and CT due to patient's head movement. Right supraclavicular hypermetabolic lymph node measuring 1 cm with max SUV 3.2. A few additional supraclavicular sub 5 mm lymph nodes on the left below PET resolution. CHEST: Diffuse mildly FDG avid ground-glass attenuation and interlobular septal thickening of right lower lobe. The heart size is normal. Trace right pleural effusion. Atherosclerotic calcifications of aorta and coronary arteries. 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 Trace pericardial fluid. Atherosclerotic calcifications of the aorta coronary arteries and aortic annulus. ABDOMEN/PELVIS: Diffuse metabolic activity of the gastric body and antrum with max SUV up to 6 with mild wall thickening without focal lesion. Heterogeneous enlarged prostate with diffuse  and heterogeneous foci of metabolic activity max SUV up to 15. Diffuse colonic diverticulosis without diverticulitis. No bowel obstruction. Prostatomegaly. Thick-walled bladder. Small fat containing left inguinal hernia. SKELETON: Degenerative changes of the spine L1 sclerotic changes without significant FDG uptake. Bilateral greater trochanteric bursitis. IMPRESSION: Diffuse wall thickening and FDG uptake of the gastric body and fundus concerning for gastric malignancy (including lymphoma, adenocarcinoma) or suggestive of gastritis. Recommend endoscopy and biopsy for further assessment. Hypermetabolic right supraclavicular lymph node concerning for metastatic nodal disease [employer] node). Heterogeneous enlarged prostate with multiple foci of FDG uptake. Primary prostate malignancy can not be excluded. Correlate with PSA levels and dedicated MRI prostate imaging. Diffuse pulmonary ground-glass attenuation with interlobular septal thickening with FDG uptake involving the right lower lobe with associated trace right pleural effusion may represent infectious/inflammatory process, versus pulmonary edema in the setting of congestive heart failure. Indeterminate sclerotic lesion of L1 vertebral body without significant FDG uptake above background. Recommend MRI lumbar spine for further assessment. Electronically Signed   By: Megan  Zare M.D.   On: 06/06/2024 19:50    ASSESSMENT & PLAN:  84 y.o. male with  1. L1 lesion -- not FDG avid on PET Diffuse sclerosis of the L1 vertebral body is suspicious for metastatic involvement on 04/05/2024 CTA. Does have hx of prior injury from 1995 at L4-L5 which causes some lower back pain on standing for extended periods. Only known FHX of cancer is lung cancer in his father, who was also a smoker.   2. Elevated PSA pet shows heterogeneously enlarged prostate with  multiple foci of FDG uptake -- concerning for prostate cancer   3. DIfuse gastric thickening and  hypermetabolic rt supraclavicular LN - concerning for possible gastric malignancy    PLAN: - Discussed lab results on 08/17/2024 in detail with patient: -PSA levels were elevated at 15 -endoscopy did not show any findings of cancer, but inflammation of the stomach -negative for H Pylori -instructed pt to follow-up with Dr. Eskridge/urology due to concern for prostate cancer. -recommends less coffee and less spicy food -Left supraclavicular lymph node did not show signs of cancer on Biopsy, benign -will prescribe antibiotics to take care  of his persistent cough, potential bronchitis -if the infection is viral, recommends using steam to loosen up the mucus -recommends following up with PCP in the next few weeks  F/u with Dr Nieves ASAP to evaluate for prostate cancer RTC with Dr Onesimo in 6 months with labs  FOLLOW-UP in 6 months for labs and follow-up with Dr. Onesimo.  The total time spent in the appointment was 30 minutes* .  All of the patient's questions were answered and the patient knows to call the clinic with any problems, questions, or concerns.  Emaline Onesimo MD MS AAHIVMS [facility] Hematology/Oncology Physician [facility]  *Total Encounter Time as defined by the Centers for Medicare and Medicaid Services includes, in addition to the face-to-face time of a patient visit (documented in the note above) non-face-to-face time: obtaining and reviewing outside history, ordering and reviewing medications, tests or procedures, care coordination (communications with other health care professionals or caregivers) and documentation in the medical record.  I, Alan Blowers, acting as a neurosurgeon for Emaline Onesimo, MD.,have documented all relevant documentation on the behalf of Emaline Onesimo, MD,as directed by  Emaline Onesimo, MD while in the presence of Emaline Onesimo, MD.  I have reviewed the above documentation for accuracy and completeness, and I agree with the above.  Emaline Onesimo, MD      [1]  Social History Tobacco Use   Smoking status: Former    Current packs/day: 0.00    Average packs/day: 0.5 packs/day for 13.0 years (6.5 ttl pk-yrs)    Types: Cigarettes    Start date: 03/08/1958    Quit date: 03/09/1971    Years since quitting: 53.4   Smokeless tobacco: Never  Vaping Use   Vaping status: Never Used  Substance Use Topics   Alcohol use: No    Alcohol/week: 0.0 standard drinks of alcohol   Drug use: Never   "

## 2024-08-18 ENCOUNTER — Other Ambulatory Visit: Payer: Self-pay

## 2024-08-18 DIAGNOSIS — C61 Malignant neoplasm of prostate: Secondary | ICD-10-CM

## 2024-08-18 DIAGNOSIS — M899 Disorder of bone, unspecified: Secondary | ICD-10-CM

## 2024-08-18 MED ORDER — ONDANSETRON 4 MG PO TBDP: 4.0000 mg | ORAL_TABLET | Freq: Four times a day (QID) | ORAL | 0 refills | Status: DC | PRN

## 2024-08-23 ENCOUNTER — Telehealth: Payer: Self-pay

## 2024-08-23 NOTE — Telephone Encounter (Signed)
 Upper Endoscopy pathology results letter mailed to home address.

## 2024-08-25 ENCOUNTER — Encounter: Admitting: Nurse Practitioner

## 2024-08-25 NOTE — Progress Notes (Signed)
 This encounter was created in error - please disregard.

## 2024-08-29 ENCOUNTER — Encounter: Payer: Self-pay | Admitting: Family

## 2024-08-29 ENCOUNTER — Ambulatory Visit: Admitting: Family

## 2024-08-29 VITALS — BP 126/72 | HR 69 | Temp 97.4°F | Ht 69.0 in | Wt 151.2 lb

## 2024-08-29 DIAGNOSIS — N184 Chronic kidney disease, stage 4 (severe): Secondary | ICD-10-CM | POA: Diagnosis not present

## 2024-08-29 DIAGNOSIS — D631 Anemia in chronic kidney disease: Secondary | ICD-10-CM

## 2024-08-29 DIAGNOSIS — E559 Vitamin D deficiency, unspecified: Secondary | ICD-10-CM

## 2024-08-29 DIAGNOSIS — I5032 Chronic diastolic (congestive) heart failure: Secondary | ICD-10-CM | POA: Diagnosis not present

## 2024-08-29 DIAGNOSIS — H6123 Impacted cerumen, bilateral: Secondary | ICD-10-CM

## 2024-08-29 DIAGNOSIS — D509 Iron deficiency anemia, unspecified: Secondary | ICD-10-CM

## 2024-08-29 DIAGNOSIS — E7800 Pure hypercholesterolemia, unspecified: Secondary | ICD-10-CM

## 2024-08-29 DIAGNOSIS — I69351 Hemiplegia and hemiparesis following cerebral infarction affecting right dominant side: Secondary | ICD-10-CM | POA: Diagnosis not present

## 2024-08-29 DIAGNOSIS — B372 Candidiasis of skin and nail: Secondary | ICD-10-CM

## 2024-08-29 DIAGNOSIS — N1832 Chronic kidney disease, stage 3b: Secondary | ICD-10-CM | POA: Diagnosis not present

## 2024-08-29 DIAGNOSIS — I10 Essential (primary) hypertension: Secondary | ICD-10-CM

## 2024-08-29 MED ORDER — FUROSEMIDE 20 MG PO TABS: 40.0000 mg | ORAL_TABLET | ORAL | 3 refills | Status: AC

## 2024-08-29 MED ORDER — DEBROX 6.5 % OT SOLN: 5.0000 [drp] | Freq: Two times a day (BID) | OTIC | 0 refills | Status: AC

## 2024-08-29 MED ORDER — PANTOPRAZOLE SODIUM 20 MG PO TBEC: 20.0000 mg | DELAYED_RELEASE_TABLET | Freq: Every day | ORAL | 1 refills | Status: AC

## 2024-08-29 MED ORDER — AMLODIPINE BESYLATE 5 MG PO TABS: 7.5000 mg | ORAL_TABLET | Freq: Every day | ORAL | 1 refills | Status: AC

## 2024-08-29 MED ORDER — EZETIMIBE 10 MG PO TABS: 10.0000 mg | ORAL_TABLET | Freq: Every day | ORAL | 3 refills | Status: AC

## 2024-08-29 MED ORDER — NYSTATIN 100000 UNIT/GM EX CREA: 1.0000 | TOPICAL_CREAM | Freq: Two times a day (BID) | CUTANEOUS | 0 refills | Status: AC

## 2024-08-29 MED ORDER — DAPAGLIFLOZIN PROPANEDIOL 10 MG PO TABS: 10.0000 mg | ORAL_TABLET | Freq: Every day | ORAL | 1 refills | Status: AC

## 2024-08-29 MED ORDER — LOSARTAN POTASSIUM 50 MG PO TABS: 50.0000 mg | ORAL_TABLET | Freq: Every day | ORAL | 1 refills | Status: AC

## 2024-08-29 NOTE — Progress Notes (Signed)
 "  Provider: Bartt Gonzaga FNP-C   Tregan Read, Roxan BROCKS, NP  Patient Care Team: Kelyn Ponciano, Roxan BROCKS, NP as PCP - General (Family Medicine) Ladona Heinz, MD as PCP - Cardiology (Cardiology) Ladona Heinz, MD as Consulting Physician (Cardiology) Octavia Charleston, MD as Consulting Physician (Ophthalmology) Gerlean Lynwood BIRCH, MD (Inactive) as Consulting Physician (Vascular Surgery) Rosemarie Eather RAMAN, MD as Consulting Physician (Neurology) Alvia Norleen BIRCH, MD as Consulting Physician (Ophthalmology)  Extended Emergency Contact Information Primary Emergency Contact: Person,[name] Home Phone: [phone] Mobile Phone: [phone] Relation: Daughter Secondary Emergency Contact: Con Derrek Morita, [place] Home Phone: [phone] Relation: Daughter  Code Status:  Full Code  Goals of care: Advanced Directive information    08/14/2024    8:05 AM  Advanced Directives  Does Patient Have a Medical Advance Directive? No  Would patient like information on creating a medical advance directive? No - Patient declined     Chief Complaint  Patient presents with   Follow-up    History of Present Illness   [name] is an 84 year old male with chronic kidney disease stage 3B and hypertension who presents for a routine follow-up visit.  He has experienced significant weight loss over the past few months, decreasing from 163 pounds in September to 151 pounds currently, despite no changes in eating habits. He consumes two meals a day with snacks, denies diarrhea, nausea, or vomiting, and confirms finishing his meals.  His current medications include amlodipine  7.5 mg daily, Farxiga  10 mg, Zetia  10 mg for cholesterol, furosemide  40 mg on Monday, Wednesday, and Friday, losartan  50 mg daily for blood pressure, rosuvastatin  40 mg daily for cholesterol, sodium bicarbonate 650 mg daily, and vitamin D  supplements. He uses Flonase  and Claritin  as needed, and lantanoprost  and timolol  for his eyes. He has discontinued Plavix  due to side effects and is not using Zofran  currently.  He denies recent falls, swelling in the legs, or trouble swallowing. He uses both hands but favors the left due to a past stroke affecting the right side. He wakes up three to four times a night to use the bathroom.  He has been experiencing a rash between his legs, described as chafing. He has a history of working outside, which previously caused similar issues, but he has not worked outside recently. The rash is not itchy, and he has been using clobetasol  gel.  No history of diabetes or metastatic cancer to the bone, although there was a previous concern that was ruled out after a procedure.  He was started on vitamin D  supplementation after a doctor said his levels were a little low. Daughter does not recall how much vitamin D  supplement he takes but will call office to update.    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)    Pure hypercholesterolemia    Unspecified vitamin D  deficiency    Past Surgical History:  Procedure Laterality Date  CATARACT EXTRACTION     COLONOSCOPY WITH PROPOFOL  N/A 11/12/2022   Procedure: COLONOSCOPY WITH PROPOFOL ;  Surgeon: Leigh Elspeth SQUIBB, MD;  Location: [facility] ENDOSCOPY;  Service: Gastroenterology;  Laterality: N/A;   ESOPHAGOGASTRODUODENOSCOPY N/A 08/14/2024   Procedure: EGD (ESOPHAGOGASTRODUODENOSCOPY);  Surgeon: Federico Rosario BROCKS, MD;  Location: [facility];  Service:  Gastroenterology;  Laterality: N/A;   EYE SURGERY     left eye,    cornea repair   HOT HEMOSTASIS N/A 11/12/2022   Procedure: HOT HEMOSTASIS (ARGON PLASMA COAGULATION/BICAP);  Surgeon: Leigh Elspeth SQUIBB, MD;  Location: [facility] ENDOSCOPY;  Service: Gastroenterology;  Laterality: N/A;   PERIPHERAL VASCULAR CATHETERIZATION N/A 07/28/2016   Procedure: Lower Extremity Angiography;  Surgeon: Gordy Bergamo, MD;  Location: [facility] INVASIVE CV LAB;  Service: Cardiovascular;  Laterality: N/A;    Allergies[1]  Allergies as of 08/29/2024       Reactions   Penicillins Other (See Comments)   Unknown reaction        Medication List        Accurate as of August 29, 2024  1:06 PM. If you have any questions, ask your nurse or doctor.          STOP taking these medications    ondansetron  4 MG disintegrating tablet Commonly known as: ZOFRAN -ODT Stopped by: Kalyani Maeda, NP       TAKE these medications    amLODipine  5 MG tablet Commonly known as: NORVASC  Take 1.5 tablets (7.5 mg total) by mouth daily. Daughter states that nephrology recommended him to take 7.5 mg dailyTake 1.5 tablets (7.5 mg total) by mouth daily. Daughter states that nephrology recommended him to take 7.5 mg daily What changed: additional instructions Changed by: Alam Guterrez, NP   clobetasol  0.05 % Gel Commonly known as: TEMOVATE  Apply 1 Application topically 2 (two) times daily. L85.3   clopidogrel  75 MG tablet Commonly known as: PLAVIX  Take 1 tablet (75 mg total) by mouth daily.   dapagliflozin  propanediol 10 MG Tabs tablet Commonly known as: Farxiga  Take 1 tablet (10 mg total) by mouth daily before breakfast.   Debrox 6.5 % OTIC solution Generic drug: carbamide peroxide Place 5 drops into both ears 2 (two) times daily for 4 days. Started by: Roxan Plough, NP   ezetimibe  10 MG tablet Commonly known as: ZETIA  Take 1 tablet (10 mg total) by mouth daily.   fluticasone  50 MCG/ACT nasal spray Commonly known as:  FLONASE  PLACE 2 SPRAYS INTO BOTH NOSTRILS DAILY.   furosemide  20 MG tablet Commonly known as: LASIX  Take 2 tablets (40 mg total) by mouth every Monday, Wednesday, and Friday. Start taking on: August 30, 2024   latanoprost  0.005 % ophthalmic solution Commonly known as: XALATAN  Place 1 drop into both eyes at bedtime.   loratadine  10 MG tablet Commonly known as: CLARITIN  Take 1 tablet (10 mg total) by mouth daily. What changed:  when to take this reasons to take this   losartan  50 MG tablet Commonly known as: COZAAR  Take 1 tablet (50 mg total) by mouth daily.   nystatin  cream Commonly known as: MYCOSTATIN  Apply 1 Application topically 2 (two) times daily. Apply to affected areas on groin/inner thigh and left thigh area. Started by: Roxan Plough, NP   Ocuvite Adult 50+ Caps Take 1 capsule by mouth daily.   pantoprazole  20 MG tablet Commonly known as: Protonix  Take 1 tablet (20 mg total) by mouth daily.   rosuvastatin  40 MG tablet Commonly known as: CRESTOR  TAKE 1 TABLET EVERY DAY (DISCONTINUE PRAVASTATIN )  sodium bicarbonate 650 MG tablet Take 650 mg by mouth daily.   timolol  0.5 % ophthalmic solution Commonly known as: TIMOPTIC  Place 1 drop into both eyes every morning.        Review of Systems  Constitutional:  Negative for appetite change, chills, fatigue, fever and unexpected weight change.  HENT:  Negative for congestion, ear discharge, ear pain, hearing loss, nosebleeds, postnasal drip, rhinorrhea, sinus pressure, sinus pain, sneezing, sore throat, tinnitus and trouble swallowing.   Eyes:  Negative for pain, discharge, redness, itching and visual disturbance.  Respiratory:  Negative for cough, chest tightness, shortness of breath and wheezing.   Cardiovascular:  Negative for chest pain, palpitations and leg swelling.  Gastrointestinal:  Negative for abdominal distention, abdominal pain, blood in stool, constipation, diarrhea, nausea and vomiting.   Endocrine: Negative for cold intolerance, heat intolerance, polydipsia, polyphagia and polyuria.  Genitourinary:  Negative for difficulty urinating, dysuria, flank pain, frequency and urgency.       Voids 3 -4 times during the night  Musculoskeletal:  Negative for arthralgias, back pain, gait problem, joint swelling, myalgias, neck pain and neck stiffness.  Skin:  Positive for rash. Negative for color change, pallor and wound.       Groin areas and left  upper thigh   Neurological:  Negative for dizziness, syncope, speech difficulty, weakness, light-headedness and headaches.       Right side weakness   Hematological:  Does not bruise/bleed easily.  Psychiatric/Behavioral:  Negative for agitation, behavioral problems, confusion, hallucinations and sleep disturbance. The patient is not nervous/anxious.     Immunization History  Administered Date(s) Administered   Fluad Quad(high Dose 65+) 04/03/2019, 07/22/2020, 05/06/2021, 05/12/2022   Fluad Trivalent(High Dose 65+) 05/11/2023   INFLUENZA, HIGH DOSE SEASONAL PF 07/22/2017, 04/18/2018, 05/16/2024   Influenza Split 04/29/2010   Influenza,inj,Quad PF,6+ Mos 06/05/2013, 07/09/2014, 06/14/2015   PFIZER Comirnaty (Gray Top)Covid-19 Tri-Sucrose Vaccine 10/04/2020   PFIZER(Purple Top)SARS-COV-2 Vaccination 03/02/2020, 04/02/2020   Pfizer(Comirnaty )Fall Seasonal Vaccine 12 years and older 07/24/2022, 06/29/2023   Pneumococcal Conjugate-13 10/02/2013   Pneumococcal Polysaccharide-23 11/05/2011   Tdap 04/25/2019   Pertinent  Health Maintenance Due  Topic Date Due   Influenza Vaccine  Completed      01/12/2024   10:44 AM 01/12/2024   10:58 AM 04/18/2024   11:24 AM 05/16/2024   11:24 AM 08/29/2024   11:38 AM  Fall Risk  Falls in the past year? 1 0 0 0 0  Was there an injury with Fall? 1  1  1   0  0  Fall Risk Category Calculator 2 1 1   0  Patient at Risk for Falls Due to History of fall(s)   No Fall Risks No Fall Risks  Fall risk Follow up  Falls evaluation completed   Falls evaluation completed Falls evaluation completed     Data saved with a previous flowsheet row definition   Functional Status Survey:    Vitals:   08/29/24 1142  BP: 126/72  Pulse: 69  Temp: (!) 97.4 F (36.3 C)  SpO2: 98%  Weight: 151 lb 3.2 oz (68.6 kg)  Height: 5' 9 (1.753 m)   Body mass index is 22.33 kg/m. Physical Exam Physical Exam   MEASUREMENTS: Weight- 151 lbs. GENERAL: Alert, cooperative, well developed, no acute distress. HEENT: Normocephalic, normal oropharynx, moist mucous membranes, cerumen impaction obscuring eardrum, nose normal, no sinus tenderness. NECK: Neck normal. CHEST: Clear to auscultation bilaterally, no wheezes, rhonchi, or crackles. CARDIOVASCULAR: Normal heart rate and rhythm, S1 and S2  normal without murmurs. ABDOMEN: Soft, non-tender, non-distended, without organomegaly, normal bowel sounds. EXTREMITIES: No cyanosis or edema. MUSCULOSKELETAL: No spinal tenderness. NEUROLOGICAL: Cranial nerves grossly intact, moves all extremities without gross motor or sensory deficit except right side weakness and unsteady gait  SKIN: Yeast infection in groin and left upper lateral thigh area.      Labs reviewed: Recent Labs    09/22/23 1155 02/14/24 1429 05/22/24 1128  NA 141 142 140  K 4.9 5.1 5.0  CL 107 109 109  CO2 28 25 27   GLUCOSE 95 92 95  BUN 31* 39* 32*  CREATININE 1.46* 1.72* 1.51*  CALCIUM  9.6 9.7 10.4*   Recent Labs    05/22/24 1128  AST 32  ALT 23  ALKPHOS 74  BILITOT 0.5  PROT 8.0  ALBUMIN 4.2   Recent Labs    02/14/24 1429 03/21/24 0000 05/22/24 1128  WBC 4.5 5.0 4.6  NEUTROABS 2,669 2,720 2.6  HGB 12.6* 11.9* 13.3  HCT 40.1 38.1* 41.4  MCV 91.6 90.5 88.5  PLT 187 209 194   Lab Results  Component Value Date   TSH 2.130 05/22/2024   Lab Results  Component Value Date   HGBA1C 11.6 07/14/2022   Lab Results  Component Value Date   CHOL 116 12/23/2022   HDL 55 12/23/2022    LDLCALC 46 12/23/2022   TRIG 70 12/23/2022   CHOLHDL 2.1 12/23/2022    Significant Diagnostic Results in last 30 days:  US  CORE BIOPSY (LYMPH NODES) Result Date: 08/01/2024 INDICATION: 84 year old male with history of bone metastases and right supraclavicular lymphadenopathy. EXAM: Ultrasound-guided right supraclavicular lymph node biopsy MEDICATIONS: None. ANESTHESIA/SEDATION: None. FLUOROSCOPY TIME:  None. COMPLICATIONS: None immediate. PROCEDURE: Informed written consent was obtained from the patient after a thorough discussion of the procedural risks, benefits and alternatives. All questions were addressed. Maximal Sterile Barrier Technique was utilized including caps, mask, sterile gowns, sterile gloves, sterile drape, hand hygiene and skin antiseptic. A timeout was performed prior to the initiation of the procedure. Preprocedure ultrasound evaluation of the right supraclavicular region demonstrated a heterogeneously hypoechoic ovoid lymph node measuring up to proximally 0.7 cm in short axis. The procedure was planned. Subdermal Local anesthesia was administered at the planned needle entry site with 1% lidocaine . Under direct ultrasound visualization, deeper local anesthetic was administered to the periphery of the lymph node. A small skin nick was made. Under direct ultrasound visualization, a 17 gauge coaxial introducer needle was directed to the periphery of the lymph node. This was followed by acquisition of 3, 18 gauge core biopsies which were placed in formalin. The needle was removed. Postprocedure ultrasound evaluation demonstrated no evidence of surrounding hematoma or other complicating features. The patient tolerated the procedure well and was discharged home in good condition. IMPRESSION: Technically successful right supraclavicular lymph node core biopsy. Ester Sides, MD [facility] Rad[facility] Electronically Signed   By: Ester Sides M.D.    On: 08/01/2024 14:32    Assessment/Plan  Unintentional weight loss Weight decreased from 163 lbs in September to 151 lbs currently. No changes in eating habits, no diarrhea, nausea, or vomiting. Differential includes thyroid  dysfunction. - Recommended home weight monitoring with a scale - Ordered thyroid  function tests - Ordered lab work to check for anemia, infection, kidney, liver, electrolytes, and cholesterol  Intertriginous candidiasis Yeast infection in intertriginous areas, likely due to moisture and medication-induced immunosuppression. No itching reported. - Prescribed nystatin  for topical application twice daily until rash resolve  -  Advised against scratching to prevent spread  Impacted cerumen, bilateral Bilateral impacted cerumen with complete occlusion of eardrums, likely exacerbated by Q-tip use. - Recommended Debrox ear drops, 5 drops in each ear twice daily for 4 days - Scheduled follow-up in one week for ear irrigation  Essential hypertension Blood pressure at goal Blood pressure managed with amlodipine  and losartan .  Chronic kidney disease, stage 3b - Latest CR 1.51 previous 1.72 and 1.46  - continue to avoid nephrotoxins and dose all medication for renal clearance   Chronic diastolic heart failure No current symptoms of fluid overload or leg swelling. - on furosemide  three times per week  - continue on knee high compression stockings daily   Pure hypercholesterolemia LDL at goal 46  Cholesterol managed with Zetia  and rosuvastatin .  Vitamin D  deficiency Previously diagnosed with low vitamin D  levels, currently on supplementation. - Ordered vitamin D  level check  Iron deficiency anemia No recent lab work to assess current status. - Ordered lab work to check for anemia  General health maintenance Routine follow-up and monitoring of chronic conditions. - Scheduled six-month follow-up appointment    Family/ staff Communication: Reviewed plan of care  with patient and daughter verbalized understanding   Labs/tests ordered:  - CBC with Differential/Platelet - CMP with eGFR([facility]) - TSH - Lipid panel - Vitamin D  deficiency   Next Appointment : Return in about 6 months (around 02/26/2025) for medical mangement of chronic issues., Bilateral ear lavage in one week .   Spent 30 minutes of Face to face and non-face to face with patient  >50% time spent counseling; reviewing medical record; tests; labs; documentation and developing future plan of care.   Adith Tejada C Oriel Ojo, NP      [1]  Allergies Allergen Reactions   Penicillins Other (See Comments)    Unknown reaction   "

## 2024-08-29 NOTE — Patient Instructions (Signed)
-   check weight daily at home and notify provider for any progressive weight loss

## 2024-08-30 ENCOUNTER — Telehealth: Payer: Self-pay | Admitting: Cardiology

## 2024-08-30 LAB — COMPREHENSIVE METABOLIC PANEL WITH GFR
AG Ratio: 1.2 (calc) (ref 1.0–2.5)
ALT: 17 U/L (ref 9–46)
AST: 23 U/L (ref 10–35)
Albumin: 4 g/dL (ref 3.6–5.1)
Alkaline phosphatase (APISO): 79 U/L (ref 35–144)
BUN/Creatinine Ratio: 25 (calc) — ABNORMAL HIGH (ref 6–22)
BUN: 39 mg/dL — ABNORMAL HIGH (ref 7–25)
CO2: 26 mmol/L (ref 20–32)
Calcium: 10.1 mg/dL (ref 8.6–10.3)
Chloride: 108 mmol/L (ref 98–110)
Creat: 1.53 mg/dL — ABNORMAL HIGH (ref 0.70–1.22)
Globulin: 3.4 g/dL (ref 1.9–3.7)
Glucose, Bld: 91 mg/dL (ref 65–139)
Potassium: 4.4 mmol/L (ref 3.5–5.3)
Sodium: 143 mmol/L (ref 135–146)
Total Bilirubin: 0.6 mg/dL (ref 0.2–1.2)
Total Protein: 7.4 g/dL (ref 6.1–8.1)
eGFR: 45 mL/min/{1.73_m2} — ABNORMAL LOW

## 2024-08-30 LAB — CBC WITH DIFFERENTIAL/PLATELET
Absolute Lymphocytes: 1144 {cells}/uL (ref 850–3900)
Absolute Monocytes: 614 {cells}/uL (ref 200–950)
Basophils Absolute: 21 {cells}/uL (ref 0–200)
Basophils Relative: 0.4 %
Eosinophils Absolute: 239 {cells}/uL (ref 15–500)
Eosinophils Relative: 4.6 %
HCT: 44.2 % (ref 39.4–51.1)
Hemoglobin: 13.7 g/dL (ref 13.2–17.1)
MCH: 28.7 pg (ref 27.0–33.0)
MCHC: 31 g/dL — ABNORMAL LOW (ref 31.6–35.4)
MCV: 92.7 fL (ref 81.4–101.7)
MPV: 11.7 fL (ref 7.5–12.5)
Monocytes Relative: 11.8 %
Neutro Abs: 3182 {cells}/uL (ref 1500–7800)
Neutrophils Relative %: 61.2 %
Platelets: 191 10*3/uL (ref 140–400)
RBC: 4.77 Million/uL (ref 4.20–5.80)
RDW: 14.4 % (ref 11.0–15.0)
Total Lymphocyte: 22 %
WBC: 5.2 10*3/uL (ref 3.8–10.8)

## 2024-08-30 LAB — LIPID PANEL
Cholesterol: 107 mg/dL
HDL: 50 mg/dL
LDL Cholesterol (Calc): 42 mg/dL
Non-HDL Cholesterol (Calc): 57 mg/dL
Total CHOL/HDL Ratio: 2.1 (calc)
Triglycerides: 74 mg/dL

## 2024-08-30 LAB — TSH: TSH: 1.55 m[IU]/L (ref 0.40–4.50)

## 2024-08-30 LAB — VITAMIN D 25 HYDROXY (VIT D DEFICIENCY, FRACTURES): Vit D, 25-Hydroxy: 52 ng/mL (ref 30–100)

## 2024-08-30 NOTE — Telephone Encounter (Signed)
 Please advise patient to start ASA non enteric coated children's aspirin  daily

## 2024-08-30 NOTE — Telephone Encounter (Signed)
 Daughter reports stopped giving dad clopidogrel  in Dec d/t s/e of nausea and feeling weak.  Reports was told someone would call her back with an alternate medication for clopidogrel  but hasn't heard from anyone.   Daughter reports pt feels better since stopping clopidogrel .  Advised will send to MD to advise.

## 2024-08-30 NOTE — Telephone Encounter (Signed)
 Daughter states she would like to speak to the nurse in regards to patient medication. Please advise

## 2024-08-31 MED ORDER — ASPIRIN 81 MG PO TBEC: 81.0000 mg | DELAYED_RELEASE_TABLET | Freq: Every day | ORAL | Status: AC

## 2024-08-31 NOTE — Telephone Encounter (Signed)
 Spoke with patient's goddaughter [name] and shared response from Dr. Ladona:  Please advise patient to start ASA non enteric coated children's aspirin  daily  [name] verbalized understanding and expressed appreciation for call.

## 2024-09-07 ENCOUNTER — Ambulatory Visit: Admitting: Family

## 2024-09-07 ENCOUNTER — Encounter: Payer: Self-pay | Admitting: Family

## 2024-10-05 ENCOUNTER — Encounter (INDEPENDENT_AMBULATORY_CARE_PROVIDER_SITE_OTHER): Admitting: Ophthalmology

## 2024-12-04 ENCOUNTER — Ambulatory Visit (HOSPITAL_COMMUNITY)

## 2025-02-21 ENCOUNTER — Inpatient Hospital Stay: Admitting: Hematology

## 2025-02-21 ENCOUNTER — Inpatient Hospital Stay

## 2025-02-26 ENCOUNTER — Ambulatory Visit: Admitting: Family

## 2025-05-18 ENCOUNTER — Ambulatory Visit: Payer: Self-pay | Admitting: Family
